# Patient Record
Sex: Male | Born: 1940
Health system: Southern US, Community
[De-identification: ages and names within clinical notes are randomized; demographics above are authoritative.]

## PROBLEM LIST (undated history)

## (undated) DIAGNOSIS — I4892 Unspecified atrial flutter: Secondary | ICD-10-CM

## (undated) DIAGNOSIS — I2510 Atherosclerotic heart disease of native coronary artery without angina pectoris: Secondary | ICD-10-CM

## (undated) DIAGNOSIS — K219 Gastro-esophageal reflux disease without esophagitis: Secondary | ICD-10-CM

## (undated) DIAGNOSIS — E785 Hyperlipidemia, unspecified: Secondary | ICD-10-CM

## (undated) DIAGNOSIS — I10 Essential (primary) hypertension: Secondary | ICD-10-CM

## (undated) DIAGNOSIS — R42 Dizziness and giddiness: Secondary | ICD-10-CM

## (undated) DIAGNOSIS — I219 Acute myocardial infarction, unspecified: Secondary | ICD-10-CM

## (undated) DIAGNOSIS — I5022 Chronic systolic (congestive) heart failure: Secondary | ICD-10-CM

## (undated) DIAGNOSIS — I441 Atrioventricular block, second degree: Secondary | ICD-10-CM

## (undated) DIAGNOSIS — E119 Type 2 diabetes mellitus without complications: Secondary | ICD-10-CM

## (undated) DIAGNOSIS — I4891 Unspecified atrial fibrillation: Secondary | ICD-10-CM

## (undated) DIAGNOSIS — Z950 Presence of cardiac pacemaker: Secondary | ICD-10-CM

## (undated) DIAGNOSIS — Z86010 Personal history of colonic polyps: Secondary | ICD-10-CM

## (undated) DIAGNOSIS — M1990 Unspecified osteoarthritis, unspecified site: Secondary | ICD-10-CM

## (undated) HISTORY — DX: Personal history of colonic polyps: Z86.010

## (undated) HISTORY — DX: Type 2 diabetes mellitus without complications: E11.9

## (undated) HISTORY — DX: Unspecified atrial fibrillation: I48.91

## (undated) HISTORY — PX: CORONARY ARTERY BYPASS GRAFT: SHX141

## (undated) HISTORY — PX: COLONOSCOPY: SHX174

## (undated) HISTORY — DX: Presence of cardiac pacemaker: Z95.0

## (undated) HISTORY — DX: Unspecified atrial flutter: I48.92

---

## 1994-04-02 HISTORY — PX: OTHER SURGICAL HISTORY: SHX169

## 2003-06-10 ENCOUNTER — Ambulatory Visit (HOSPITAL_COMMUNITY): Admission: RE | Admit: 2003-06-10 | Discharge: 2003-06-10 | Payer: Self-pay | Admitting: Family Medicine

## 2003-06-24 ENCOUNTER — Ambulatory Visit (HOSPITAL_COMMUNITY): Admission: RE | Admit: 2003-06-24 | Discharge: 2003-06-25 | Payer: Self-pay | Admitting: Neurological Surgery

## 2004-03-14 ENCOUNTER — Ambulatory Visit: Payer: Self-pay | Admitting: Family Medicine

## 2004-04-02 HISTORY — PX: LUMBAR SPINE SURGERY: SHX701

## 2004-05-09 ENCOUNTER — Ambulatory Visit: Payer: Self-pay | Admitting: Family Medicine

## 2004-08-30 ENCOUNTER — Ambulatory Visit: Payer: Self-pay | Admitting: Family Medicine

## 2004-09-25 ENCOUNTER — Ambulatory Visit: Payer: Self-pay | Admitting: Family Medicine

## 2004-10-24 ENCOUNTER — Ambulatory Visit: Payer: Self-pay | Admitting: Family Medicine

## 2004-11-08 ENCOUNTER — Ambulatory Visit: Payer: Self-pay | Admitting: Family Medicine

## 2004-11-22 ENCOUNTER — Ambulatory Visit: Payer: Self-pay | Admitting: Family Medicine

## 2005-02-01 ENCOUNTER — Ambulatory Visit: Payer: Self-pay | Admitting: Internal Medicine

## 2005-02-01 ENCOUNTER — Ambulatory Visit (HOSPITAL_COMMUNITY): Admission: RE | Admit: 2005-02-01 | Discharge: 2005-02-01 | Payer: Self-pay | Admitting: Internal Medicine

## 2005-02-01 ENCOUNTER — Encounter: Payer: Self-pay | Admitting: Internal Medicine

## 2005-02-09 ENCOUNTER — Ambulatory Visit: Payer: Self-pay | Admitting: Family Medicine

## 2005-02-28 ENCOUNTER — Ambulatory Visit: Payer: Self-pay | Admitting: Family Medicine

## 2005-08-02 ENCOUNTER — Ambulatory Visit: Payer: Self-pay | Admitting: Family Medicine

## 2005-11-22 ENCOUNTER — Ambulatory Visit: Payer: Self-pay | Admitting: Family Medicine

## 2005-11-26 ENCOUNTER — Ambulatory Visit: Payer: Self-pay | Admitting: Family Medicine

## 2006-01-26 ENCOUNTER — Ambulatory Visit: Payer: Self-pay | Admitting: Family Medicine

## 2006-02-13 ENCOUNTER — Ambulatory Visit: Payer: Self-pay | Admitting: Physician Assistant

## 2006-06-20 ENCOUNTER — Ambulatory Visit: Payer: Self-pay | Admitting: Family Medicine

## 2007-01-12 ENCOUNTER — Observation Stay (HOSPITAL_COMMUNITY): Admission: EM | Admit: 2007-01-12 | Discharge: 2007-01-12 | Payer: Self-pay | Admitting: Emergency Medicine

## 2007-01-12 ENCOUNTER — Ambulatory Visit: Payer: Self-pay | Admitting: *Deleted

## 2008-04-02 HISTORY — PX: INGUINAL HERNIA REPAIR: SHX194

## 2008-04-02 HISTORY — PX: CATARACT EXTRACTION W/ INTRAOCULAR LENS  IMPLANT, BILATERAL: SHX1307

## 2008-06-30 ENCOUNTER — Ambulatory Visit (HOSPITAL_COMMUNITY): Admission: RE | Admit: 2008-06-30 | Discharge: 2008-06-30 | Payer: Self-pay | Admitting: Ophthalmology

## 2008-07-29 ENCOUNTER — Ambulatory Visit (HOSPITAL_COMMUNITY): Admission: RE | Admit: 2008-07-29 | Discharge: 2008-07-29 | Payer: Self-pay | Admitting: Ophthalmology

## 2009-01-27 ENCOUNTER — Encounter: Admission: RE | Admit: 2009-01-27 | Discharge: 2009-01-27 | Payer: Self-pay | Admitting: Surgery

## 2009-02-01 ENCOUNTER — Ambulatory Visit (HOSPITAL_BASED_OUTPATIENT_CLINIC_OR_DEPARTMENT_OTHER): Admission: RE | Admit: 2009-02-01 | Discharge: 2009-02-01 | Payer: Self-pay | Admitting: Surgery

## 2009-02-01 ENCOUNTER — Encounter (INDEPENDENT_AMBULATORY_CARE_PROVIDER_SITE_OTHER): Payer: Self-pay | Admitting: Surgery

## 2010-07-05 LAB — POCT HEMOGLOBIN-HEMACUE: Hemoglobin: 16.3 g/dL (ref 13.0–17.0)

## 2010-07-06 LAB — BASIC METABOLIC PANEL
BUN: 18 mg/dL (ref 6–23)
Chloride: 108 mEq/L (ref 96–112)
GFR calc non Af Amer: >60 mL/min (ref >60)
Potassium: 4.2 mEq/L (ref 3.5–5.1)
Sodium: 141 mEq/L (ref 135–145)

## 2010-07-13 LAB — HEMOGLOBIN AND HEMATOCRIT, BLOOD
HCT: 45.3 % (ref 39.0–52.0)
Hemoglobin: 15.8 g/dL (ref 13.0–17.0)

## 2010-07-13 LAB — BASIC METABOLIC PANEL
CO2: 28 mEq/L (ref 19–32)
Chloride: 101 mEq/L (ref 96–112)
Creatinine, Ser: 1.05 mg/dL (ref 0.4–1.5)
GFR calc Af Amer: >60 mL/min (ref >60)
Potassium: 4.4 mEq/L (ref 3.5–5.1)

## 2010-08-15 NOTE — H&P (Signed)
[name], Hunt                 ACCOUNT NO.:  [id number]   MEDICAL RECORD NO.:  [id number]          PATIENT TYPE:  EMS   LOCATION:  MAJO                         FACILITY:  [facility]   PHYSICIAN:  Unice Cobble, MD     DATE OF BIRTH:  1940-06-30   DATE OF ADMISSION:  01/12/2007  DATE OF DISCHARGE:                              HISTORY & PHYSICAL   PRIMARY CARE PHYSICIAN:  Delaney Meigs, M.D.   CHIEF COMPLAINT:  Nausea.   HISTORY OF PRESENT ILLNESS:  This is a 70 year old white male with a  history of CABG who presents with nausea.  The patient had an onset of  nausea two hours after eating two hot dogs with lunch.  No symptoms of  chest pain, shortness of breath, presyncope, edema, PND, or orthopnea.  His nausea persisted throughout the day.  Prior to his bypass surgery in  1996, he had similar symptoms of nausea for three days straight.  He was  concerned that this was a similar presentation, and went to the  emergency room.  At the outside hospital, his blood pressure was 191/101  (he had missed his evening medications secondary to his stay in the  emergency room).  His blood pressure at home usually runs in the  160s/90s.  According to the physician at the outside hospital emergency  department, the patient asked for transfer to the [facility]  as this is where he had his bypass surgery done.   PAST MEDICAL HISTORY:  1. Hypertension.  2. Hyperlipidemia.  3. Bypass surgery in 1996 at [facility]  4. Back surgery in 2002.   ALLERGIES:  No known drug allergies.   MEDICATIONS:  1. Amlodipine 5 mg daily.  2. Bisoprolol/hydrochlorothiazide 2.5/6.25 mg daily.  3. Simvastatin 80 mg daily.  4. Aspirin 325 mg daily  5. Fish oil 1000 mg two tablets daily.   SOCIAL HISTORY:  He lives in [place] with his wife.  He is a  Scientist, product/process development.  He quit smoking in 1996.  No alcohol or drugs.  He does  not notice any limitation to his exercise capacity.   FAMILY  HISTORY:  His mother had congestive heart failure.  His father  had a stroke and died of a heart attack. One of is siblings has heart  valve problem.   REVIEW OF SYSTEMS:  He had some mild sweating earlier today while he was  hot in the car.  He has had mild headaches.  He has a cough.  He has  frequency of urine.  Multiple arthralgias and some mild abdominal  cramping, but no frank pain.  Otherwise, his review of systems was found  to be negative except as stated in the HPI.   PHYSICAL EXAMINATION:  VITAL SIGNS:  Afebrile.  Pulse 78, blood pressure  148/89.  He is satting 99% on 2 L.  GENERAL:  He is an overweight white male in no acute distress.  HEENT:  PERRLA. EOMI.  Poor dentition. Oropharynx is without erythema or  exudates.  NECK:  Supple without lymphadenopathy, thyromegaly,  bruits, or jugular  venous distension.  HEART:  Regular rate and rhythm with a normal S1 and S2.  No murmurs,  gallops or rubs.  PMI is nondisplaced.  Pulses are 2+ and equal without  bruits.  LUNGS:  Clear to auscultation bilaterally.  SKIN:  No rashes.  ABDOMEN:  Soft and nontender with normal bowel sounds.  No rebound or  guarding.  Negative hepatosplenomegaly or masses.  EXTREMITIES:  No cyanosis, clubbing or edema.  His feet are cool  bilaterally with excellent pulses.  NEUROLOGIC:  He is alert and oriented x3.  Cranial nerves II-XII grossly  intact.  Strength is 5/5 in all extremities and muscle groups.  He had  normal sensation throughout.  EKG:  Shows a sinus bradycardia with a rate of 51 and a bifascicular  block and left anterior fascicular block.  He has left ventricular  hypertrophy with inferior and lateral T wave inversions.  His T wave  inversions are slightly worse than prior EKGs.   LABORATORY DATA:  White count 6.5 with a hemoglobin of 16.8, platelet  count 165.  His basic metabolic panel has been remarkable with a  creatinine of 0.9.  His troponin is less than 0.05 and CK-MB is  1.6.   ASSESSMENT/PLAN:  This is a 70 year old white male with a history of  coronary artery bypass grafting and presenting with nausea.  This is a  nonspecific symptom but similar to prior to his bypass surgery with  minimal EKG differences.  I will rule out myocardial infarction tonight  with serial enzyme and serial EKGs and if negative, plan on performing  his stress test as an outpatient.  He will be treated for nausea with  Zofran, a GI cocktail and Protonix.  Otherwise, I will continue his home  medications with an increase in his Norvasc for antihypertensive effect.      Unice Cobble, MD  Electronically Signed     ACJ/MEDQ  D:  01/12/2007  T:  01/12/2007  Job:  [id number]

## 2010-08-15 NOTE — Discharge Summary (Signed)
Ricardo Hunt, Ricardo Hunt                 ACCOUNT NO.:  [id number]   MEDICAL RECORD NO.:  [id number]          PATIENT TYPE:  INP   LOCATION:  2032                         FACILITY:  [facility]   PHYSICIAN:  Bevelyn Buckles. Bensimhon, MDDATE OF BIRTH:  1941-03-23   DATE OF ADMISSION:  01/12/2007  DATE OF DISCHARGE:  01/12/2007                         DISCHARGE SUMMARY - REFERRING   DISCHARGE DIAGNOSES:  1. Nausea, questionable angina equivalent.  2. History of bypass surgery 1996, specifics unknown.  3. Hypertension.   Patient is new to our cardiology practice, will be Dr. Gala Romney.   SUMMARY OF HISTORY:  [name] is a 70 year old white male with a history  of bypass surgery who presented with nausea.  This occurred  approximately 2 hours after eating two hotdogs at lunch.  He did not  have any associated chest discomfort, shortness of breath, presyncope,  edema, PND or orthopnea.  His nausea persisted.  He presented to the  emergency room, given his history of 3 days of nausea prior to his  bypass surgery in 1996.  He was initially seen at an outside hospital  and was found to be hypertensive at 191/101.  He had not taken his  evening medications secondary to emergency room visit.  Apparently, the  patient asked for transfer to [facility] as this is where he  had his surgery performed.   His history is notable for hypertension, hyperlipidemia, bypass surgery  in 1996, back surgery in 2002.  Remote tobacco use.   LABORATORY DATA:  Admission H&H was 15.9 and 46.2, normal indices,  platelets 165,000, WBC 6.7, PTT 27, PT 13.2.  Sodium 140, potassium 3.9,  BUN 19, creatinine 0.89, normal LFTs.  CK MBs, relative indexes and  troponins were within normal limits x2.  Fasting lipids showed a total  cholesterol of 170, triglycerides 250, HDL 30, LDL 90.  EKG shows sinus  bradycardia, left axis deviation, left anterior hemiblock, right bundle  branch block.   HOSPITAL COURSE:  The patient was  initially seen in the emergency room  by Dr. Laural Benes.  He was admitted for further evaluation.  By late the  afternoon of the 12th the patient had not had any further nausea, he  denied any chest discomfort and shortness of breath, enzymes were  negative for myocardial infarction.  Dr. Gala Romney felt that his EKG  showed sinus rhythm with inferolateral ST segment abnormality slightly  more prominent than prior EKGs.  He felt that he could be discharged  home with outpatient Myoview this week.   DISPOSITION:  1. Patient is discharged home.  2. Activities and wound care are not applicable.  3. Our office will call him with arrangements for a stress Myoview and      followup appointment.  4. Will follow with Dr. Lysbeth Galas, in [place], as needed.  5. He was given a new prescription for nitroglycerin.  6. He was asked to continue his home medications, which include:      a.     Norvasc 5 mg daily.      b.     Bisoprolol/HCTZ 2.5/6.25  mg daily.      c.     Simvastatin 80 mg nightly.      d.     Aspirin 325 mg daily.      e.     Fish Oil as previously.  7. He is asked to bring all medications to all followup appointments.   DISCHARGE TIME:  35 minutes.      Joellyn Rued, PA-C      Bevelyn Buckles. Bensimhon, MD  Electronically Signed    EW/MEDQ  D:  01/12/2007  T:  01/12/2007  Job:  [id number]   cc:   Delaney Meigs, M.D.  Bevelyn Buckles. Bensimhon, MD

## 2010-08-18 NOTE — Op Note (Signed)
NAME:  Ricardo Hunt, Ricardo Hunt                           ACCOUNT NO.:  [id number]   MEDICAL RECORD NO.:  [id number]                   PATIENT TYPE:  OIB   LOCATION:  3013                                 FACILITY:  [facility]   PHYSICIAN:  Tia Alert, MD                  DATE OF BIRTH:  05/13/1940   DATE OF PROCEDURE:  06/24/2003  DATE OF DISCHARGE:                                 OPERATIVE REPORT   PREOPERATIVE DIAGNOSIS:  Lumbar disk herniation, L3-4, with right L4  radiculopathy and spinal stenosis.   POSTOPERATIVE DIAGNOSIS:  Lumbar disk herniation, L3-4, with right al4  radiculopathy and spinal stenosis.   PROCEDURE:  Lumbar hemilaminectomy, medial facetectomy and foraminotomy, L3-  4 on the right, with microdiskectomy, L3-4 on the right utilizing  microscopic dissection.   SURGEON:  Tia Alert, MD   ASSISTANT:  Reinaldo Meeker, M.D.   ANESTHESIA:  General endotracheal.   COMPLICATIONS:  None apparent.   INDICATIONS FOR PROCEDURE:  Mr. Schoffstall is a 70 year old white male who  presented to the neurosurgery clinic with complaints of intractable right  leg pain in an L4 distribution that went down the anterior thigh into the  shin.  He had tried medical management for quite some time.  He had an MRI  which showed a large herniated disk at L3-4, causing significant central  canal stenosis.  I recommended a lumbar decompression via microdiskectomy at  L3-4 on the right.  He understood the risks, benefits and alternatives and  wished to proceed.   DESCRIPTION OF PROCEDURE:  The patient was taken to the operating room and  after induction of general endotracheal anesthesia, he was rolled in the  prone position on the Wilson frame and all pressure points were padded.  His  lumbar region was prepped with DuraPrep and then draped in the usual sterile  fashion.  1 cc of local anesthesia was injected and then a dorsal midline  incision was made and carried down to the lumbosacral fascia.  The fascia was  opened and the paraspinous musculature was taken down in a subperiosteal  fashion to expose the L3-4 interspace on the right side.  Intraoperative x-  ray confirmed level and then a hemilaminectomy, medial facetectomy and  foraminotomy was performed at L3-4 on the right utilizing high speed  Biomedics drill and the Kerrison punch.  The ligament was opened and removed  to expose the underlying dura and L4 nerve root.  I dissected out to the  medial pedicle wall and followed the nerve root out into the foramen.  I  then was able to identify the disk space.  I coagulated the epidural venous  vasculature and cut this sharply, then brought in the operating microscope  and the procedure was done under the operating microscope utilizing  microscopic dissection to perform a thorough intradiskal diskectomy.  The  annulus was incised and  the diskectomy was performed with the pituitary  rongeurs.  He had a large midline free fragment, which was removed with the  pituitary rongeur.  We then palpated with nerve hooks to assure no more free  fragments or compressive lesions.  The annulus was sunken and the nerve root  was free.  We then irrigated with a copious amount of Bacitracin containing  saline solution, removed the retractor, dried all bleeding points and then  closed the fascia with interrupted #1 Vicryl.  We closed the subcuticular  tissue with 2-0  and 3-0 Vicryl, and closed the skin with Dermabond.  The drapes were  removed.  The patient was awakened from general anesthesia and transported  to the recovery room in stable condition.  At the end of the procedure, all  sponge, needle and instrument counts were correct.                                               Tia Alert, MD    DSJ/MEDQ  D:  06/24/2003  T:  06/25/2003  Job:  [id number]

## 2010-08-18 NOTE — Op Note (Signed)
NAME:  Ricardo Hunt, Ricardo Hunt                 ACCOUNT NO.:  [id number]   MEDICAL RECORD NO.:  [id number]          PATIENT TYPE:  AMB   LOCATION:  DAY                           FACILITY:  [facility]   PHYSICIAN:  R. Roetta Sessions, M.D. DATE OF BIRTH:  [birth year]   DATE OF PROCEDURE:  02/01/2005  DATE OF DISCHARGE:                                 OPERATIVE REPORT   PROCEDURE:  Colonoscopy with ileoscopy and segmental biopsy and stool  sample.   INDICATIONS FOR PROCEDURE:  The patient is a 70 year old gentleman referred  at the courtesy of Dr. Joette Catching for colorectal cancer screening. It  sounds like he had a sigmoidoscopy in Dr. Joyce Copa office several years ago  without significant findings. He never had his entire lower GI tract  evaluated. There is no family history of colorectal neoplasia. [name] does  have a complaint of having three to four loose stools daily without ever  having a formed stool he states over the past year or so. It has worsened a  few years back and before he recalls generally having one formed bowel  movement daily. He has not passed any blood per rectum. He has not lost any  weight. He has not had any abdominal pain. Colonoscopy is now being done in  part for screening, in part to further evaluate the above mentioned bowel  symptoms. This approach has been discussed with the patient at length.  Potential risks, benefits, and alternatives have been reviewed and questions  answered. He is agreeable. Please see documentation in the medical record.   PROCEDURE NOTE:  O2 saturation, blood pressure, pulse, and respirations were  monitored throughout the entire procedure. Conscious sedation with Versed 2  mg IV and Demerol 50 mg IV.   INSTRUMENT:  Olympus video chip system.   FINDINGS:  Digital rectal exam revealed no abnormalities.   ENDOSCOPIC FINDINGS:  Prep was adequate.   Rectum:  Examination of the rectal mucosa including retroflexed view of the  anal verge  revealed no abnormalities.   Colon:  Colonic mucosa was surveyed from the rectosigmoid junction through  the left, transverse, and right colon to the area of the appendiceal  orifice, ileocecal valve, and cecum. These structures were well seen and  photographed for the record. The terminal ileum was intubated to 10 cm. From  this level, the scope was slowly withdrawn, and all previously mentioned  mucosal surfaces were again seen. The patient was noted to have left sided  diverticula. The remainder of the colonic mucosa appeared normal. Distal 10  cm of the terminal ileum appeared entirely normal. Segmental biopsies of the  sigmoid colon and rectal mucosa were taken for histologic study. Also, stool  residue was suctioned out for microbiology studies. The patient tolerated  the procedure well and was reactive to endoscopy.   IMPRESSION:  Normal rectum. Left sided diverticula. The remainder of colonic  mucosa and terminal ileum appeared normal.   RECOMMENDATIONS:  1.  Will follow up on biopsies and check for microscopic colitis and will      follow up on stools  studies from today.  2.  Diverticulosis literature provided to Mr. Dorner.  3.  Further recommendations to follow.      Jonathon Bellows, M.D.  Electronically Signed     RMR/MEDQ  D:  02/01/2005  T:  02/01/2005  Job:  [id number]   cc:   Delaney Meigs, M.D.  Fax: [phone]

## 2011-01-11 LAB — DIFFERENTIAL
Basophils Relative: 0
Eosinophils Relative: 3
Monocytes Absolute: 0.4
Monocytes Relative: 6
Neutro Abs: 5.1

## 2011-01-11 LAB — COMPREHENSIVE METABOLIC PANEL
ALT: 20
AST: 19
Albumin: 3.7
Alkaline Phosphatase: 49
BUN: 19
Chloride: 105
GFR calc Af Amer: >60
Potassium: 3.9
Sodium: 140
Total Bilirubin: 0.7
Total Protein: 6.1

## 2011-01-11 LAB — CBC
HCT: 46.2
Hemoglobin: 15.9
RBC: 4.84
WBC: 6.7

## 2011-01-11 LAB — LIPID PANEL
LDL Cholesterol: 90
VLDL: 50 — ABNORMAL HIGH

## 2011-01-11 LAB — CK TOTAL AND CKMB (NOT AT ARMC)
CK, MB: 1.8
CK, MB: 2
Total CK: 59
Total CK: 64

## 2011-01-11 LAB — APTT: aPTT: 27

## 2011-01-11 LAB — TROPONIN I: Troponin I: 0.01

## 2012-10-03 ENCOUNTER — Emergency Department (HOSPITAL_COMMUNITY): Payer: Medicare Other

## 2012-10-03 ENCOUNTER — Encounter (HOSPITAL_COMMUNITY): Payer: Self-pay | Admitting: *Deleted

## 2012-10-03 ENCOUNTER — Observation Stay (HOSPITAL_COMMUNITY)
Admission: EM | Admit: 2012-10-03 | Discharge: 2012-10-06 | Disposition: A | Discharge status: Home / Self Care | Payer: Medicare Other | Attending: Internal Medicine | Admitting: Internal Medicine

## 2012-10-03 DIAGNOSIS — I452 Bifascicular block: Secondary | ICD-10-CM | POA: Insufficient documentation

## 2012-10-03 DIAGNOSIS — E860 Dehydration: Secondary | ICD-10-CM | POA: Diagnosis present

## 2012-10-03 DIAGNOSIS — R55 Syncope and collapse: Principal | ICD-10-CM | POA: Insufficient documentation

## 2012-10-03 DIAGNOSIS — R799 Abnormal finding of blood chemistry, unspecified: Secondary | ICD-10-CM

## 2012-10-03 DIAGNOSIS — S2239XA Fracture of one rib, unspecified side, initial encounter for closed fracture: Secondary | ICD-10-CM | POA: Insufficient documentation

## 2012-10-03 DIAGNOSIS — I10 Essential (primary) hypertension: Secondary | ICD-10-CM | POA: Insufficient documentation

## 2012-10-03 DIAGNOSIS — R7309 Other abnormal glucose: Secondary | ICD-10-CM | POA: Insufficient documentation

## 2012-10-03 DIAGNOSIS — R739 Hyperglycemia, unspecified: Secondary | ICD-10-CM

## 2012-10-03 DIAGNOSIS — I2510 Atherosclerotic heart disease of native coronary artery without angina pectoris: Secondary | ICD-10-CM | POA: Insufficient documentation

## 2012-10-03 DIAGNOSIS — R222 Localized swelling, mass and lump, trunk: Secondary | ICD-10-CM

## 2012-10-03 DIAGNOSIS — R918 Other nonspecific abnormal finding of lung field: Secondary | ICD-10-CM | POA: Diagnosis present

## 2012-10-03 DIAGNOSIS — E785 Hyperlipidemia, unspecified: Secondary | ICD-10-CM | POA: Insufficient documentation

## 2012-10-03 DIAGNOSIS — S2232XD Fracture of one rib, left side, subsequent encounter for fracture with routine healing: Secondary | ICD-10-CM

## 2012-10-03 DIAGNOSIS — I25119 Atherosclerotic heart disease of native coronary artery with unspecified angina pectoris: Secondary | ICD-10-CM | POA: Diagnosis present

## 2012-10-03 DIAGNOSIS — D696 Thrombocytopenia, unspecified: Secondary | ICD-10-CM | POA: Diagnosis present

## 2012-10-03 DIAGNOSIS — Z951 Presence of aortocoronary bypass graft: Secondary | ICD-10-CM | POA: Insufficient documentation

## 2012-10-03 DIAGNOSIS — J9859 Other diseases of mediastinum, not elsewhere classified: Secondary | ICD-10-CM

## 2012-10-03 DIAGNOSIS — W19XXXA Unspecified fall, initial encounter: Secondary | ICD-10-CM | POA: Insufficient documentation

## 2012-10-03 DIAGNOSIS — I4460 Unspecified fascicular block: Secondary | ICD-10-CM | POA: Insufficient documentation

## 2012-10-03 HISTORY — DX: Hyperlipidemia, unspecified: E78.5

## 2012-10-03 HISTORY — DX: Essential (primary) hypertension: I10

## 2012-10-03 HISTORY — DX: Thrombocytopenia, unspecified: D69.6

## 2012-10-03 HISTORY — DX: Dizziness and giddiness: R42

## 2012-10-03 LAB — CBC WITH DIFFERENTIAL/PLATELET
HCT: 44.4 % (ref 39.0–52.0)
Hemoglobin: 15.5 g/dL (ref 13.0–17.0)
Lymphocytes Relative: 8 % — ABNORMAL LOW (ref 12–46)
MCHC: 34.9 g/dL (ref 30.0–36.0)
MCV: 93.1 fL (ref 78.0–100.0)
Monocytes Absolute: 0.3 10*3/uL (ref 0.1–1.0)
Monocytes Relative: 4 % (ref 3–12)
Neutro Abs: 6.8 10*3/uL (ref 1.7–7.7)
WBC: 7.9 10*3/uL (ref 4.0–10.5)

## 2012-10-03 LAB — BASIC METABOLIC PANEL
BUN: 28 mg/dL — ABNORMAL HIGH (ref 6–23)
CO2: 27 mEq/L (ref 19–32)
Chloride: 104 mEq/L (ref 96–112)
Creatinine, Ser: 0.98 mg/dL (ref 0.50–1.35)
Glucose, Bld: 183 mg/dL — ABNORMAL HIGH (ref 70–99)

## 2012-10-03 MED ORDER — ACETAMINOPHEN 325 MG PO TABS: 650.0000 mg | ORAL_TABLET | Freq: Four times a day (QID) | ORAL | Status: DC | PRN

## 2012-10-03 MED ORDER — ATORVASTATIN CALCIUM 40 MG PO TABS
40.0000 mg | ORAL_TABLET | Freq: Every day | ORAL | Status: DC
  Administered 2012-10-04 – 2012-10-05 (×2): 40 mg via ORAL
  Filled 2012-10-03 (×3): qty 1

## 2012-10-03 MED ORDER — SODIUM CHLORIDE 0.9 % IJ SOLN: 3.0000 mL | Freq: Two times a day (BID) | INTRAMUSCULAR | Status: DC

## 2012-10-03 MED ORDER — AMLODIPINE BESYLATE 5 MG PO TABS
5.0000 mg | ORAL_TABLET | Freq: Every day | ORAL | Status: DC
  Administered 2012-10-03: 5 mg via ORAL
  Filled 2012-10-03 (×2): qty 1

## 2012-10-03 MED ORDER — ENOXAPARIN SODIUM 40 MG/0.4ML ~~LOC~~ SOLN: 40.0000 mg | SUBCUTANEOUS | Status: DC

## 2012-10-03 MED ORDER — ALUM & MAG HYDROXIDE-SIMETH 200-200-20 MG/5ML PO SUSP: 30.0000 mL | Freq: Four times a day (QID) | ORAL | Status: DC | PRN

## 2012-10-03 MED ORDER — IRBESARTAN 150 MG PO TABS
150.0000 mg | ORAL_TABLET | Freq: Every day | ORAL | Status: DC
  Administered 2012-10-03: 150 mg via ORAL
  Filled 2012-10-03 (×2): qty 1

## 2012-10-03 MED ORDER — OXYCODONE HCL 5 MG PO TABS
5.0000 mg | ORAL_TABLET | ORAL | Status: DC | PRN
  Administered 2012-10-03 – 2012-10-06 (×3): 5 mg via ORAL
  Filled 2012-10-03 (×3): qty 1

## 2012-10-03 MED ORDER — HYDRALAZINE HCL 20 MG/ML IJ SOLN
10.0000 mg | Freq: Four times a day (QID) | INTRAMUSCULAR | Status: DC | PRN
  Administered 2012-10-04: 10 mg via INTRAVENOUS
  Filled 2012-10-03: qty 1

## 2012-10-03 MED ORDER — ACETAMINOPHEN 650 MG RE SUPP: 650.0000 mg | Freq: Four times a day (QID) | RECTAL | Status: DC | PRN

## 2012-10-03 MED ORDER — SODIUM CHLORIDE 0.9 % IV SOLN
INTRAVENOUS | Status: DC
  Administered 2012-10-03: 75 mL/h via INTRAVENOUS
  Administered 2012-10-05: 10:00:00 via INTRAVENOUS
  Administered 2012-10-05: 75 mL/h via INTRAVENOUS

## 2012-10-03 MED ORDER — HYDROMORPHONE HCL PF 1 MG/ML IJ SOLN
0.5000 mg | INTRAMUSCULAR | Status: DC | PRN
  Administered 2012-10-04 – 2012-10-06 (×6): 1 mg via INTRAVENOUS
  Filled 2012-10-03 (×6): qty 1

## 2012-10-03 MED ORDER — AMLODIPINE-OLMESARTAN 5-20 MG PO TABS: 1.0000 | ORAL_TABLET | Freq: Every day | ORAL | Status: DC

## 2012-10-03 MED ORDER — ONDANSETRON HCL 4 MG/2ML IJ SOLN
4.0000 mg | Freq: Four times a day (QID) | INTRAMUSCULAR | Status: DC | PRN
  Administered 2012-10-04 – 2012-10-06 (×2): 4 mg via INTRAVENOUS
  Filled 2012-10-03 (×2): qty 2

## 2012-10-03 MED ORDER — IOHEXOL 300 MG/ML  SOLN
75.0000 mL | Freq: Once | INTRAMUSCULAR | Status: AC | PRN
  Administered 2012-10-03: 75 mL via INTRAVENOUS

## 2012-10-03 MED ORDER — ZOLPIDEM TARTRATE 5 MG PO TABS: 5.0000 mg | ORAL_TABLET | Freq: Every evening | ORAL | Status: DC | PRN

## 2012-10-03 MED ORDER — ASPIRIN EC 81 MG PO TBEC
81.0000 mg | DELAYED_RELEASE_TABLET | Freq: Every day | ORAL | Status: DC
  Administered 2012-10-04 – 2012-10-05 (×2): 81 mg via ORAL
  Filled 2012-10-03 (×3): qty 1

## 2012-10-03 MED ORDER — OMEGA-3-ACID ETHYL ESTERS 1 G PO CAPS
2.0000 g | ORAL_CAPSULE | Freq: Every day | ORAL | Status: DC
  Administered 2012-10-04 – 2012-10-05 (×2): 2 g via ORAL
  Filled 2012-10-03 (×3): qty 2

## 2012-10-03 MED ORDER — ONDANSETRON HCL 4 MG PO TABS: 4.0000 mg | ORAL_TABLET | Freq: Four times a day (QID) | ORAL | Status: DC | PRN

## 2012-10-03 NOTE — ED Notes (Signed)
Patient was at work (works inside) and felt dizzy.  Co workers stated that he had some LOC.  Upon EMS arrival placed on the monitor and found him to be in 3rd degree heart block.  Patient was alert at that time.  Stated that he became dizzy while coming down the hall into the room

## 2012-10-03 NOTE — ED Notes (Signed)
Attempted to call report x 1. Was told the receiving nurse will call back in 5 minutes.

## 2012-10-03 NOTE — ED Provider Notes (Signed)
History    CSN: [id number] Arrival date & time 10/03/12  1911  First MD Initiated Contact with Patient 10/03/12 1927     Chief Complaint  Patient presents with  . Loss of Consciousness   (Consider location/radiation/quality/duration/timing/severity/associated sxs/prior Treatment) Patient is a 72 y.o. male presenting with syncope. The history is provided by the patient.  Loss of Consciousness He was at work when he started to feel dizzy and passed out. He states that he was very sweaty but denies chest pain, heaviness, tightness, pressure. He denies nausea or vomiting. He denies dyspnea. He was brought in by EMS who reported that he was initially in complete heart block. He felt better shortly after EMS arrived but did have recurrence of dizziness as he was being wheeled into his room in the ED. He does have history of coronary artery bypass surgery and he is on medications for hypertension and hyperlipidemia. History reviewed. No pertinent past medical history. History reviewed. No pertinent past surgical history. No family history on file. History  Substance Use Topics  . Smoking status: Not on file  . Smokeless tobacco: Not on file  . Alcohol Use: Not on file    Review of Systems  Cardiovascular: Positive for syncope.  All other systems reviewed and are negative.    Allergies  Review of patient's allergies indicates not on file.  Home Medications  No current outpatient prescriptions on file. BP 176/75  Pulse 78  Temp(Src) 98.5 F (36.9 C) (Oral)  Resp 18  Ht 5\' 8"  (1.727 m)  Wt 192 lb (87.091 kg)  BMI 29.2 kg/m2  SpO2 97% Physical Exam  Nursing note and vitals reviewed.  72 year old male, resting comfortably and in no acute distress. Vital signs are significant for hypertension with blood pressure 176/75. Oxygen saturation is 97%, which is normal. Head is normocephalic and atraumatic. PERRLA, EOMI. Oropharynx is clear. Neck is nontender and supple without  adenopathy or JVD. There no carotid bruits.  Back is nontender and there is no CVA tenderness. Lungs are clear without rales, wheezes, or rhonchi. Chest is nontender. Heart has regular rate and rhythm without murmur. Abdomen is soft, flat, nontender without masses or hepatosplenomegaly and peristalsis is normoactive. Extremities have no cyanosis or edema, full range of motion is present. Skin is warm and dry without rash. Neurologic: Mental status is normal, cranial nerves are intact, there are no motor or sensory deficits.  ED Course  Procedures (including critical care time) Results for orders placed during the hospital encounter of 10/03/12  CBC WITH DIFFERENTIAL      Result Value Range   WBC 7.9  4.0 - 10.5 K/uL   RBC 4.77  4.22 - 5.81 MIL/uL   Hemoglobin 15.5  13.0 - 17.0 g/dL   HCT 78.2  95.6 - 21.3 %   MCV 93.1  78.0 - 100.0 fL   MCH 32.5  26.0 - 34.0 pg   MCHC 34.9  30.0 - 36.0 g/dL   RDW 08.6  57.8 - 46.9 %   Platelets 127 (*) 150 - 400 K/uL   Neutrophils Relative % 86 (*) 43 - 77 %   Neutro Abs 6.8  1.7 - 7.7 K/uL   Lymphocytes Relative 8 (*) 12 - 46 %   Lymphs Abs 0.7  0.7 - 4.0 K/uL   Monocytes Relative 4  3 - 12 %   Monocytes Absolute 0.3  0.1 - 1.0 K/uL   Eosinophils Relative 1  0 - 5 %  Eosinophils Absolute 0.1  0.0 - 0.7 K/uL   Basophils Relative 0  0 - 1 %   Basophils Absolute 0.0  0.0 - 0.1 K/uL  BASIC METABOLIC PANEL      Result Value Range   Sodium 138  135 - 145 mEq/L   Potassium 4.2  3.5 - 5.1 mEq/L   Chloride 104  96 - 112 mEq/L   CO2 27  19 - 32 mEq/L   Glucose, Bld 183 (*) 70 - 99 mg/dL   BUN 28 (*) 6 - 23 mg/dL   Creatinine, Ser 1.19  0.50 - 1.35 mg/dL   Calcium 8.8  8.4 - 14.7 mg/dL   GFR calc non Af Amer 80 (*) >90 mL/min   GFR calc Af Amer >90  >90 mL/min  TROPONIN I      Result Value Range   Troponin I <0.30  <0.30 ng/mL   Ct Chest W Contrast  10/03/2012   *RADIOLOGY REPORT*  Clinical Data:  With question medial right upper lobe versus  the right superior mediastinal mass  CT CHEST WITH CONTRAST  Technique:  Multidetector CT imaging of the chest was performed following the standard protocol during bolus administration of intravenous contrast. Sagittal and coronal MPR images reconstructed from axial data set.  Contrast: 75mL OMNIPAQUE IOHEXOL 300 MG/ML  SOLN  Comparison: Chest radiograph 10/03/2012  Findings: Postsurgical changes of CABG. Scattered atherosclerotic calcifications aorta at, coronary arteries and great vessels. Markedly tortuous innominate artery, accounting for abnormal soft tissue density at the right superior mediastinum. No evidence of mediastinal mass or adenopathy. Visualized portion of upper abdomen significant only for a tiny calcified gallstone in the gallbladder. Lungs clear with no evidence of pulmonary mass or infiltrate. No pleural effusion or pneumothorax. Osseous structures unremarkable.  IMPRESSION: No evidence of right upper lobe mass or mediastinal mass/adenopathy. Abnormality identified on chest radiography at the medial right upper lobe/superior mediastinum is due to a markedly tortuous innominate artery. Cholelithiasis.   Original Report Authenticated By: Ulyses Southward, M.D.   Dg Chest Portable 1 View  10/03/2012   *RADIOLOGY REPORT*  Clinical Data:  Loss of consciousness, history hypertension  PORTABLE CHEST - 1 VIEW  Comparison: Portable exam 1949 hours compared to 01/27/2009  Findings: Upper normal heart size post median sternotomy. Atherosclerotic calcification aorta. Pulmonary vascularity normal. New medial right apical versus right superior mediastinal soft tissue mass approximately 5 cm diameter. No definite mass effect upon trachea. Remaining lungs clear. No pleural effusion or pneumothorax.  IMPRESSION: New abnormal soft tissue density at medial right apex question medial right upper lobe versus superior mediastinal mass; CT chest with contrast recommended for further evaluation.   Original Report  Authenticated By: Ulyses Southward, M.D.       Date: 10/03/2012  Rate: 77  Rhythm: normal sinus rhythm  QRS Axis: normal  Intervals: PR prolonged  ST/T Wave abnormalities: nonspecific T wave changes  Conduction Disutrbances:right bundle branch block and left anterior fascicular block  Narrative Interpretation: Right bundle branch block, left anterior fascicular block, left ventricular hypertrophy with secondary repolarization changes. When compared with ECG of 01/27/2009, no significant changes are seen.  Old EKG Reviewed: unchanged   1. Syncope   2. Mediastinal mass   3. Hypertension   4. Hyperglycemia   5. Elevated BUN   6. Lung mass   7. Dehydration   8. CAD (coronary artery disease)   9. Hyperlipidemia   10. Thrombocytopenia     MDM  Syncope with transient episode of third  degree AV block. I reviewed her ECGs from EMS and on 2 tracings there appeared to be third degree AV block but all other tracings were in the sinus. There is strips were not timed. However, he does have bifascicular block with left bundle branch block and left anterior fascicular block and would be at risk for developing complete heart block. He will need to be admitted for monitoring and consideration for cardiology consultation to see if he would be a candidate for a pacemaker.  Workup is significant for hyperglycemia and patient was not previously diagnosed with diabetes. Chest x-ray shows a probable mediastinal mass and CT has been ordered to further evaluate this. The mediastinal masses on the right and could conceivably cause intermittent obstruction of venous return of the superior vena cava. That could conceivably contribute to his syncope. Also, BUN is elevated with creatinine stable at indicating that he may be somewhat dehydrated. Case is discussed with Dr. Lovell Sheehan of [facility] who agrees to admit the patient.  CT chest shows no actual mass, only a tortuous artery.  Dione Booze, MD 10/03/12  [id number]

## 2012-10-03 NOTE — H&P (Addendum)
[facility] History and Physical  Adriana LABIB CWYNAR YQM:[id number] DOB: 28-Sep-1940 DOA: 10/03/2012  Referring physician: EDP PCP: Josue Hector, MD  Specialists:   Chief Complaint: Passed Out  HPI: Ricardo Hunt is a 72 y.o. male with a history of CAD and remote history of a CABG who presents to the ED after passing out at work.  He reports that he felt dizzy and all of a sudden blacked out.  He denies having any headache or chest pain.  His co-workers stated that he had been passed out for about 3 seconds.  He denies having any previous similar episodes.     Review of Systems: The patient denies anorexia, fever, chills, headaches, weight loss,, vision loss, diplopia, dizziness, decreased hearing, rhinitis, hoarseness, chest pain, syncope, dyspnea on exertion, peripheral edema, balance deficits, cough, hemoptysis, abdominal pain, nausea, vomiting, diarrhea, constipation, hematemesis, melena, hematochezia, severe indigestion/heartburn, dysuria, hematuria, incontinence, muscle weakness, suspicious skin lesions, transient blindness, difficulty walking, depression, unusual weight change, abnormal bleeding, enlarged lymph nodes, angioedema, and breast masses.    Past Medical History  Diagnosis Date  . Hypertension   . Vertigo   . Hyperlipidemia         CAD  Past Surgical History  Procedure Laterality Date  . Cardiac bypass    . Lumbar spine surgery    . Inguinal hernia repair      Left    Prior to Admission medications   Medication Sig Start Date End Date Taking? Authorizing Provider  amLODipine-olmesartan (AZOR) 5-20 MG per tablet Take 1 tablet by mouth at bedtime.   Yes Historical Provider, MD  aspirin EC 81 MG tablet Take 81 mg by mouth daily.   Yes Historical Provider, MD  ibuprofen (ADVIL,MOTRIN) 200 MG tablet Take 800 mg by mouth daily as needed for pain.   Yes Historical Provider, MD  Omega-3 Fatty Acids (FISH OIL) 1000 MG CAPS Take 2,000 mg by mouth daily.   Yes  Historical Provider, MD  rosuvastatin (CRESTOR) 20 MG tablet Take 20 mg by mouth at bedtime.   Yes Historical Provider, MD     No Known Allergies     Social History:  reports that he has quit smoking. He has never used smokeless tobacco. He reports that  drinks alcohol. He reports that he does not use illicit drugs.     Family History  Problem Relation Age of Onset  . CAD Father   . CAD Mother   . Hypertension Father   . Diabetes Mother   . Breast cancer      Niece     Physical Exam:  GEN:  Pleasant well nourished and well developed Elderly 72 y.o. Caucasian male  examined  and in no acute distress; cooperative with exam Filed Vitals:   10/03/12 2130 10/03/12 2141 10/03/12 2145 10/03/12 2242  BP: 182/101 171/88 190/78 172/84  Pulse: 71 70 75 72  Temp:      TempSrc:      Resp: 19 19 19 16   Height:      Weight:      SpO2: 98% 98% 99% 98%   Blood pressure 172/84, pulse 72, temperature 98.5 F (36.9 C), temperature source Oral, resp. rate 16, height 5\' 8"  (1.727 m), weight 87.091 kg (192 lb), SpO2 98.00%. PSYCH: He is alert and oriented x4; does not appear anxious does not appear depressed; affect is normal HEENT: Normocephalic and Atraumatic, Mucous membranes pink; PERRLA; EOM intact; Fundi:  Benign;  No scleral icterus, Nares: Patent, Oropharynx:  Clear, Fair Dentition, Neck:  FROM, no cervical lymphadenopathy nor thyromegaly; Breasts:: Not examined CHEST WALL: No tenderness CHEST: Normal respiration, clear to auscultation bilaterally HEART: Regular rate and rhythm; no murmurs rubs or gallops BACK: No kyphosis or scoliosis; no CVA tenderness ABDOMEN: Positive Bowel Sounds, soft non-tender; no masses, no organomegaly.   Rectal Exam: Not done EXTREMITIES: No cyanosis, clubbing or edema; no ulcerations. Genitalia: not examined PULSES: 2+ and symmetric SKIN: Normal hydration no rash or ulceration CNS: Cranial nerves 2-12 grossly intact, No sensory, or motor Deficit, DTRs  2/4 and intact, Cerebellar fxn intact, Gait not assessed.       Labs on Admission:  Basic Metabolic Panel:  Recent Labs Lab 10/03/12 1939  NA 138  K 4.2  CL 104  CO2 27  GLUCOSE 183*  BUN 28*  CREATININE 0.98  CALCIUM 8.8   Liver Function Tests: No results found for this basename: AST, ALT, ALKPHOS, BILITOT, PROT, ALBUMIN,  in the last 168 hours No results found for this basename: LIPASE, AMYLASE,  in the last 168 hours No results found for this basename: AMMONIA,  in the last 168 hours CBC:  Recent Labs Lab 10/03/12 1939  WBC 7.9  NEUTROABS 6.8  HGB 15.5  HCT 44.4  MCV 93.1  PLT 127*   Cardiac Enzymes:  Recent Labs Lab 10/03/12 1939  TROPONINI <0.30    BNP (last 3 results) No results found for this basename: PROBNP,  in the last 8760 hours CBG: No results found for this basename: GLUCAP,  in the last 168 hours  Radiological Exams on Admission: Ct Chest W Contrast  10/03/2012   *RADIOLOGY REPORT*  Clinical Data:  With question medial right upper lobe versus the right superior mediastinal mass  CT CHEST WITH CONTRAST  Technique:  Multidetector CT imaging of the chest was performed following the standard protocol during bolus administration of intravenous contrast. Sagittal and coronal MPR images reconstructed from axial data set.  Contrast: 75mL OMNIPAQUE IOHEXOL 300 MG/ML  SOLN  Comparison: Chest radiograph 10/03/2012  Findings: Postsurgical changes of CABG. Scattered atherosclerotic calcifications aorta at, coronary arteries and great vessels. Markedly tortuous innominate artery, accounting for abnormal soft tissue density at the right superior mediastinum. No evidence of mediastinal mass or adenopathy. Visualized portion of upper abdomen significant only for a tiny calcified gallstone in the gallbladder. Lungs clear with no evidence of pulmonary mass or infiltrate. No pleural effusion or pneumothorax. Osseous structures unremarkable.  IMPRESSION: No evidence of right  upper lobe mass or mediastinal mass/adenopathy. Abnormality identified on chest radiography at the medial right upper lobe/superior mediastinum is due to a markedly tortuous innominate artery. Cholelithiasis.   Original Report Authenticated By: Ulyses Southward, M.D.   Dg Chest Portable 1 View  10/03/2012   *RADIOLOGY REPORT*  Clinical Data:  Loss of consciousness, history hypertension  PORTABLE CHEST - 1 VIEW  Comparison: Portable exam 1949 hours compared to 01/27/2009  Findings: Upper normal heart size post median sternotomy. Atherosclerotic calcification aorta. Pulmonary vascularity normal. New medial right apical versus right superior mediastinal soft tissue mass approximately 5 cm diameter. No definite mass effect upon trachea. Remaining lungs clear. No pleural effusion or pneumothorax.  IMPRESSION: New abnormal soft tissue density at medial right apex question medial right upper lobe versus superior mediastinal mass; CT chest with contrast recommended for further evaluation.   Original Report Authenticated By: Ulyses Southward, M.D.     EKG:  Ordered   Assessment/Plan Principal Problem:   Syncope Active Problems:  Lung mass   Dehydration   CAD (coronary artery disease)   Hypertension   Hyperlipidemia   Thrombocytopenia   1.  Syncope-  Syncope workup initiated, Neuro checks, Orthostatic Vital signs, cycle troponins,   2.  Lung Joseph Art Mass-  Seen on Chest X-ray,  CT scan ordered, May need Pulmonary evaluation.   3.  Dehydration-  Gentle Rehydration, monitor BUN/Cr.     4.  CAD- history, cycle troponins, monitor on telemetry for signs of arrhythmias.    5.  HTN-  Continue AZOR,  Added IV hydralazine PRN SBP > 160.     6.  Hyperlipidemia-   On Crestor and Omega 3 Fatty Acids.    7.  Thrombocytopenia- Monitor PLT trend.     8.  DVT prophylaxis with SCDs.             ADDENDUM NOTE:   CT Scan of Chest :  No RUL or Mediastinal mass identified, CXR findings due to Tortuous Aorta.    See Reading.         Code Status:    FULL CODE Family Communication:  Daughter at Bedside Disposition Plan:    Return to Home on discharge  Time spent: 79 Minutes  Ron Parker [facility] Pager [phone]  If 7PM-7AM, please contact night-coverage [email or web address] Password [facility] 10/03/2012, 10:56 PM

## 2012-10-03 NOTE — ED Notes (Signed)
Glick, MD at bedside.  

## 2012-10-03 NOTE — ED Notes (Signed)
Stated the last 2 days he has been working out in the [employer] but has been drinking plenty of fluids (stated that he drank about 1 gallon of water).  Today while at work he got feeling dizzy and per co workers he fell to the floor with some LOC (unsure of the amount of time).  EMS placed the monitor on and found him to be in a 3rd degree block.  Enroute he converted to SR without any medication.  C/o feeling dizzy (history of vertigo) while being transported.

## 2012-10-04 DIAGNOSIS — IMO0001 Reserved for inherently not codable concepts without codable children: Secondary | ICD-10-CM

## 2012-10-04 DIAGNOSIS — I2510 Atherosclerotic heart disease of native coronary artery without angina pectoris: Secondary | ICD-10-CM

## 2012-10-04 DIAGNOSIS — R55 Syncope and collapse: Secondary | ICD-10-CM

## 2012-10-04 DIAGNOSIS — Z951 Presence of aortocoronary bypass graft: Secondary | ICD-10-CM

## 2012-10-04 DIAGNOSIS — R7309 Other abnormal glucose: Secondary | ICD-10-CM

## 2012-10-04 DIAGNOSIS — R7989 Other specified abnormal findings of blood chemistry: Secondary | ICD-10-CM

## 2012-10-04 DIAGNOSIS — I10 Essential (primary) hypertension: Secondary | ICD-10-CM

## 2012-10-04 DIAGNOSIS — E860 Dehydration: Secondary | ICD-10-CM

## 2012-10-04 LAB — BASIC METABOLIC PANEL
BUN: 19 mg/dL (ref 6–23)
Calcium: 8.9 mg/dL (ref 8.4–10.5)
Chloride: 105 mEq/L (ref 96–112)
Creatinine, Ser: 0.87 mg/dL (ref 0.50–1.35)
GFR calc Af Amer: >90 mL/min (ref >90)
GFR calc non Af Amer: 84 mL/min — ABNORMAL LOW (ref >90)

## 2012-10-04 LAB — CBC
HCT: 45.8 % (ref 39.0–52.0)
MCHC: 34.5 g/dL (ref 30.0–36.0)
RDW: 14.1 % (ref 11.5–15.5)

## 2012-10-04 LAB — TROPONIN I: Troponin I: <0.3 ng/mL (ref <0.30)

## 2012-10-04 MED ORDER — IRBESARTAN 150 MG PO TABS
150.0000 mg | ORAL_TABLET | Freq: Every day | ORAL | Status: DC
  Administered 2012-10-04 – 2012-10-05 (×2): 150 mg via ORAL
  Filled 2012-10-04 (×3): qty 1

## 2012-10-04 MED ORDER — NAPROXEN 375 MG PO TABS
375.0000 mg | ORAL_TABLET | Freq: Two times a day (BID) | ORAL | Status: DC
  Administered 2012-10-04 – 2012-10-05 (×3): 375 mg via ORAL
  Filled 2012-10-04 (×4): qty 1

## 2012-10-04 MED ORDER — AMLODIPINE BESYLATE 10 MG PO TABS
10.0000 mg | ORAL_TABLET | Freq: Every day | ORAL | Status: DC
  Administered 2012-10-04 – 2012-10-05 (×2): 10 mg via ORAL
  Filled 2012-10-04 (×3): qty 1

## 2012-10-04 NOTE — Progress Notes (Signed)
[facility] PROGRESS NOTE  Ricardo Hunt ZOX:[id number] DOB: [birth year] DOA: 10/03/2012 PCP: Josue Hector, MD  Assessment/Plan: 1. Syncope -EKG unchanged -cardiac enzymes negative x3 -monitor on Tele -check 2D ECHO -add on TSH/T4  2. Rib pain/Contusion after fall -tender over R lower rib cage, no obvious fracture on Cxr or Ct chest -NSAIDs for 2 days  3. CAD, S/p CABG -stable -continue ASA/statin  4. HTN -BP on higher side -increase norvasc, continue avapro -hydralazine PRN  5. Abnromal CXR -but CT chest shows tortuous innominate artery and no mass   Code Status: FULL Family Communication:d/w pt and daughter at bedside Disposition Plan:home tomorrow if stable   HPI/Subjective: C/o pain in back on R side  Objective: Filed Vitals:   10/04/12 0530 10/04/12 0535 10/04/12 0540 10/04/12 0633  BP: 174/83 181/81 183/92 135/70  Pulse: 64 61 64 60  Temp: 97.1 F (36.2 C)     TempSrc: Oral     Resp: 18     Height:      Weight:      SpO2: 95% 97% 98% 94%    Intake/Output Summary (Last 24 hours) at 10/04/12 0836 Last data filed at 10/04/12 0600  Gross per 24 hour  Intake   1000 ml  Output    350 ml  Net    650 ml   Filed Weights   10/03/12 1924 10/03/12 2257  Weight: 87.091 kg (192 lb) 88.043 kg (194 lb 1.6 oz)    Exam:   General:  AAOx3  Cardiovascular: S1S2/RRR  Respiratory: CTAB  Abdomen: soft, Nt, BS present  Musculoskeletal: no edema c/c  Back: tenderness over R lateral lower rib cage   Data Reviewed: Basic Metabolic Panel:  Recent Labs Lab 10/03/12 1939 10/04/12 0545  NA 138 140  K 4.2 4.3  CL 104 105  CO2 27 27  GLUCOSE 183* 126*  BUN 28* 19  CREATININE 0.98 0.87  CALCIUM 8.8 8.9   Liver Function Tests: No results found for this basename: AST, ALT, ALKPHOS, BILITOT, PROT, ALBUMIN,  in the last 168 hours No results found for this basename: LIPASE, AMYLASE,  in the last 168 hours No results found for this  basename: AMMONIA,  in the last 168 hours CBC:  Recent Labs Lab 10/03/12 1939 10/04/12 0545  WBC 7.9 7.9  NEUTROABS 6.8  --   HGB 15.5 15.8  HCT 44.4 45.8  MCV 93.1 94.4  PLT 127* 148*   Cardiac Enzymes:  Recent Labs Lab 10/03/12 1939 10/03/12 2330 10/04/12 0545  TROPONINI <0.30 <0.30 <0.30   BNP (last 3 results) No results found for this basename: PROBNP,  in the last 8760 hours CBG: No results found for this basename: GLUCAP,  in the last 168 hours  No results found for this or any previous visit (from the past 240 hour(s)).   Studies: Ct Chest W Contrast  10/03/2012   *RADIOLOGY REPORT*  Clinical Data:  With question medial right upper lobe versus the right superior mediastinal mass  CT CHEST WITH CONTRAST  Technique:  Multidetector CT imaging of the chest was performed following the standard protocol during bolus administration of intravenous contrast. Sagittal and coronal MPR images reconstructed from axial data set.  Contrast: 75mL OMNIPAQUE IOHEXOL 300 MG/ML  SOLN  Comparison: Chest radiograph 10/03/2012  Findings: Postsurgical changes of CABG. Scattered atherosclerotic calcifications aorta at, coronary arteries and great vessels. Markedly tortuous innominate artery, accounting for abnormal soft tissue density at the right superior mediastinum. No evidence of mediastinal  mass or adenopathy. Visualized portion of upper abdomen significant only for a tiny calcified gallstone in the gallbladder. Lungs clear with no evidence of pulmonary mass or infiltrate. No pleural effusion or pneumothorax. Osseous structures unremarkable.  IMPRESSION: No evidence of right upper lobe mass or mediastinal mass/adenopathy. Abnormality identified on chest radiography at the medial right upper lobe/superior mediastinum is due to a markedly tortuous innominate artery. Cholelithiasis.   Original Report Authenticated By: Ulyses Southward, M.D.   Dg Chest Portable 1 View  10/03/2012   *RADIOLOGY REPORT*   Clinical Data:  Loss of consciousness, history hypertension  PORTABLE CHEST - 1 VIEW  Comparison: Portable exam 1949 hours compared to 01/27/2009  Findings: Upper normal heart size post median sternotomy. Atherosclerotic calcification aorta. Pulmonary vascularity normal. New medial right apical versus right superior mediastinal soft tissue mass approximately 5 cm diameter. No definite mass effect upon trachea. Remaining lungs clear. No pleural effusion or pneumothorax.  IMPRESSION: New abnormal soft tissue density at medial right apex question medial right upper lobe versus superior mediastinal mass; CT chest with contrast recommended for further evaluation.   Original Report Authenticated By: Ulyses Southward, M.D.    Scheduled Meds: . amLODipine  10 mg Oral QHS   And  . irbesartan  150 mg Oral QHS  . aspirin EC  81 mg Oral Daily  . atorvastatin  40 mg Oral q1800  . naproxen  375 mg Oral BID WC  . omega-3 acid ethyl esters  2 g Oral Daily  . sodium chloride  3 mL Intravenous Q12H   Continuous Infusions: . sodium chloride 75 mL/hr (10/03/12 2309)    Principal Problem:   Syncope Active Problems:   Lung mass   CAD (coronary artery disease)   Hypertension   Hyperlipidemia   Thrombocytopenia   Dehydration    Time spent:    [facility]  [facility] Pager [phone]. If 7PM-7AM, please contact night-coverage at [email or web address], password [facility] 10/04/2012, 8:36 AM  LOS: 1 day

## 2012-10-04 NOTE — Progress Notes (Signed)
Utilization review completed.  

## 2012-10-05 ENCOUNTER — Observation Stay (HOSPITAL_COMMUNITY): Payer: Medicare Other

## 2012-10-05 DIAGNOSIS — S2239XA Fracture of one rib, unspecified side, initial encounter for closed fracture: Secondary | ICD-10-CM

## 2012-10-05 DIAGNOSIS — I379 Nonrheumatic pulmonary valve disorder, unspecified: Secondary | ICD-10-CM

## 2012-10-05 LAB — HEMOGLOBIN A1C
Hgb A1c MFr Bld: 5.4 % (ref <5.7)
Mean Plasma Glucose: 108 mg/dL (ref <117)

## 2012-10-05 MED ORDER — NAPROXEN 500 MG PO TABS
500.0000 mg | ORAL_TABLET | Freq: Three times a day (TID) | ORAL | Status: DC
  Administered 2012-10-05 – 2012-10-06 (×4): 500 mg via ORAL
  Filled 2012-10-05 (×7): qty 1

## 2012-10-05 MED ORDER — HYDRALAZINE HCL 25 MG PO TABS
25.0000 mg | ORAL_TABLET | Freq: Three times a day (TID) | ORAL | Status: DC
  Administered 2012-10-05 – 2012-10-06 (×4): 25 mg via ORAL
  Filled 2012-10-05 (×7): qty 1

## 2012-10-05 MED ORDER — PANTOPRAZOLE SODIUM 40 MG PO TBEC
40.0000 mg | DELAYED_RELEASE_TABLET | Freq: Every day | ORAL | Status: DC
  Administered 2012-10-05: 40 mg via ORAL
  Filled 2012-10-05: qty 1

## 2012-10-05 NOTE — Progress Notes (Signed)
  Echocardiogram 2D Echocardiogram has been performed.  [place] 10/05/2012, 8:58 AM

## 2012-10-05 NOTE — Progress Notes (Signed)
[facility] PROGRESS NOTE  Ricardo Hunt ZOX:[id number] DOB: 08-07-1940 DOA: 10/03/2012 PCP: Josue Hector, MD  Assessment/Plan: 1. Syncope -EKG unchanged -cardiac enzymes negative x3 -no events on Tele -2D ECHO still pending -FU TSH/T4  2. Rib pain/Contusion after fall -tender over R lower rib cage, no obvious fracture on Cxr or Ct chest -check CXR rib series -NSAIDs increase dose  3. CAD, S/p CABG -stable -continue ASA/statin  4. HTN -BP on higher side -increase norvasc, continue avapro -hydralazine PRN  5. Abnromal CXR -but CT chest shows tortuous innominate artery and no mass  6. Hyperglycemia -check Hbaic   Code Status: FULL Family Communication: none at bedside today Disposition Plan:home tomorrow if stable   HPI/Subjective: C/o pain in back on R side  Objective: Filed Vitals:   10/05/12 0018 10/05/12 0357 10/05/12 0358 10/05/12 0402  BP: 126/66 162/94 169/93 166/90  Pulse: 64 61 68 71  Temp: 97.7 F (36.5 C) 98.5 F (36.9 C)    TempSrc: Oral Oral    Resp: 19 19    Height:      Weight:      SpO2: 96% 96%      Intake/Output Summary (Last 24 hours) at 10/05/12 0919 Last data filed at 10/05/12 0100  Gross per 24 hour  Intake 1113.75 ml  Output    550 ml  Net 563.75 ml   Filed Weights   10/03/12 1924 10/03/12 2257  Weight: 87.091 kg (192 lb) 88.043 kg (194 lb 1.6 oz)    Exam:   General:  AAOx3  Cardiovascular: S1S2/RRR  Respiratory: CTAB  Abdomen: soft, Nt, BS present  Musculoskeletal: no edema c/c  Back: tenderness over R lateral lower rib cage   Data Reviewed: Basic Metabolic Panel:  Recent Labs Lab 10/03/12 1939 10/04/12 0545  NA 138 140  K 4.2 4.3  CL 104 105  CO2 27 27  GLUCOSE 183* 126*  BUN 28* 19  CREATININE 0.98 0.87  CALCIUM 8.8 8.9   Liver Function Tests: No results found for this basename: AST, ALT, ALKPHOS, BILITOT, PROT, ALBUMIN,  in the last 168 hours No results found for this basename:  LIPASE, AMYLASE,  in the last 168 hours No results found for this basename: AMMONIA,  in the last 168 hours CBC:  Recent Labs Lab 10/03/12 1939 10/04/12 0545  WBC 7.9 7.9  NEUTROABS 6.8  --   HGB 15.5 15.8  HCT 44.4 45.8  MCV 93.1 94.4  PLT 127* 148*   Cardiac Enzymes:  Recent Labs Lab 10/03/12 1939 10/03/12 2330 10/04/12 0545 10/04/12 1045  TROPONINI <0.30 <0.30 <0.30 <0.30   BNP (last 3 results) No results found for this basename: PROBNP,  in the last 8760 hours CBG: No results found for this basename: GLUCAP,  in the last 168 hours  No results found for this or any previous visit (from the past 240 hour(s)).   Studies: Ct Chest W Contrast  10/03/2012   *RADIOLOGY REPORT*  Clinical Data:  With question medial right upper lobe versus the right superior mediastinal mass  CT CHEST WITH CONTRAST  Technique:  Multidetector CT imaging of the chest was performed following the standard protocol during bolus administration of intravenous contrast. Sagittal and coronal MPR images reconstructed from axial data set.  Contrast: 75mL OMNIPAQUE IOHEXOL 300 MG/ML  SOLN  Comparison: Chest radiograph 10/03/2012  Findings: Postsurgical changes of CABG. Scattered atherosclerotic calcifications aorta at, coronary arteries and great vessels. Markedly tortuous innominate artery, accounting for abnormal soft tissue density  at the right superior mediastinum. No evidence of mediastinal mass or adenopathy. Visualized portion of upper abdomen significant only for a tiny calcified gallstone in the gallbladder. Lungs clear with no evidence of pulmonary mass or infiltrate. No pleural effusion or pneumothorax. Osseous structures unremarkable.  IMPRESSION: No evidence of right upper lobe mass or mediastinal mass/adenopathy. Abnormality identified on chest radiography at the medial right upper lobe/superior mediastinum is due to a markedly tortuous innominate artery. Cholelithiasis.   Original Report Authenticated  By: Ulyses Southward, M.D.   Dg Chest Portable 1 View  10/03/2012   *RADIOLOGY REPORT*  Clinical Data:  Loss of consciousness, history hypertension  PORTABLE CHEST - 1 VIEW  Comparison: Portable exam 1949 hours compared to 01/27/2009  Findings: Upper normal heart size post median sternotomy. Atherosclerotic calcification aorta. Pulmonary vascularity normal. New medial right apical versus right superior mediastinal soft tissue mass approximately 5 cm diameter. No definite mass effect upon trachea. Remaining lungs clear. No pleural effusion or pneumothorax.  IMPRESSION: New abnormal soft tissue density at medial right apex question medial right upper lobe versus superior mediastinal mass; CT chest with contrast recommended for further evaluation.   Original Report Authenticated By: Ulyses Southward, M.D.    Scheduled Meds: . amLODipine  10 mg Oral QHS   And  . irbesartan  150 mg Oral QHS  . aspirin EC  81 mg Oral Daily  . atorvastatin  40 mg Oral q1800  . hydrALAZINE  25 mg Oral Q8H  . naproxen  500 mg Oral TID WC  . omega-3 acid ethyl esters  2 g Oral Daily  . pantoprazole  40 mg Oral Q1200  . sodium chloride  3 mL Intravenous Q12H   Continuous Infusions: . sodium chloride 75 mL/hr (10/03/12 2309)    Principal Problem:   Syncope Active Problems:   Lung mass   CAD (coronary artery disease)   Hypertension   Hyperlipidemia   Thrombocytopenia   Dehydration    Time spent:    [facility]  [facility] Pager [phone]. If 7PM-7AM, please contact night-coverage at [email or web address], password [facility] 10/05/2012, 9:19 AM  LOS: 2 days

## 2012-10-06 DIAGNOSIS — Z951 Presence of aortocoronary bypass graft: Secondary | ICD-10-CM

## 2012-10-06 MED ORDER — HYDRALAZINE HCL 50 MG PO TABS: 50.0000 mg | ORAL_TABLET | Freq: Three times a day (TID) | ORAL | Status: DC

## 2012-10-06 MED ORDER — AMLODIPINE BESYLATE 10 MG PO TABS: 10.0000 mg | ORAL_TABLET | Freq: Every day | ORAL | Status: DC

## 2012-10-06 MED ORDER — NAPROXEN 500 MG PO TABS: 500.0000 mg | ORAL_TABLET | Freq: Three times a day (TID) | ORAL | Status: DC

## 2012-10-06 MED ORDER — PANTOPRAZOLE SODIUM 40 MG PO TBEC: 40.0000 mg | DELAYED_RELEASE_TABLET | Freq: Every day | ORAL | Status: DC

## 2012-10-06 MED ORDER — HYDRALAZINE HCL 50 MG PO TABS
50.0000 mg | ORAL_TABLET | Freq: Three times a day (TID) | ORAL | Status: DC
  Administered 2012-10-06: 50 mg via ORAL
  Filled 2012-10-06 (×4): qty 1

## 2012-10-06 MED ORDER — OXYCODONE HCL 5 MG PO TABS: 5.0000 mg | ORAL_TABLET | ORAL | Status: DC | PRN

## 2012-10-06 NOTE — Discharge Summary (Signed)
Physician Discharge Summary  Ricardo Hunt ZOX:[id number] DOB: [birth year] DOA: 10/03/2012  PCP: Ricardo Hector, MD  Admit date: 10/03/2012 Discharge date: 10/06/2012  Time spent: 45 minutes  Recommendations for Outpatient Follow-up:  Discharge Orders   Future Appointments Provider Department Dept Phone   10/09/2012 1:20 PM Laqueta Linden, MD [facility] (near [place]) [phone]   Future Orders Complete By Expires            Discharge Diagnoses:    Syncope   CAD s/p CABG   Hypertension   Hyperlipidemia   Thrombocytopenia   Dehydration   Rib fracture    Discharge Condition: stable  Diet recommendation: Low sodium, heart healthy  Filed Weights   10/03/12 1924 10/03/12 2257  Weight: 87.091 kg (192 lb) 88.043 kg (194 lb 1.6 oz)    History of present illness:  Ricardo Hunt is a 72 y.o. male with a history of CAD and remote history of a CABG who presented to the ED after passing out at work. He reported that he felt dizzy and all of a sudden blacked out. His co-workers stated that he had been passed out for about 3 seconds. He denied having any previous similar episodes.   Hospital Course:  1. Syncope -EKG unchanged  -cardiac enzymes negative x3  -no events on Tele  -2D ECHO completed, EF 45-50% with mid inferior hypokinesis, no prior ECHO in system for comparison from the time of CABG. -no chest pain, Dyspnea at rest of with activity -Has not followed up with [facility] in over 10years. -I made him a FU with [place] at [facility] for 7/10 with Dr.Koneswaran -continue ASA, statin, BB not added since heart rate has been in 55-65 range throughout. -TSH/T4 normal  2. Rib pain/Contusion after fall  -was tender over R lower rib cage, no obvious fracture on CXR or CT chest  -I checked an CXR rib series which finally showed Right eighth rib fracture -put on NSAIDs for 2-3 days, incentive spirometry  3. CAD, S/p CABG  -stable  -continue ASA/statin   -rest as noted above  4. HTN  -BP has been running in 160s range  -increased norvasc, stopped avapro since he will be on NSAIDs for a few days for his rib fracture -hydralazine added  5. Abnromal CXR  -but CT chest shows tortuous innominate artery and no mass   6. Mild Hyperglycemia  - Hbaic 5.4 only      Discharge Exam: Filed Vitals:   10/05/12 1858 10/05/12 2018 10/06/12 0410 10/06/12 0811  BP: 166/81 167/79 160/88 139/64  Pulse:  67 73 76  Temp:  99.2 F (37.3 C) 98.4 F (36.9 C) 97.9 F (36.6 C)  TempSrc:  Oral Oral Oral  Resp:  19 19 18   Height:      Weight:      SpO2:  94% 94% 96%    General: AAOx3 Cardiovascular: S1S2/RRR Respiratory:CTAB  Discharge Instructions  Discharge Orders   Future Appointments Provider Department Dept Phone   10/09/2012 1:20 PM Laqueta Linden, MD [facility] (near [place]) [phone]   Future Orders Complete By Expires     Diet - low sodium heart healthy  As directed     Discharge instructions  As directed     Comments:      DO not drive or operate machinery while taking narcotics, DO not go back to work till cleared by PCP upon follow up   Use Incentive spirometer every 1-2 hours when awake  for 5 days    Increase activity slowly  As directed         Medication List    STOP taking these medications       AZOR 5-20 MG per tablet  Generic drug:  amLODipine-olmesartan     ibuprofen 200 MG tablet  Commonly known as:  ADVIL,MOTRIN      TAKE these medications       amLODipine 10 MG tablet  Commonly known as:  NORVASC  Take 1 tablet (10 mg total) by mouth at bedtime.     aspirin EC 81 MG tablet  Take 81 mg by mouth daily.     Fish Oil 1000 MG Caps  Take 2,000 mg by mouth daily.     hydrALAZINE 50 MG tablet  Commonly known as:  APRESOLINE  Take 1 tablet (50 mg total) by mouth every 8 (eight) hours.     naproxen 500 MG tablet  Commonly known as:  NAPROSYN  Take 1 tablet (500 mg total) by  mouth 3 (three) times daily with meals. For 2 days     oxyCODONE 5 MG immediate release tablet  Commonly known as:  Oxy IR/ROXICODONE  Take 1 tablet (5 mg total) by mouth every 4 (four) hours as needed for pain.     pantoprazole 40 MG tablet  Commonly known as:  PROTONIX  Take 1 tablet (40 mg total) by mouth daily at 12 noon.     rosuvastatin 20 MG tablet  Commonly known as:  CRESTOR  Take 20 mg by mouth at bedtime.       No Known Allergies     Follow-up Information   Follow up with Laqueta Linden, MD On 10/09/2012. [facility] at 1:00 PM)    Contact information:   [address] [place] [address] [phone]       Follow up with Ricardo Hector, MD In 1 week.   Contact information:   [address] [facility] [address] [phone]        The results of significant diagnostics from this hospitalization (including imaging, microbiology, ancillary and laboratory) are listed below for reference.    Significant Diagnostic Studies: Dg Ribs Unilateral W/chest Right  10/05/2012   *RADIOLOGY REPORT*  Clinical Data: Status post fall.  RIGHT RIBS AND CHEST - 3+ VIEW  Comparison: 10/03/2012  Findings: Previous median sternotomy and CABG procedure.  Heart size is mildly enlarged.  Coarsened interstitial markings are noted bilaterally.  There is atelectasis noted in the left base.  There is an acute fracture involving the posterior aspect of the eighth rib.  IMPRESSION:  1.  Right eighth rib fracture. 2.  Left base atelectasis.   Original Report Authenticated By: Signa Kell, M.D.   Ct Chest W Contrast  10/03/2012   *RADIOLOGY REPORT*  Clinical Data:  With question medial right upper lobe versus the right superior mediastinal mass  CT CHEST WITH CONTRAST  Technique:  Multidetector CT imaging of the chest was performed following the standard protocol during bolus administration of intravenous contrast. Sagittal and coronal MPR images reconstructed from axial  data set.  Contrast: 75mL OMNIPAQUE IOHEXOL 300 MG/ML  SOLN  Comparison: Chest radiograph 10/03/2012  Findings: Postsurgical changes of CABG. Scattered atherosclerotic calcifications aorta at, coronary arteries and great vessels. Markedly tortuous innominate artery, accounting for abnormal soft tissue density at the right superior mediastinum. No evidence of mediastinal mass or adenopathy. Visualized portion of upper abdomen significant only for a tiny calcified  gallstone in the gallbladder. Lungs clear with no evidence of pulmonary mass or infiltrate. No pleural effusion or pneumothorax. Osseous structures unremarkable.  IMPRESSION: No evidence of right upper lobe mass or mediastinal mass/adenopathy. Abnormality identified on chest radiography at the medial right upper lobe/superior mediastinum is due to a markedly tortuous innominate artery. Cholelithiasis.   Original Report Authenticated By: Ulyses Southward, M.D.   Dg Chest Portable 1 View  10/03/2012   *RADIOLOGY REPORT*  Clinical Data:  Loss of consciousness, history hypertension  PORTABLE CHEST - 1 VIEW  Comparison: Portable exam 1949 hours compared to 01/27/2009  Findings: Upper normal heart size post median sternotomy. Atherosclerotic calcification aorta. Pulmonary vascularity normal. New medial right apical versus right superior mediastinal soft tissue mass approximately 5 cm diameter. No definite mass effect upon trachea. Remaining lungs clear. No pleural effusion or pneumothorax.  IMPRESSION: New abnormal soft tissue density at medial right apex question medial right upper lobe versus superior mediastinal mass; CT chest with contrast recommended for further evaluation.   Original Report Authenticated By: Ulyses Southward, M.D.    Microbiology: No results found for this or any previous visit (from the past 240 hour(s)).   Labs: Basic Metabolic Panel:  Recent Labs Lab 10/03/12 1939 10/04/12 0545  NA 138 140  K 4.2 4.3  CL 104 105  CO2 27 27  GLUCOSE  183* 126*  BUN 28* 19  CREATININE 0.98 0.87  CALCIUM 8.8 8.9   Liver Function Tests: No results found for this basename: AST, ALT, ALKPHOS, BILITOT, PROT, ALBUMIN,  in the last 168 hours No results found for this basename: LIPASE, AMYLASE,  in the last 168 hours No results found for this basename: AMMONIA,  in the last 168 hours CBC:  Recent Labs Lab 10/03/12 1939 10/04/12 0545  WBC 7.9 7.9  NEUTROABS 6.8  --   HGB 15.5 15.8  HCT 44.4 45.8  MCV 93.1 94.4  PLT 127* 148*   Cardiac Enzymes:  Recent Labs Lab 10/03/12 1939 10/03/12 2330 10/04/12 0545 10/04/12 1045  TROPONINI <0.30 <0.30 <0.30 <0.30   BNP: BNP (last 3 results) No results found for this basename: PROBNP,  in the last 8760 hours CBG: No results found for this basename: GLUCAP,  in the last 168 hours     Signed:  Alhassan Everingham  [facility] 10/06/2012, 3:43 PM

## 2012-10-09 ENCOUNTER — Encounter: Payer: Self-pay | Admitting: Cardiovascular Disease

## 2012-10-09 ENCOUNTER — Encounter: Payer: Self-pay | Admitting: *Deleted

## 2012-10-09 ENCOUNTER — Other Ambulatory Visit: Payer: Self-pay | Admitting: *Deleted

## 2012-10-09 ENCOUNTER — Telehealth: Payer: Self-pay | Admitting: Cardiovascular Disease

## 2012-10-09 ENCOUNTER — Ambulatory Visit (INDEPENDENT_AMBULATORY_CARE_PROVIDER_SITE_OTHER): Payer: Medicare Other | Admitting: Cardiovascular Disease

## 2012-10-09 VITALS — BP 124/70 | HR 79 | Ht 68.0 in | Wt 186.0 lb

## 2012-10-09 DIAGNOSIS — R55 Syncope and collapse: Secondary | ICD-10-CM

## 2012-10-09 DIAGNOSIS — Z951 Presence of aortocoronary bypass graft: Secondary | ICD-10-CM

## 2012-10-09 DIAGNOSIS — I10 Essential (primary) hypertension: Secondary | ICD-10-CM

## 2012-10-09 DIAGNOSIS — E7800 Pure hypercholesterolemia, unspecified: Secondary | ICD-10-CM

## 2012-10-09 NOTE — Telephone Encounter (Signed)
Lexiscan cardiolite set for 10-23-12 @ [facility]

## 2012-10-09 NOTE — Patient Instructions (Signed)
   48 hour holter monitor  Lexiscan cardiolite (stress test) Office will contact with results via phone or letter.   Continue all current medications. Work note given Follow up in  4-6 weeks

## 2012-10-09 NOTE — Progress Notes (Signed)
Patient ID: Ricardo Hunt, male   DOB: Nov 17, 1940, 72 y.o.   MRN: [id number]    CARDIOLOGY CONSULT NOTE  Patient ID: Ricardo Hunt MRN: [id number] DOB/AGE: Oct 30, 1940 72 y.o.  Admit date: (Not on file) Primary Physician: Joette Catching, MD Reason for Consultation: syncope, CABG, HTN  HPI:  Ricardo Hunt is a 72 y.o. Male with a h/o CAD s/p 3-vessel CABG in 1996 at [facility], HTN, and hypercholesterolemia who was recently discharged after sustaining a 3-second syncopal episode. Troponins were normal and an echocardiogram was performed which revealed mildly reduced LV systolic function, EF 45-50%, with mild LV enlargement and mid and basal inferior wall hypokinesis. He also has thrombocytopenia. He apparently fractured a rib as well. He was found to be dehydrated. He was started on Amlodipine along with Hydralazine, and a beta blocker wasn't added as his HR was in the 50-60 bpm range. No arrhythmias were noted on telemetry while hospitalized.  He was told his HR was 36 bpm. 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  He denies chest pain and shortness of breath, and also denies palpitations and leg swelling. Prior to losing consciousness, he denies antecedent chest pain and shortness of breath. He did feel dizzy. He had no urinary or bowel incontinence.  He now only c/o rib pain on the right side.  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 Quit smoking in 1996.   Family History  Problem Relation Age of Onset  . CAD Father   . CAD Mother   . Hypertension Father   . Diabetes Mother   . Breast cancer      Niece    History   Social History  . Marital Status: Widowed    Spouse Name: N/A    Number of Children: N/A  . Years of Education: N/A   Occupational History  . Not on file.   Social  History Main Topics  . Smoking status: Former Smoker -- 2.00 packs/day for 40 years    Types: Cigarettes  . Smokeless tobacco: Never Used  . Alcohol Use: Yes     Comment: ocassionally  . Drug Use: No  . Sexually Active: Yes   Other Topics Concern  . Not on file   Social History Narrative  . No narrative on file      (Not in a hospital admission)  Physical exam Blood pressure 124/70, pulse 79, height 5\' 8"  (1.727 m), weight 186 lb (84.369 kg). General: NAD Neck: No JVD, no thyromegaly or thyroid nodule.  Lungs: Clear to auscultation bilaterally with normal respiratory effort. CV: Nondisplaced PMI.  Heart regular S1/S2, no S3/S4, no murmur.  No peripheral edema.  No carotid bruit.  Normal pedal pulses.  Abdomen: Soft, nontender, no hepatosplenomegaly, no distention.  Skin: Intact without lesions or rashes.  Neurologic: Alert and oriented x 3.  Psych: Normal affect. Extremities: No clubbing or cyanosis.  HEENT: Normal.   Labs:   Lab Results  Component Value Date   WBC 7.9 10/04/2012   HGB 15.8 10/04/2012   HCT 45.8 10/04/2012   MCV 94.4 10/04/2012   PLT 148* 10/04/2012    Recent Labs Lab 10/04/12 0545  NA 140  K 4.3  CL 105  CO2 27  BUN 19  CREATININE 0.87  CALCIUM 8.9  GLUCOSE 126*   Lab Results  Component Value Date   CKTOTAL 59 01/12/2007   CKMB 1.8 01/12/2007   TROPONINI <0.30 10/04/2012    Lab Results  Component Value Date   CHOL  Value: 170        ATP III CLASSIFICATION:  <200     mg/dL   Desirable  409-811  mg/dL   Borderline High  >=914    mg/dL   High 78/29/5621   Lab Results  Component Value Date   HDL 30* 01/12/2007   Lab Results  Component Value Date   LDLCALC  Value: 90        Total Cholesterol/HDL:CHD Risk Coronary Heart Disease Risk Table                     Men   Women  1/2 Average Risk   3.4   3.3 01/12/2007   Lab Results  Component Value Date   TRIG 250* 01/12/2007   Lab Results  Component Value Date   CHOLHDL 5.7 01/12/2007   No  results found for this basename: LDLDIRECT       EKG: Sinus rhythm, with RBBB and LVH  Radiology: tortuous innominate artery   ASSESSMENT AND PLAN:  1. Syncope: may have been due to a bradyarrhythmia, and given his reduced EF with inferior wall motion abnormalities, there may be an ischemic etiology as well. I will proceed with a 48 hour Holter monitor and a Lexiscan Cardiolite stress test.  2. CAD s/p 3-v CABG: mildly reduced LV systolic function. Will hold off on beta blocker inititation. Continue ASA and Crestor.  3. HTN: controlled on Amlodipine and Hydralazine.  4. Hypercholesterolemia: continue Crestor.  Signed: Prentice Docker, M.D., F.A.C.C. 10/09/2012, 1:29 PM

## 2012-10-10 ENCOUNTER — Telehealth: Payer: Self-pay | Admitting: Cardiovascular Disease

## 2012-10-10 NOTE — Telephone Encounter (Signed)
[employer] GNFA#[id number] expires 11-24-12

## 2012-10-10 NOTE — Telephone Encounter (Signed)
TESTING

## 2012-10-12 ENCOUNTER — Encounter (HOSPITAL_COMMUNITY): Payer: Self-pay | Admitting: Cardiology

## 2012-10-12 ENCOUNTER — Emergency Department (HOSPITAL_COMMUNITY)
Admission: EM | Admit: 2012-10-12 | Discharge: 2012-10-12 | Disposition: A | Discharge status: Home / Self Care | Payer: Medicare Other | Attending: Emergency Medicine | Admitting: Emergency Medicine

## 2012-10-12 DIAGNOSIS — E785 Hyperlipidemia, unspecified: Secondary | ICD-10-CM | POA: Insufficient documentation

## 2012-10-12 DIAGNOSIS — Z951 Presence of aortocoronary bypass graft: Secondary | ICD-10-CM | POA: Insufficient documentation

## 2012-10-12 DIAGNOSIS — Z87828 Personal history of other (healed) physical injury and trauma: Secondary | ICD-10-CM | POA: Insufficient documentation

## 2012-10-12 DIAGNOSIS — Z7982 Long term (current) use of aspirin: Secondary | ICD-10-CM | POA: Insufficient documentation

## 2012-10-12 DIAGNOSIS — Z79899 Other long term (current) drug therapy: Secondary | ICD-10-CM | POA: Insufficient documentation

## 2012-10-12 DIAGNOSIS — H538 Other visual disturbances: Secondary | ICD-10-CM | POA: Insufficient documentation

## 2012-10-12 DIAGNOSIS — R5381 Other malaise: Secondary | ICD-10-CM | POA: Insufficient documentation

## 2012-10-12 DIAGNOSIS — Z9181 History of falling: Secondary | ICD-10-CM | POA: Insufficient documentation

## 2012-10-12 DIAGNOSIS — R110 Nausea: Secondary | ICD-10-CM | POA: Insufficient documentation

## 2012-10-12 DIAGNOSIS — I10 Essential (primary) hypertension: Secondary | ICD-10-CM | POA: Insufficient documentation

## 2012-10-12 DIAGNOSIS — Z862 Personal history of diseases of the blood and blood-forming organs and certain disorders involving the immune mechanism: Secondary | ICD-10-CM | POA: Insufficient documentation

## 2012-10-12 DIAGNOSIS — I2510 Atherosclerotic heart disease of native coronary artery without angina pectoris: Secondary | ICD-10-CM | POA: Insufficient documentation

## 2012-10-12 DIAGNOSIS — R42 Dizziness and giddiness: Secondary | ICD-10-CM | POA: Insufficient documentation

## 2012-10-12 DIAGNOSIS — Z87891 Personal history of nicotine dependence: Secondary | ICD-10-CM | POA: Insufficient documentation

## 2012-10-12 DIAGNOSIS — Z8781 Personal history of (healed) traumatic fracture: Secondary | ICD-10-CM | POA: Insufficient documentation

## 2012-10-12 DIAGNOSIS — R5383 Other fatigue: Secondary | ICD-10-CM | POA: Insufficient documentation

## 2012-10-12 LAB — CBC WITH DIFFERENTIAL/PLATELET
Eosinophils Absolute: 0.1 10*3/uL (ref 0.0–0.7)
Eosinophils Relative: 1 % (ref 0–5)
Hemoglobin: 16.3 g/dL (ref 13.0–17.0)
Lymphocytes Relative: 11 % — ABNORMAL LOW (ref 12–46)
Lymphs Abs: 0.9 10*3/uL (ref 0.7–4.0)
MCH: 33.2 pg (ref 26.0–34.0)
MCV: 94.5 fL (ref 78.0–100.0)
Monocytes Relative: 8 % (ref 3–12)
Neutrophils Relative %: 80 % — ABNORMAL HIGH (ref 43–77)
Platelets: 195 10*3/uL (ref 150–400)
RBC: 4.91 MIL/uL (ref 4.22–5.81)
WBC: 7.9 10*3/uL (ref 4.0–10.5)

## 2012-10-12 LAB — BASIC METABOLIC PANEL
CO2: 29 mEq/L (ref 19–32)
Calcium: 9.6 mg/dL (ref 8.4–10.5)
Glucose, Bld: 139 mg/dL — ABNORMAL HIGH (ref 70–99)
Potassium: 4.4 mEq/L (ref 3.5–5.1)
Sodium: 140 mEq/L (ref 135–145)

## 2012-10-12 LAB — TROPONIN I: Troponin I: <0.3 ng/mL (ref <0.30)

## 2012-10-12 MED ORDER — SODIUM CHLORIDE 0.9 % IV BOLUS (SEPSIS)
1000.0000 mL | Freq: Once | INTRAVENOUS | Status: AC
  Administered 2012-10-12: 1000 mL via INTRAVENOUS

## 2012-10-12 MED ORDER — OXYCODONE-ACETAMINOPHEN 5-325 MG PO TABS: 2.0000 | ORAL_TABLET | ORAL | Status: DC | PRN

## 2012-10-12 MED ORDER — HYDROMORPHONE HCL PF 1 MG/ML IJ SOLN
1.0000 mg | Freq: Once | INTRAMUSCULAR | Status: AC
  Administered 2012-10-12: 1 mg via INTRAVENOUS
  Filled 2012-10-12: qty 1

## 2012-10-12 MED ORDER — MORPHINE SULFATE 4 MG/ML IJ SOLN
4.0000 mg | Freq: Once | INTRAMUSCULAR | Status: AC
  Administered 2012-10-12: 4 mg via INTRAVENOUS
  Filled 2012-10-12: qty 1

## 2012-10-12 NOTE — ED Notes (Addendum)
Pt to department via EMS- pt reports that he was here recently and dx with a rib fracture. States yesterday he felt dizzy and nauseated. Pt noted to have a right bbb in route without cardiac history. EMS reports that while in route pt had an episode of nausea and showed a period of 4 seconds of asystole. Pt given 10mg  morphine and 4mg  zofran. Pt reports pain on the right side still. Bp-164/76 Hr-90

## 2012-10-12 NOTE — ED Provider Notes (Signed)
History    CSN: [id number] Arrival date & time 10/12/12  1104  First MD Initiated Contact with Patient 10/12/12 1125     Chief Complaint  Patient presents with  . Dizziness  . Nausea    HPI  Pt is a 72 yo M with HTN, HL, thrombocytopenia, CAD s/p CABG with recent EF 45- 50% and right bundle branch block on EKG, with recent syncope and rib fracture on pain medication who presents with dizziness that began this morning. Pt had episode of brief dizziness initially yesterday that resolved after short time. This morning he had similar dizziness that started about 5 AM and lasted until EMS officials arrived. No reported syncope. He describes the dizziness as not room spinning and present when sitting and standing, and not worsened with head movement.  Also with blurry vision and nausea without vomiting that has since resolved.   No dysarthria, dysphagia, diplopia,  facial droop, HA, tinnitus, extremity weakness, paraesthesias, changes in mental status,  or gait abnormalities.  No fevers, chills, loss of appetite, abdominal pain, SOB, CP, palpitations, or leg swelling.  He does report generalized weakness and weight loss (8-10lbs in 1 week)  He reports having dizziness on and off for the past 10 years or so but recently worse since July 4th when he had a fall with LOC resulting in hitting his head and a right 8th rib fracture. He has been taking pain medication for his rib fracture, first naproxen for 5 days and recently oxycodone for 3 days or so with not much relief.          Past Medical History  Diagnosis Date  . Hypertension   . Vertigo   . Hyperlipidemia    Past Surgical History  Procedure Laterality Date  . Cardiac bypass    . Lumbar spine surgery    . Inguinal hernia repair      Left   Family History  Problem Relation Age of Onset  . CAD Father   . CAD Mother   . Hypertension Father   . Diabetes Mother   . Breast cancer      Niece   History  Substance Use Topics  .  Smoking status: Former Smoker -- 2.00 packs/day for 40 years    Types: Cigarettes  . Smokeless tobacco: Never Used  . Alcohol Use: Yes     Comment: ocassionally    Review of Systems  HENT: Negative for hearing loss, trouble swallowing and tinnitus.        Pain at back of head where he hit head after LOC  Eyes:       Blurry vision this AM  Respiratory: Negative for shortness of breath.   Cardiovascular: Negative for chest pain, palpitations and leg swelling.  Gastrointestinal: Positive for nausea. Negative for vomiting, abdominal pain, diarrhea and constipation.  Genitourinary: Negative for dysuria, urgency, frequency and difficulty urinating.  Musculoskeletal: Negative for gait problem.  Neurological: Positive for dizziness and weakness. Negative for syncope, facial asymmetry, speech difficulty, light-headedness, numbness and headaches.  Psychiatric/Behavioral: Negative for confusion.    Allergies  Review of patient's allergies indicates no known allergies.  Home Medications   Current Outpatient Rx  Name  Route  Sig  Dispense  Refill  . amLODipine (NORVASC) 10 MG tablet   Oral   Take 1 tablet (10 mg total) by mouth at bedtime.   30 tablet   0   . aspirin EC 81 MG tablet   Oral   Take  81 mg by mouth daily.         . hydrALAZINE (APRESOLINE) 50 MG tablet   Oral   Take 1 tablet (50 mg total) by mouth every 8 (eight) hours.   90 tablet   0   . Omega-3 Fatty Acids (FISH OIL) 1000 MG CAPS   Oral   Take 2,000 mg by mouth daily.         Marland Kitchen oxyCODONE (OXY IR/ROXICODONE) 5 MG immediate release tablet   Oral   Take 1 tablet (5 mg total) by mouth every 4 (four) hours as needed for pain.   30 tablet   0   . pantoprazole (PROTONIX) 40 MG tablet   Oral   Take 1 tablet (40 mg total) by mouth daily at 12 noon.   30 tablet   0   . rosuvastatin (CRESTOR) 20 MG tablet   Oral   Take 20 mg by mouth at bedtime.          BP 137/70  Pulse 90  Temp(Src) 98.3 F (36.8 C)  (Oral)  Resp 16  SpO2 96% Physical Exam  Constitutional: He is oriented to person, place, and time. He appears well-developed and well-nourished. No distress.  HENT:  Head: Normocephalic.  Tenderness to palpation over bump on posterior head  Eyes: EOM are normal. Pupils are equal, round, and reactive to light.  Neck: Normal range of motion. Neck supple.  Cardiovascular: Normal rate, regular rhythm and normal heart sounds.   Pulmonary/Chest: Effort normal and breath sounds normal. No respiratory distress. He has no wheezes. He has no rales. He exhibits tenderness (right sided over fracture rib).  Abdominal: Soft. Bowel sounds are normal. He exhibits no distension. There is no tenderness. There is no rebound and no guarding.  Musculoskeletal:  Decreased range of motion of legs due pain from rib fracture   Neurological: He is alert and oriented to person, place, and time. No cranial nerve deficit.  No slurring of speech No facial droop Extremity strength normal Sensation normal Pronator drift negative    Skin: Skin is warm and dry. He is not diaphoretic. No pallor.  Psychiatric: He has a normal mood and affect.    ED Course  Procedures (including critical care time)   Date: 10/12/2012  Rate: 94  Rhythm: normal sinus rhythm  QRS Axis: left  Intervals: PR prolonged, QT prolonged and QRS prolonged  ST/T Wave abnormalities: normal  Conduction Disutrbances:right bundle branch block  Narrative Interpretation:   Old EKG Reviewed: unchanged  Labs Reviewed  BASIC METABOLIC PANEL - Abnormal; Notable for the following:    Glucose, Bld 139 (*)    BUN 28 (*)    GFR calc non Af Amer 80 (*)    All other components within normal limits  CBC WITH DIFFERENTIAL - Abnormal; Notable for the following:    Neutrophils Relative % 80 (*)    Lymphocytes Relative 11 (*)    All other components within normal limits  TROPONIN I   No results found. 1. Dizziness     MDM  Assessment:  72 yo M  with HTN, HL, thrombocytopenia, CAD s/p CABG and EF 50%, with recent syncope and rib fracture on pain medication who presents with dizziness that began this morning.     Plan:  Dizziness - Arrhythmia vs Heart block vs VBI vs SE of pain medications vs BPPV  -Obtain 12 lead-EKG--->  Normal rate and sinus rhythm, right bundle branch block -Obtain troponin levels ---> negative  -  Perform Epley maneuver --> pt declined due to pain from recent rib fracture -Obtain CBC w/diff --> WNL -Obtain BMP --> hyperglycemia  -Administer 1L bolus NS IV fluids for adequate hydration  -Administer  Dilaudid 1 mg injection for pain    Disposition: Home ---> Etiology of dizziness unclear, most likely due to arrythmia or bradycardia due to recent syncope a week ago that was suspected due to bradycardia. EKG with right bundle branch block and EF 45-50%. In ED nausea and dizziness completely resolved and vital signs stable with EKG unchanged. Will obtain Holter monitor from cardiology in 1 week.       Discharge Instructions:  -Take percocet 5-325mg  Q4hrs for pain (rib fracture) -To follow-up with cardiology to obtain Holter monitor next Monday July 21 and later to have cardiac stress testing       Otis Brace, MD 10/12/12 2028

## 2012-10-12 NOTE — ED Notes (Signed)
Unable to perform orthostatic vital signs at this time due to pt having increased pain with movement. Informed rn lindsay.

## 2012-10-14 ENCOUNTER — Telehealth: Payer: Self-pay | Admitting: *Deleted

## 2012-10-14 NOTE — ED Provider Notes (Signed)
I performed a history and physical examination of  Ricardo Hunt and discussed his management with Dr. Johna Roles. I agree with the history, physical, assessment, and plan of care, with the following exceptions: None I was present for the following procedures: None  Time Spent in Critical Care of the patient: None  Time spent in discussions with the patient and family: 10 minutes  Pt with dizziness, ? Near syncope.  Had recent admission for syncope.  No new meds, no chest pain, dib with this episode. Pt symptoms free. Pt has a Cards holter monitoring set up, and at this time, it is reasonable for patient to continue with the outpatient workup and management. Tele monitoring in the ED for > 4 hours revealed no alarming dysrhythmias.  Geral Tuch    Derwood Kaplan, MD 10/14/12 [phone]

## 2012-10-14 NOTE — Telephone Encounter (Signed)
Received strip from [facility] that documented intermittent complete heart block.  Patient had called 911 for episodes of dizziness.  He is currently undergoing evaluation by Dr Purvis Sheffield that includes holter monitoring.  Dr Johney Frame reviewed who recommended that patient be seen tomorrow in the [place] office. I called and spoke with his daughter who states that she is currently at the beach but will have other family members bring him to the office tomorrow.  She will call the [place] office back if they are unable to provide transportation.

## 2012-10-15 ENCOUNTER — Encounter: Payer: Self-pay | Admitting: Internal Medicine

## 2012-10-15 ENCOUNTER — Encounter: Payer: Self-pay | Admitting: *Deleted

## 2012-10-15 ENCOUNTER — Ambulatory Visit (INDEPENDENT_AMBULATORY_CARE_PROVIDER_SITE_OTHER): Payer: Medicare Other | Admitting: Internal Medicine

## 2012-10-15 VITALS — BP 138/80 | HR 83 | Ht 68.0 in | Wt 188.0 lb

## 2012-10-15 DIAGNOSIS — R55 Syncope and collapse: Secondary | ICD-10-CM

## 2012-10-15 DIAGNOSIS — I442 Atrioventricular block, complete: Secondary | ICD-10-CM

## 2012-10-15 DIAGNOSIS — Z951 Presence of aortocoronary bypass graft: Secondary | ICD-10-CM

## 2012-10-15 DIAGNOSIS — I2510 Atherosclerotic heart disease of native coronary artery without angina pectoris: Secondary | ICD-10-CM

## 2012-10-15 NOTE — Progress Notes (Signed)
Primary Care Physician: Josue Hector, MD Referring Physician: Prentice Docker, MD   Ricardo Hunt is a 72 y.o. male with a h/o coronary artery disease (s/p CABG in 1996), hypertension, and hyperlipidemia.  He was admitted earlier this month with a syncopal spell.  At that time, he was at work, began experiencing dizziness, and then passed out for a few seconds.  His co-workers called 911 and he was transferred to [facility] for further evaluation.  Work up at that time included an echocardiogram which demonstrated an EF of 45-50% with mid and basal inferior wall hypokinesis. There were no arrhythmias noted on telemetry.  He since has followed up with Dr Purvis Sheffield in the office and was placed on a holter monitor which was unrevealing.  This past Sunday, he had more dizziness and called 911.  He was transported to [facility] for evaluation.  ER work up there was unremarkable and he was discharged to home.  EMS strips were unavailable until yesterday.  They were faxed to the hospital and demonstrated transient complete heart block.  The patient was therefore asked to present today for further evaluation.  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  He denies ischemic symptoms.  He has not driven since his syncopal spell on 10/03/12. Today, he denies symptoms of palpitations, chest pain, shortness of breath, orthopnea, PND,  or neurologic sequela.  He has stable mild edema.  The patient is tolerating medications without difficulties and is otherwise without complaint today.   Past Medical History  Diagnosis Date  . Hypertension   . Vertigo   . Hyperlipidemia    Past Surgical History  Procedure Laterality Date  . Cardiac bypass    . Lumbar spine surgery    . Inguinal hernia repair      Left    Current Outpatient Prescriptions  Medication Sig Dispense Refill  . amLODipine (NORVASC) 10 MG tablet Take 1 tablet (10 mg total) by mouth at bedtime.  30 tablet  0  . aspirin EC 81  MG tablet Take 81 mg by mouth daily.      . hydrALAZINE (APRESOLINE) 50 MG tablet Take 1 tablet (50 mg total) by mouth every 8 (eight) hours.  90 tablet  0  . Omega-3 Fatty Acids (FISH OIL) 1000 MG CAPS Take 2,000 mg by mouth daily.      Marland Kitchen oxyCODONE-acetaminophen (PERCOCET) 5-325 MG per tablet Take 2 tablets by mouth every 4 (four) hours as needed for pain.  10 tablet  0  . pantoprazole (PROTONIX) 40 MG tablet Take 1 tablet (40 mg total) by mouth daily at 12 noon.  30 tablet  0  . rosuvastatin (CRESTOR) 20 MG tablet Take 20 mg by mouth at bedtime.       No current facility-administered medications for this visit.    No Known Allergies  History   Social History  . Marital Status: Widowed    Spouse Name: N/A    Number of Children: N/A  . Years of Education: N/A   Occupational History  . Not on file.   Social History Main Topics  . Smoking status: Former Smoker -- 2.00 packs/day for 40 years    Types: Cigarettes  . Smokeless tobacco: Never Used  . Alcohol Use: Yes     Comment: ocassionally  . Drug Use: No  . Sexually Active: Yes   Other Topics Concern  . Not on file   Social History Narrative  . No narrative on file  Family History  Problem Relation Age of Onset  . CAD Father   . CAD Mother   . Hypertension Father   . Diabetes Mother   . Breast cancer      Niece    ROS- All systems are reviewed and negative except as per the HPI above  Physical Exam: Filed Vitals:   10/15/12 1258  BP: 138/80  Pulse: 83  Height: 5\' 8"  (1.727 m)  Weight: 188 lb (85.276 kg)    GEN- The patient is well appearing, alert and oriented x 3 today.   Head- normocephalic, atraumatic Eyes-  Sclera clear, conjunctiva pink Ears- hearing intact Oropharynx- clear Neck- supple, no JVP Lymph- no cervical lymphadenopathy Lungs- Clear to ausculation bilaterally, normal work of breathing Heart- Regular rate and rhythm, no murmurs, rubs or gallops, PMI not laterally displaced GI- soft,  NT, ND, + BS Extremities- no clubbing, cyanosis, trace edema MS- no significant deformity or atrophy Skin- no rash or lesion Psych- euthymic mood, full affect Neuro- strength and sensation are intact  EKG- NSR, RBBB, LAHB Echo reviewed  Assessment and Plan:  1. Transient complete heart block with syncope I have reviewed strips from EMS from 10/12/12 which confirms transient complete heart block with RR intervals of > 4seconds which correspond to his symptoms of presyncope.  No reversible causes have been found.  He has evidence of intrisic conduction system disease with bifascicular block chronically.  I would therefore recommend pacemaker implantation at this time.  Risks, benefits, alternatives to pacemaker implantation were discussed in detail with the patient today. The patient understands that the risks include but are not limited to bleeding, infection, pneumothorax, perforation, tamponade, vascular damage, renal failure, MI, stroke, death,  and lead dislodgement and wishes to proceed. We will therefore schedule the procedure at the next available time.  He is strongly encouraged to not drive in the interim.  He should call 911 should further syncope occur.  2. Mildly reduced EF/ CAD s/p CABG NYHA Class I by symptoms He has a myoview planned.   He has no ischemic symptoms presently.  I do not feel that his AV block is ischemic in nature.  I have spoken with Dr Purvis Sheffield who agrees that we should proceed with PPM at this time.

## 2012-10-15 NOTE — Patient Instructions (Signed)
   Pacemaker insertion tomorrow - see info sheet given Continue all current medications.

## 2012-10-16 ENCOUNTER — Encounter (HOSPITAL_COMMUNITY): Payer: Self-pay | Admitting: *Deleted

## 2012-10-16 ENCOUNTER — Ambulatory Visit (HOSPITAL_COMMUNITY)
Admission: RE | Admit: 2012-10-16 | Discharge: 2012-10-17 | Disposition: A | Discharge status: Home / Self Care | Payer: Medicare Other | Source: Ambulatory Visit | Attending: Internal Medicine | Admitting: Internal Medicine

## 2012-10-16 ENCOUNTER — Encounter (HOSPITAL_COMMUNITY)
Admission: RE | Disposition: A | Discharge status: Home / Self Care | Payer: Self-pay | Source: Ambulatory Visit | Attending: Internal Medicine

## 2012-10-16 DIAGNOSIS — I441 Atrioventricular block, second degree: Secondary | ICD-10-CM

## 2012-10-16 DIAGNOSIS — Z951 Presence of aortocoronary bypass graft: Secondary | ICD-10-CM | POA: Insufficient documentation

## 2012-10-16 DIAGNOSIS — I442 Atrioventricular block, complete: Secondary | ICD-10-CM

## 2012-10-16 DIAGNOSIS — Z79899 Other long term (current) drug therapy: Secondary | ICD-10-CM | POA: Insufficient documentation

## 2012-10-16 DIAGNOSIS — R55 Syncope and collapse: Secondary | ICD-10-CM | POA: Diagnosis present

## 2012-10-16 DIAGNOSIS — E785 Hyperlipidemia, unspecified: Secondary | ICD-10-CM | POA: Insufficient documentation

## 2012-10-16 DIAGNOSIS — I2510 Atherosclerotic heart disease of native coronary artery without angina pectoris: Secondary | ICD-10-CM | POA: Insufficient documentation

## 2012-10-16 DIAGNOSIS — Z950 Presence of cardiac pacemaker: Secondary | ICD-10-CM

## 2012-10-16 DIAGNOSIS — I10 Essential (primary) hypertension: Secondary | ICD-10-CM | POA: Insufficient documentation

## 2012-10-16 HISTORY — PX: PACEMAKER INSERTION: SHX728

## 2012-10-16 HISTORY — PX: PERMANENT PACEMAKER INSERTION: SHX5480

## 2012-10-16 HISTORY — DX: Presence of cardiac pacemaker: Z95.0

## 2012-10-16 HISTORY — DX: Atrioventricular block, second degree: I44.1

## 2012-10-16 LAB — CBC
MCH: 34.1 pg — ABNORMAL HIGH (ref 26.0–34.0)
MCHC: 37 g/dL — ABNORMAL HIGH (ref 30.0–36.0)
MCV: 92.3 fL (ref 78.0–100.0)
Platelets: 226 10*3/uL (ref 150–400)
RBC: 4.95 MIL/uL (ref 4.22–5.81)

## 2012-10-16 LAB — BASIC METABOLIC PANEL
CO2: 25 mEq/L (ref 19–32)
Calcium: 9.8 mg/dL (ref 8.4–10.5)
Creatinine, Ser: 0.87 mg/dL (ref 0.50–1.35)
GFR calc non Af Amer: 83 mL/min — ABNORMAL LOW (ref >90)

## 2012-10-16 LAB — SURGICAL PCR SCREEN: MRSA, PCR: NEGATIVE

## 2012-10-16 SURGERY — PERMANENT PACEMAKER INSERTION
Anesthesia: LOCAL

## 2012-10-16 MED ORDER — HYDRALAZINE HCL 50 MG PO TABS
50.0000 mg | ORAL_TABLET | Freq: Three times a day (TID) | ORAL | Status: DC
  Administered 2012-10-16 – 2012-10-17 (×2): 50 mg via ORAL
  Filled 2012-10-16 (×5): qty 1

## 2012-10-16 MED ORDER — SODIUM CHLORIDE 0.9 % IJ SOLN: 3.0000 mL | INTRAMUSCULAR | Status: DC | PRN

## 2012-10-16 MED ORDER — SODIUM CHLORIDE 0.9 % IJ SOLN: 3.0000 mL | Freq: Two times a day (BID) | INTRAMUSCULAR | Status: DC

## 2012-10-16 MED ORDER — ACETAMINOPHEN 325 MG PO TABS: 325.0000 mg | ORAL_TABLET | ORAL | Status: DC | PRN

## 2012-10-16 MED ORDER — CHLORHEXIDINE GLUCONATE 4 % EX LIQD
60.0000 mL | Freq: Once | CUTANEOUS | Status: DC
  Filled 2012-10-16: qty 60

## 2012-10-16 MED ORDER — SODIUM CHLORIDE 0.9 % IV SOLN: 250.0000 mL | INTRAVENOUS | Status: DC | PRN

## 2012-10-16 MED ORDER — CEFAZOLIN SODIUM-DEXTROSE 2-3 GM-% IV SOLR
2.0000 g | INTRAVENOUS | Status: DC
  Filled 2012-10-16: qty 50

## 2012-10-16 MED ORDER — MIDAZOLAM HCL 5 MG/5ML IJ SOLN
INTRAMUSCULAR | Status: AC
  Filled 2012-10-16: qty 5

## 2012-10-16 MED ORDER — ASPIRIN EC 81 MG PO TBEC
81.0000 mg | DELAYED_RELEASE_TABLET | Freq: Every day | ORAL | Status: DC
  Filled 2012-10-16: qty 1

## 2012-10-16 MED ORDER — CEFAZOLIN SODIUM 1-5 GM-% IV SOLN
1.0000 g | Freq: Four times a day (QID) | INTRAVENOUS | Status: AC
  Administered 2012-10-16 – 2012-10-17 (×3): 1 g via INTRAVENOUS
  Filled 2012-10-16 (×3): qty 50

## 2012-10-16 MED ORDER — AMLODIPINE BESYLATE 10 MG PO TABS
10.0000 mg | ORAL_TABLET | Freq: Every day | ORAL | Status: DC
  Administered 2012-10-16: 21:00:00 10 mg via ORAL
  Filled 2012-10-16 (×2): qty 1

## 2012-10-16 MED ORDER — PANTOPRAZOLE SODIUM 40 MG PO TBEC: 40.0000 mg | DELAYED_RELEASE_TABLET | Freq: Every day | ORAL | Status: DC

## 2012-10-16 MED ORDER — HEPARIN (PORCINE) IN NACL 2-0.9 UNIT/ML-% IJ SOLN
INTRAMUSCULAR | Status: AC
  Filled 2012-10-16: qty 500

## 2012-10-16 MED ORDER — MUPIROCIN 2 % EX OINT
TOPICAL_OINTMENT | Freq: Two times a day (BID) | CUTANEOUS | Status: DC
  Administered 2012-10-16: 1 via NASAL
  Filled 2012-10-16: qty 22

## 2012-10-16 MED ORDER — SODIUM CHLORIDE 0.9 % IV SOLN
INTRAVENOUS | Status: DC
  Administered 2012-10-16: 13:00:00 via INTRAVENOUS

## 2012-10-16 MED ORDER — MUPIROCIN 2 % EX OINT
TOPICAL_OINTMENT | CUTANEOUS | Status: AC
  Filled 2012-10-16: qty 22

## 2012-10-16 MED ORDER — HYDROCODONE-ACETAMINOPHEN 5-325 MG PO TABS: 1.0000 | ORAL_TABLET | ORAL | Status: DC | PRN

## 2012-10-16 MED ORDER — LIDOCAINE HCL (PF) 1 % IJ SOLN
INTRAMUSCULAR | Status: AC
  Filled 2012-10-16: qty 30

## 2012-10-16 MED ORDER — LIDOCAINE HCL (PF) 1 % IJ SOLN
INTRAMUSCULAR | Status: AC
  Filled 2012-10-16: qty 60

## 2012-10-16 MED ORDER — OXYCODONE-ACETAMINOPHEN 5-325 MG PO TABS
2.0000 | ORAL_TABLET | ORAL | Status: DC | PRN
  Administered 2012-10-16 – 2012-10-17 (×2): 2 via ORAL
  Filled 2012-10-16 (×2): qty 2

## 2012-10-16 MED ORDER — ATORVASTATIN CALCIUM 40 MG PO TABS
40.0000 mg | ORAL_TABLET | Freq: Every day | ORAL | Status: DC
  Administered 2012-10-16: 22:00:00 40 mg via ORAL
  Filled 2012-10-16 (×2): qty 1

## 2012-10-16 MED ORDER — SODIUM CHLORIDE 0.9 % IR SOLN
80.0000 mg | Status: DC
  Filled 2012-10-16: qty 2

## 2012-10-16 MED ORDER — ONDANSETRON HCL 4 MG/2ML IJ SOLN
4.0000 mg | Freq: Four times a day (QID) | INTRAMUSCULAR | Status: DC | PRN
  Administered 2012-10-17: 4 mg via INTRAVENOUS
  Filled 2012-10-16: qty 2

## 2012-10-16 NOTE — Interval H&P Note (Signed)
History and Physical Interval Note:  10/16/2012 2:53 PM  Ricardo Hunt  has presented today for surgery, with the diagnosis of CHB  The various methods of treatment have been discussed with the patient and family. After consideration of risks, benefits and other options for treatment, the patient has consented to  Procedure(s): PERMANENT PACEMAKER INSERTION (N/A) as a surgical intervention .  The patient's history has been reviewed, patient examined, no change in status, stable for surgery.  I have reviewed the patient's chart and labs.  Questions were answered to the patient's satisfaction.     Hillis Range

## 2012-10-16 NOTE — Op Note (Signed)
SURGEON:  Hillis Range, MD     PREPROCEDURE DIAGNOSIS:  Symptomatic mobitz II second degree AV block with syncope    POSTPROCEDURE DIAGNOSIS:  Symptomatic mobitz II second degree AV block with syncope     PROCEDURES:   1.   Pacemaker implantation.     INTRODUCTION: Ricardo Hunt is a 72 y.o. male  with a history of symptomatic mobitz II second degree AV block with syncope who presents today for pacemaker implantation.  The patient reports intermittent episodes of dizziness over the past few months. He has had at least one episode of abrupt syncope.  Recently EMS documented transient complete heart block.  He has baseline bifascicular block. No reversible causes have been identified.  The patient therefore presents today for pacemaker implantation.     DESCRIPTION OF PROCEDURE:  Informed written consent was obtained, and  the patient was brought to the electrophysiology lab in a fasting state.  The patient received 1mg  IV Versed as sedation for the procedure today.  The patients left chest was prepped and draped in the usual sterile fashion by the EP lab staff. The skin overlying the left deltopectoral region was infiltrated with lidocaine for local analgesia.  A 4-cm incision was made over the left deltopectoral region.  A left subcutaneous pacemaker pocket was fashioned using a combination of sharp and blunt dissection. Electrocautery was required to assure hemostasis.    RA/RV Lead Placement: The left axillary vein was cannulated.  Through the left axillary vein, a [facility] model [phone] (serial number  [id number]) right atrial lead and a [facility] model 1948- 58 (serial number  [id number]) right ventricular lead were advanced with fluoroscopic visualization into the right atrial appendage and right ventricular apex positions respectively.  Initial atrial lead P- waves measured 3 mV with impedance of 723 ohms and a threshold of 0.8 V at 0.5 msec.  Right ventricular lead R-waves measured 9  mV with an impedance of 772 ohms and a threshold of 0.6 V at 0.5 msec.  Both leads were secured to the pectoralis fascia using #2-0 silk over the suture sleeves.   Device Placement:  The leads were then connected to a [facility] Assurity DR RF model [id number] (serial number  [id number] ) pacemaker.  The pocket was irrigated with copious gentamicin solution.  The pacemaker was then placed into the pocket.  The pocket was then closed in 2 layers with 2.0 Vicryl suture for the subcutaneous and subcuticular layers.  Steri-  Strips and a sterile dressing were then applied.  There were no early apparent complications.     CONCLUSIONS:   1. Successful implantation of a [employer] Assurity DR RF dual-chamber pacemaker for symptomatic mobitz II second degree AV block with syncope  2. No early apparent complications.           Hillis Range, MD 10/16/2012 5:31 PM

## 2012-10-16 NOTE — Discharge Summary (Signed)
ELECTROPHYSIOLOGY PROCEDURE DISCHARGE SUMMARY    Patient ID: Ricardo Hunt,  MRN: [id number], DOB/AGE: 12-11-1940 72 y.o.  Admit date: 10/16/2012 Discharge date: 10/17/2012  Primary Care Physician: Joette Catching, MD Primary Cardiologist: Gardenia Phlegm, MD Electrophysiologist: Hillis Range, MD  Primary Discharge Diagnosis:  Transient complete heart block status post pacemaker implantation this admission  Secondary Discharge Diagnosis:  1.  Coronary artery disease (s/p CABG in 1996) 2.  Hypertension 3.  Hyperlipidemia  Procedures This Admission:  1.  Implantation of a dual chamber pacemaker on 10-16-2012 by Dr Johney Frame.  The patient received a STJ Assurity pacemaker with model number [id number] right atrial and model number 1948 right ventricular leads.  There were no early apparent complications.   2.  CXR on 10-17-2012 demonstrated no ptx, stable leads  Brief HPI: Ricardo Hunt is a 72 y.o. male with a h/o coronary artery disease (s/p CABG in 1996), hypertension, and hyperlipidemia. He was admitted earlier this month with a syncopal spell. At that time, he was at work, began experiencing dizziness, and then passed out for a few seconds. His co-workers called 911 and he was transferred to [facility] for further evaluation. Work up at that time included an echocardiogram which demonstrated an EF of 45-50% with mid and basal inferior wall hypokinesis. There were no arrhythmias noted on telemetry. He since has followed up with Dr Purvis Sheffield in the office and was placed on a holter monitor which was unrevealing. This past Sunday, he had more dizziness and called 911. He was transported to [facility] for evaluation. ER work up there was unremarkable and he was discharged to home. EMS strips were unavailable until yesterday. They were faxed to the hospital and demonstrated transient complete heart block.  Risks, benefits, and alternatives to pacemaker implantation were reviewed with the patient who wished to  proceed.   Hospital Course:  The patient was admitted and underwent implantation of a dual chamber STJ pacemaker with details as outlined above.   He was monitored on telemetry overnight which demonstrated sinus rhythm with occasional atrial pacing.  Left chest was without hematoma or ecchymosis.  The device was interrogated and found to be functioning normally.  CXR was obtained and demonstrated no pneumothorax status post device implantation.  Wound care, arm mobility, and restrictions were reviewed with the patient.  Dr Johney Frame examined the patient and considered them stable for discharge to home.    Discharge Vitals: Blood pressure 155/79, pulse 70, temperature 97.3 F (36.3 C), temperature source Oral, resp. rate 18, height 5\' 8"  (1.727 m), weight 191 lb 12.8 oz (87 kg), SpO2 96.00%.   Physical Exam: Filed Vitals:   10/16/12 2200 10/17/12 0000 10/17/12 0515 10/17/12 0739  BP: 165/74 147/60 150/73 155/79  Pulse: 83 79 67 70  Temp:  98.4 F (36.9 C) 97.9 F (36.6 C) 97.3 F (36.3 C)  TempSrc:  Oral Oral Oral  Resp:  19 20 18   Height:      Weight:   191 lb 12.8 oz (87 kg)   SpO2: 94% 99% 96% 96%    GEN- The patient is well appearing, alert and oriented x 3 today.   Head- normocephalic, atraumatic Eyes-  Sclera clear, conjunctiva pink Ears- hearing intact Oropharynx- clear Neck- supple, no JVP Lymph- no cervical lymphadenopathy Lungs- Clear to ausculation bilaterally, normal work of breathing Chest- pacemaker pocket is without hematoma Heart- Regular rate and rhythm, no murmurs, rubs or gallops, PMI not laterally displaced GI- soft, NT, ND, + BS Extremities-  no clubbing, cyanosis, or edema MS- no significant deformity or atrophy Skin- no rash or lesion Psych- euthymic mood, full affect Neuro- strength and sensation are intact  Pacemaker interrogation- reviewed in detail today    Labs:   Lab Results  Component Value Date   WBC 9.7 10/16/2012   HGB 16.9 10/16/2012   HCT  45.7 10/16/2012   MCV 92.3 10/16/2012   PLT 226 10/16/2012     Recent Labs Lab 10/16/12 1244  NA 140  K 3.8  CL 103  CO2 25  BUN 24*  CREATININE 0.87  CALCIUM 9.8  GLUCOSE 129*    Discharge Medications:    Medication List         amLODipine 10 MG tablet  Commonly known as:  NORVASC  Take 1 tablet (10 mg total) by mouth at bedtime.     aspirin EC 81 MG tablet  Take 81 mg by mouth daily.     Fish Oil 1000 MG Caps  Take 2,000 mg by mouth daily.     hydrALAZINE 50 MG tablet  Commonly known as:  APRESOLINE  Take 1 tablet (50 mg total) by mouth every 8 (eight) hours.     oxyCODONE-acetaminophen 5-325 MG per tablet  Commonly known as:  PERCOCET  Take 2 tablets by mouth every 4 (four) hours as needed for pain.     pantoprazole 40 MG tablet  Commonly known as:  PROTONIX  Take 1 tablet (40 mg total) by mouth daily at 12 noon.     rosuvastatin 20 MG tablet  Commonly known as:  CRESTOR  Take 20 mg by mouth at bedtime.        Disposition:   Future Appointments Provider Department Dept Phone   10/20/2012 10:30 AM [facility] [phone]   10/27/2012 3:00 PM [facility] [employer] Main Office [place]) [phone]   11/03/2012 3:00 PM Laqueta Linden, MD [facility] (near [place]) [phone]     Follow-up Information   Follow up with [facility] On 10/27/2012. (3pm)       Duration of Discharge Encounter: Greater than 30 minutes including physician time.  Signed,  Hillis Range, MD

## 2012-10-16 NOTE — Progress Notes (Signed)
Pt requesting crestor tonight.  Med listed on admission home med list.  Dr. Antoine Poche notified and crestor 20mg  ordered qhs.

## 2012-10-16 NOTE — H&P (View-Only) (Signed)
 Primary Care Physician: NYLAND,LEONARD ROBERT, MD Referring Physician: Suresh Koneswaran, MD   Ricardo Hunt is a 72 y.o. male with a h/o coronary artery disease (s/p CABG in 1996), hypertension, and hyperlipidemia.  He was admitted earlier this month with a syncopal spell.  At that time, he was at work, began experiencing dizziness, and then passed out for a few seconds.  His co-workers called 911 and he was transferred to [facility] for further evaluation.  Work up at that time included an echocardiogram which demonstrated an EF of 45-50% with mid and basal inferior wall hypokinesis. There were no arrhythmias noted on telemetry.  He since has followed up with Dr Koneswaran in the office and was placed on a holter monitor which was unrevealing.  This past Sunday, he had more dizziness and called 911.  He was transported to [facility] for evaluation.  ER work up there was unremarkable and he was discharged to home.  EMS strips were unavailable until yesterday.  They were faxed to the hospital and demonstrated transient complete heart block.  The patient was therefore asked to present today for further evaluation.  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  He denies ischemic symptoms.  He has not driven since his syncopal spell on 10/03/12. Today, he denies symptoms of palpitations, chest pain, shortness of breath, orthopnea, PND,  or neurologic sequela.  He has stable mild edema.  The patient is tolerating medications without difficulties and is otherwise without complaint today.   Past Medical History  Diagnosis Date  . Hypertension   . Vertigo   . Hyperlipidemia    Past Surgical History  Procedure Laterality Date  . Cardiac bypass    . Lumbar spine surgery    . Inguinal hernia repair      Left    Current Outpatient Prescriptions  Medication Sig Dispense Refill  . amLODipine (NORVASC) 10 MG tablet Take 1 tablet (10 mg total) by mouth at bedtime.  30 tablet  0  . aspirin EC 81  MG tablet Take 81 mg by mouth daily.      . hydrALAZINE (APRESOLINE) 50 MG tablet Take 1 tablet (50 mg total) by mouth every 8 (eight) hours.  90 tablet  0  . Omega-3 Fatty Acids (FISH OIL) 1000 MG CAPS Take 2,000 mg by mouth daily.      . oxyCODONE-acetaminophen (PERCOCET) 5-325 MG per tablet Take 2 tablets by mouth every 4 (four) hours as needed for pain.  10 tablet  0  . pantoprazole (PROTONIX) 40 MG tablet Take 1 tablet (40 mg total) by mouth daily at 12 noon.  30 tablet  0  . rosuvastatin (CRESTOR) 20 MG tablet Take 20 mg by mouth at bedtime.       No current facility-administered medications for this visit.    No Known Allergies  History   Social History  . Marital Status: Widowed    Spouse Name: N/A    Number of Children: N/A  . Years of Education: N/A   Occupational History  . Not on file.   Social History Main Topics  . Smoking status: Former Smoker -- 2.00 packs/day for 40 years    Types: Cigarettes  . Smokeless tobacco: Never Used  . Alcohol Use: Yes     Comment: ocassionally  . Drug Use: No  . Sexually Active: Yes   Other Topics Concern  . Not on file   Social History Narrative  . No narrative on file      Family History  Problem Relation Age of Onset  . CAD Father   . CAD Mother   . Hypertension Father   . Diabetes Mother   . Breast cancer      Niece    ROS- All systems are reviewed and negative except as per the HPI above  Physical Exam: Filed Vitals:   10/15/12 1258  BP: 138/80  Pulse: 83  Height: 5' 8" (1.727 m)  Weight: 188 lb (85.276 kg)    GEN- The patient is well appearing, alert and oriented x 3 today.   Head- normocephalic, atraumatic Eyes-  Sclera clear, conjunctiva pink Ears- hearing intact Oropharynx- clear Neck- supple, no JVP Lymph- no cervical lymphadenopathy Lungs- Clear to ausculation bilaterally, normal work of breathing Heart- Regular rate and rhythm, no murmurs, rubs or gallops, PMI not laterally displaced GI- soft,  NT, ND, + BS Extremities- no clubbing, cyanosis, trace edema MS- no significant deformity or atrophy Skin- no rash or lesion Psych- euthymic mood, full affect Neuro- strength and sensation are intact  EKG- NSR, RBBB, LAHB Echo reviewed  Assessment and Plan:  1. Transient complete heart block with syncope I have reviewed strips from EMS from 10/12/12 which confirms transient complete heart block with RR intervals of > 4seconds which correspond to his symptoms of presyncope.  No reversible causes have been found.  He has evidence of intrisic conduction system disease with bifascicular block chronically.  I would therefore recommend pacemaker implantation at this time.  Risks, benefits, alternatives to pacemaker implantation were discussed in detail with the patient today. The patient understands that the risks include but are not limited to bleeding, infection, pneumothorax, perforation, tamponade, vascular damage, renal failure, MI, stroke, death,  and lead dislodgement and wishes to proceed. We will therefore schedule the procedure at the next available time.  He is strongly encouraged to not drive in the interim.  He should call 911 should further syncope occur.  2. Mildly reduced EF/ CAD s/p CABG NYHA Class I by symptoms He has a myoview planned.   He has no ischemic symptoms presently.  I do not feel that his AV block is ischemic in nature.  I have spoken with Dr Koneswaran who agrees that we should proceed with PPM at this time.  

## 2012-10-17 ENCOUNTER — Ambulatory Visit (HOSPITAL_COMMUNITY): Payer: Medicare Other

## 2012-10-17 ENCOUNTER — Encounter (HOSPITAL_COMMUNITY): Payer: Self-pay | Admitting: *Deleted

## 2012-10-17 ENCOUNTER — Telehealth: Payer: Self-pay

## 2012-10-17 DIAGNOSIS — I442 Atrioventricular block, complete: Secondary | ICD-10-CM

## 2012-10-17 MED ORDER — YOU HAVE A PACEMAKER BOOK
Freq: Once | Status: AC
  Administered 2012-10-17: 03:00:00
  Filled 2012-10-17: qty 1

## 2012-10-17 NOTE — Telephone Encounter (Signed)
Patient at daughter house.  Daughter needs clarification on patient instruction, please call.  They are unsure what to do with a wire they received.  Patient also needs to know if 48 hr holter still needs to be done, since he just came home with device?

## 2012-10-20 ENCOUNTER — Ambulatory Visit (HOSPITAL_COMMUNITY): Payer: Medicare Other

## 2012-10-20 ENCOUNTER — Telehealth: Payer: Self-pay | Admitting: Internal Medicine

## 2012-10-20 NOTE — Telephone Encounter (Signed)
See previous phone note.  

## 2012-10-20 NOTE — Telephone Encounter (Signed)
Spoke w/pt in regards to wire that was received. Pt was given cell adapter in hospital and was confused on where to hook it up. Pt has not received transmitter at this time. Instructed pt that transmitter will be shipped out to home address and to hook up cell adapter once transmitter received.

## 2012-10-20 NOTE — Telephone Encounter (Signed)
New problem   pts daughter calling about a cord that she has with pts pacemaker but she says it doesn't connect to anything

## 2012-10-20 NOTE — Telephone Encounter (Signed)
Per Dr. Darl Householder, 48 hour holter and stress test should be cancelled. Daughter informed.

## 2012-10-27 ENCOUNTER — Ambulatory Visit (INDEPENDENT_AMBULATORY_CARE_PROVIDER_SITE_OTHER): Payer: Medicare Other | Admitting: *Deleted

## 2012-10-27 DIAGNOSIS — I442 Atrioventricular block, complete: Secondary | ICD-10-CM

## 2012-10-27 LAB — PACEMAKER DEVICE OBSERVATION
AL AMPLITUDE: 4.1 mv
RV LEAD AMPLITUDE: 8.5 mv
RV LEAD IMPEDENCE PM: 825 Ohm
RV LEAD THRESHOLD: 0.5 V

## 2012-10-27 NOTE — Progress Notes (Signed)
Wound check-PPM in office. 

## 2012-10-30 ENCOUNTER — Telehealth: Payer: Self-pay | Admitting: Internal Medicine

## 2012-10-30 NOTE — Telephone Encounter (Signed)
LMOM for return call//kwm  

## 2012-10-30 NOTE — Telephone Encounter (Signed)
New Prob     Pts daughter would like to know how long can pt expect to be out of work after pacemaker implant. Please call.

## 2012-10-31 NOTE — Telephone Encounter (Signed)
Spoke w/Lisa Bullins in regards to pt returning to work. Pt job is heavy lifting and can not go back to work with restrictions. Per Nehemiah Settle if pt can not go back with restrictions then pt will need to be out of work x 6 weeks.

## 2012-10-31 NOTE — Telephone Encounter (Signed)
LMOM for return call//kwm  

## 2012-11-03 ENCOUNTER — Encounter: Payer: Self-pay | Admitting: Cardiovascular Disease

## 2012-11-03 ENCOUNTER — Encounter: Payer: Self-pay | Admitting: Cardiology

## 2012-11-03 ENCOUNTER — Ambulatory Visit (INDEPENDENT_AMBULATORY_CARE_PROVIDER_SITE_OTHER): Payer: Medicare Other | Admitting: Cardiovascular Disease

## 2012-11-03 VITALS — BP 131/79 | HR 86 | Ht 68.0 in | Wt 188.0 lb

## 2012-11-03 DIAGNOSIS — I10 Essential (primary) hypertension: Secondary | ICD-10-CM

## 2012-11-03 DIAGNOSIS — I2510 Atherosclerotic heart disease of native coronary artery without angina pectoris: Secondary | ICD-10-CM

## 2012-11-03 DIAGNOSIS — I442 Atrioventricular block, complete: Secondary | ICD-10-CM

## 2012-11-03 DIAGNOSIS — Z950 Presence of cardiac pacemaker: Secondary | ICD-10-CM

## 2012-11-03 DIAGNOSIS — Z951 Presence of aortocoronary bypass graft: Secondary | ICD-10-CM

## 2012-11-03 DIAGNOSIS — E7800 Pure hypercholesterolemia, unspecified: Secondary | ICD-10-CM

## 2012-11-03 MED ORDER — CARVEDILOL 3.125 MG PO TABS: 3.1250 mg | ORAL_TABLET | Freq: Two times a day (BID) | ORAL | Status: DC

## 2012-11-03 NOTE — Patient Instructions (Signed)
Your physician recommends that you schedule a follow-up appointment in: 6 months with Dr. Purvis Sheffield . You should receive a letter in the mail in 4 months. If you do not receive a letter by December 2014 call our office to schedule this appointment.   Your physician has recommended you make the following change in your medication: Start Coreg 3.125 twice daily. A prescription has been sent to your pharmacy.   Continue all other medications.   Work note given today.

## 2012-11-03 NOTE — Progress Notes (Signed)
Patient ID: Ricardo Hunt, male   DOB: 08-14-1940, 72 y.o.   MRN: [id number]    SUBJECTIVE: Since Ricardo Hunt last office visit with me, he was found to have transient complete heart block and received a PPM by Dr. Johney Frame.  He feels well and is staying with his daughter. He denies chest wall pain.  Filed Vitals:   11/03/12 1456  BP: 131/79  Pulse: 86  Height: 5\' 8"  (1.727 m)  Weight: 188 lb (85.276 kg)     PHYSICAL EXAM General: NAD Neck: No JVD, no thyromegaly or thyroid nodule.  Lungs: Clear to auscultation bilaterally with normal respiratory effort. CV: Nondisplaced PMI.  Heart regular S1/S2, no S3/S4, no murmur.  No peripheral edema.  No carotid bruit.  Normal pedal pulses. Chest wall shows well-healed incision.  Abdomen: Soft, nontender, no hepatosplenomegaly, no distention.  Neurologic: Alert and oriented x 3.  Psych: Normal affect. Extremities: No clubbing or cyanosis.    LABS: Basic Metabolic Panel: No results found for this basename: NA, K, CL, CO2, GLUCOSE, BUN, CREATININE, CALCIUM, MG, PHOS,  in the last 72 hours Liver Function Tests: No results found for this basename: AST, ALT, ALKPHOS, BILITOT, PROT, ALBUMIN,  in the last 72 hours No results found for this basename: LIPASE, AMYLASE,  in the last 72 hours CBC: No results found for this basename: WBC, NEUTROABS, HGB, HCT, MCV, PLT,  in the last 72 hours Cardiac Enzymes: No results found for this basename: CKTOTAL, CKMB, CKMBINDEX, TROPONINI,  in the last 72 hours BNP: No components found with this basename: POCBNP,  D-Dimer: No results found for this basename: DDIMER,  in the last 72 hours Hemoglobin A1C: No results found for this basename: HGBA1C,  in the last 72 hours Fasting Lipid Panel: No results found for this basename: CHOL, HDL, LDLCALC, TRIG, CHOLHDL, LDLDIRECT,  in the last 72 hours Thyroid Function Tests: No results found for this basename: TSH, T4TOTAL, FREET3, T3FREE, THYROIDAB,  in the last 72  hours Anemia Panel: No results found for this basename: VITAMINB12, FOLATE, FERRITIN, TIBC, IRON, RETICCTPCT,  in the last 72 hours  RADIOLOGY: Dg Chest 2 View  10/17/2012   *RADIOLOGY REPORT*  Clinical Data: Pacemaker insertion.  Post procedure radiograph.  CHEST - 2 VIEW  Comparison: Chest CT 10/03/2012.  Findings: There is an uncomplicated left subclavian pacemaker. Right ventricular apex and right atrial appendage lead appear within normal limits.  CABG/median sternotomy.  Cardiopericardial silhouette is borderline for projection.  No airspace disease. Diffuse nonspecific interstitial prominence is present, which is a chronic finding in this patient.  There is no pleural effusion. No pneumothorax.  IMPRESSION: Uncomplicated left subclavian dual lead cardiac pacemaker placement.   Original Report Authenticated By: Andreas Newport, M.D.      ASSESSMENT AND PLAN: 1. Complete heart block s/p dual chamber PPM by Dr. Johney Frame  2. CAD s/p 3-v CABG: mildly reduced LV systolic function. Will start Coreg 3.125 mg bid. Continue ASA and Crestor.   3. HTN: controlled on Amlodipine and Hydralazine.   4. Hypercholesterolemia: continue Crestor.   Will provide letter for patient to resume work duties in early September, and also obtain some post-pacemaker care information sheets.      Prentice Docker, M.D., F.A.C.C.

## 2012-11-11 ENCOUNTER — Telehealth: Payer: Self-pay | Admitting: *Deleted

## 2012-11-11 NOTE — Telephone Encounter (Signed)
If no problems with GERD, can likely be stopped. Would doublecheck with his PCP.

## 2012-11-11 NOTE — Telephone Encounter (Signed)
Daughter Misty Stanley) notified via voice message.

## 2012-11-11 NOTE — Telephone Encounter (Signed)
Daughter Misty Stanley) left message on voice mail - patient c/o really bad dry mouth, wants to know what she can do.    Left message to return call.

## 2012-11-11 NOTE — Telephone Encounter (Signed)
Daughter Misty Stanley Bullins) wanted to know if her dad should continue the Pantoprazole.  Was not given any refills when he was discharged from hospital.   Informed daughter that message will be sent to provider to confirm if he still needs this medication or not.   [facility])

## 2012-11-12 NOTE — Telephone Encounter (Signed)
Daughter Misty Stanley) returned call.  Explained to daughter that did not really see that any of his medications would cause problems with dry mouth.  Suggested she may try OTC mouth rinses or swabs and/or check to see if PMD has any suggestions.  She verbalized understanding.

## 2012-11-17 ENCOUNTER — Encounter: Payer: Self-pay | Admitting: Internal Medicine

## 2013-01-12 ENCOUNTER — Ambulatory Visit (INDEPENDENT_AMBULATORY_CARE_PROVIDER_SITE_OTHER): Payer: Medicare Other | Admitting: Internal Medicine

## 2013-01-12 ENCOUNTER — Encounter: Payer: Self-pay | Admitting: Internal Medicine

## 2013-01-12 VITALS — BP 160/79 | HR 80 | Ht 68.0 in | Wt 190.8 lb

## 2013-01-12 DIAGNOSIS — I442 Atrioventricular block, complete: Secondary | ICD-10-CM

## 2013-01-12 LAB — PACEMAKER DEVICE OBSERVATION
ATRIAL PACING PM: 3
BAMS-0001: 150 {beats}/min
BATTERY VOLTAGE: 3.0531 V
DEVICE MODEL PM: 7519064
RV LEAD IMPEDENCE PM: 775 Ohm
RV LEAD THRESHOLD: 0.75 V
VENTRICULAR PACING PM: 1

## 2013-01-12 MED ORDER — CARVEDILOL 6.25 MG PO TABS: 6.2500 mg | ORAL_TABLET | Freq: Two times a day (BID) | ORAL | Status: DC

## 2013-01-12 NOTE — Progress Notes (Signed)
PCP: Josue Hector, MD Primary Cardiologist:  Dr Purvis Sheffield  Ricardo Hunt is a 72 y.o. male who presents today for routine electrophysiology followup.  Since having his PPM implanted, the patient reports doing very well.  Today, he denies symptoms of palpitations, chest pain, shortness of breath,  lower extremity edema, dizziness, presyncope, or syncope.  The patient is otherwise without complaint today.   Past Medical History  Diagnosis Date  . Hypertension   . Vertigo   . Hyperlipidemia   . Mobitz (type) II atrioventricular block     s/p STJ Accent pacemaker implanted by Dr Johney Frame 10-16-2012   Past Surgical History  Procedure Laterality Date  . Cardiac bypass    . Lumbar spine surgery    . Inguinal hernia repair      Left  . Pacemaker insertion  10-16-2012    STJ Accent dual chamber pacemaker implanted by Dr Johney Frame for symptomatic Mobitz II heart block    Current Outpatient Prescriptions  Medication Sig Dispense Refill  . amLODipine (NORVASC) 10 MG tablet Take 1 tablet (10 mg total) by mouth at bedtime.  30 tablet  0  . aspirin EC 81 MG tablet Take 81 mg by mouth daily.      . carvedilol (COREG) 3.125 MG tablet Take 1 tablet (3.125 mg total) by mouth 2 (two) times daily.  60 tablet  6  . hydrALAZINE (APRESOLINE) 50 MG tablet Take 1 tablet (50 mg total) by mouth every 8 (eight) hours.  90 tablet  0  . Omega-3 Fatty Acids (FISH OIL) 1000 MG CAPS Take 2,000 mg by mouth daily.      . rosuvastatin (CRESTOR) 20 MG tablet Take 20 mg by mouth at bedtime.       No current facility-administered medications for this visit.    Physical Exam: Filed Vitals:   01/12/13 0947  BP: 160/79  Pulse: 80  Height: 5\' 8"  (1.727 m)  Weight: 190 lb 12.8 oz (86.546 kg)    GEN- The patient is well appearing, alert and oriented x 3 today.   Head- normocephalic, atraumatic Eyes-  Sclera clear, conjunctiva pink Ears- hearing intact Oropharynx- clear Lungs- Clear to ausculation bilaterally,  normal work of breathing Chest- pacemaker pocket is well healed Heart- Regular rate and rhythm, no murmurs, rubs or gallops, PMI not laterally displaced GI- soft, NT, ND, + BS Extremities- no clubbing, cyanosis, or edema  Pacemaker interrogation- reviewed in detail today,  See PACEART report  Assessment and Plan:  1.  Mobitz II second degree AV block Normal pacemaker function See Pace Art report No changes today  2. Chronic systolic dysfunction euvolemic today Increase coreg to 6.25mg  BID This can be further titrated as BP alllows  3. HTN Above goal Increase coreg as above  4. CAD Stable No change required today  [facility] Return in 9 months

## 2013-01-12 NOTE — Patient Instructions (Signed)
   Increase Coreg to 6.25mg  twice a day - new sent to pharm Continue all other medications.   Continue Merlin remote checks - next 04/17/2013 Follow up in  July 2015

## 2013-01-27 ENCOUNTER — Telehealth: Payer: Self-pay | Admitting: Cardiology

## 2013-01-27 NOTE — Telephone Encounter (Signed)
Nurse from Dr. Laural Benes dentist office called and said that patient was in office and was going to have a routine cleaning. She wanted to know if it would be ok if pt had cleaning since having device implanted in July 2014. I informed her that it would be ok for pt to have his teeth cleaned.

## 2013-02-06 ENCOUNTER — Encounter: Payer: Medicare Other | Admitting: Internal Medicine

## 2013-02-13 ENCOUNTER — Encounter: Payer: Medicare Other | Admitting: Internal Medicine

## 2013-04-17 ENCOUNTER — Encounter: Payer: Self-pay | Admitting: Internal Medicine

## 2013-04-17 ENCOUNTER — Ambulatory Visit (INDEPENDENT_AMBULATORY_CARE_PROVIDER_SITE_OTHER): Payer: Medicare Other | Admitting: *Deleted

## 2013-04-17 DIAGNOSIS — I442 Atrioventricular block, complete: Secondary | ICD-10-CM

## 2013-04-21 LAB — MDC_IDC_ENUM_SESS_TYPE_REMOTE
Lead Channel Impedance Value: 460 Ohm
Lead Channel Pacing Threshold Amplitude: 0.75 V
Lead Channel Sensing Intrinsic Amplitude: 4.2 mV
Lead Channel Sensing Intrinsic Amplitude: 7.6 mV
MDC IDC MSMT BATTERY VOLTAGE: 3.04 V
MDC IDC MSMT LEADCHNL RV IMPEDANCE VALUE: 680 Ohm
MDC IDC MSMT LEADCHNL RV PACING THRESHOLD PULSEWIDTH: 0.4 ms
MDC IDC PG SERIAL: 7519064
MDC IDC SET LEADCHNL RA PACING AMPLITUDE: 2 V
MDC IDC SET LEADCHNL RV PACING AMPLITUDE: 1 V
MDC IDC SET LEADCHNL RV PACING PULSEWIDTH: 0.4 ms
MDC IDC SET LEADCHNL RV SENSING SENSITIVITY: 2 mV
MDC IDC STAT BRADY RA PERCENT PACED: 4.7 %
MDC IDC STAT BRADY RV PERCENT PACED: 1 % — AB

## 2013-04-28 ENCOUNTER — Encounter: Payer: Self-pay | Admitting: Internal Medicine

## 2013-04-28 ENCOUNTER — Encounter: Payer: Self-pay | Admitting: *Deleted

## 2013-04-29 ENCOUNTER — Ambulatory Visit (INDEPENDENT_AMBULATORY_CARE_PROVIDER_SITE_OTHER): Payer: Medicare Other | Admitting: Cardiovascular Disease

## 2013-04-29 ENCOUNTER — Encounter: Payer: Self-pay | Admitting: Cardiovascular Disease

## 2013-04-29 VITALS — BP 158/79 | HR 67 | Ht 68.5 in | Wt 186.0 lb

## 2013-04-29 DIAGNOSIS — Z950 Presence of cardiac pacemaker: Secondary | ICD-10-CM

## 2013-04-29 DIAGNOSIS — E7800 Pure hypercholesterolemia, unspecified: Secondary | ICD-10-CM

## 2013-04-29 DIAGNOSIS — I25810 Atherosclerosis of coronary artery bypass graft(s) without angina pectoris: Secondary | ICD-10-CM

## 2013-04-29 DIAGNOSIS — I10 Essential (primary) hypertension: Secondary | ICD-10-CM

## 2013-04-29 MED ORDER — CARVEDILOL 12.5 MG PO TABS: 12.5000 mg | ORAL_TABLET | Freq: Two times a day (BID) | ORAL | Status: DC

## 2013-04-29 MED ORDER — HYDRALAZINE HCL 50 MG PO TABS: 50.0000 mg | ORAL_TABLET | Freq: Two times a day (BID) | ORAL | Status: DC

## 2013-04-29 NOTE — Patient Instructions (Signed)
   Increase Coreg to 12.5mg  twice a day  - new sent to pharm (may take 2 tabs of your 6.25mg  tablets twice a day till finish current supply) Continue all other medications.   Your physician wants you to follow up in: 6 months.  You will receive a reminder letter in the mail one-two months in advance.  If you don't receive a letter, please call our office to schedule the follow up appointment

## 2013-04-29 NOTE — Progress Notes (Signed)
Patient ID: Ricardo Hunt, male   DOB: [birth year], 72 y.o.   MRN: [id number]      SUBJECTIVE: The patient is here with his wife for routine cardiovascular followup. He denies chest pain and palpitations. He's been taking his hydralazine twice daily rather than 3 times daily. He has not had worsening shortness of breath or leg swelling.    No Known Allergies  Current Outpatient Prescriptions  Medication Sig Dispense Refill  . amLODipine (NORVASC) 10 MG tablet Take 1 tablet (10 mg total) by mouth at bedtime.  30 tablet  0  . aspirin EC 81 MG tablet Take 81 mg by mouth daily.      . carvedilol (COREG) 6.25 MG tablet Take 1 tablet (6.25 mg total) by mouth 2 (two) times daily.  60 tablet  6  . hydrALAZINE (APRESOLINE) 50 MG tablet Take 1 tablet (50 mg total) by mouth every 8 (eight) hours.  90 tablet  0  . Omega-3 Fatty Acids (FISH OIL) 1000 MG CAPS Take 2,000 mg by mouth daily.      . rosuvastatin (CRESTOR) 20 MG tablet Take 20 mg by mouth at bedtime.       No current facility-administered medications for this visit.    Past Medical History  Diagnosis Date  . Hypertension   . Vertigo   . Hyperlipidemia   . Mobitz (type) II atrioventricular block     s/p STJ Accent pacemaker implanted by Dr [name] 10-16-2012    Past Surgical History  Procedure Laterality Date  . Cardiac bypass    . Lumbar spine surgery    . Inguinal hernia repair      Left  . Pacemaker insertion  10-16-2012    STJ Accent dual chamber pacemaker implanted by Dr [name] for symptomatic Mobitz II heart block    History   Social History  . Marital Status: Widowed    Spouse Name: N/A    Number of Children: N/A  . Years of Education: N/A   Occupational History  . Not on file.   Social History Main Topics  . Smoking status: Former Smoker -- 2.00 packs/day for 40 years    Types: Cigarettes  . Smokeless tobacco: Never Used  . Alcohol Use: Yes     Comment: ocassionally  . Drug Use: No  . Sexual Activity: Yes     Other Topics Concern  . Not on file   Social History Narrative  . No narrative on file     Filed Vitals:   04/29/13 1144  BP: 158/79  Pulse: 67  Height: 5' 8.5" (1.74 m)  Weight: 186 lb (84.369 kg)  SpO2: 96%    PHYSICAL EXAM General: NAD Neck: No JVD, no thyromegaly or thyroid nodule.  Lungs: Clear to auscultation bilaterally with normal respiratory effort. CV: Nondisplaced PMI.  Heart regular S1/S2, no S3/S4, no murmur.  No peripheral edema.  No carotid bruit.  Normal pedal pulses.  Abdomen: Soft, nontender, no hepatosplenomegaly, no distention.  Neurologic: Alert and oriented x 3.  Psych: Normal affect. Extremities: No clubbing or cyanosis.   ECG: reviewed and available in electronic records.      ASSESSMENT AND PLAN: 1. Complete heart block s/p dual chamber PPM by Dr. [name]  2. CAD s/p 3-v CABG: mildly reduced LV systolic function. Given increased BP, will increase Coreg to 12.5 mg bid, along with ASA and Crestor.  3. HTN: uncontrolled today. On Amlodipine and hydralazine (taking 50 mg bid rather than tid). Will increase Coreg.  4. Hypercholesterolemia: will see if lipids were recently checked by PCP. If not, will check lipids and continue Crestor in the meantime.   Dispo: f/u 6 months.  Kate Sable, M.D., F.A.C.C.

## 2013-05-04 ENCOUNTER — Ambulatory Visit (AMBULATORY_SURGERY_CENTER): Payer: Self-pay

## 2013-05-04 VITALS — Ht 68.0 in | Wt 188.0 lb

## 2013-05-04 DIAGNOSIS — K921 Melena: Secondary | ICD-10-CM

## 2013-05-04 MED ORDER — SUPREP BOWEL PREP KIT 17.5-3.13-1.6 GM/177ML PO SOLN: 1.0000 | Freq: Once | ORAL | Status: DC

## 2013-05-20 ENCOUNTER — Telehealth: Payer: Self-pay | Admitting: Internal Medicine

## 2013-05-20 NOTE — Telephone Encounter (Signed)
no

## 2013-05-22 ENCOUNTER — Encounter: Payer: Medicare Other | Admitting: Internal Medicine

## 2013-07-02 ENCOUNTER — Encounter: Payer: Medicare Other | Admitting: Internal Medicine

## 2013-07-20 ENCOUNTER — Encounter: Payer: Self-pay | Admitting: Internal Medicine

## 2013-07-20 ENCOUNTER — Ambulatory Visit (INDEPENDENT_AMBULATORY_CARE_PROVIDER_SITE_OTHER): Payer: Medicare Other | Admitting: *Deleted

## 2013-07-20 DIAGNOSIS — I442 Atrioventricular block, complete: Secondary | ICD-10-CM

## 2013-07-22 LAB — MDC_IDC_ENUM_SESS_TYPE_REMOTE
Battery Remaining Longevity: 133 mo
Battery Voltage: 3.04 V
Brady Statistic AS VS Percent: 90 %
Brady Statistic RV Percent Paced: 1.8 %
Date Time Interrogation Session: 20150420060018
Lead Channel Impedance Value: 430 Ohm
Lead Channel Pacing Threshold Amplitude: 0.75 V
Lead Channel Pacing Threshold Amplitude: 1 V
Lead Channel Sensing Intrinsic Amplitude: 6.9 mV
Lead Channel Setting Pacing Amplitude: 1 V
Lead Channel Setting Pacing Pulse Width: 0.4 ms
MDC IDC MSMT LEADCHNL RA PACING THRESHOLD PULSEWIDTH: 0.4 ms
MDC IDC MSMT LEADCHNL RA SENSING INTR AMPL: 3.4 mV
MDC IDC MSMT LEADCHNL RV IMPEDANCE VALUE: 660 Ohm
MDC IDC MSMT LEADCHNL RV PACING THRESHOLD PULSEWIDTH: 0.4 ms
MDC IDC PG SERIAL: 7519064
MDC IDC SET LEADCHNL RA PACING AMPLITUDE: 2 V
MDC IDC SET LEADCHNL RV SENSING SENSITIVITY: 2 mV
MDC IDC STAT BRADY AP VP PERCENT: 1 %
MDC IDC STAT BRADY AP VS PERCENT: 8.1 %
MDC IDC STAT BRADY AS VP PERCENT: 1 %
MDC IDC STAT BRADY RA PERCENT PACED: 8.2 %

## 2013-07-28 ENCOUNTER — Encounter: Payer: Self-pay | Admitting: *Deleted

## 2013-08-03 NOTE — Progress Notes (Signed)
PPM remote 

## 2013-08-12 ENCOUNTER — Telehealth: Payer: Self-pay | Admitting: Internal Medicine

## 2013-08-12 NOTE — Telephone Encounter (Signed)
PV was more than 2 months ago.  Pt scheduled for PV 5/20. Daughter aware of appt.

## 2013-08-19 ENCOUNTER — Telehealth: Payer: Self-pay | Admitting: *Deleted

## 2013-08-19 ENCOUNTER — Ambulatory Visit (AMBULATORY_SURGERY_CENTER): Payer: Self-pay | Admitting: *Deleted

## 2013-08-19 VITALS — Ht 68.0 in | Wt 193.0 lb

## 2013-08-19 DIAGNOSIS — K921 Melena: Secondary | ICD-10-CM

## 2013-08-19 NOTE — Telephone Encounter (Signed)
Dr Carlean Purl:  Pt referred by Dr Edrick Oh for hem positive stools.  Last colonoscopy 02/01/2005 with Dr Gala Romney.  Report is in EPIC under Notes to review. Pathology report also in EPIC. Is pt okay for colonoscopy now?  Thanks, Juliann Pulse

## 2013-08-19 NOTE — Progress Notes (Signed)
No allergies to eggs or soy. No problems with anesthesia.  Pt given Emmi instructions for colonoscopy  No oxygen use  No diet drug use  

## 2013-08-19 NOTE — Telephone Encounter (Signed)
Pt aware to proceed with colonoscopy as planned

## 2013-08-19 NOTE — Telephone Encounter (Signed)
OK for direct colonoscopy

## 2013-08-25 ENCOUNTER — Encounter: Payer: Self-pay | Admitting: Internal Medicine

## 2013-08-25 ENCOUNTER — Ambulatory Visit (AMBULATORY_SURGERY_CENTER): Payer: Medicare Other | Admitting: Internal Medicine

## 2013-08-25 VITALS — BP 138/72 | HR 61 | Temp 97.2°F | Resp 21 | Ht 68.0 in | Wt 193.0 lb

## 2013-08-25 DIAGNOSIS — D126 Benign neoplasm of colon, unspecified: Secondary | ICD-10-CM

## 2013-08-25 DIAGNOSIS — K921 Melena: Secondary | ICD-10-CM

## 2013-08-25 DIAGNOSIS — K5730 Diverticulosis of large intestine without perforation or abscess without bleeding: Secondary | ICD-10-CM

## 2013-08-25 MED ORDER — SODIUM CHLORIDE 0.9 % IV SOLN: 500.0000 mL | INTRAVENOUS | Status: DC

## 2013-08-25 NOTE — Patient Instructions (Addendum)
I found and removed 9 polyps and destroyed one other polyp.  I will let you know pathology results and when to have another routine colonoscopy by mail.  You also have a condition called diverticulosis - common and not usually a problem. Please read the handout provided.  I appreciate the opportunity to care for you. Gatha Mayer, MD, FACG  YOU HAD AN ENDOSCOPIC PROCEDURE TODAY AT [facility]: Refer to the procedure report that was given to you for any specific questions about what was found during the examination.  If the procedure report does not answer your questions, please call your gastroenterologist to clarify.  If you requested that your care partner not be given the details of your procedure findings, then the procedure report has been included in a sealed envelope for you to review at your convenience later.  YOU SHOULD EXPECT: Some feelings of bloating in the abdomen. Passage of more gas than usual.  Walking can help get rid of the air that was put into your GI tract during the procedure and reduce the bloating. If you had a lower endoscopy (such as a colonoscopy or flexible sigmoidoscopy) you may notice spotting of blood in your stool or on the toilet paper. If you underwent a bowel prep for your procedure, then you may not have a normal bowel movement for a few days.  DIET: Your first meal following the procedure should be a light meal and then it is ok to progress to your normal diet.  A half-sandwich or bowl of soup is an example of a good first meal.  Heavy or fried foods are harder to digest and may make you feel nauseous or bloated.  Likewise meals heavy in dairy and vegetables can cause extra gas to form and this can also increase the bloating.  Drink plenty of fluids but you should avoid alcoholic beverages for 24 hours.  ACTIVITY: Your care partner should take you home directly after the procedure.  You should plan to take it easy, moving slowly for the rest of  the day.  You can resume normal activity the day after the procedure however you should NOT DRIVE or use heavy machinery for 24 hours (because of the sedation medicines used during the test).    SYMPTOMS TO REPORT IMMEDIATELY: A gastroenterologist can be reached at any hour.  During normal business hours, 8:30 AM to 5:00 PM Monday through Friday, call [phone].  After hours and on weekends, please call the GI answering service at [phone] who will take a message and have the physician on call contact you.   Following lower endoscopy (colonoscopy or flexible sigmoidoscopy):  Excessive amounts of blood in the stool  Significant tenderness or worsening of abdominal pains  Swelling of the abdomen that is new, acute  Fever of 100F or higher  FOLLOW UP: If any biopsies were taken you will be contacted by phone or by letter within the next 1-3 weeks.  Call your gastroenterologist if you have not heard about the biopsies in 3 weeks.  Our staff will call the home number listed on your records the next business day following your procedure to check on you and address any questions or concerns that you may have at that time regarding the information given to you following your procedure. This is a courtesy call and so if there is no answer at the home number and we have not heard from you through the emergency physician on call, we  will assume that you have returned to your regular daily activities without incident.  SIGNATURES/CONFIDENTIALITY: You and/or your care partner have signed paperwork which will be entered into your electronic medical record.  These signatures attest to the fact that that the information above on your After Visit Summary has been reviewed and is understood.  Full responsibility of the confidentiality of this discharge information lies with you and/or your care-partner.  No NASIDS NOR ASPRIN for 2 weeks to prevent bleeding.

## 2013-08-25 NOTE — Progress Notes (Signed)
Called to room to assist during endoscopic procedure.  Patient ID and intended procedure confirmed with present staff. Received instructions for my participation in the procedure from the performing physician.  

## 2013-08-25 NOTE — Op Note (Signed)
[place]  [address] [place], [address]   COLONOSCOPY PROCEDURE REPORT  PATIENT: [name], [name]  MR#: [id number] BIRTHDATE: 04/21/1940 , 29  yrs. old GENDER: Male ENDOSCOPIST: Gatha Mayer, MD, [facility] REFERRED TL:[id number] Edrick Oh, M.D. PROCEDURE DATE:  08/25/2013 PROCEDURE:   Colonoscopy with snare polypectomy First Screening Colonoscopy - Avg.  risk and is 50 yrs.  old or older - No.  Prior Negative Screening - Now for repeat screening. N/A  History of Adenoma - Now for follow-up colonoscopy & has been > or = to 3 yrs.  N/A  Polyps Removed Today? Yes. ASA CLASS:   Class III INDICATIONS:heme-positive stool. MEDICATIONS: propofol (Diprivan) 200mg  IV, MAC sedation, administered by CRNA, and These medications were titrated to patient response per physician's verbal order  DESCRIPTION OF PROCEDURE:   After the risks benefits and alternatives of the procedure were thoroughly explained, informed consent was obtained.  A digital rectal exam revealed no abnormalities of the rectum, A digital rectal exam revealed no prostatic nodules, and A digital rectal exam revealed the prostate was not enlarged.   The LB [id number]  endoscope was introduced through the anus and advanced to the cecum, which was identified by both the appendix and ileocecal valve. No adverse events experienced.   The quality of the prep was Suprep good  The instrument was then slowly withdrawn as the colon was fully examined.      COLON FINDINGS: Ten sessile polyps measuring 2-10 mm in size were found at the cecum (2), in the ascending colon (3), transverse colon(1), descending colon (2), and sigmoid colon (2).  A polypectomy was performed with a cold snare and using snare cautery.  One diminutive sigmoid polyp was destroyed with tip cautery and one diminutive polyp was not recovered. The resection was complete and the polyp tissue was completely retrieved except as noted..   Moderate  diverticulosis was noted in the sigmoid colon.   The colon mucosa was otherwise normal.   A right colon retroflexion was performed.  Retroflexed views revealed no abnormalities. The time to cecum=2 minutes 39 seconds.  Withdrawal time=21 minutes 19 seconds.  The scope was withdrawn and the procedure completed. COMPLICATIONS: There were no complications.  ENDOSCOPIC IMPRESSION: 1.   Ten sessile polyps measuring 2-10 mm in size were found at the cecum, in the ascending colon, transverse colon, descending colon, and sigmoid colon; polypectomy was performed with a cold snare and using snare cautery, one polyp destroyed. 2.   Moderate diverticulosis was noted in the sigmoid colon 3.   The colon mucosa was otherwise normal - good prep  RECOMMENDATIONS: 1.  Hold aspirin, aspirin products, and anti-inflammatory medication for 2 weeks. 2.  Timing of repeat colonoscopy will be determined by pathology findings.   eSigned:  Gatha Mayer, MD, [facility] 08/25/2013 11:11 AM   cc: Dione Housekeeper, MD and The Patient   PATIENT NAME:  Ricardo Hunt, Ricardo Hunt MR#: [id number]

## 2013-08-26 ENCOUNTER — Telehealth: Payer: Self-pay | Admitting: *Deleted

## 2013-08-26 NOTE — Telephone Encounter (Signed)
No answer, message left for the patient. 

## 2013-08-31 ENCOUNTER — Encounter: Payer: Self-pay | Admitting: Internal Medicine

## 2013-08-31 DIAGNOSIS — Z860100 Personal history of colon polyps, unspecified: Secondary | ICD-10-CM

## 2013-08-31 HISTORY — DX: Personal history of colon polyps, unspecified: Z86.0100

## 2013-08-31 HISTORY — DX: Personal history of colonic polyps: Z86.010

## 2013-08-31 NOTE — Progress Notes (Signed)
Quick Note:  10 adenomas - repeat colon 2016 (1 year) ______

## 2013-09-01 HISTORY — PX: NASAL SEPTUM SURGERY: SHX37

## 2013-10-22 ENCOUNTER — Encounter: Payer: Self-pay | Admitting: *Deleted

## 2013-10-30 ENCOUNTER — Ambulatory Visit: Payer: Medicare Other | Admitting: Cardiovascular Disease

## 2013-11-12 ENCOUNTER — Ambulatory Visit: Payer: Medicare Other | Admitting: Cardiovascular Disease

## 2013-11-27 ENCOUNTER — Encounter: Payer: Medicare Other | Admitting: Internal Medicine

## 2013-12-08 ENCOUNTER — Ambulatory Visit: Payer: Medicare Other | Admitting: Cardiovascular Disease

## 2013-12-08 ENCOUNTER — Other Ambulatory Visit: Payer: Self-pay | Admitting: *Deleted

## 2013-12-08 MED ORDER — CARVEDILOL 12.5 MG PO TABS: 12.5000 mg | ORAL_TABLET | Freq: Two times a day (BID) | ORAL | Status: DC

## 2013-12-09 ENCOUNTER — Ambulatory Visit: Payer: Medicare Other | Admitting: Cardiovascular Disease

## 2014-01-01 ENCOUNTER — Encounter: Payer: Self-pay | Admitting: Cardiovascular Disease

## 2014-01-01 ENCOUNTER — Ambulatory Visit (INDEPENDENT_AMBULATORY_CARE_PROVIDER_SITE_OTHER): Payer: Medicare Other | Admitting: Cardiovascular Disease

## 2014-01-01 VITALS — BP 92/55 | HR 64 | Ht 68.0 in | Wt 188.5 lb

## 2014-01-01 DIAGNOSIS — E785 Hyperlipidemia, unspecified: Secondary | ICD-10-CM

## 2014-01-01 DIAGNOSIS — I9589 Other hypotension: Secondary | ICD-10-CM

## 2014-01-01 DIAGNOSIS — Z950 Presence of cardiac pacemaker: Secondary | ICD-10-CM

## 2014-01-01 DIAGNOSIS — I10 Essential (primary) hypertension: Secondary | ICD-10-CM

## 2014-01-01 DIAGNOSIS — I442 Atrioventricular block, complete: Secondary | ICD-10-CM

## 2014-01-01 DIAGNOSIS — I25810 Atherosclerosis of coronary artery bypass graft(s) without angina pectoris: Secondary | ICD-10-CM

## 2014-01-01 MED ORDER — HYDRALAZINE HCL 25 MG PO TABS: 25.0000 mg | ORAL_TABLET | Freq: Two times a day (BID) | ORAL | Status: DC

## 2014-01-01 NOTE — Progress Notes (Signed)
Patient ID: Ricardo Hunt, male   DOB: 09-27-1940, 73 y.o.   MRN: [id number]      SUBJECTIVE: The patient presents for routine cardiovascular followup. He is doing well and denies chest pain, shortness of breath, palpitations, orthopnea and PND. He very seldom has episodic dizziness which spontaneously resolves. He has no complaints. ECG performed in the office today demonstrates normal sinus rhythm with bifascicular block and occasional PVCs with LVH and repolarization changes and precordial T wave inversions.  Review of Systems: As per "subjective", otherwise negative.  No Known Allergies  Current Outpatient Prescriptions  Medication Sig Dispense Refill  . amLODipine (NORVASC) 10 MG tablet Take 1 tablet (10 mg total) by mouth at bedtime.  30 tablet  0  . aspirin EC 81 MG tablet Take 81 mg by mouth daily.      . carvedilol (COREG) 12.5 MG tablet Take 1 tablet (12.5 mg total) by mouth 2 (two) times daily.  60 tablet  6  . hydrALAZINE (APRESOLINE) 50 MG tablet Take 1 tablet (50 mg total) by mouth 2 (two) times daily.      . Omega-3 Fatty Acids (FISH OIL) 1000 MG CAPS Take 2,000 mg by mouth daily.      . rosuvastatin (CRESTOR) 20 MG tablet Take 20 mg by mouth at bedtime.       No current facility-administered medications for this visit.    Past Medical History  Diagnosis Date  . Hypertension   . Vertigo   . Hyperlipidemia   . Mobitz (type) II atrioventricular block     s/p STJ Accent pacemaker implanted by Dr [name] 10-16-2012  . Pacemaker 10/16/2012  . Personal history of colonic polyps-tubular adenomas 08/31/2013    Past Surgical History  Procedure Laterality Date  . Cardiac bypass  1996    3 vessels  . Lumbar spine surgery  2006  . Inguinal hernia repair Left 2010  . Pacemaker insertion  10-16-2012    STJ Accent dual chamber pacemaker implanted by Dr [name] for symptomatic Mobitz II heart block  . Cataract extraction w/ intraocular lens  implant, bilateral Bilateral 2010  .  Nasal septum surgery  09/01/13    Dr. Adriana Reams    History   Social History  . Marital Status: Widowed    Spouse Name: N/A    Number of Children: N/A  . Years of Education: N/A   Occupational History  . Not on file.   Social History Main Topics  . Smoking status: Former Smoker -- 2.00 packs/day for 40 years    Types: Cigarettes    Quit date: 04/02/1994  . Smokeless tobacco: Never Used  . Alcohol Use: Yes     Comment: rare 01/01/14 occasionally- A  . Drug Use: No  . Sexual Activity: Yes   Other Topics Concern  . Not on file   Social History Narrative  . No narrative on file     Filed Vitals:   01/01/14 0900  Height: 5\' 8"  (1.727 m)  Weight: 188 lb 8 oz (85.503 kg)   BP 92/55  Pulse 64  SpO2 94%    PHYSICAL EXAM General: NAD HEENT: Normal. Neck: No JVD, no thyromegaly. Lungs: Clear to auscultation bilaterally with normal respiratory effort. CV: Nondisplaced PMI.  Regular rate and rhythm, normal S1/S2, no S3/S4, no murmur. No pretibial or periankle edema.  No carotid bruit.  Normal pedal pulses.  Abdomen: Soft, nontender, no hepatosplenomegaly, no distention.  Neurologic: Alert and oriented x 3.  Psych: Normal  affect. Skin: Normal. Musculoskeletal: Normal range of motion, no gross deformities. Extremities: No clubbing or cyanosis.   ECG: Most recent ECG reviewed.      ASSESSMENT AND PLAN: 1. Complete heart block s/p dual chamber PPM: Normal device function by interrogation on 07/22/2013. There were two mode switches lasting 4-16 seconds. No high ventricular rates were noted.  2. CAD s/p 3-v CABG: Stable ischemic heart disease. Continue ASA, statin, and Coreg. 3. Essential HTN: Hypotensive today. Will reduce hydralazine to 25 mg twice daily. He is asymptomatic. 4. Hypercholesterolemia: Will obtain copy of lipids from PCP and continue Crestor 20 mg.   Dispo: f/u 6 months.   Kate Sable, M.D., F.A.C.C.

## 2014-01-01 NOTE — Patient Instructions (Signed)
   Decrease Hydralazine to 25mg  twice a day  - printed script given today Continue all other medications.   Your physician wants you to follow up in: 6 months.  You will receive a reminder letter in the mail one-two months in advance.  If you don't receive a letter, please call our office to schedule the follow up appointment

## 2014-01-08 ENCOUNTER — Other Ambulatory Visit: Payer: Self-pay

## 2014-01-11 ENCOUNTER — Encounter: Payer: Self-pay | Admitting: Internal Medicine

## 2014-01-11 ENCOUNTER — Ambulatory Visit (INDEPENDENT_AMBULATORY_CARE_PROVIDER_SITE_OTHER): Payer: Medicare Other | Admitting: Internal Medicine

## 2014-01-11 VITALS — BP 142/78 | HR 66 | Ht 68.0 in | Wt 187.0 lb

## 2014-01-11 DIAGNOSIS — I442 Atrioventricular block, complete: Secondary | ICD-10-CM

## 2014-01-11 DIAGNOSIS — I10 Essential (primary) hypertension: Secondary | ICD-10-CM

## 2014-01-11 DIAGNOSIS — Z951 Presence of aortocoronary bypass graft: Secondary | ICD-10-CM

## 2014-01-11 LAB — MDC_IDC_ENUM_SESS_TYPE_INCLINIC
Brady Statistic RA Percent Paced: 9.9 %
Date Time Interrogation Session: 20151012141255
Implantable Pulse Generator Model: 2240
Lead Channel Impedance Value: 450 Ohm
Lead Channel Pacing Threshold Amplitude: 0.75 V
Lead Channel Pacing Threshold Pulse Width: 0.4 ms
Lead Channel Pacing Threshold Pulse Width: 0.4 ms
Lead Channel Sensing Intrinsic Amplitude: 4.6 mV
Lead Channel Sensing Intrinsic Amplitude: 8.1 mV
Lead Channel Setting Pacing Amplitude: 1 V
Lead Channel Setting Sensing Sensitivity: 2 mV
MDC IDC MSMT BATTERY REMAINING LONGEVITY: 136.8 mo
MDC IDC MSMT BATTERY VOLTAGE: 3.02 V
MDC IDC MSMT LEADCHNL RA PACING THRESHOLD AMPLITUDE: 0.75 V
MDC IDC MSMT LEADCHNL RV IMPEDANCE VALUE: 675 Ohm
MDC IDC MSMT LEADCHNL RV PACING THRESHOLD AMPLITUDE: 0.75 V
MDC IDC MSMT LEADCHNL RV PACING THRESHOLD PULSEWIDTH: 0.4 ms
MDC IDC PG SERIAL: 7519064
MDC IDC SET LEADCHNL RA PACING AMPLITUDE: 2 V
MDC IDC SET LEADCHNL RV PACING PULSEWIDTH: 0.4 ms
MDC IDC STAT BRADY RV PERCENT PACED: 2.6 %

## 2014-01-11 NOTE — Patient Instructions (Addendum)
Your physician wants you to follow-up in: 12 months with Dr Vallery Ridge will receive a reminder letter in the mail two months in advance. If you don't receive a letter, please call our office to schedule the follow-up appointment.  Remote monitoring is used to monitor your Pacemaker or ICD from home. This monitoring reduces the number of office visits required to check your device to one time per year. It allows [place] to keep an eye on the functioning of your device to ensure it is working properly. You are scheduled for a device check from home on 04/14/14. You may send your transmission at any time that day. If you have a wireless device, the transmission will be sent automatically. After your physician reviews your transmission, you will receive a postcard with your next transmission date.

## 2014-01-11 NOTE — Progress Notes (Signed)
PCP: Sherrie Mustache, MD Primary Cardiologist:  Dr Bronson Ing  Ricardo Hunt is a 73 y.o. male who presents today for routine electrophysiology followup.  Since his last visit, the patient reports doing very well.  Today, he denies symptoms of palpitations, chest pain, shortness of breath,  lower extremity edema, presyncope, or syncope.  He snores and does not rest well at night  Dr Edrick Oh is following.  The patient is otherwise without complaint today.   Past Medical History  Diagnosis Date  . Hypertension   . Vertigo   . Hyperlipidemia   . Mobitz (type) II atrioventricular block     s/p STJ Accent pacemaker implanted by Dr [name] 10-16-2012  . Pacemaker 10/16/2012  . Personal history of colonic polyps-tubular adenomas 08/31/2013   Past Surgical History  Procedure Laterality Date  . Cardiac bypass  1996    3 vessels  . Lumbar spine surgery  2006  . Inguinal hernia repair Left 2010  . Pacemaker insertion  10-16-2012    STJ Accent dual chamber pacemaker implanted by Dr [name] for symptomatic Mobitz II heart block  . Cataract extraction w/ intraocular lens  implant, bilateral Bilateral 2010  . Nasal septum surgery  09/01/13    Dr. Adriana Reams    Current Outpatient Prescriptions  Medication Sig Dispense Refill  . amLODipine (NORVASC) 10 MG tablet Take 1 tablet (10 mg total) by mouth at bedtime.  30 tablet  0  . aspirin EC 81 MG tablet Take 81 mg by mouth daily.      . carvedilol (COREG) 12.5 MG tablet Take 1 tablet (12.5 mg total) by mouth 2 (two) times daily.  60 tablet  6  . hydrALAZINE (APRESOLINE) 25 MG tablet Take 1 tablet (25 mg total) by mouth 2 (two) times daily.  60 tablet  6  . Omega-3 Fatty Acids (FISH OIL) 1000 MG CAPS Take 1,000 mg by mouth 2 (two) times daily.       . rosuvastatin (CRESTOR) 20 MG tablet Take 20 mg by mouth at bedtime.       No current facility-administered medications for this visit.   ROS- all systems are reviewedd and negative except as  above  Physical Exam: Filed Vitals:   01/11/14 1006  BP: 142/78  Pulse: 66  Height: 5\' 8"  (1.727 m)  Weight: 187 lb (84.823 kg)    GEN- The patient is well appearing, alert and oriented x 3 today.   Head- normocephalic, atraumatic Eyes-  Sclera clear, conjunctiva pink Ears- hearing intact Oropharynx- clear Lungs- Clear to ausculation bilaterally, normal work of breathing Chest- pacemaker pocket is well healed Heart- Regular rate and rhythm, no murmurs, rubs or gallops, PMI not laterally displaced GI- soft, NT, ND, + BS Extremities- no clubbing, cyanosis, or edema  Pacemaker interrogation- reviewed in detail today,  See PACEART report  Assessment and Plan:  1.  Mobitz II second degree AV block Normal pacemaker function See Pace Art report No changes today  2. Chronic systolic dysfunction euvolemic today Followed by Dr Bronson Ing  3. HTN Stable No change required today  4. CAD Stable No change required today  [facility] Return in 12 months

## 2014-02-22 ENCOUNTER — Telehealth: Payer: Self-pay | Admitting: *Deleted

## 2014-02-22 NOTE — Telephone Encounter (Signed)
Daughter returned my call. I asked her about pt's symptoms. She states that he hasn't had any SOB or edema---he's still working 12 hour days. She will follow up with him and call back if he has any c/o that she is unaware of. She did say that he's been c/o of vomiting and diarrhea that that began last week. I encouraged her to contact pt's PCP about GI symptoms. Daughter voiced understanding.

## 2014-03-01 ENCOUNTER — Encounter: Payer: Self-pay | Admitting: Internal Medicine

## 2014-03-11 ENCOUNTER — Encounter (HOSPITAL_COMMUNITY): Payer: Self-pay | Admitting: Internal Medicine

## 2014-03-19 ENCOUNTER — Encounter: Payer: Self-pay | Admitting: Internal Medicine

## 2014-04-14 ENCOUNTER — Ambulatory Visit (INDEPENDENT_AMBULATORY_CARE_PROVIDER_SITE_OTHER): Payer: Medicare Other | Admitting: *Deleted

## 2014-04-14 DIAGNOSIS — Z950 Presence of cardiac pacemaker: Secondary | ICD-10-CM

## 2014-04-14 DIAGNOSIS — I442 Atrioventricular block, complete: Secondary | ICD-10-CM

## 2014-04-14 LAB — MDC_IDC_ENUM_SESS_TYPE_REMOTE
Battery Remaining Longevity: 134 mo
Brady Statistic AP VP Percent: 12 %
Brady Statistic AP VS Percent: 7.3 %
Brady Statistic AS VP Percent: 44 %
Brady Statistic AS VS Percent: 36 %
Date Time Interrogation Session: 20160113070027
Implantable Pulse Generator Serial Number: 7519064
Lead Channel Impedance Value: 690 Ohm
Lead Channel Pacing Threshold Amplitude: 0.75 V
Lead Channel Pacing Threshold Amplitude: 0.75 V
Lead Channel Pacing Threshold Pulse Width: 0.4 ms
Lead Channel Sensing Intrinsic Amplitude: 8.2 mV
Lead Channel Setting Pacing Amplitude: 1 V
Lead Channel Setting Pacing Amplitude: 2 V
Lead Channel Setting Sensing Sensitivity: 2 mV
MDC IDC MSMT BATTERY REMAINING PERCENTAGE: 95.5 %
MDC IDC MSMT BATTERY VOLTAGE: 3.02 V
MDC IDC MSMT LEADCHNL RA IMPEDANCE VALUE: 440 Ohm
MDC IDC MSMT LEADCHNL RA SENSING INTR AMPL: 4.6 mV
MDC IDC MSMT LEADCHNL RV PACING THRESHOLD PULSEWIDTH: 0.4 ms
MDC IDC SET LEADCHNL RV PACING PULSEWIDTH: 0.4 ms
MDC IDC STAT BRADY RA PERCENT PACED: 17 %
MDC IDC STAT BRADY RV PERCENT PACED: 56 %

## 2014-04-14 NOTE — Progress Notes (Signed)
Pacemaker remote check. Device function reviewed. Impedance, sensing, auto capture threshold consistent with previous measurements. Histograms appropriate for patient and level of activity. All other diagnostic data reviewed and is appropriate and stable for patient. Real time/magnet EGM shows appropriate sensing and capture. 1 mode switch--- <1%, no EGM. No ventricular high rate episodes. Estimated longevity 10.7-11.61yrs. Merlin 07/14/14 & ROV w/ JA 10/16.

## 2014-04-16 ENCOUNTER — Encounter: Payer: Self-pay | Admitting: Cardiology

## 2014-04-21 ENCOUNTER — Encounter: Payer: Self-pay | Admitting: Internal Medicine

## 2014-04-30 ENCOUNTER — Telehealth: Payer: Self-pay | Admitting: *Deleted

## 2014-04-30 MED ORDER — CARVEDILOL 12.5 MG PO TABS: 12.5000 mg | ORAL_TABLET | Freq: Two times a day (BID) | ORAL | Status: DC

## 2014-04-30 NOTE — Telephone Encounter (Signed)
refill request from [facility] for carvedilol. Medication sent to pharmacy.

## 2014-05-27 ENCOUNTER — Encounter: Payer: Self-pay | Admitting: Internal Medicine

## 2014-07-14 ENCOUNTER — Ambulatory Visit (INDEPENDENT_AMBULATORY_CARE_PROVIDER_SITE_OTHER): Payer: Medicare Other | Admitting: *Deleted

## 2014-07-14 ENCOUNTER — Encounter: Payer: Self-pay | Admitting: Internal Medicine

## 2014-07-14 ENCOUNTER — Encounter: Payer: Medicare Other | Admitting: *Deleted

## 2014-07-14 DIAGNOSIS — I442 Atrioventricular block, complete: Secondary | ICD-10-CM

## 2014-07-15 ENCOUNTER — Telehealth: Payer: Self-pay | Admitting: Cardiology

## 2014-07-15 NOTE — Telephone Encounter (Signed)
Confirmed remote transmission w/ pt daughter.   

## 2014-07-16 ENCOUNTER — Encounter: Payer: Self-pay | Admitting: Cardiology

## 2014-08-27 NOTE — Progress Notes (Signed)
Remote pacemaker transmission.   

## 2014-09-02 LAB — CUP PACEART REMOTE DEVICE CHECK
Battery Remaining Longevity: 135 mo
Battery Remaining Percentage: 95.5 %
Battery Voltage: 3.02 V
Brady Statistic AP VP Percent: 7 %
Brady Statistic AP VS Percent: 18 %
Brady Statistic AS VP Percent: 23 %
Brady Statistic AS VS Percent: 48 %
Brady Statistic RA Percent Paced: 18 %
Brady Statistic RV Percent Paced: 30 %
Lead Channel Impedance Value: 440 Ohm
Lead Channel Impedance Value: 740 Ohm
Lead Channel Pacing Threshold Amplitude: 0.625 V
Lead Channel Pacing Threshold Amplitude: 0.75 V
Lead Channel Sensing Intrinsic Amplitude: 12 mV
Lead Channel Sensing Intrinsic Amplitude: 5 mV
Lead Channel Setting Pacing Amplitude: 0.875
Lead Channel Setting Pacing Pulse Width: 0.4 ms
MDC IDC MSMT LEADCHNL RA PACING THRESHOLD PULSEWIDTH: 0.4 ms
MDC IDC MSMT LEADCHNL RV PACING THRESHOLD PULSEWIDTH: 0.4 ms
MDC IDC PG SERIAL: 7519064
MDC IDC SESS DTM: 20160413060017
MDC IDC SET LEADCHNL RA PACING AMPLITUDE: 2 V
MDC IDC SET LEADCHNL RV SENSING SENSITIVITY: 2 mV
Pulse Gen Model: 2240

## 2014-09-06 IMAGING — CR DG RIBS W/ CHEST 3+V*R*
4 series · 4 of 4 positions shown · non-contrast
Comparison: 10/03/2012

CLINICAL DATA: Status post fall.

RIGHT RIBS AND CHEST - 3+ VIEW

[w chest pa]
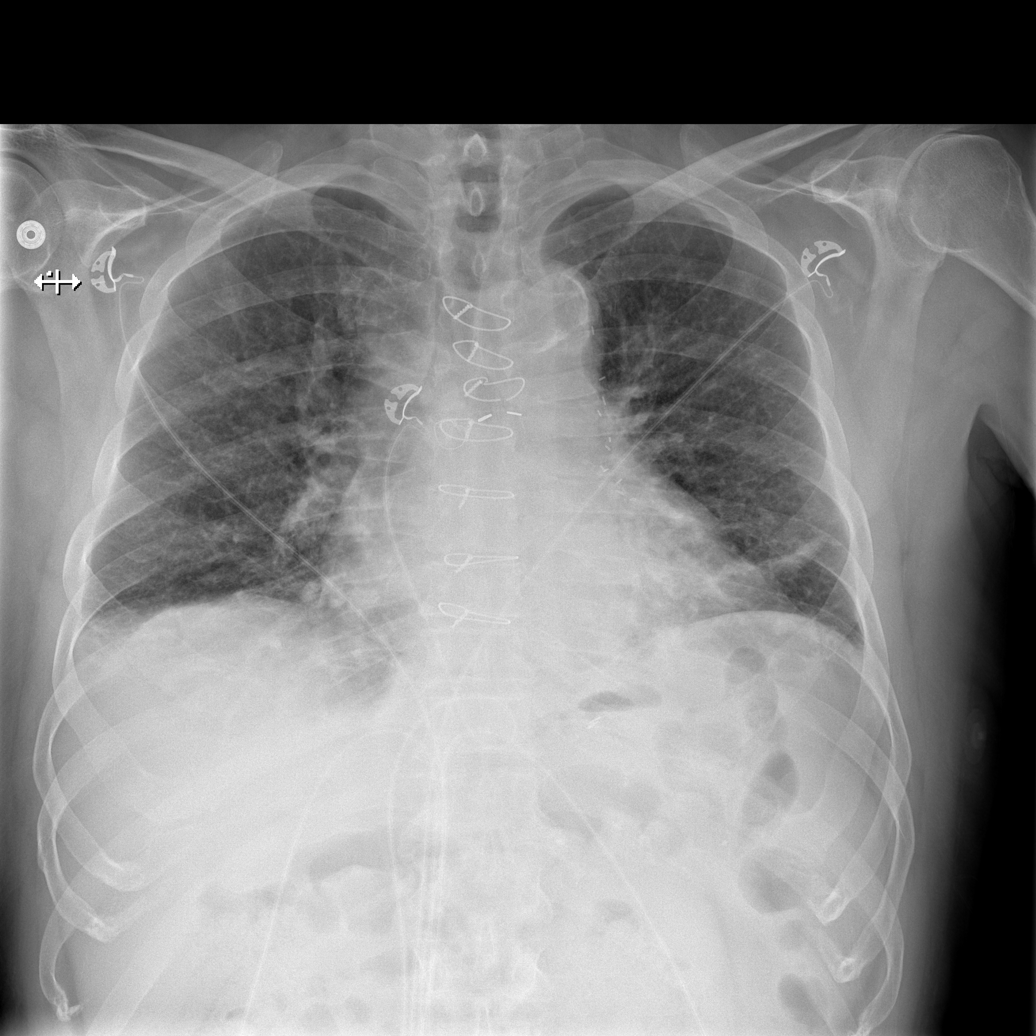

[w ribs ap upper right]
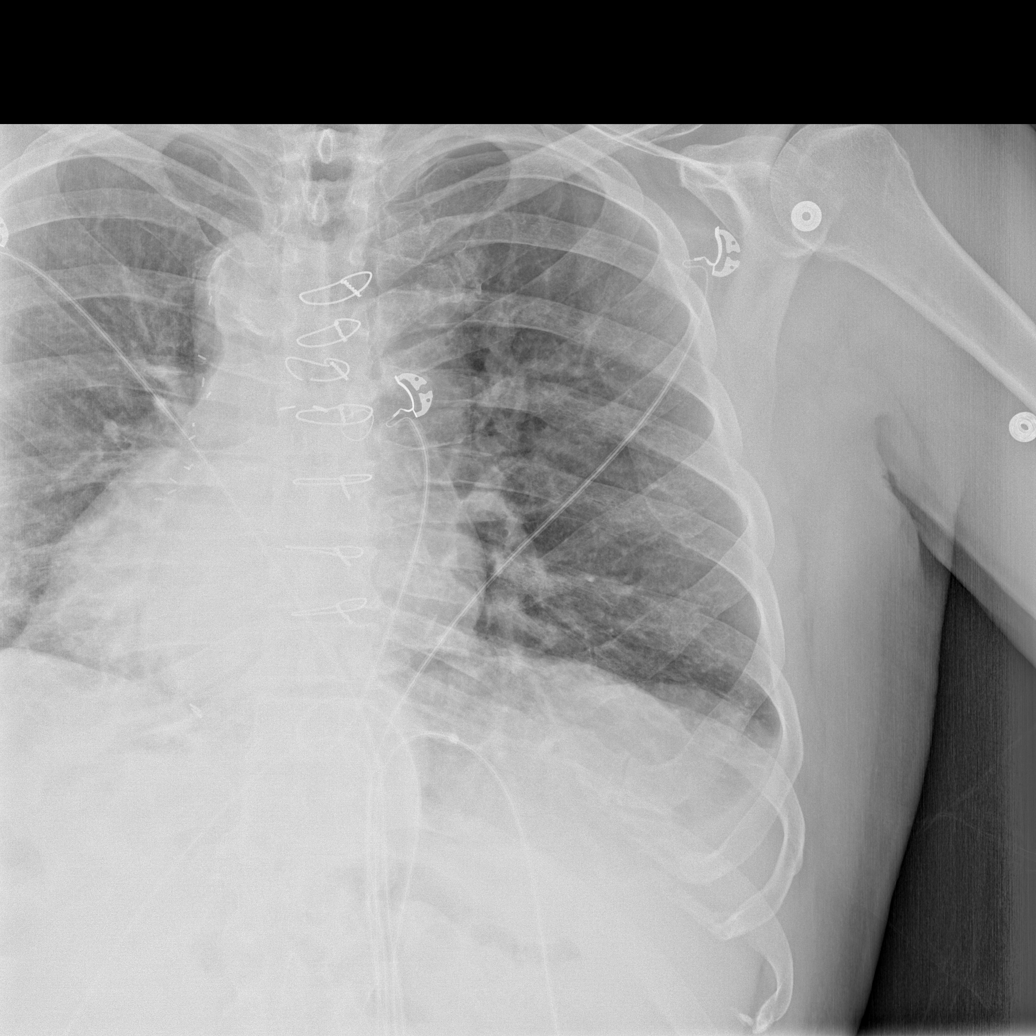

[w ribs ap lower right]
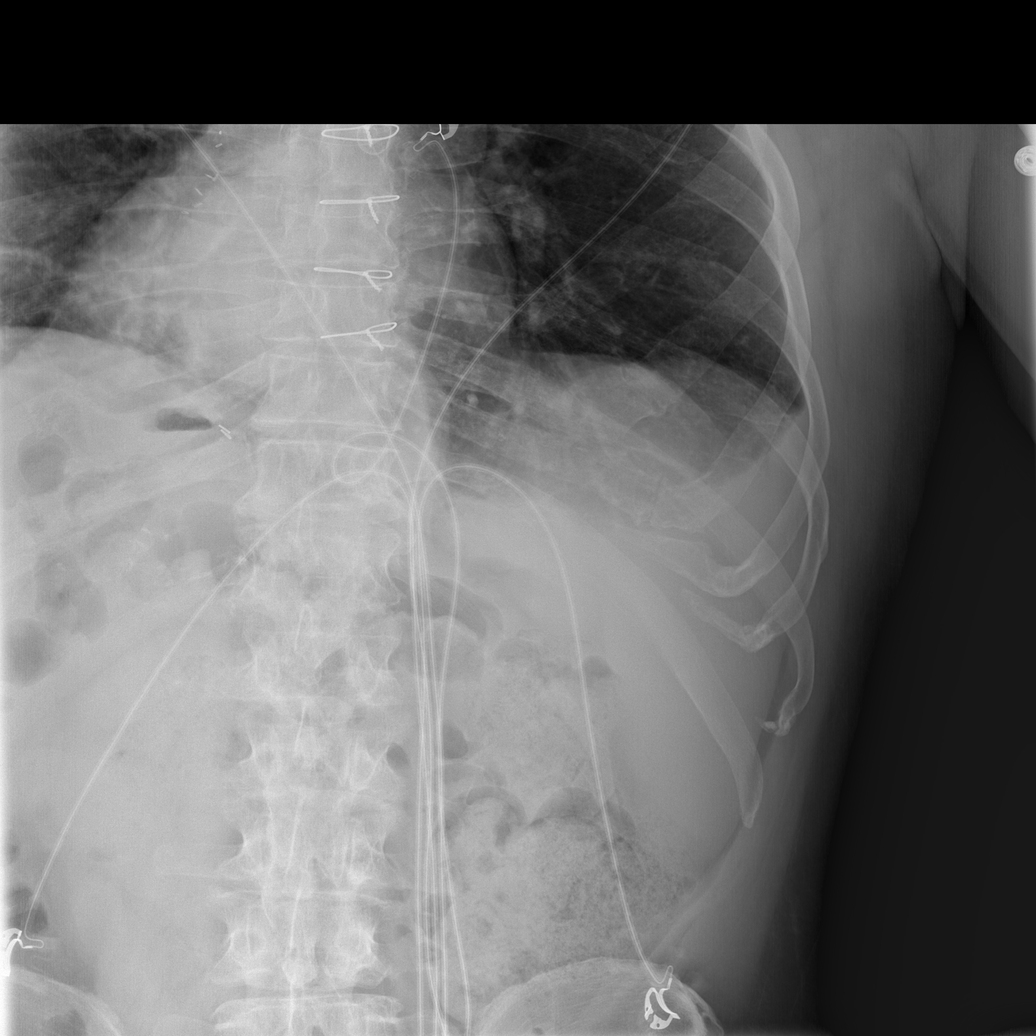

[w ribs obl right]
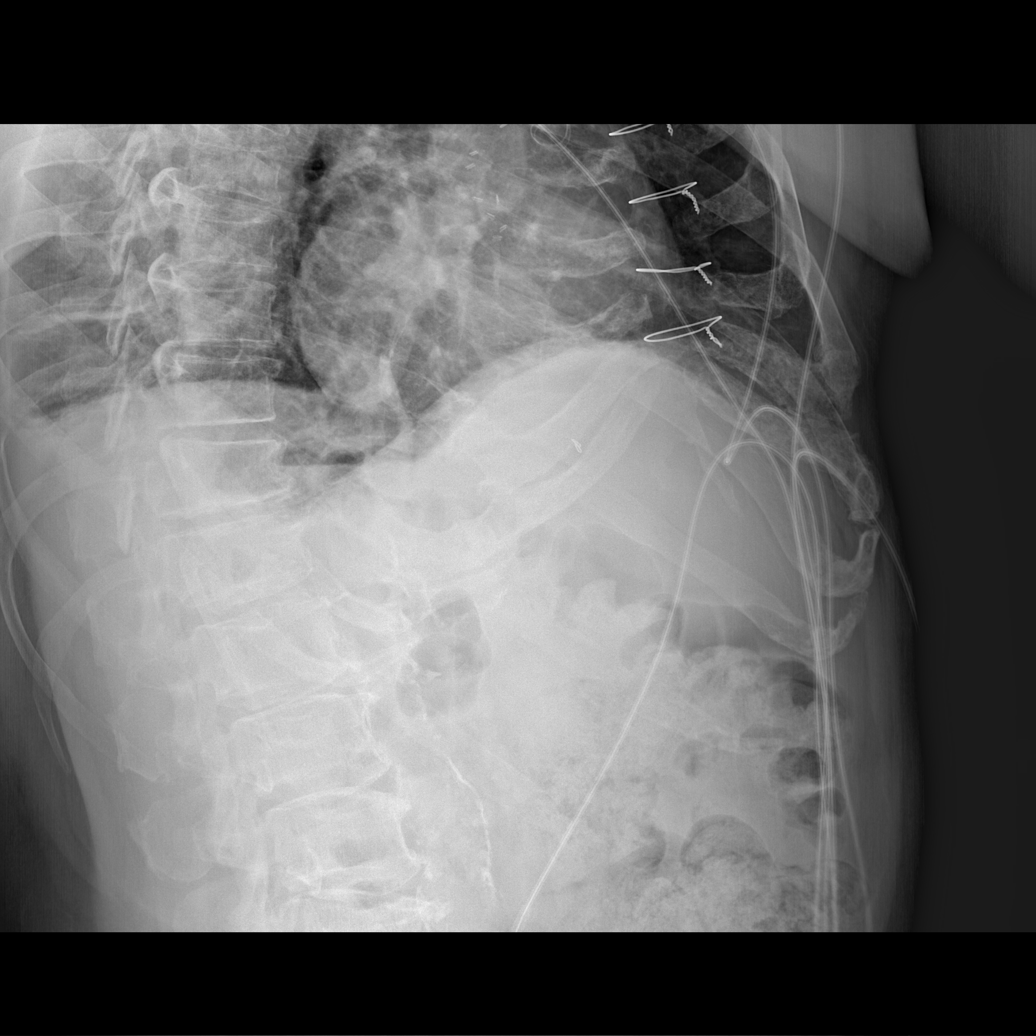

[4 of 4 positions shown; findings below may reference images not displayed]

FINDINGS: Previous median sternotomy and CABG procedure.  Heart
size is mildly enlarged.  Coarsened interstitial markings are noted
bilaterally.  There is atelectasis noted in the left base.  There
is an acute fracture involving the posterior aspect of the eighth
rib.
IMPRESSION: 1.  Right eighth rib fracture.
2.  Left base atelectasis.

## 2014-09-08 ENCOUNTER — Encounter: Payer: Self-pay | Admitting: Cardiology

## 2014-10-07 ENCOUNTER — Encounter: Payer: Self-pay | Admitting: Internal Medicine

## 2014-10-13 ENCOUNTER — Telehealth: Payer: Self-pay | Admitting: Cardiology

## 2014-10-13 ENCOUNTER — Encounter: Payer: Medicare Other | Admitting: *Deleted

## 2014-10-13 NOTE — Telephone Encounter (Signed)
LMOVM reminding pt to send remote transmission.   

## 2014-10-14 ENCOUNTER — Encounter: Payer: Self-pay | Admitting: Cardiology

## 2015-01-19 ENCOUNTER — Encounter: Payer: Medicare Other | Admitting: Internal Medicine

## 2015-01-21 DIAGNOSIS — M436 Torticollis: Secondary | ICD-10-CM | POA: Insufficient documentation

## 2015-02-16 ENCOUNTER — Encounter: Payer: Self-pay | Admitting: Internal Medicine

## 2015-02-16 ENCOUNTER — Ambulatory Visit (INDEPENDENT_AMBULATORY_CARE_PROVIDER_SITE_OTHER): Payer: Medicare Other | Admitting: Internal Medicine

## 2015-02-16 VITALS — BP 130/70 | HR 63 | Ht 68.0 in | Wt 189.2 lb

## 2015-02-16 DIAGNOSIS — I442 Atrioventricular block, complete: Secondary | ICD-10-CM | POA: Diagnosis not present

## 2015-02-16 DIAGNOSIS — I10 Essential (primary) hypertension: Secondary | ICD-10-CM | POA: Diagnosis not present

## 2015-02-16 LAB — CUP PACEART INCLINIC DEVICE CHECK
Brady Statistic RA Percent Paced: 17 %
Date Time Interrogation Session: 20161116102227
Implantable Lead Implant Date: 20140717
Implantable Lead Location: 753859
Implantable Lead Location: 753860
Implantable Lead Model: 1948
Lead Channel Impedance Value: 687.5 Ohm
Lead Channel Pacing Threshold Amplitude: 0.625 V
Lead Channel Pacing Threshold Pulse Width: 0.4 ms
Lead Channel Sensing Intrinsic Amplitude: 9 mV
Lead Channel Setting Pacing Amplitude: 2 V
Lead Channel Setting Pacing Pulse Width: 0.4 ms
MDC IDC LEAD IMPLANT DT: 20140717
MDC IDC MSMT BATTERY REMAINING LONGEVITY: 139.2
MDC IDC MSMT BATTERY VOLTAGE: 3.01 V
MDC IDC MSMT LEADCHNL RA IMPEDANCE VALUE: 437.5 Ohm
MDC IDC MSMT LEADCHNL RA PACING THRESHOLD AMPLITUDE: 0.75 V
MDC IDC MSMT LEADCHNL RA PACING THRESHOLD PULSEWIDTH: 0.4 ms
MDC IDC MSMT LEADCHNL RA SENSING INTR AMPL: 4.7 mV
MDC IDC SET LEADCHNL RV PACING AMPLITUDE: 0.875
MDC IDC SET LEADCHNL RV SENSING SENSITIVITY: 2 mV
MDC IDC STAT BRADY RV PERCENT PACED: 29 %
Pulse Gen Serial Number: 7519064

## 2015-02-16 NOTE — Patient Instructions (Signed)
Medication Instructions:  Your physician recommends that you continue on your current medications as directed. Please refer to the Current Medication list given to you today.   Labwork: None ordered   Testing/Procedures: None ordered   Follow-Up: Remote monitoring is used to monitor your Pacemaker from home. This monitoring reduces the number of office visits required to check your device to one time per year. It allows [place] to keep an eye on the functioning of your device to ensure it is working properly. You are scheduled for a device check from home on 05/18/15. You may send your transmission at any time that day. If you have a wireless device, the transmission will be sent automatically. After your physician reviews your transmission, you will receive a postcard with your next transmission date.   Your physician wants you to follow-up in: 12 months with Dr [name] in [place] will receive a reminder letter in the mail two months in advance. If you don't receive a letter, please call our office to schedule the follow-up appointment.   Any Other Special Instructions Will Be Listed Below (If Applicable).     If you need a refill on your cardiac medications before your next appointment, please call your pharmacy.

## 2015-02-16 NOTE — Progress Notes (Signed)
PCP: Sherrie Mustache, MD Primary Cardiologist:  Dr Bronson Ing  Ricardo Hunt is a 74 y.o. male who presents today for routine electrophysiology followup.  Since his last visit, the patient reports doing very well.  He is thinking about retirement from [employer] in January.  Today, he denies symptoms of palpitations, chest pain, shortness of breath,  lower extremity edema, presyncope, or syncope.  His primary concern is with URI symptoms/ allergies which are chronic.  The patient is otherwise without complaint today.   Past Medical History  Diagnosis Date  . Hypertension   . Vertigo   . Hyperlipidemia   . Mobitz (type) II atrioventricular block     s/p STJ Accent pacemaker implanted by Dr [name] 10-16-2012  . Pacemaker 10/16/2012  . Personal history of colonic polyps-tubular adenomas 08/31/2013   Past Surgical History  Procedure Laterality Date  . Cardiac bypass  1996    3 vessels  . Lumbar spine surgery  2006  . Inguinal hernia repair Left 2010  . Pacemaker insertion  10-16-2012    STJ Accent dual chamber pacemaker implanted by Dr [name] for symptomatic Mobitz II heart block  . Cataract extraction w/ intraocular lens  implant, bilateral Bilateral 2010  . Nasal septum surgery  09/01/13    Dr. Adriana Reams  . Permanent pacemaker insertion N/A 10/16/2012    Procedure: PERMANENT PACEMAKER INSERTION;  Surgeon: Thompson Grayer, MD;  Location: [facility] CATH LAB;  Service: Cardiovascular;  Laterality: N/A;    Current Outpatient Prescriptions  Medication Sig Dispense Refill  . amLODipine (NORVASC) 10 MG tablet Take 1 tablet (10 mg total) by mouth at bedtime. 30 tablet 0  . aspirin EC 81 MG tablet Take 81 mg by mouth daily.    Marland Kitchen atorvastatin (LIPITOR) 80 MG tablet Take 80 mg by mouth daily.    . carvedilol (COREG) 12.5 MG tablet Take 1 tablet (12.5 mg total) by mouth 2 (two) times daily. 180 tablet 3  . hydrALAZINE (APRESOLINE) 25 MG tablet Take 1 tablet (25 mg total) by mouth 2 (two) times daily. 60  tablet 6  . Omega-3 Fatty Acids (FISH OIL) 1000 MG CAPS Take 1,000 mg by mouth 2 (two) times daily.      No current facility-administered medications for this visit.   ROS- all systems are reviewedd and negative except as above  Physical Exam: Filed Vitals:   02/16/15 0933  BP: 130/70  Pulse: 63  Height: 5\' 8"  (1.727 m)  Weight: 189 lb 3.2 oz (85.821 kg)  SpO2: 95%    GEN- The patient is well appearing, alert and oriented x 3 today.   Head- normocephalic, atraumatic Eyes-  Sclera clear, conjunctiva pink Ears- hearing intact Oropharynx- clear Lungs- Clear to ausculation bilaterally, normal work of breathing Chest- pacemaker pocket is well healed Heart- Regular rate and rhythm, no murmurs, rubs or gallops, PMI not laterally displaced GI- soft, NT, ND, + BS Extremities- no clubbing, cyanosis, or edema  Pacemaker interrogation- reviewed in detail today,  See PACEART report  Assessment and Plan:  1.  Complete AV block Normal pacemaker function See Pace Art report No changes today  2. Chronic systolic dysfunction euvolemic today Followed by Dr Bronson Ing May benefit from repeat echo when he sees Dr Bronson Ing again  3. HTN Stable No change required today  4. CAD Stable No change required today  [facility] Return in 12 months to see me in [place] device clinic  Thompson Grayer MD, [facility] 02/16/2015 9:58 AM

## 2015-03-08 ENCOUNTER — Encounter: Payer: Self-pay | Admitting: Internal Medicine

## 2015-03-17 ENCOUNTER — Ambulatory Visit: Payer: Medicare Other | Admitting: Cardiovascular Disease

## 2015-04-19 ENCOUNTER — Ambulatory Visit: Payer: Medicare Other | Admitting: Cardiovascular Disease

## 2015-04-26 ENCOUNTER — Telehealth: Payer: Self-pay | Admitting: Cardiovascular Disease

## 2015-04-26 NOTE — Telephone Encounter (Signed)
Spoke with Ms. Clinton Gallant who says pt denies any CP/SOB/diziness, says that for the last week pt has had indigestion and some nausea, but not to the point that he couldn't work. Advised that if symptoms worsened, or new symptoms presented before Thursday to report to [facility] for evaluation. Ms. Clinton Gallant voices understanding.

## 2015-04-26 NOTE — Telephone Encounter (Signed)
Patient has been burping and feeling like he has indigestion with nausea  for the past week.  Daughter is concerned due to this is what happened last time he had heart troubles.  He has an appointment with Dr Bronson Ing this coming Thursday, she wants to know if he needs to wait until then or go to Hospital today.

## 2015-04-28 ENCOUNTER — Ambulatory Visit (INDEPENDENT_AMBULATORY_CARE_PROVIDER_SITE_OTHER): Payer: Medicare Other | Admitting: Cardiovascular Disease

## 2015-04-28 ENCOUNTER — Encounter: Payer: Self-pay | Admitting: Cardiovascular Disease

## 2015-04-28 ENCOUNTER — Encounter: Payer: Self-pay | Admitting: *Deleted

## 2015-04-28 VITALS — BP 122/64 | HR 61 | Ht 68.0 in | Wt 192.0 lb

## 2015-04-28 DIAGNOSIS — I10 Essential (primary) hypertension: Secondary | ICD-10-CM

## 2015-04-28 DIAGNOSIS — Z950 Presence of cardiac pacemaker: Secondary | ICD-10-CM

## 2015-04-28 DIAGNOSIS — I442 Atrioventricular block, complete: Secondary | ICD-10-CM | POA: Diagnosis not present

## 2015-04-28 DIAGNOSIS — I4729 Other ventricular tachycardia: Secondary | ICD-10-CM

## 2015-04-28 DIAGNOSIS — I255 Ischemic cardiomyopathy: Secondary | ICD-10-CM

## 2015-04-28 DIAGNOSIS — I25708 Atherosclerosis of coronary artery bypass graft(s), unspecified, with other forms of angina pectoris: Secondary | ICD-10-CM | POA: Diagnosis not present

## 2015-04-28 DIAGNOSIS — E785 Hyperlipidemia, unspecified: Secondary | ICD-10-CM

## 2015-04-28 DIAGNOSIS — I472 Ventricular tachycardia: Secondary | ICD-10-CM

## 2015-04-28 DIAGNOSIS — R110 Nausea: Secondary | ICD-10-CM

## 2015-04-28 NOTE — Addendum Note (Signed)
Addended by: Laurine Blazer on: 04/28/2015 10:39 AM   Modules accepted: Orders

## 2015-04-28 NOTE — Progress Notes (Signed)
Patient ID: Ricardo Hunt, male   DOB: 01/06/1941, 75 y.o.   MRN: [id number]      SUBJECTIVE: The patient is overdue for follow-up. He was last evaluated by me in October 2015.   He has a pacemaker and follows with Dr. [name]. LVEF 45-50% in 2014. He has coronary artery disease and prior history of 3-vessel CABG in 1996.  Device interrogation on 02/16/2015 demonstrated normal device function with PACs and atrial tachycardia and one high ventricular rate noted with 26 beats of nonsustained ventricular tachycardia.  He had been experiencing 8 days of nausea accompanied by belching. He had similar symptoms prior to CABG. He has not had an ischemic evaluation since bypass surgery as per the patient and his daughter. He denies chest tightness, shortness of breath, and leg swelling. He continues to work 12 hours daily at [employer]  Review of Systems: As per "subjective", otherwise negative.  No Known Allergies  Current Outpatient Prescriptions  Medication Sig Dispense Refill  . amLODipine (NORVASC) 10 MG tablet Take 1 tablet (10 mg total) by mouth at bedtime. 30 tablet 0  . aspirin EC 81 MG tablet Take 81 mg by mouth daily.    Marland Kitchen atorvastatin (LIPITOR) 80 MG tablet Take 80 mg by mouth daily.    . carvedilol (COREG) 12.5 MG tablet Take 1 tablet (12.5 mg total) by mouth 2 (two) times daily. 180 tablet 3  . hydrALAZINE (APRESOLINE) 25 MG tablet Take 1 tablet (25 mg total) by mouth 2 (two) times daily. 60 tablet 6  . Omega-3 Fatty Acids (FISH OIL) 1000 MG CAPS Take 1,000 mg by mouth 2 (two) times daily.      No current facility-administered medications for this visit.    Past Medical History  Diagnosis Date  . Hypertension   . Vertigo   . Hyperlipidemia   . Mobitz (type) II atrioventricular block     s/p STJ Accent pacemaker implanted by Dr [name] 10-16-2012  . Pacemaker 10/16/2012  . Personal history of colonic polyps-tubular adenomas 08/31/2013    Past Surgical History  Procedure Laterality  Date  . Cardiac bypass  1996    3 vessels  . Lumbar spine surgery  2006  . Inguinal hernia repair Left 2010  . Pacemaker insertion  10-16-2012    STJ Accent dual chamber pacemaker implanted by Dr [name] for symptomatic Mobitz II heart block  . Cataract extraction w/ intraocular lens  implant, bilateral Bilateral 2010  . Nasal septum surgery  09/01/13    Dr. Adriana Reams  . Permanent pacemaker insertion N/A 10/16/2012    Procedure: PERMANENT PACEMAKER INSERTION;  Surgeon: Thompson Grayer, MD;  Location[facility] CATH LAB;  Service: Cardiovascular;  Laterality: N/A;    Social History   Social History  . Marital Status: Widowed    Spouse Name: N/A  . Number of Children: N/A  . Years of Education: N/A   Occupational History  . Not on file.   Social History Main Topics  . Smoking status: Former Smoker -- 2.00 packs/day for 40 years    Types: Cigarettes    Quit date: 04/02/1994  . Smokeless tobacco: Never Used  . Alcohol Use: Yes     Comment: rare 01/01/14 occasionally- A  . Drug Use: No  . Sexual Activity: Yes   Other Topics Concern  . Not on file   Social History Narrative     Filed Vitals:   04/28/15 0945  BP: 122/64  Pulse: 61  Height: 5\' 8"  (1.727 m)  Weight: 192 lb (87.091 kg)  SpO2: 98%    PHYSICAL EXAM General: NAD HEENT: Normal. Neck: No JVD, no thyromegaly. Lungs: Clear to auscultation bilaterally with normal respiratory effort. CV: Nondisplaced PMI.  Regular rate and rhythm, normal S1/S2, no S3/S4, no murmur. No pretibial or periankle edema.  No carotid bruit.   Abdomen: Soft, nontender, no distention.  Neurologic: Alert and oriented x 3.  Psych: Normal affect. Skin: Normal. Musculoskeletal: No gross deformities. Extremities: No clubbing or cyanosis.   ECG: Most recent ECG reviewed.      ASSESSMENT AND PLAN: 1. Complete heart block s/p dual chamber PPM: Device interrogation on 02/16/2015 demonstrated normal device function with PACs and atrial  tachycardia and one high ventricular rate noted with 26 beats of nonsustained ventricular tachycardia. Follows with Dr. [name].  2. Nausea and nonsustained VT in context of CAD s/p 3-v CABG: Prior symptoms may have represented anginal equivalent albeit cannot be certain. EF 45-50% in 2014. No signs of volume overload. Continue ASA, statin, and Coreg. Has had NSVT as well. As CABG was in 1996 and he has not had any ischemic evaluation, will obtain Lexiscan Cardiolite stress test and echocardiogram.  3. Essential HTN: Controlled. No changes. Continue Coreg, amlodipine, and hydralazine.  4. Hypercholesterolemia: Will obtain copy of lipids from PCP and continue Lipitor 80 mg.   Dispo: f/u 6 months.  Kate Sable, M.D., F.A.C.C.

## 2015-04-28 NOTE — Patient Instructions (Signed)
Your physician has requested that you have an echocardiogram. Echocardiography is a painless test that uses sound waves to create images of your heart. It provides your doctor with information about the size and shape of your heart and how well your heart's chambers and valves are working. This procedure takes approximately one hour. There are no restrictions for this procedure. Your physician has requested that you have a lexiscan myoview. For further information please visit [email or web address]. Please follow instruction sheet, as given. Office will contact with results via phone or letter.   Continue all current medications. Your physician wants you to follow up in: 6 months.  You will receive a reminder letter in the mail one-two months in advance.  If you don't receive a letter, please call our office to schedule the follow up appointment    

## 2015-05-03 ENCOUNTER — Ambulatory Visit (AMBULATORY_SURGERY_CENTER): Payer: Self-pay

## 2015-05-03 VITALS — Ht 68.0 in | Wt 194.4 lb

## 2015-05-03 DIAGNOSIS — Z860100 Personal history of colon polyps, unspecified: Secondary | ICD-10-CM

## 2015-05-03 NOTE — Progress Notes (Signed)
No allergies to eggs or soy No diet/weight loss meds No home oxygen No past problems with anesthesia  No internet 

## 2015-05-04 ENCOUNTER — Other Ambulatory Visit: Payer: Self-pay | Admitting: Cardiovascular Disease

## 2015-05-12 ENCOUNTER — Ambulatory Visit (INDEPENDENT_AMBULATORY_CARE_PROVIDER_SITE_OTHER): Payer: Medicare Other

## 2015-05-12 ENCOUNTER — Other Ambulatory Visit: Payer: Self-pay

## 2015-05-12 DIAGNOSIS — I25708 Atherosclerosis of coronary artery bypass graft(s), unspecified, with other forms of angina pectoris: Secondary | ICD-10-CM | POA: Diagnosis not present

## 2015-05-12 DIAGNOSIS — I472 Ventricular tachycardia: Secondary | ICD-10-CM | POA: Diagnosis not present

## 2015-05-12 DIAGNOSIS — I4729 Other ventricular tachycardia: Secondary | ICD-10-CM

## 2015-05-13 ENCOUNTER — Telehealth: Payer: Self-pay | Admitting: *Deleted

## 2015-05-13 NOTE — Telephone Encounter (Signed)
-----   Message from Herminio Commons, MD sent at 05/12/2015  1:59 PM EST ----- Heart function is mildly reduced but stable since last echo.

## 2015-05-13 NOTE — Telephone Encounter (Signed)
Notes Recorded by Laurine Blazer, LPN on [id number] at 075-GRM AM Daughter Lattie Haw Bullins) notified. Copy to pmd.

## 2015-05-17 ENCOUNTER — Telehealth: Payer: Self-pay

## 2015-05-17 ENCOUNTER — Encounter: Payer: Self-pay | Admitting: Internal Medicine

## 2015-05-17 ENCOUNTER — Ambulatory Visit (AMBULATORY_SURGERY_CENTER): Payer: Medicare Other | Admitting: Internal Medicine

## 2015-05-17 VITALS — BP 156/85 | HR 60 | Temp 96.4°F | Resp 16 | Ht 68.0 in | Wt 194.0 lb

## 2015-05-17 DIAGNOSIS — K635 Polyp of colon: Secondary | ICD-10-CM | POA: Diagnosis not present

## 2015-05-17 DIAGNOSIS — D125 Benign neoplasm of sigmoid colon: Secondary | ICD-10-CM

## 2015-05-17 DIAGNOSIS — Z86010 Personal history of colonic polyps: Secondary | ICD-10-CM | POA: Diagnosis not present

## 2015-05-17 DIAGNOSIS — D122 Benign neoplasm of ascending colon: Secondary | ICD-10-CM

## 2015-05-17 DIAGNOSIS — D123 Benign neoplasm of transverse colon: Secondary | ICD-10-CM | POA: Diagnosis not present

## 2015-05-17 MED ORDER — SODIUM CHLORIDE 0.9 % IV SOLN: 500.0000 mL | INTRAVENOUS | Status: DC

## 2015-05-17 NOTE — Patient Instructions (Signed)

## 2015-05-17 NOTE — Progress Notes (Signed)
Patient awakening,vss,report to rn 

## 2015-05-17 NOTE — Op Note (Signed)
[facility]  [address] [place], [address]   COLONOSCOPY PROCEDURE REPORT  PATIENT: Ricardo Hunt, Ricardo Hunt  MR#: [id number] BIRTHDATE: 1940-10-28 , 89  yrs. old GENDER: male ENDOSCOPIST: Gatha Mayer, MD, [facility] PROCEDURE DATE:  05/17/2015 PROCEDURE:   Colonoscopy, surveillance , Colonoscopy with biopsy, and Colonoscopy with snare polypectomy First Screening Colonoscopy - Avg.  risk and is 50 yrs.  old or older - No.  Prior Negative Screening - Now for repeat screening. N/A  History of Adenoma - Now for follow-up colonoscopy & has been > or = to 3 yrs.  No.  It has been less than 3 yrs since last colonoscopy.  Medical reason.  Polyps removed today? Yes ASA CLASS:   Class III INDICATIONS:Surveillance due to prior colonic neoplasia and PH Colon Adenoma. MEDICATIONS: Propofol 200 mg IV and Monitored anesthesia care  DESCRIPTION OF PROCEDURE:   After the risks benefits and alternatives of the procedure were thoroughly explained, informed consent was obtained.  The digital rectal exam revealed no abnormalities of the rectum, revealed no prostatic nodules, and revealed the prostate was not enlarged.   The LB CF-H180AL Loaner [id number]  endoscope was introduced through the anus and advanced to the cecum, which was identified by both the appendix and ileocecal valve. No adverse events experienced.   The quality of the prep was good.  (MiraLax was used)  The instrument was then slowly withdrawn as the colon was fully examined. Estimated blood loss is zero unless otherwise noted in this procedure report.  COLON FINDINGS: Three sessile polyps ranging from 2 to 29mm in size were found in the transverse colon and ascending colon. Polypectomies were performed with cold forceps.  The resection was complete, the polyp tissue was completely retrieved and sent to histology.   A polypoid shaped sessile polyp measuring 5 mm in size was found in the sigmoid colon.  A polypectomy was  performed with a cold snare.  The resection was complete, the polyp tissue was completely retrieved and sent to histology.   There was diverticulosis noted in the sigmoid colon.   The examination was otherwise normal.  Retroflexed views revealed no abnormalities. The time to cecum = 2.8 Withdrawal time = 15.7   The scope was withdrawn and the procedure completed. COMPLICATIONS: There were no immediate complications.  ENDOSCOPIC IMPRESSION: 1.   Three sessile polyps ranging from 2 to 13mm in size were found in the transverse colon and ascending colon; polypectomies were performed with cold forceps 2.   Sessile polyp was found in the sigmoid colon; polypectomy was performed with a cold snare 3.   Diverticulosis was noted in the sigmoid colon 4.   The examination was otherwise normal  RECOMMENDATIONS: Repeat Colonoscopy in 3 years likely - will notify - he had 10 adenomas 2015  eSigned:  Gatha Mayer, MD, [facility] 05/17/2015 9:28 AM cc: The Patient and Dr. Barnet Glasgow

## 2015-05-17 NOTE — Progress Notes (Signed)
Called to room to assist during endoscopic procedure.  Patient ID and intended procedure confirmed with present staff. Received instructions for my participation in the procedure from the performing physician.  

## 2015-05-17 NOTE — Telephone Encounter (Signed)
Faxed reports listed below to Dr Edrick Oh at  [facility] # [phone].

## 2015-05-17 NOTE — Telephone Encounter (Signed)
-----   Message from Gatha Mayer, MD sent at 05/17/2015 11:20 AM EST ----- Regarding: send records Please print and fax copies of 2015 colonoscopy and pathology reports to Dr. Dione Housekeeper

## 2015-05-18 ENCOUNTER — Telehealth: Payer: Self-pay

## 2015-05-18 ENCOUNTER — Ambulatory Visit (INDEPENDENT_AMBULATORY_CARE_PROVIDER_SITE_OTHER): Payer: Medicare Other | Admitting: *Deleted

## 2015-05-18 DIAGNOSIS — Z950 Presence of cardiac pacemaker: Secondary | ICD-10-CM

## 2015-05-18 DIAGNOSIS — I442 Atrioventricular block, complete: Secondary | ICD-10-CM

## 2015-05-18 NOTE — Progress Notes (Signed)
Remote pacemaker transmission.   

## 2015-05-18 NOTE — Telephone Encounter (Signed)
  Follow up Call-  Call back number 05/17/2015 08/25/2013  Post procedure Call Back phone  # [phone] daughter number ok to talk with her  Permission to leave phone message Yes Yes    Patient was called for follow up after his procedure on 05/17/2015. No answer at the number given for follow up phone call. A message was left on the answering machine.

## 2015-05-20 ENCOUNTER — Encounter (HOSPITAL_COMMUNITY): Payer: Medicare Other

## 2015-05-20 ENCOUNTER — Ambulatory Visit (HOSPITAL_COMMUNITY): Payer: Medicare Other

## 2015-05-20 ENCOUNTER — Inpatient Hospital Stay (HOSPITAL_COMMUNITY): Admission: RE | Admit: 2015-05-20 | Payer: Medicare Other | Source: Ambulatory Visit

## 2015-05-25 ENCOUNTER — Encounter: Payer: Self-pay | Admitting: Internal Medicine

## 2015-05-25 DIAGNOSIS — Z86010 Personal history of colonic polyps: Secondary | ICD-10-CM

## 2015-05-25 NOTE — Progress Notes (Signed)
Quick Note:  1-2 diminutive adenomas Recall colon 2020 - 10 adenomas 2015 ______

## 2015-05-26 ENCOUNTER — Ambulatory Visit (HOSPITAL_COMMUNITY): Payer: Medicare Other

## 2015-05-26 ENCOUNTER — Encounter (HOSPITAL_COMMUNITY): Payer: Medicare Other

## 2015-06-03 ENCOUNTER — Inpatient Hospital Stay (HOSPITAL_COMMUNITY): Admission: RE | Admit: 2015-06-03 | Payer: Medicare Other | Source: Ambulatory Visit

## 2015-06-03 ENCOUNTER — Ambulatory Visit (HOSPITAL_COMMUNITY): Payer: Medicare Other

## 2015-06-03 ENCOUNTER — Encounter (HOSPITAL_COMMUNITY): Payer: Medicare Other

## 2015-06-13 ENCOUNTER — Encounter (HOSPITAL_COMMUNITY)
Admission: RE | Admit: 2015-06-13 | Discharge: 2015-06-13 | Disposition: A | Discharge status: Home / Self Care | Payer: Medicare Other | Source: Ambulatory Visit | Attending: Cardiovascular Disease | Admitting: Cardiovascular Disease

## 2015-06-13 ENCOUNTER — Inpatient Hospital Stay (HOSPITAL_COMMUNITY): Admission: RE | Admit: 2015-06-13 | Payer: Medicare Other | Source: Ambulatory Visit

## 2015-06-13 ENCOUNTER — Encounter (HOSPITAL_COMMUNITY): Payer: Self-pay

## 2015-06-13 DIAGNOSIS — I4729 Other ventricular tachycardia: Secondary | ICD-10-CM

## 2015-06-13 DIAGNOSIS — I472 Ventricular tachycardia: Secondary | ICD-10-CM | POA: Diagnosis not present

## 2015-06-13 DIAGNOSIS — I25708 Atherosclerosis of coronary artery bypass graft(s), unspecified, with other forms of angina pectoris: Secondary | ICD-10-CM | POA: Diagnosis not present

## 2015-06-13 DIAGNOSIS — Z951 Presence of aortocoronary bypass graft: Secondary | ICD-10-CM | POA: Insufficient documentation

## 2015-06-13 LAB — NM MYOCAR MULTI W/SPECT W/WALL MOTION / EF
CHL CUP NUCLEAR SDS: 1
CHL CUP NUCLEAR SSS: 7
CSEPPHR: 76 {beats}/min
LHR: 0.36
LV sys vol: 112 mL
LVDIAVOL: 198 mL (ref 62–150)
Rest HR: 63 {beats}/min
SRS: 6
TID: 1.09

## 2015-06-13 MED ORDER — TECHNETIUM TC 99M SESTAMIBI GENERIC - CARDIOLITE
30.0000 | Freq: Once | INTRAVENOUS | Status: AC | PRN
  Administered 2015-06-13: 29.2 via INTRAVENOUS

## 2015-06-13 MED ORDER — TECHNETIUM TC 99M SESTAMIBI - CARDIOLITE
10.0000 | Freq: Once | INTRAVENOUS | Status: AC | PRN
  Administered 2015-06-13: 9.5 via INTRAVENOUS

## 2015-06-13 MED ORDER — SODIUM CHLORIDE 0.9% FLUSH
INTRAVENOUS | Status: AC
  Administered 2015-06-13: 10 mL via INTRAVENOUS
  Filled 2015-06-13: qty 10

## 2015-06-13 MED ORDER — REGADENOSON 0.4 MG/5ML IV SOLN
INTRAVENOUS | Status: AC
  Administered 2015-06-13: 0.4 mg via INTRAVENOUS
  Filled 2015-06-13: qty 5

## 2015-06-14 ENCOUNTER — Telehealth: Payer: Self-pay | Admitting: *Deleted

## 2015-06-14 LAB — CUP PACEART REMOTE DEVICE CHECK
Battery Remaining Longevity: 132 mo
Battery Remaining Percentage: 95.5 %
Brady Statistic AS VS Percent: 1 %
Date Time Interrogation Session: 20170215071140
Implantable Lead Implant Date: 20140717
Implantable Lead Location: 753860
Lead Channel Pacing Threshold Amplitude: 0.75 V
Lead Channel Pacing Threshold Pulse Width: 0.4 ms
Lead Channel Sensing Intrinsic Amplitude: 12 mV
Lead Channel Sensing Intrinsic Amplitude: 4.9 mV
Lead Channel Setting Pacing Amplitude: 1 V
Lead Channel Setting Pacing Amplitude: 2 V
Lead Channel Setting Pacing Pulse Width: 0.4 ms
Lead Channel Setting Sensing Sensitivity: 2 mV
MDC IDC LEAD IMPLANT DT: 20140717
MDC IDC LEAD LOCATION: 753859
MDC IDC LEAD MODEL: 1948
MDC IDC MSMT BATTERY VOLTAGE: 3.01 V
MDC IDC MSMT LEADCHNL RA IMPEDANCE VALUE: 440 Ohm
MDC IDC MSMT LEADCHNL RV IMPEDANCE VALUE: 680 Ohm
MDC IDC STAT BRADY AP VP PERCENT: 20 %
MDC IDC STAT BRADY AP VS PERCENT: 1 %
MDC IDC STAT BRADY AS VP PERCENT: 78 %
MDC IDC STAT BRADY RA PERCENT PACED: 17 %
MDC IDC STAT BRADY RV PERCENT PACED: 98 %
Pulse Gen Model: 2240
Pulse Gen Serial Number: 7519064

## 2015-06-14 NOTE — Telephone Encounter (Signed)
-----   Message from [place] sent at 06/14/2015  1:22 PM EDT -----   ----- Message -----    From: Arnoldo Lenis, MD    Sent: 06/14/2015   1:09 PM      To: Staci T Ashworth, CMA  Stress test shows no new blockages, only evidence of old blockages. Dr Raliegh Ip will discuss further at f/u, nothing new to do at this time  Zandra Abts MD

## 2015-06-14 NOTE — Telephone Encounter (Signed)
Notes Recorded by Laurine Blazer, LPN on 075-GRM at [id number] PM Daughter Lattie Haw Bullins) notified via voice mail. Copy to pmd.

## 2015-06-15 ENCOUNTER — Encounter: Payer: Self-pay | Admitting: Cardiology

## 2015-08-17 ENCOUNTER — Ambulatory Visit (INDEPENDENT_AMBULATORY_CARE_PROVIDER_SITE_OTHER): Payer: Medicare Other | Admitting: *Deleted

## 2015-08-17 DIAGNOSIS — I442 Atrioventricular block, complete: Secondary | ICD-10-CM | POA: Diagnosis not present

## 2015-08-17 NOTE — Progress Notes (Signed)
Remote pacemaker transmission.   

## 2015-09-07 ENCOUNTER — Encounter: Payer: Self-pay | Admitting: Cardiology

## 2015-09-08 LAB — CUP PACEART REMOTE DEVICE CHECK
Battery Remaining Longevity: 131 mo
Battery Voltage: 3.01 V
Brady Statistic RA Percent Paced: 18 %
Date Time Interrogation Session: 20170517060014
Implantable Lead Implant Date: 20140717
Implantable Lead Model: 1948
Lead Channel Impedance Value: 430 Ohm
Lead Channel Sensing Intrinsic Amplitude: 4.1 mV
Lead Channel Setting Pacing Amplitude: 1 V
Lead Channel Setting Pacing Pulse Width: 0.4 ms
Lead Channel Setting Sensing Sensitivity: 2 mV
MDC IDC LEAD IMPLANT DT: 20140717
MDC IDC LEAD LOCATION: 753859
MDC IDC LEAD LOCATION: 753860
MDC IDC MSMT BATTERY REMAINING PERCENTAGE: 95.5 %
MDC IDC MSMT LEADCHNL RV IMPEDANCE VALUE: 660 Ohm
MDC IDC MSMT LEADCHNL RV PACING THRESHOLD AMPLITUDE: 0.75 V
MDC IDC MSMT LEADCHNL RV PACING THRESHOLD PULSEWIDTH: 0.4 ms
MDC IDC MSMT LEADCHNL RV SENSING INTR AMPL: 12 mV
MDC IDC PG SERIAL: 7519064
MDC IDC SET LEADCHNL RA PACING AMPLITUDE: 2 V
MDC IDC STAT BRADY AP VP PERCENT: 21 %
MDC IDC STAT BRADY AP VS PERCENT: 1 %
MDC IDC STAT BRADY AS VP PERCENT: 76 %
MDC IDC STAT BRADY AS VS PERCENT: 1 %
MDC IDC STAT BRADY RV PERCENT PACED: 97 %
Pulse Gen Model: 2240

## 2015-11-09 ENCOUNTER — Ambulatory Visit: Payer: Medicare Other | Admitting: Cardiovascular Disease

## 2015-11-16 ENCOUNTER — Ambulatory Visit (INDEPENDENT_AMBULATORY_CARE_PROVIDER_SITE_OTHER): Payer: Medicare Other | Admitting: *Deleted

## 2015-11-16 DIAGNOSIS — I442 Atrioventricular block, complete: Secondary | ICD-10-CM | POA: Diagnosis not present

## 2015-11-16 DIAGNOSIS — Z950 Presence of cardiac pacemaker: Secondary | ICD-10-CM

## 2015-11-16 NOTE — Progress Notes (Signed)
Remote pacemaker transmission.   

## 2015-11-17 ENCOUNTER — Encounter: Payer: Self-pay | Admitting: Cardiology

## 2015-11-18 LAB — CUP PACEART REMOTE DEVICE CHECK
Battery Remaining Percentage: 95.5 %
Brady Statistic AP VP Percent: 22 %
Brady Statistic AP VS Percent: 1 %
Brady Statistic RA Percent Paced: 19 %
Brady Statistic RV Percent Paced: 96 %
Date Time Interrogation Session: 20170816060015
Implantable Lead Implant Date: 20140717
Implantable Lead Location: 753859
Implantable Lead Location: 753860
Implantable Lead Model: 1948
Lead Channel Impedance Value: 640 Ohm
Lead Channel Pacing Threshold Amplitude: 0.5 V
Lead Channel Pacing Threshold Pulse Width: 0.4 ms
Lead Channel Setting Pacing Amplitude: 0.75 V
Lead Channel Setting Pacing Amplitude: 2 V
Lead Channel Setting Pacing Pulse Width: 0.4 ms
MDC IDC LEAD IMPLANT DT: 20140717
MDC IDC MSMT BATTERY REMAINING LONGEVITY: 131 mo
MDC IDC MSMT BATTERY VOLTAGE: 3.01 V
MDC IDC MSMT LEADCHNL RA IMPEDANCE VALUE: 410 Ohm
MDC IDC MSMT LEADCHNL RA PACING THRESHOLD AMPLITUDE: 0.75 V
MDC IDC MSMT LEADCHNL RA SENSING INTR AMPL: 3.8 mV
MDC IDC MSMT LEADCHNL RV PACING THRESHOLD PULSEWIDTH: 0.4 ms
MDC IDC MSMT LEADCHNL RV SENSING INTR AMPL: 12 mV
MDC IDC SET LEADCHNL RV SENSING SENSITIVITY: 2 mV
MDC IDC STAT BRADY AS VP PERCENT: 74 %
MDC IDC STAT BRADY AS VS PERCENT: 1.1 %
Pulse Gen Model: 2240
Pulse Gen Serial Number: 7519064

## 2015-12-15 ENCOUNTER — Encounter: Payer: Self-pay | Admitting: *Deleted

## 2015-12-16 ENCOUNTER — Encounter: Payer: Self-pay | Admitting: Cardiovascular Disease

## 2015-12-16 ENCOUNTER — Ambulatory Visit (INDEPENDENT_AMBULATORY_CARE_PROVIDER_SITE_OTHER): Payer: Medicare Other | Admitting: Cardiovascular Disease

## 2015-12-16 VITALS — BP 122/70 | HR 60 | Ht 68.0 in | Wt 191.0 lb

## 2015-12-16 DIAGNOSIS — Z950 Presence of cardiac pacemaker: Secondary | ICD-10-CM | POA: Diagnosis not present

## 2015-12-16 DIAGNOSIS — I472 Ventricular tachycardia: Secondary | ICD-10-CM | POA: Diagnosis not present

## 2015-12-16 DIAGNOSIS — I10 Essential (primary) hypertension: Secondary | ICD-10-CM

## 2015-12-16 DIAGNOSIS — M79604 Pain in right leg: Secondary | ICD-10-CM

## 2015-12-16 DIAGNOSIS — I4729 Other ventricular tachycardia: Secondary | ICD-10-CM

## 2015-12-16 DIAGNOSIS — E785 Hyperlipidemia, unspecified: Secondary | ICD-10-CM

## 2015-12-16 DIAGNOSIS — M79605 Pain in left leg: Secondary | ICD-10-CM

## 2015-12-16 DIAGNOSIS — Z951 Presence of aortocoronary bypass graft: Secondary | ICD-10-CM

## 2015-12-16 DIAGNOSIS — I442 Atrioventricular block, complete: Secondary | ICD-10-CM

## 2015-12-16 DIAGNOSIS — I25708 Atherosclerosis of coronary artery bypass graft(s), unspecified, with other forms of angina pectoris: Secondary | ICD-10-CM

## 2015-12-16 NOTE — Progress Notes (Signed)
SUBJECTIVE: The patient returns for follow-up. He has a pacemaker and follows with Dr. [name]. LVEF 45-50% in 2014. He has coronary artery disease and prior history of 3-vessel CABG in 1996.  Nuclear stress test and echocardiogram performed earlier this year reviewed below.  Denies chest pain/SOB. Complains of bilateral leg pain. PCP switching Lipitor to Crestor to see if this improves symptoms.  Still works 12 hours daily at [employer]  Review of Systems: As per "subjective", otherwise negative.  No Known Allergies  Current Outpatient Prescriptions  Medication Sig Dispense Refill  . amLODipine (NORVASC) 10 MG tablet Take 1 tablet (10 mg total) by mouth at bedtime. 30 tablet 0  . aspirin EC 81 MG tablet Take 81 mg by mouth daily.    . carvedilol (COREG) 12.5 MG tablet Take 1 tablet by mouth two  times daily 180 tablet 3  . hydrALAZINE (APRESOLINE) 25 MG tablet Take 1 tablet (25 mg total) by mouth 2 (two) times daily. 60 tablet 6  . Omega-3 Fatty Acids (FISH OIL) 1000 MG CAPS Take 1,000 mg by mouth 2 (two) times daily.     . rosuvastatin (CRESTOR) 20 MG tablet Take 20 mg by mouth daily.     No current facility-administered medications for this visit.     Past Medical History:  Diagnosis Date  . Hyperlipidemia   . Hypertension   . Mobitz (type) II atrioventricular block    s/p STJ Accent pacemaker implanted by Dr [name] 10-16-2012  . Pacemaker 10/16/2012  . Personal history of colonic polyps-tubular adenomas 08/31/2013  . Vertigo     Past Surgical History:  Procedure Laterality Date  . cardiac bypass  1996   3 vessels  . CATARACT EXTRACTION W/ INTRAOCULAR LENS  IMPLANT, BILATERAL Bilateral 2010  . COLONOSCOPY    . INGUINAL HERNIA REPAIR Left 2010  . LUMBAR SPINE SURGERY  2006  . NASAL SEPTUM SURGERY  09/01/13   Dr. Adriana Reams  . PACEMAKER INSERTION  10-16-2012   STJ Accent dual chamber pacemaker implanted by Dr [name] for symptomatic Mobitz II heart block  . PERMANENT  PACEMAKER INSERTION N/A 10/16/2012   Procedure: PERMANENT PACEMAKER INSERTION;  Surgeon: Thompson Grayer, MD;  Location: [facility] CATH LAB;  Service: Cardiovascular;  Laterality: N/A;    Social History   Social History  . Marital status: Widowed    Spouse name: N/A  . Number of children: N/A  . Years of education: N/A   Occupational History  . Not on file.   Social History Main Topics  . Smoking status: Former Smoker    Packs/day: 2.00    Years: 40.00    Types: Cigarettes    Quit date: 04/02/1994  . Smokeless tobacco: Never Used  . Alcohol use No     Comment: rare 01/01/14 occasionally- A  . Drug use: No  . Sexual activity: Yes   Other Topics Concern  . Not on file   Social History Narrative  . No narrative on file     Vitals:   12/16/15 1051  BP: 122/70  Pulse: 60  Weight: 191 lb (86.6 kg)  Height: 5\' 8"  (1.727 m)    PHYSICAL EXAM General: NAD HEENT: Normal. Neck: No JVD, no thyromegaly. Lungs: Clear to auscultation bilaterally with normal respiratory effort. CV: Nondisplaced PMI.  Regular rate and rhythm, normal S1/S2, no S3/S4, no murmur. No pretibial or periankle edema.     Abdomen: Soft, nontender, no distention.  Neurologic: Alert and oriented.  Psych:  Normal affect. Skin: Normal. Musculoskeletal: No gross deformities.    ECG: Most recent ECG reviewed.      ASSESSMENT AND PLAN: 1. CHB s/p PPM: Follows with EP. Stable. Normal device fxn.  2. CAD with CABG: Nuclear stress 06/2015 showed prior MI, no ischemia. No changes to management. EF 45-50% 05/12/15.  3. HTN: Controlled. No changes.  4. Hyperlipidemia: Continue statin.  5. Bilateral leg pain: Will obtain ABI's.  Dispo: fu 1 year.  Kate Sable, M.D., F.A.C.C.

## 2015-12-16 NOTE — Patient Instructions (Addendum)
Medication Instructions:  Continue all current medications.  Labwork: none  Testing/Procedures:  Your physician has requested that you have an ankle brachial index (ABI). During this test an ultrasound and blood pressure cuff are used to evaluate the arteries that supply the arms and legs with blood. Allow thirty minutes for this exam. There are no restrictions or special instructions.  Office will contact with results via phone or letter.    Follow-Up: Your physician wants you to follow up in:  1 year.  You will receive a reminder letter in the mail one-two months in advance.  If you don't receive a letter, please call our office to schedule the follow up appointment   Any Other Special Instructions Will Be Listed Below (If Applicable).  If you need a refill on your cardiac medications before your next appointment, please call your pharmacy.  

## 2015-12-19 ENCOUNTER — Other Ambulatory Visit: Payer: Self-pay | Admitting: Cardiovascular Disease

## 2015-12-19 DIAGNOSIS — I739 Peripheral vascular disease, unspecified: Secondary | ICD-10-CM

## 2015-12-28 ENCOUNTER — Other Ambulatory Visit: Payer: Self-pay | Admitting: Cardiovascular Disease

## 2015-12-28 DIAGNOSIS — I739 Peripheral vascular disease, unspecified: Secondary | ICD-10-CM

## 2016-01-05 ENCOUNTER — Ambulatory Visit: Payer: Medicare Other

## 2016-01-05 DIAGNOSIS — I739 Peripheral vascular disease, unspecified: Secondary | ICD-10-CM

## 2016-01-05 DIAGNOSIS — R202 Paresthesia of skin: Secondary | ICD-10-CM | POA: Diagnosis not present

## 2016-01-11 ENCOUNTER — Telehealth: Payer: Self-pay | Admitting: *Deleted

## 2016-01-11 NOTE — Telephone Encounter (Signed)
Notes Recorded by Laurine Blazer, LPN on [id number] at 11:00 AM EDT Left message to return call.  ------  Notes Recorded by Herminio Commons, MD on 01/06/2016 at 9:13 AM EDT No significant blockages.

## 2016-01-16 NOTE — Telephone Encounter (Signed)
Notes Recorded by Laurine Blazer, LPN on [id number] at 9:53 AM EDT Daughter Lattie Haw) notified. Copy to pmd.

## 2016-02-10 ENCOUNTER — Encounter: Payer: Medicare Other | Admitting: Internal Medicine

## 2016-02-20 ENCOUNTER — Telehealth: Payer: Self-pay | Admitting: Cardiovascular Disease

## 2016-02-20 NOTE — Telephone Encounter (Signed)
Next available with extender is 12/7 you have opening tomorrow at 10:40 - add pt to your schedule?

## 2016-02-20 NOTE — Telephone Encounter (Signed)
Pt c/o SOB X 1 week - denies CP/dizziness/swelling - will forward to provider

## 2016-02-20 NOTE — Telephone Encounter (Signed)
Mr. Tippens daughter Lattie Haw) called stating that for the past week he continues to have a lot of shortness of breath. Doesn't really want to go to the ER.

## 2016-02-20 NOTE — Telephone Encounter (Signed)
Pt daughter aware

## 2016-02-20 NOTE — Telephone Encounter (Signed)
Have him see Gerrianne Scale (or available APP) on Wednesday for further evaluation.

## 2016-02-20 NOTE — Telephone Encounter (Signed)
Given normal stress test this year, would have him see PCP instead.

## 2016-03-02 ENCOUNTER — Encounter: Payer: Medicare Other | Admitting: Internal Medicine

## 2016-05-04 ENCOUNTER — Ambulatory Visit (INDEPENDENT_AMBULATORY_CARE_PROVIDER_SITE_OTHER): Payer: Medicare Other | Admitting: Internal Medicine

## 2016-05-04 ENCOUNTER — Encounter: Payer: Self-pay | Admitting: Internal Medicine

## 2016-05-04 VITALS — BP 121/67 | HR 58 | Ht 67.5 in | Wt 192.0 lb

## 2016-05-04 DIAGNOSIS — I25708 Atherosclerosis of coronary artery bypass graft(s), unspecified, with other forms of angina pectoris: Secondary | ICD-10-CM

## 2016-05-04 DIAGNOSIS — I442 Atrioventricular block, complete: Secondary | ICD-10-CM

## 2016-05-04 DIAGNOSIS — I10 Essential (primary) hypertension: Secondary | ICD-10-CM | POA: Diagnosis not present

## 2016-05-04 DIAGNOSIS — E8770 Fluid overload, unspecified: Secondary | ICD-10-CM

## 2016-05-04 DIAGNOSIS — I5023 Acute on chronic systolic (congestive) heart failure: Secondary | ICD-10-CM

## 2016-05-04 DIAGNOSIS — Z950 Presence of cardiac pacemaker: Secondary | ICD-10-CM | POA: Diagnosis not present

## 2016-05-04 MED ORDER — FUROSEMIDE 40 MG PO TABS: ORAL_TABLET | ORAL | 0 refills | Status: DC

## 2016-05-04 NOTE — Patient Instructions (Addendum)
Medication Instructions:   Lasix 40mg  daily x 5 days only, then only as directed by physician.    Continue all other medications.    Labwork:  BMET - do today.  Order given.  Office will contact with results via phone or letter.    Testing/Procedures: none  Follow-Up:  3-4 weeks - Dr. Bronson Ing   Your physician wants you to follow up in:  1 year.  You will receive a reminder letter in the mail one-two months in advance.  If you don't receive a letter, please call our office to schedule the follow up appointment - Dr. [name].   Any Other Special Instructions Will Be Listed Below (If Applicable). Remote monitoring is used to monitor your Pacemaker of ICD from home. This monitoring reduces the number of office visits required to check your device to one time per year. It allows [place] to keep an eye on the functioning of your device to ensure it is working properly. You are scheduled for a device check from home on 08/06/2016.  You may send your transmission at any time that day. If you have a wireless device, the transmission will be sent automatically. After your physician reviews your transmission, you will receive a postcard with your next transmission date.  If you need a refill on your cardiac medications before your next appointment, please call your pharmacy.

## 2016-05-04 NOTE — Progress Notes (Signed)
PCP: Sherrie Mustache, MD Primary Cardiologist:  Dr Bronson Ing  Ricardo Hunt is a 76 y.o. male who presents today for routine electrophysiology followup.  Since his last visit, the patient reports doing very well.  He has noticed more SOB recently.  + cough.  Today, he denies symptoms of palpitations, chest pain, shortness of breath,  lower extremity edema, presyncope, or syncope.  The patient is otherwise without complaint today.   Past Medical History:  Diagnosis Date  . Hyperlipidemia   . Hypertension   . Mobitz (type) II atrioventricular block    s/p STJ Accent pacemaker implanted by Dr [name] 10-16-2012  . Pacemaker 10/16/2012  . Personal history of colonic polyps-tubular adenomas 08/31/2013  . Vertigo    Past Surgical History:  Procedure Laterality Date  . cardiac bypass  1996   3 vessels  . CATARACT EXTRACTION W/ INTRAOCULAR LENS  IMPLANT, BILATERAL Bilateral 2010  . COLONOSCOPY    . INGUINAL HERNIA REPAIR Left 2010  . LUMBAR SPINE SURGERY  2006  . NASAL SEPTUM SURGERY  09/01/13   Dr. Adriana Reams  . PACEMAKER INSERTION  10-16-2012   STJ Accent dual chamber pacemaker implanted by Dr [name] for symptomatic Mobitz II heart block  . PERMANENT PACEMAKER INSERTION N/A 10/16/2012   Procedure: PERMANENT PACEMAKER INSERTION;  Surgeon: Thompson Grayer, MD;  Location: [facility] CATH LAB;  Service: Cardiovascular;  Laterality: N/A;    Current Outpatient Prescriptions  Medication Sig Dispense Refill  . amLODipine (NORVASC) 10 MG tablet Take 1 tablet (10 mg total) by mouth at bedtime. 30 tablet 0  . aspirin EC 81 MG tablet Take 81 mg by mouth daily.    . carvedilol (COREG) 12.5 MG tablet Take 1 tablet by mouth two  times daily 180 tablet 3  . hydrALAZINE (APRESOLINE) 25 MG tablet Take 1 tablet (25 mg total) by mouth 2 (two) times daily. 60 tablet 6  . meloxicam (MOBIC) 7.5 MG tablet Take by mouth.    . Omega-3 Fatty Acids (FISH OIL) 1000 MG CAPS Take 1,000 mg by mouth 2 (two) times daily.      . rosuvastatin (CRESTOR) 20 MG tablet Take 20 mg by mouth daily.     No current facility-administered medications for this visit.    ROS- all systems are reviewedd and negative except as above  Physical Exam: Vitals:   05/04/16 1049  BP: 121/67  Pulse: (!) 58  SpO2: 93%  Weight: 192 lb (87.1 kg)  Height: 5' 7.5" (1.715 m)    GEN- The patient is well appearing, alert and oriented x 3 today.   Head- normocephalic, atraumatic Eyes-  Sclera clear, conjunctiva pink Ears- hearing intact Oropharynx- clear Neck- jvp 9 cm Lungs- bibasilar rales, normal work of breathing Chest- pacemaker pocket is well healed Heart- Regular rate and rhythm, no murmurs, rubs or gallops, PMI not laterally displaced GI- soft, NT, ND, + BS Extremities- no clubbing, cyanosis, +1 edema  Pacemaker interrogation- reviewed in detail today,  See PACEART report  Assessment and Plan:  1.  Complete AV block Normal pacemaker function See Pace Art report No changes today  2. Acute on chronic systolic dysfunction Mildly volume overloaded today Echo 2017 reviewed 2 gram sodium diet advised bmet today Lasix 40mg  daily x 5 days then prn Return for follow-up with Dr Bronson Ing in 3-4 weeks  3. HTN Stable No change required today  4. CAD Stable No change required today  [facility] Return in 12 months to see me in [place] device  clinic Return for follow-up with Dr Bronson Ing in 3-4 weeks  Thompson Grayer MD, [facility] 05/04/2016 11:07 AM

## 2016-05-09 ENCOUNTER — Other Ambulatory Visit: Payer: Self-pay | Admitting: Cardiovascular Disease

## 2016-05-09 LAB — CUP PACEART INCLINIC DEVICE CHECK
Implantable Lead Implant Date: 20140717
Implantable Lead Implant Date: 20140717
Implantable Lead Location: 753860
Implantable Pulse Generator Implant Date: 20140717
MDC IDC LEAD LOCATION: 753859
MDC IDC PG SERIAL: 7519064
MDC IDC SESS DTM: 20180207092444

## 2016-05-10 ENCOUNTER — Encounter: Payer: Self-pay | Admitting: *Deleted

## 2016-05-14 ENCOUNTER — Telehealth: Payer: Self-pay | Admitting: *Deleted

## 2016-05-14 NOTE — Telephone Encounter (Signed)
Notes Recorded by Laurine Blazer, LPN on [id number] at [id number] PM EST Notified via voice mail. Copy to pmd. ------  Notes Recorded by Thompson Grayer, MD on 05/11/2016 at 4:32 PM EST Results reviewed. Please inform pt of result. I will route to primary care also.

## 2016-05-18 ENCOUNTER — Encounter: Payer: Self-pay | Admitting: Cardiovascular Disease

## 2016-05-18 ENCOUNTER — Ambulatory Visit (INDEPENDENT_AMBULATORY_CARE_PROVIDER_SITE_OTHER): Payer: Medicare Other | Admitting: Cardiovascular Disease

## 2016-05-18 VITALS — BP 118/66 | HR 59 | Ht 67.5 in | Wt 192.2 lb

## 2016-05-18 DIAGNOSIS — E782 Mixed hyperlipidemia: Secondary | ICD-10-CM

## 2016-05-18 DIAGNOSIS — I25708 Atherosclerosis of coronary artery bypass graft(s), unspecified, with other forms of angina pectoris: Secondary | ICD-10-CM | POA: Diagnosis not present

## 2016-05-18 DIAGNOSIS — Z950 Presence of cardiac pacemaker: Secondary | ICD-10-CM

## 2016-05-18 DIAGNOSIS — I10 Essential (primary) hypertension: Secondary | ICD-10-CM

## 2016-05-18 DIAGNOSIS — I5023 Acute on chronic systolic (congestive) heart failure: Secondary | ICD-10-CM

## 2016-05-18 MED ORDER — FUROSEMIDE 20 MG PO TABS: 20.0000 mg | ORAL_TABLET | Freq: Every day | ORAL | 3 refills | Status: DC

## 2016-05-18 NOTE — Patient Instructions (Signed)
Your physician recommends that you schedule a follow-up appointment in: [place]  Your physician has recommended you make the following change in your medication:   CHANGE LASIX 20 MG DAILY   Thank you for choosing [facility]!!

## 2016-05-18 NOTE — Progress Notes (Signed)
SUBJECTIVE: The patient presents for follow-up of coronary artery disease (3-vessel CABG in 1996) and acute on chronic systolic heart failure. He has a pacemaker for complete heart block.  Nuclear stress test on 06/13/15 showed old myocardial infarction with no ischemia. Left  Echocardiogram 05/12/15: Mild LVH with LVEF 45-50%, hypokinesis of the apical anteroseptal   and basal inferior walls noted. Grade 1 diastolic dysfunction   with grossly normal LV filling pressure. Mild left and right   atrial enlargement. Moderately sclerotic aortic valve without   stenosis. Device wire noted within the right heart. Trivial   tricuspid regurgitation with PASP estimated 34 mmHg.  Weight stable at 192 pounds today, no change since 05/04/16. Took Lasix 40 mg for 5 days.  He then took Lasix 20 mg for 4 days in a row starting this past Saturday. He feels less short of breath. He denies chest pain.  He says, "I love salt".  Recent labs: BUN 25, creatinine 0.87, sodium 142, potassium 4.4.   Review of Systems: As per "subjective", otherwise negative.  No Known Allergies  Current Outpatient Prescriptions  Medication Sig Dispense Refill  . amLODipine (NORVASC) 10 MG tablet Take 1 tablet (10 mg total) by mouth at bedtime. 30 tablet 0  . aspirin EC 81 MG tablet Take 81 mg by mouth daily.    . carvedilol (COREG) 12.5 MG tablet TAKE 1 TABLET BY MOUTH TWO  TIMES DAILY 180 tablet 1  . furosemide (LASIX) 40 MG tablet Take one tab daily by mouth x 5 days, then only as directed by physician 30 tablet 0  . hydrALAZINE (APRESOLINE) 25 MG tablet Take 1 tablet (25 mg total) by mouth 2 (two) times daily. 60 tablet 6  . meloxicam (MOBIC) 7.5 MG tablet Take by mouth.    . Omega-3 Fatty Acids (FISH OIL) 1000 MG CAPS Take 1,000 mg by mouth 2 (two) times daily.     . rosuvastatin (CRESTOR) 20 MG tablet Take 20 mg by mouth daily.     No current facility-administered medications for this visit.     Past Medical  History:  Diagnosis Date  . Hyperlipidemia   . Hypertension   . Mobitz (type) II atrioventricular block    s/p STJ Accent pacemaker implanted by Dr [name] 10-16-2012  . Pacemaker 10/16/2012  . Personal history of colonic polyps-tubular adenomas 08/31/2013  . Vertigo     Past Surgical History:  Procedure Laterality Date  . cardiac bypass  1996   3 vessels  . CATARACT EXTRACTION W/ INTRAOCULAR LENS  IMPLANT, BILATERAL Bilateral 2010  . COLONOSCOPY    . INGUINAL HERNIA REPAIR Left 2010  . LUMBAR SPINE SURGERY  2006  . NASAL SEPTUM SURGERY  09/01/13   Dr. Adriana Reams  . PACEMAKER INSERTION  10-16-2012   STJ Accent dual chamber pacemaker implanted by Dr [name] for symptomatic Mobitz II heart block  . PERMANENT PACEMAKER INSERTION N/A 10/16/2012   Procedure: PERMANENT PACEMAKER INSERTION;  Surgeon: Thompson Grayer, MD;  Location: [facility] CATH LAB;  Service: Cardiovascular;  Laterality: N/A;    Social History   Social History  . Marital status: Widowed    Spouse name: N/A  . Number of children: N/A  . Years of education: N/A   Occupational History  . Not on file.   Social History Main Topics  . Smoking status: Former Smoker    Packs/day: 2.00    Years: 40.00    Types: Cigarettes    Quit date: 04/02/1994  .  Smokeless tobacco: Never Used  . Alcohol use No     Comment: rare 01/01/14 occasionally- A  . Drug use: No  . Sexual activity: Yes   Other Topics Concern  . Not on file   Social History Narrative  . No narrative on file     Vitals:   05/18/16 1008  BP: 118/66  Pulse: (!) 59  SpO2: 96%  Weight: 192 lb 3.2 oz (87.2 kg)  Height: 5' 7.5" (1.715 m)    PHYSICAL EXAM General: NAD HEENT: Normal. Neck: No JVD, no thyromegaly. Lungs: Faint bibasilar rales. CV: Nondisplaced PMI.  Regular rate and mostly regular rhythm, normal S1/S2, no S3/S4, no murmur. Trace b/l pitting pretibial edema.     Abdomen: Soft, nontender, no distention.  Neurologic: Alert and oriented.  Psych:  Normal affect. Skin: Normal. Musculoskeletal: No gross deformities.    ECG: Most recent ECG reviewed.      ASSESSMENT AND PLAN:  1. CHB s/p PPM: Follows with EP. Stable. Normal device fxn.  2. CAD with CABG: Nuclear stress 06/2015 showed prior MI, no ischemia. No changes to management which includes aspirin, carvedilol, and Crestor. EF 45-50% 05/12/15.  3. HTN: Controlled. No changes.  4. Hyperlipidemia: Continue statin.  5. Bilateral leg pain: Normal ABI's 01/2016.  6. Acute on chronic systolic heart failure: I will start Lasix 20 mg daily. I have advised him on the importance of a low sodium diet.  Dispo: fu 2 months  Kate Sable, M.D., F.A.C.C.

## 2016-05-25 ENCOUNTER — Other Ambulatory Visit: Payer: Self-pay | Admitting: Neurological Surgery

## 2016-05-25 DIAGNOSIS — M545 Low back pain: Principal | ICD-10-CM

## 2016-05-25 DIAGNOSIS — G8929 Other chronic pain: Secondary | ICD-10-CM

## 2016-06-06 ENCOUNTER — Encounter: Payer: Self-pay | Admitting: Radiology

## 2016-06-06 ENCOUNTER — Ambulatory Visit
Admission: RE | Admit: 2016-06-06 | Discharge: 2016-06-06 | Disposition: A | Discharge status: Home / Self Care | Payer: Medicare Other | Source: Ambulatory Visit | Attending: Neurological Surgery | Admitting: Neurological Surgery

## 2016-06-06 DIAGNOSIS — M545 Low back pain: Principal | ICD-10-CM

## 2016-06-06 DIAGNOSIS — G8929 Other chronic pain: Secondary | ICD-10-CM

## 2016-06-06 MED ORDER — IOPAMIDOL (ISOVUE-M 200) INJECTION 41%: 15.0000 mL | Freq: Once | INTRAMUSCULAR | Status: DC

## 2016-06-06 MED ORDER — DIAZEPAM 5 MG PO TABS
5.0000 mg | ORAL_TABLET | Freq: Once | ORAL | Status: AC
  Administered 2016-06-06: 5 mg via ORAL

## 2016-06-06 NOTE — Discharge Instructions (Signed)

## 2016-06-25 ENCOUNTER — Other Ambulatory Visit: Payer: Self-pay | Admitting: Neurological Surgery

## 2016-06-28 ENCOUNTER — Ambulatory Visit (INDEPENDENT_AMBULATORY_CARE_PROVIDER_SITE_OTHER): Payer: Medicare Other | Admitting: Cardiovascular Disease

## 2016-06-28 ENCOUNTER — Encounter: Payer: Self-pay | Admitting: Cardiovascular Disease

## 2016-06-28 VITALS — BP 142/80 | HR 75 | Ht 67.5 in | Wt 194.0 lb

## 2016-06-28 DIAGNOSIS — Z950 Presence of cardiac pacemaker: Secondary | ICD-10-CM

## 2016-06-28 DIAGNOSIS — I10 Essential (primary) hypertension: Secondary | ICD-10-CM

## 2016-06-28 DIAGNOSIS — I5022 Chronic systolic (congestive) heart failure: Secondary | ICD-10-CM | POA: Diagnosis not present

## 2016-06-28 DIAGNOSIS — Z01818 Encounter for other preprocedural examination: Secondary | ICD-10-CM

## 2016-06-28 DIAGNOSIS — I25708 Atherosclerosis of coronary artery bypass graft(s), unspecified, with other forms of angina pectoris: Secondary | ICD-10-CM

## 2016-06-28 DIAGNOSIS — E782 Mixed hyperlipidemia: Secondary | ICD-10-CM

## 2016-06-28 NOTE — Patient Instructions (Signed)

## 2016-06-28 NOTE — Progress Notes (Signed)
SUBJECTIVE: The patient presents for preoperative risk stratification. He is scheduled to undergo a right L4-5, bilateral L2-3 laminectomy under general anesthesia on 07/19/16 by Dr. Sherley Bounds.  Cardiovascular history includes chronic systolic heart failure, coronary artery disease with CABG, and complete heart block for which she has a pacemaker.  Nuclear stress test in March 2017 showed prior MI with no evidence of ischemia. Echocardiogram on 05/12/15 showed mildly reduced left ventricular systolic function, LVEF 97-67%, with grade 1 diastolic dysfunction and normal filling pressures.  I started him on Lasix 20 mg daily at his last visit on 05/18/16. I also advised him not to consume excess sodium.  Weight is 194 pounds today, and had been 192 pounds on 05/18/16.  With regards to coronary artery disease, he denies chest pain.  With regards to chronic systolic heart failure, he has chronic exertional dyspnea which has not gotten any worse. He quit smoking in 1996. He denies increased leg swelling. He continues to salt his food.     Review of Systems: As per "subjective", otherwise negative.  No Known Allergies  Current Outpatient Prescriptions  Medication Sig Dispense Refill  . amLODipine (NORVASC) 10 MG tablet Take 1 tablet (10 mg total) by mouth at bedtime. 30 tablet 0  . aspirin EC 81 MG tablet Take 81 mg by mouth daily.    . carvedilol (COREG) 12.5 MG tablet TAKE 1 TABLET BY MOUTH TWO  TIMES DAILY 180 tablet 1  . furosemide (LASIX) 20 MG tablet Take 1 tablet (20 mg total) by mouth daily. 30 tablet 3  . hydrALAZINE (APRESOLINE) 25 MG tablet Take 1 tablet (25 mg total) by mouth 2 (two) times daily. 60 tablet 6  . meloxicam (MOBIC) 7.5 MG tablet Take by mouth.    . Omega-3 Fatty Acids (FISH OIL) 1000 MG CAPS Take 1,000 mg by mouth 2 (two) times daily.     . rosuvastatin (CRESTOR) 20 MG tablet Take 20 mg by mouth daily.     No current facility-administered medications for this  visit.     Past Medical History:  Diagnosis Date  . Hyperlipidemia   . Hypertension   . Mobitz (type) II atrioventricular block    s/p STJ Accent pacemaker implanted by Dr [name] 10-16-2012  . Pacemaker 10/16/2012  . Personal history of colonic polyps-tubular adenomas 08/31/2013  . Vertigo     Past Surgical History:  Procedure Laterality Date  . cardiac bypass  1996   3 vessels  . CATARACT EXTRACTION W/ INTRAOCULAR LENS  IMPLANT, BILATERAL Bilateral 2010  . COLONOSCOPY    . INGUINAL HERNIA REPAIR Left 2010  . LUMBAR SPINE SURGERY  2006  . NASAL SEPTUM SURGERY  09/01/13   Dr. Adriana Reams  . PACEMAKER INSERTION  10-16-2012   STJ Accent dual chamber pacemaker implanted by Dr [name] for symptomatic Mobitz II heart block  . PERMANENT PACEMAKER INSERTION N/A 10/16/2012   Procedure: PERMANENT PACEMAKER INSERTION;  Surgeon: Thompson Grayer, MD;  Location: [facility] CATH LAB;  Service: Cardiovascular;  Laterality: N/A;    Social History   Social History  . Marital status: Widowed    Spouse name: N/A  . Number of children: N/A  . Years of education: N/A   Occupational History  . Not on file.   Social History Main Topics  . Smoking status: Former Smoker    Packs/day: 2.00    Years: 40.00    Types: Cigarettes    Quit date: 04/02/1994  . Smokeless tobacco:  Never Used  . Alcohol use No     Comment: rare 01/01/14 occasionally- A  . Drug use: No  . Sexual activity: Yes   Other Topics Concern  . Not on file   Social History Narrative  . No narrative on file     Vitals:   06/28/16 1558  BP: (!) 142/80  Pulse: 75  SpO2: 95%  Weight: 194 lb (88 kg)  Height: 5' 7.5" (1.715 m)    PHYSICAL EXAM General: NAD HEENT: Normal. Neck: No JVD, no thyromegaly. Lungs: Clear to auscultation bilaterally with normal respiratory effort. CV: Nondisplaced PMI.  Regular rate and rhythm, normal S1/S2, no S3/S4, no murmur. No pretibial or periankle edema.    Abdomen: Soft, nontender, no distention.    Neurologic: Alert and oriented.  Psych: Normal affect. Skin: Normal. Musculoskeletal: No gross deformities.    ECG: Most recent ECG reviewed.      ASSESSMENT AND PLAN: 1. CHB s/p PPM: Stable. Pacemaker is functioning normally. He follows with EP.  2. CAD with CABG: Symptomatically stable on aspirin, carvedilol, and Crestor. Most recent nuclear stress test reviewed above with no evidence of ischemia in March 2017. No changes to therapy.  3. HTN: Mildly elevated. I will monitor this. I encouraged low sodium intake.  4. Hyperlipidemia: Continue statin.  5. Chronic systolic heart failure: Euvolemic on Lasix 20 mg daily. I again emphasized the importance of low sodium intake.  6. Preoperative risk stratification: He can proceed with the aforementioned surgery at an acceptable level of risk. I filled out pertaining documentation and will have it faxed to the surgeon's office.   Dispo: fu 6 months   Kate Sable, M.D., F.A.C.C.

## 2016-07-11 ENCOUNTER — Encounter (HOSPITAL_COMMUNITY)
Admission: RE | Admit: 2016-07-11 | Discharge: 2016-07-11 | Disposition: A | Discharge status: Home / Self Care | Payer: Medicare Other | Source: Ambulatory Visit | Attending: Neurological Surgery | Admitting: Neurological Surgery

## 2016-07-11 ENCOUNTER — Encounter (HOSPITAL_COMMUNITY): Payer: Self-pay

## 2016-07-11 ENCOUNTER — Ambulatory Visit (HOSPITAL_COMMUNITY)
Admission: RE | Admit: 2016-07-11 | Discharge: 2016-07-11 | Disposition: A | Discharge status: Home / Self Care | Payer: Medicare Other | Source: Ambulatory Visit | Attending: Neurological Surgery | Admitting: Neurological Surgery

## 2016-07-11 DIAGNOSIS — Z9889 Other specified postprocedural states: Secondary | ICD-10-CM | POA: Insufficient documentation

## 2016-07-11 DIAGNOSIS — Z01812 Encounter for preprocedural laboratory examination: Secondary | ICD-10-CM | POA: Diagnosis not present

## 2016-07-11 DIAGNOSIS — I2510 Atherosclerotic heart disease of native coronary artery without angina pectoris: Secondary | ICD-10-CM | POA: Insufficient documentation

## 2016-07-11 DIAGNOSIS — I10 Essential (primary) hypertension: Secondary | ICD-10-CM | POA: Diagnosis not present

## 2016-07-11 DIAGNOSIS — Z950 Presence of cardiac pacemaker: Secondary | ICD-10-CM | POA: Diagnosis not present

## 2016-07-11 DIAGNOSIS — I5022 Chronic systolic (congestive) heart failure: Secondary | ICD-10-CM | POA: Insufficient documentation

## 2016-07-11 DIAGNOSIS — Z87891 Personal history of nicotine dependence: Secondary | ICD-10-CM | POA: Diagnosis not present

## 2016-07-11 DIAGNOSIS — Z7982 Long term (current) use of aspirin: Secondary | ICD-10-CM | POA: Insufficient documentation

## 2016-07-11 DIAGNOSIS — M48061 Spinal stenosis, lumbar region without neurogenic claudication: Secondary | ICD-10-CM

## 2016-07-11 DIAGNOSIS — Z01818 Encounter for other preprocedural examination: Secondary | ICD-10-CM | POA: Diagnosis present

## 2016-07-11 DIAGNOSIS — I700 Atherosclerosis of aorta: Secondary | ICD-10-CM | POA: Insufficient documentation

## 2016-07-11 DIAGNOSIS — Z79899 Other long term (current) drug therapy: Secondary | ICD-10-CM | POA: Diagnosis not present

## 2016-07-11 DIAGNOSIS — E785 Hyperlipidemia, unspecified: Secondary | ICD-10-CM | POA: Insufficient documentation

## 2016-07-11 HISTORY — DX: Unspecified osteoarthritis, unspecified site: M19.90

## 2016-07-11 HISTORY — DX: Gastro-esophageal reflux disease without esophagitis: K21.9

## 2016-07-11 HISTORY — DX: Atherosclerotic heart disease of native coronary artery without angina pectoris: I25.10

## 2016-07-11 HISTORY — DX: Acute myocardial infarction, unspecified: I21.9

## 2016-07-11 LAB — CBC WITH DIFFERENTIAL/PLATELET
BASOS PCT: 1 %
Basophils Absolute: 0 10*3/uL (ref 0.0–0.1)
Eosinophils Absolute: 0.5 10*3/uL (ref 0.0–0.7)
Eosinophils Relative: 7 %
HEMATOCRIT: 45.1 % (ref 39.0–52.0)
Hemoglobin: 14.7 g/dL (ref 13.0–17.0)
LYMPHS ABS: 1.3 10*3/uL (ref 0.7–4.0)
Lymphocytes Relative: 18 %
MCH: 30.8 pg (ref 26.0–34.0)
MCHC: 32.6 g/dL (ref 30.0–36.0)
MCV: 94.5 fL (ref 78.0–100.0)
MONO ABS: 0.5 10*3/uL (ref 0.1–1.0)
MONOS PCT: 7 %
NEUTROS ABS: 5.1 10*3/uL (ref 1.7–7.7)
Neutrophils Relative %: 67 %
Platelets: 175 10*3/uL (ref 150–400)
RBC: 4.77 MIL/uL (ref 4.22–5.81)
RDW: 14.6 % (ref 11.5–15.5)
WBC: 7.5 10*3/uL (ref 4.0–10.5)

## 2016-07-11 LAB — BASIC METABOLIC PANEL
ANION GAP: 11 (ref 5–15)
BUN: 24 mg/dL — ABNORMAL HIGH (ref 6–20)
CALCIUM: 9.9 mg/dL (ref 8.9–10.3)
CHLORIDE: 103 mmol/L (ref 101–111)
CO2: 28 mmol/L (ref 22–32)
Creatinine, Ser: 1.06 mg/dL (ref 0.61–1.24)
GFR calc Af Amer: >60 mL/min (ref >60)
GFR calc non Af Amer: >60 mL/min (ref >60)
GLUCOSE: 104 mg/dL — AB (ref 65–99)
Potassium: 4.3 mmol/L (ref 3.5–5.1)
Sodium: 142 mmol/L (ref 135–145)

## 2016-07-11 LAB — PROTIME-INR
INR: 1.1
Prothrombin Time: 14.3 seconds (ref 11.4–15.2)

## 2016-07-11 LAB — SURGICAL PCR SCREEN
MRSA, PCR: POSITIVE — AB
Staphylococcus aureus: POSITIVE — AB

## 2016-07-11 NOTE — Pre-Procedure Instructions (Signed)
Ricardo Hunt  07/11/2016      [id number] - [place], [place] [address] Phone: [phone] Fax: [place], [place] [address] [place] [address] Phone: [phone] Fax: [place], [place] [address] Phone: [phone] Fax: [phone]    Your procedure is scheduled on : 07/19/2016  Report to [facility] Admitting at 8:30 A.M.  Call this number if you have problems the morning of surgery:  [phone]   Remember:  Do not eat food or drink liquids after midnight.  On Wednesday night    Take these medicines the morning of surgery with A SIP OF WATER :Carvedilol & Hydralazine   Stop aspirin, meloxicam, fish oil on 4/14        Do not wear jewelry   Do not wear lotions, powders, or perfumes, or deoderant.    Men may shave face and neck.   Do not bring valuables to the hospital.   [facility] is not responsible for any belongings or valuables.  Contacts, dentures or bridgework may not be worn into surgery.  Leave your suitcase in the car.  After surgery it may be brought to your room.  For patients admitted to the hospital, discharge time will be determined by your treatment team.  Patients discharged the day of surgery will not be allowed to drive home.   Name and phone number of your driver:   With family  Special instructions:  Special Instructions: [place] - Preparing for Surgery  Before surgery, you can play an important role.  Because skin is not sterile, your skin needs to be as free of germs as possible.  You can reduce the number of germs on you skin by washing with CHG (chlorahexidine gluconate) soap before surgery.  CHG is an antiseptic cleaner which kills germs and bonds with the skin to continue killing germs even after  washing.  Please DO NOT use if you have an allergy to CHG or antibacterial soaps.  If your skin becomes reddened/irritated stop using the CHG and inform your nurse when you arrive at Short Stay.  Do not shave (including legs and underarms) for at least 48 hours prior to the first CHG shower.  You may shave your face.  Please follow these instructions carefully:   1.  Shower with CHG Soap the night before surgery and the  morning of Surgery.  2.  If you choose to wash your hair, wash your hair first as usual with your  normal shampoo.  3.  After you shampoo, rinse your hair and body thoroughly to remove the  Shampoo.  4.  Use CHG as you would any other liquid soap.  You can apply chg directly to the skin and wash gently with scrungie or a clean washcloth.  5.  Apply the CHG Soap to your body ONLY FROM THE NECK DOWN.    Do not use on open wounds or open sores.  Avoid contact with your eyes, ears, mouth and genitals (private parts).  Wash genitals (private parts)   with your normal soap.  6.  Wash thoroughly, paying special attention to the area where your surgery will be performed.  7.  Thoroughly rinse your body with warm water from the neck down.  8.  DO NOT  shower/wash with your normal soap after using and rinsing off   the CHG Soap.  9.  Pat yourself dry with a clean towel.            10.  Wear clean pajamas.            11.  Place clean sheets on your bed the night of your first shower and do not sleep with pets.  Day of Surgery  Do not apply any lotions/deodorants the morning of surgery.  Please wear clean clothes to the hospital/surgery center.  Please read over the following fact sheets that you were given. Pain Booklet, MRSA Information and Surgical Site Infection Prevention

## 2016-07-11 NOTE — Progress Notes (Addendum)
Pt. Breathing noticably labored upon entereing PAT office after walking from waiting area to the office. Pt. Followed by Dr. [name] for pacemaker & Dr. Bronson Ing for cardiac care.  Chart will be refrerred  to anesth. For review.  Pt. Denies recent flu symptoms but admits that he may require some "extra Oxygen while in the hosp."  Pt. Does not use O2 at home or use an inhaler. Pt. Reports that he is still working an "active" job at [employer], where he has to move up & down- (bending) to do his job for 12 hr. shifts

## 2016-07-12 NOTE — Progress Notes (Signed)
Call made to A. Kabbe,DNP, related to CXR result.  The chart will be further reviewed by anesthesia.

## 2016-07-13 ENCOUNTER — Encounter (HOSPITAL_COMMUNITY): Payer: Self-pay

## 2016-07-13 NOTE — Progress Notes (Addendum)
Anesthesia Chart Review: Patient is a 76 year old male scheduled for right L4-5 hemilaminectomy and bilateral L2-3 laminectomy on 07/19/16 by Dr. Ronnald Ramp.  History includes former smoker (quit '96), CAD s/p CABG '96, chronic systolic CHF, hypercholesterolemia, HTN, HLD, 2nd degree AVB Mobitz II s/p St. Jude PPM 10/16/12, MI, GERD, arthritis, L3-4 laminectomy 06/24/03, left inguinal hernia repair '10, nasal septal surgery.   - PCP is listed as Dr. Dione Housekeeper. - Cardiologist is Dr. Kate Sable. He saw patient on 06/28/16. He wrote, "With regards to coronary artery disease, he denies chest pain. With regards to chronic systolic heart failure, he has chronic exertional dyspnea which has not gotten any worse. He quit smoking in 1996. He denies increased leg swelling. He continues to salt his food." He ultimately felt patient was "acceptable" risk for surgery.   Meds include amlodipine, ASA 81 mg (hold starting 4/14//18), Coreg, doxylamine succinate, Lasix, hydralazine, fish oil, ranitidine, Crestor.  BP 122/68   Pulse (!) 57   Temp 36.7 C   Resp 20   Ht 5\' 7"  (1.702 m)   Wt 189 lb 6.4 oz (85.9 kg)   SpO2 99%   BMI 29.66 kg/m   EKG 05/18/16: Demand pacemaker, interpretation is based on intrinsic rhythm, SR with first degree AV block with frequent PVCs, LAD, non-specific intraventricular block, possible lateral infarct (age undetermined), inferior infarct (age undetermined).   Nuclear stress test 06/13/15:  Findings consistent with prior myocardial infarction.  This is an intermediate risk study.  The left ventricular ejection fraction is moderately decreased (30-44%). Per Dr. Harl Bowie on 06/14/15, "Stress test shows no new blockages, only evidence of old blockages. Dr Raliegh Ip will discuss further at f/u, nothing new to do at this time."  Echo 05/12/15: Impressions: - Mild LVH with LVEF 45-50%, hypokinesis of the apical anteroseptal   and basal inferior walls noted. Grade 1 diastolic dysfunction  with grossly normal LV filling pressure. Mild left and right   atrial enlargement. Moderately sclerotic aortic valve without   stenosis. Device wire noted within the right heart. Trivial   tricuspid regurgitation with PASP estimated 34 mmHg.  CXR 07/11/16: IMPRESSION: Chronic bronchitic -smoking related changes. Mild cardiomegaly with central pulmonary vascular congestion slightly more conspicuous than on the previous studies. This suggests low-grade CHF. The ICD is in stable position. Thoracic aortic atherosclerosis.  Preoperative labs noted. Cr 1.06. Glucose 104. CBC, PT/INR WNL.   Anesthesia was not consulted to evaluated Mr. Down during PAT. His CXR was suggestive of low-grade CHF. His PAT RN reported that she felt patient had some degree of labored breathing during his interview, but that he and his daughter did not feel there was any significant changes. His O2 sat was 99%. Weight was 189 lb, which was down from prior. I have left a voice message for patient to call me to further clarify how he is is feeling symptoms wise. In the interim, I did reach out to Dr. Bronson Ing to see review CXR results and to inquire whether he felt patient should be re-evaluated prior to surgery. He replied, "Does not need reevaluation, unless new/worsening symptoms. If necessary, can increase Lasix to 40 mg bid x 2 days to see if any symptom improvement."  Chart will be left for follow-up until I am able to speak further with Mr. Sackmann.   George Hugh  [facility] Short Stay Center/Anesthesiology Phone [phone] 07/13/2016 3:51 PM  Addendum: I spoke with patient's daughter Vista Deck. Her father's phone number is [phone] (not listed  in [place]). She reports her father's SOB seems to worsen around January, which is one reason why she wanted to make sure he was evaluated by Dr. Dwana Curd prior to surgery. She thought his breathing could be a tad worse since then, but did not notice any significant  changes. (I have left a voice message with him to verify.) He does not require home O2. I discussed Dr. Court Joy input regarding increasing Lasix up to 40 mg BID X 2 days if he was feeling more SOB, but that he would need to contact Dr. Court Joy office on 07/16/16 if he was still feeling more SOB. She will review with her dad, if I am unable to reach him by the end of my work day. I will plan to follow-up with patient next week.  George Hugh [facility] Short Stay Center/Anesthesiology Phone [phone] 07/13/2016 4:55 PM  Addendum: I called and spoke with patient. He was not a great historian, but said he felt his breathing was "about the same" to "a little better" than when he was evaluated by Dr. Bronson Ing. He denied SOB at rest, but does have chronic DOE. He did not feel he needed to take additional Lasix. I also called and spoke again with his daughter Lattie Haw who sees him several times a week. She confirmed that she did not notice any worsening SOB. I did not note any conversational dyspnea when I spoke with Mr. Staten on the phone. Based on input from Dr. Bronson Ing, and patient indicating that he felt his breathing was stable or a little better since his clearance visit then I would anticipate that he could proceed as planned. However, I have not physically examined Mr. Bolotin, so further evaluation by his anesthesiologist on the day of surgery to determine definitive plan.   George Hugh [facility] Short Stay Center/Anesthesiology Phone [phone] 07/17/2016 5:23 PM

## 2016-07-19 ENCOUNTER — Inpatient Hospital Stay (HOSPITAL_COMMUNITY): Payer: Medicare Other

## 2016-07-19 ENCOUNTER — Inpatient Hospital Stay (HOSPITAL_COMMUNITY): Payer: Medicare Other | Admitting: Emergency Medicine

## 2016-07-19 ENCOUNTER — Inpatient Hospital Stay (HOSPITAL_COMMUNITY): Payer: Medicare Other | Admitting: Anesthesiology

## 2016-07-19 ENCOUNTER — Ambulatory Visit: Payer: Medicare Other | Admitting: Cardiovascular Disease

## 2016-07-19 ENCOUNTER — Encounter (HOSPITAL_COMMUNITY)
Admission: RE | Disposition: A | Discharge status: Home / Self Care | Payer: Self-pay | Source: Ambulatory Visit | Attending: Neurological Surgery

## 2016-07-19 ENCOUNTER — Observation Stay (HOSPITAL_COMMUNITY)
Admission: RE | Admit: 2016-07-19 | Discharge: 2016-07-19 | Disposition: A | Discharge status: Home / Self Care | Payer: Medicare Other | Source: Ambulatory Visit | Attending: Neurological Surgery | Admitting: Neurological Surgery

## 2016-07-19 ENCOUNTER — Encounter (HOSPITAL_COMMUNITY): Payer: Self-pay | Admitting: Neurological Surgery

## 2016-07-19 DIAGNOSIS — Z9889 Other specified postprocedural states: Secondary | ICD-10-CM

## 2016-07-19 DIAGNOSIS — Z7982 Long term (current) use of aspirin: Secondary | ICD-10-CM | POA: Diagnosis not present

## 2016-07-19 DIAGNOSIS — Z950 Presence of cardiac pacemaker: Secondary | ICD-10-CM | POA: Insufficient documentation

## 2016-07-19 DIAGNOSIS — E785 Hyperlipidemia, unspecified: Secondary | ICD-10-CM | POA: Insufficient documentation

## 2016-07-19 DIAGNOSIS — Z951 Presence of aortocoronary bypass graft: Secondary | ICD-10-CM | POA: Insufficient documentation

## 2016-07-19 DIAGNOSIS — K219 Gastro-esophageal reflux disease without esophagitis: Secondary | ICD-10-CM | POA: Insufficient documentation

## 2016-07-19 DIAGNOSIS — Z87891 Personal history of nicotine dependence: Secondary | ICD-10-CM | POA: Diagnosis not present

## 2016-07-19 DIAGNOSIS — M48061 Spinal stenosis, lumbar region without neurogenic claudication: Principal | ICD-10-CM | POA: Insufficient documentation

## 2016-07-19 DIAGNOSIS — I509 Heart failure, unspecified: Secondary | ICD-10-CM | POA: Diagnosis not present

## 2016-07-19 DIAGNOSIS — I110 Hypertensive heart disease with heart failure: Secondary | ICD-10-CM | POA: Insufficient documentation

## 2016-07-19 DIAGNOSIS — I2510 Atherosclerotic heart disease of native coronary artery without angina pectoris: Secondary | ICD-10-CM | POA: Insufficient documentation

## 2016-07-19 DIAGNOSIS — I252 Old myocardial infarction: Secondary | ICD-10-CM | POA: Insufficient documentation

## 2016-07-19 DIAGNOSIS — Z79899 Other long term (current) drug therapy: Secondary | ICD-10-CM | POA: Insufficient documentation

## 2016-07-19 DIAGNOSIS — Z419 Encounter for procedure for purposes other than remedying health state, unspecified: Secondary | ICD-10-CM

## 2016-07-19 HISTORY — PX: LUMBAR LAMINECTOMY/DECOMPRESSION MICRODISCECTOMY: SHX5026

## 2016-07-19 SURGERY — LUMBAR LAMINECTOMY/DECOMPRESSION MICRODISCECTOMY 2 LEVELS
Anesthesia: General | Site: Back | Laterality: Right

## 2016-07-19 MED ORDER — PROPOFOL 10 MG/ML IV BOLUS
INTRAVENOUS | Status: AC
  Filled 2016-07-19: qty 40

## 2016-07-19 MED ORDER — SODIUM CHLORIDE 0.9 % IV SOLN: 250.0000 mL | INTRAVENOUS | Status: DC

## 2016-07-19 MED ORDER — ONDANSETRON HCL 4 MG/2ML IJ SOLN
INTRAMUSCULAR | Status: DC | PRN
  Administered 2016-07-19: 4 mg via INTRAVENOUS

## 2016-07-19 MED ORDER — ONDANSETRON HCL 4 MG/2ML IJ SOLN: 4.0000 mg | Freq: Four times a day (QID) | INTRAMUSCULAR | Status: DC | PRN

## 2016-07-19 MED ORDER — SODIUM CHLORIDE 0.9% FLUSH: 3.0000 mL | INTRAVENOUS | Status: DC | PRN

## 2016-07-19 MED ORDER — ACETAMINOPHEN 325 MG PO TABS: 650.0000 mg | ORAL_TABLET | ORAL | Status: DC | PRN

## 2016-07-19 MED ORDER — OXYCODONE HCL 5 MG PO TABS: 5.0000 mg | ORAL_TABLET | Freq: Once | ORAL | Status: DC | PRN

## 2016-07-19 MED ORDER — 0.9 % SODIUM CHLORIDE (POUR BTL) OPTIME
TOPICAL | Status: DC | PRN
  Administered 2016-07-19: 1000 mL

## 2016-07-19 MED ORDER — METHOCARBAMOL 1000 MG/10ML IJ SOLN
500.0000 mg | Freq: Four times a day (QID) | INTRAVENOUS | Status: DC | PRN
  Filled 2016-07-19: qty 5

## 2016-07-19 MED ORDER — HYDROMORPHONE HCL 1 MG/ML IJ SOLN
INTRAMUSCULAR | Status: AC
  Filled 2016-07-19: qty 0.5

## 2016-07-19 MED ORDER — HYDRALAZINE HCL 25 MG PO TABS
25.0000 mg | ORAL_TABLET | Freq: Two times a day (BID) | ORAL | Status: DC
  Filled 2016-07-19: qty 1

## 2016-07-19 MED ORDER — SODIUM CHLORIDE 0.9% FLUSH: 3.0000 mL | Freq: Two times a day (BID) | INTRAVENOUS | Status: DC

## 2016-07-19 MED ORDER — ACETAMINOPHEN 650 MG RE SUPP: 650.0000 mg | RECTAL | Status: DC | PRN

## 2016-07-19 MED ORDER — FUROSEMIDE 20 MG PO TABS
20.0000 mg | ORAL_TABLET | Freq: Every day | ORAL | Status: DC
  Filled 2016-07-19: qty 1

## 2016-07-19 MED ORDER — CHLORHEXIDINE GLUCONATE CLOTH 2 % EX PADS: 6.0000 | MEDICATED_PAD | Freq: Once | CUTANEOUS | Status: DC

## 2016-07-19 MED ORDER — MORPHINE SULFATE (PF) 4 MG/ML IV SOLN: 2.0000 mg | INTRAVENOUS | Status: DC | PRN

## 2016-07-19 MED ORDER — BUPIVACAINE HCL (PF) 0.25 % IJ SOLN
INTRAMUSCULAR | Status: DC | PRN
  Administered 2016-07-19: 3 mL

## 2016-07-19 MED ORDER — DEXAMETHASONE SODIUM PHOSPHATE 10 MG/ML IJ SOLN
INTRAMUSCULAR | Status: DC | PRN
  Administered 2016-07-19: 10 mg via INTRAVENOUS

## 2016-07-19 MED ORDER — CEFAZOLIN SODIUM-DEXTROSE 2-4 GM/100ML-% IV SOLN: 2.0000 g | Freq: Three times a day (TID) | INTRAVENOUS | Status: DC

## 2016-07-19 MED ORDER — THROMBIN 5000 UNITS EX SOLR
CUTANEOUS | Status: AC
  Filled 2016-07-19: qty 15000

## 2016-07-19 MED ORDER — PHENOL 1.4 % MT LIQD: 1.0000 | OROMUCOSAL | Status: DC | PRN

## 2016-07-19 MED ORDER — FENTANYL CITRATE (PF) 250 MCG/5ML IJ SOLN
INTRAMUSCULAR | Status: AC
  Filled 2016-07-19: qty 5

## 2016-07-19 MED ORDER — BACITRACIN 50000 UNITS IM SOLR
INTRAMUSCULAR | Status: DC | PRN
  Administered 2016-07-19: 500 mL

## 2016-07-19 MED ORDER — POTASSIUM CHLORIDE IN NACL 20-0.9 MEQ/L-% IV SOLN: INTRAVENOUS | Status: DC

## 2016-07-19 MED ORDER — DEXAMETHASONE SODIUM PHOSPHATE 10 MG/ML IJ SOLN
INTRAMUSCULAR | Status: AC
  Filled 2016-07-19: qty 1

## 2016-07-19 MED ORDER — LACTATED RINGERS IV SOLN
INTRAVENOUS | Status: DC
  Administered 2016-07-19: 50 mL/h via INTRAVENOUS

## 2016-07-19 MED ORDER — ONDANSETRON HCL 4 MG PO TABS: 4.0000 mg | ORAL_TABLET | Freq: Four times a day (QID) | ORAL | Status: DC | PRN

## 2016-07-19 MED ORDER — HYDROCODONE-ACETAMINOPHEN 7.5-325 MG PO TABS
1.0000 | ORAL_TABLET | Freq: Four times a day (QID) | ORAL | Status: DC
  Administered 2016-07-19: 1 via ORAL
  Filled 2016-07-19: qty 1

## 2016-07-19 MED ORDER — HYDROMORPHONE HCL 1 MG/ML IJ SOLN
0.2500 mg | INTRAMUSCULAR | Status: DC | PRN
  Administered 2016-07-19 (×2): 0.25 mg via INTRAVENOUS

## 2016-07-19 MED ORDER — SENNA 8.6 MG PO TABS: 1.0000 | ORAL_TABLET | Freq: Two times a day (BID) | ORAL | Status: DC

## 2016-07-19 MED ORDER — DEXAMETHASONE SODIUM PHOSPHATE 10 MG/ML IJ SOLN
10.0000 mg | INTRAMUSCULAR | Status: AC
  Administered 2016-07-19: 10 mg via INTRAVENOUS

## 2016-07-19 MED ORDER — HYDROCODONE-ACETAMINOPHEN 7.5-325 MG PO TABS: 1.0000 | ORAL_TABLET | Freq: Four times a day (QID) | ORAL | 0 refills | Status: DC

## 2016-07-19 MED ORDER — MELOXICAM 7.5 MG PO TABS: 7.5000 mg | ORAL_TABLET | Freq: Every day | ORAL | Status: DC

## 2016-07-19 MED ORDER — METHOCARBAMOL 500 MG PO TABS: 500.0000 mg | ORAL_TABLET | Freq: Four times a day (QID) | ORAL | Status: DC | PRN

## 2016-07-19 MED ORDER — AMLODIPINE BESYLATE 10 MG PO TABS
10.0000 mg | ORAL_TABLET | Freq: Every day | ORAL | Status: DC
  Filled 2016-07-19: qty 1

## 2016-07-19 MED ORDER — THROMBIN 5000 UNITS EX SOLR
CUTANEOUS | Status: DC | PRN
  Administered 2016-07-19 (×2): 5000 [IU] via TOPICAL

## 2016-07-19 MED ORDER — CARVEDILOL 6.25 MG PO TABS
12.5000 mg | ORAL_TABLET | Freq: Two times a day (BID) | ORAL | Status: DC
  Administered 2016-07-19: 12.5 mg via ORAL
  Filled 2016-07-19: qty 2

## 2016-07-19 MED ORDER — MENTHOL 3 MG MT LOZG: 1.0000 | LOZENGE | OROMUCOSAL | Status: DC | PRN

## 2016-07-19 MED ORDER — SUGAMMADEX SODIUM 200 MG/2ML IV SOLN
INTRAVENOUS | Status: DC | PRN
  Administered 2016-07-19: 171.8 mg via INTRAVENOUS

## 2016-07-19 MED ORDER — HEMOSTATIC AGENTS (NO CHARGE) OPTIME
TOPICAL | Status: DC | PRN
Start: 2016-07-19 — End: 2016-07-19
  Administered 2016-07-19: 1 via TOPICAL

## 2016-07-19 MED ORDER — FENTANYL CITRATE (PF) 100 MCG/2ML IJ SOLN
INTRAMUSCULAR | Status: DC | PRN
  Administered 2016-07-19: 50 ug via INTRAVENOUS
  Administered 2016-07-19: 150 ug via INTRAVENOUS
  Administered 2016-07-19 (×2): 25 ug via INTRAVENOUS

## 2016-07-19 MED ORDER — MIDAZOLAM HCL 2 MG/2ML IJ SOLN
INTRAMUSCULAR | Status: AC
  Filled 2016-07-19: qty 2

## 2016-07-19 MED ORDER — DEXAMETHASONE SODIUM PHOSPHATE 10 MG/ML IJ SOLN: INTRAMUSCULAR | Status: DC | PRN

## 2016-07-19 MED ORDER — CEFAZOLIN SODIUM-DEXTROSE 2-4 GM/100ML-% IV SOLN
2.0000 g | INTRAVENOUS | Status: AC
  Administered 2016-07-19: 2 g via INTRAVENOUS

## 2016-07-19 MED ORDER — PHENYLEPHRINE HCL 10 MG/ML IJ SOLN
INTRAVENOUS | Status: DC | PRN
  Administered 2016-07-19: 50 ug/min via INTRAVENOUS

## 2016-07-19 MED ORDER — OXYCODONE HCL 5 MG/5ML PO SOLN: 5.0000 mg | Freq: Once | ORAL | Status: DC | PRN

## 2016-07-19 MED ORDER — LACTATED RINGERS IV SOLN
INTRAVENOUS | Status: DC | PRN
  Administered 2016-07-19 (×2): via INTRAVENOUS

## 2016-07-19 MED ORDER — LIDOCAINE HCL (CARDIAC) 20 MG/ML IV SOLN
INTRAVENOUS | Status: DC | PRN
  Administered 2016-07-19: 60 mg via INTRAVENOUS

## 2016-07-19 MED ORDER — ROCURONIUM BROMIDE 100 MG/10ML IV SOLN
INTRAVENOUS | Status: DC | PRN
  Administered 2016-07-19: 50 mg via INTRAVENOUS

## 2016-07-19 MED ORDER — PROPOFOL 10 MG/ML IV BOLUS
INTRAVENOUS | Status: DC | PRN
  Administered 2016-07-19: 100 mg via INTRAVENOUS
  Administered 2016-07-19 (×2): 20 mg via INTRAVENOUS

## 2016-07-19 MED ORDER — CEFAZOLIN SODIUM-DEXTROSE 2-4 GM/100ML-% IV SOLN
INTRAVENOUS | Status: AC
  Filled 2016-07-19: qty 100

## 2016-07-19 MED ORDER — BUPIVACAINE HCL (PF) 0.25 % IJ SOLN
INTRAMUSCULAR | Status: AC
  Filled 2016-07-19: qty 30

## 2016-07-19 SURGICAL SUPPLY — 51 items
BAG DECANTER FOR FLEXI CONT (MISCELLANEOUS) ×3 IMPLANT
BENZOIN TINCTURE PRP APPL 2/3 (GAUZE/BANDAGES/DRESSINGS) ×3 IMPLANT
BUR MATCHSTICK NEURO 3.0 LAGG (BURR) ×3 IMPLANT
CANISTER SUCT 3000ML PPV (MISCELLANEOUS) ×3 IMPLANT
CARTRIDGE OIL MAESTRO DRILL (MISCELLANEOUS) ×1 IMPLANT
CLOSURE WOUND 1/2 X4 (GAUZE/BANDAGES/DRESSINGS) ×1
DERMABOND ADVANCED (GAUZE/BANDAGES/DRESSINGS) ×2
DERMABOND ADVANCED .7 DNX12 (GAUZE/BANDAGES/DRESSINGS) ×1 IMPLANT
DIFFUSER DRILL AIR PNEUMATIC (MISCELLANEOUS) ×3 IMPLANT
DRAPE LAPAROTOMY 100X72X124 (DRAPES) ×3 IMPLANT
DRAPE MICROSCOPE LEICA (MISCELLANEOUS) IMPLANT
DRAPE POUCH INSTRU U-SHP 10X18 (DRAPES) ×3 IMPLANT
DRAPE SURG 17X23 STRL (DRAPES) ×3 IMPLANT
DRSG OPSITE POSTOP 4X6 (GAUZE/BANDAGES/DRESSINGS) ×6 IMPLANT
DURAPREP 26ML APPLICATOR (WOUND CARE) ×3 IMPLANT
ELECT REM PT RETURN 9FT ADLT (ELECTROSURGICAL) ×3
ELECTRODE REM PT RTRN 9FT ADLT (ELECTROSURGICAL) ×1 IMPLANT
GAUZE SPONGE 4X4 16PLY XRAY LF (GAUZE/BANDAGES/DRESSINGS) IMPLANT
GLOVE BIO SURGEON STRL SZ7 (GLOVE) IMPLANT
GLOVE BIO SURGEON STRL SZ8 (GLOVE) ×3 IMPLANT
GLOVE BIOGEL PI IND STRL 7.0 (GLOVE) ×2 IMPLANT
GLOVE BIOGEL PI IND STRL 7.5 (GLOVE) ×3 IMPLANT
GLOVE BIOGEL PI INDICATOR 7.0 (GLOVE) ×4
GLOVE BIOGEL PI INDICATOR 7.5 (GLOVE) ×6
GLOVE ECLIPSE 7.0 STRL STRAW (GLOVE) ×3 IMPLANT
GLOVE SS BIOGEL STRL SZ 7 (GLOVE) ×2 IMPLANT
GLOVE SUPERSENSE BIOGEL SZ 7 (GLOVE) ×4
GOWN STRL REUS W/ TWL LRG LVL3 (GOWN DISPOSABLE) ×2 IMPLANT
GOWN STRL REUS W/ TWL XL LVL3 (GOWN DISPOSABLE) ×1 IMPLANT
GOWN STRL REUS W/TWL 2XL LVL3 (GOWN DISPOSABLE) IMPLANT
GOWN STRL REUS W/TWL LRG LVL3 (GOWN DISPOSABLE) ×4
GOWN STRL REUS W/TWL XL LVL3 (GOWN DISPOSABLE) ×2
HEMOSTAT POWDER KIT SURGIFOAM (HEMOSTASIS) IMPLANT
KIT BASIN OR (CUSTOM PROCEDURE TRAY) ×3 IMPLANT
KIT ROOM TURNOVER OR (KITS) ×3 IMPLANT
NEEDLE HYPO 25X1 1.5 SAFETY (NEEDLE) ×3 IMPLANT
NEEDLE SPNL 20GX3.5 QUINCKE YW (NEEDLE) IMPLANT
NS IRRIG 1000ML POUR BTL (IV SOLUTION) ×3 IMPLANT
OIL CARTRIDGE MAESTRO DRILL (MISCELLANEOUS) ×3
PACK LAMINECTOMY NEURO (CUSTOM PROCEDURE TRAY) ×3 IMPLANT
PAD ARMBOARD 7.5X6 YLW CONV (MISCELLANEOUS) ×9 IMPLANT
RUBBERBAND STERILE (MISCELLANEOUS) IMPLANT
SPONGE SURGIFOAM ABS GEL SZ50 (HEMOSTASIS) ×3 IMPLANT
STRIP CLOSURE SKIN 1/2X4 (GAUZE/BANDAGES/DRESSINGS) ×2 IMPLANT
SUT VIC AB 0 CT1 18XCR BRD8 (SUTURE) ×1 IMPLANT
SUT VIC AB 0 CT1 8-18 (SUTURE) ×2
SUT VIC AB 2-0 CP2 18 (SUTURE) ×3 IMPLANT
SUT VIC AB 3-0 SH 8-18 (SUTURE) ×3 IMPLANT
TOWEL GREEN STERILE (TOWEL DISPOSABLE) ×3 IMPLANT
TOWEL GREEN STERILE FF (TOWEL DISPOSABLE) ×2 IMPLANT
WATER STERILE IRR 1000ML POUR (IV SOLUTION) ×3 IMPLANT

## 2016-07-19 NOTE — Op Note (Signed)
07/19/2016  1:37 PM  PATIENT:  Ricardo Hunt  76 y.o. male  PRE-OPERATIVE DIAGNOSIS:  Lumbar spinal stenosis L2-3, L4-5  POST-OPERATIVE DIAGNOSIS:  same  PROCEDURE:  Decompressive lumbar laminectomy medial facetectomy and foraminotomies L2-3 bilaterally and right L4-5 decompressive hemilaminectomy with medial facetectomy and foraminotomy  SURGEON:  Sherley Bounds, MD  ASSISTANTS: Dr Kathyrn Sheriff  ANESTHESIA:   General  EBL: 50 ml  Total I/O In: 1500 [I.V.:1500] Out: 50 [Blood:50]  BLOOD ADMINISTERED: none  DRAINS: none  SPECIMEN:  none  INDICATION FOR PROCEDURE: This patient presented with back and leg pain. Imaging showedstenosis. The patient tried conservative measures without relief. Pain was debilitating. Recommended LL L2-3, L4-5. Patient understood the risks, benefits, and alternatives and potential outcomes and wished to proceed.  PROCEDURE DETAILS: The patient was taken to the operating room and after induction of adequate generalized endotracheal anesthesia, the patient was rolled into the prone position on the Wilson frame and all pressure points were padded. The lumbar region was cleaned and then prepped with DuraPrep and draped in the usual sterile fashion. 5 cc of local anesthesia was injected and then a dorsal midline incision was made and carried down to the lumbo sacral fascia. The fascia was opened and the paraspinous musculature was taken down in a subperiosteal fashion to expose L2-3 bilaterally and L4-5 on the right. Intraoperative x-ray confirmed my level, and then I used a combination of the high-speed drill and the Kerrison punches to perform a hemilaminectomy, medial facetectomy, and foraminotomy at L4-5 on the right. The inferior part of the spinous process of L2 was removed and a laminectomy and medial facetectomy and foraminotomy was performed at L2-3 laterally The underlying yellow ligament was opened and removed in a piecemeal fashion to expose the underlying  dura and exiting nerve root. I undercut the lateral recess and dissected down until I was medial to and distal to the pedicle. The nerve root was well decompressed at both levels. We then gently retracted the nerve root medially with a retractor, coagulated the epidural venous vasculature, and inspected the disc space. I found only annular bulging. I then palpated with a coronary dilator along the nerve root and into the foramen to assure adequate decompression. I felt no more compression of the nerve roots at each level. I irrigated with saline solution containing bacitracin. Achieved hemostasis with bipolar cautery, lined the dura with Gelfoam, and then closed the fascia with 0 Vicryl. I closed the subcutaneous tissues with 2-0 Vicryl and the subcuticular tissues with 3-0 Vicryl. The skin was then closed with benzoin and Steri-Strips. The drapes were removed, a sterile dressing was applied. The patient was awakened from general anesthesia and transferred to the recovery room in stable condition. At the end of the procedure all sponge, needle and instrument counts were correct.    PLAN OF CARE: Admit for overnight observation  PATIENT DISPOSITION:  PACU - hemodynamically stable.   Delay start of Pharmacological VTE agent (>24hrs) due to surgical blood loss or risk of bleeding:  yes

## 2016-07-19 NOTE — Progress Notes (Signed)
Windle Guard notified of time of pt. Surgery,he will be here before surgery.

## 2016-07-19 NOTE — Anesthesia Procedure Notes (Signed)
Arterial Line Insertion Start/End4/19/2018 11:00 AM, 07/19/2016 11:15 AM Performed by: Eligha Bridegroom, CRNA  Patient location: OOR procedure area. Preanesthetic checklist: patient identified, surgical consent and pre-op evaluation Lidocaine 1% used for infiltration radial was placed Catheter size: 20 G Hand hygiene performed  and maximum sterile barriers used   Attempts: 1 Procedure performed without using ultrasound guided technique. Following insertion, dressing applied and Biopatch. Post procedure assessment: normal  Patient tolerated the procedure well with no immediate complications.

## 2016-07-19 NOTE — Anesthesia Preprocedure Evaluation (Signed)
Anesthesia Evaluation  Patient identified by MRN, date of birth, ID band Patient awake    Reviewed: Allergy & Precautions, NPO status , Patient's Chart, lab work & pertinent test results  History of Anesthesia Complications Negative for: history of anesthetic complications  Airway Mallampati: II  TM Distance: >3 FB Neck ROM: Full    Dental  (+) Missing   Pulmonary former smoker,    breath sounds clear to auscultation       Cardiovascular hypertension, + CAD, + Past MI and +CHF  + dysrhythmias + pacemaker  Rhythm:Regular     Neuro/Psych  Neuromuscular disease negative psych ROS   GI/Hepatic Neg liver ROS, GERD  Controlled,  Endo/Other  negative endocrine ROS  Renal/GU      Musculoskeletal  (+) Arthritis ,   Abdominal   Peds  Hematology negative hematology ROS (+)   Anesthesia Other Findings   Reproductive/Obstetrics                             Anesthesia Physical Anesthesia Plan  ASA: III  Anesthesia Plan: General   Post-op Pain Management:    Induction: Intravenous  Airway Management Planned: Oral ETT  Additional Equipment: Arterial line  Intra-op Plan:   Post-operative Plan: Extubation in OR  Informed Consent: I have reviewed the patients History and Physical, chart, labs and discussed the procedure including the risks, benefits and alternatives for the proposed anesthesia with the patient or authorized representative who has indicated his/her understanding and acceptance.   Dental advisory given  Plan Discussed with: CRNA and Surgeon  Anesthesia Plan Comments:         Anesthesia Quick Evaluation

## 2016-07-19 NOTE — Anesthesia Procedure Notes (Signed)
Procedure Name: Intubation Date/Time: 07/19/2016 12:00 PM Performed by: Eligha Bridegroom Pre-anesthesia Checklist: Emergency Drugs available, Patient identified, Suction available, Timeout performed and Patient being monitored Patient Re-evaluated:Patient Re-evaluated prior to inductionOxygen Delivery Method: Circle system utilized Preoxygenation: Pre-oxygenation with 100% oxygen Intubation Type: IV induction Ventilation: Mask ventilation without difficulty Laryngoscope Size: Mac and 4 Grade View: Grade I Tube type: Oral Tube size: 7.5 mm Number of attempts: 1 Airway Equipment and Method: Stylet Placement Confirmation: ETT inserted through vocal cords under direct vision and breath sounds checked- equal and bilateral Secured at: 22 cm Tube secured with: Tape Dental Injury: Teeth and Oropharynx as per pre-operative assessment

## 2016-07-19 NOTE — Discharge Summary (Signed)
Physician Discharge Summary  Patient ID: Ricardo Hunt MRN: [id number] DOB/AGE: 04/04/1940 76 y.o.  Admit date: 07/19/2016 Discharge date: 07/19/2016  Admission Diagnoses: lumbar stenosis    Discharge Diagnoses: same   Discharged Condition: good  Hospital Course: The patient was admitted on 07/19/2016 and taken to the operating room where the patient underwent LL for stenosis. The patient tolerated the procedure well and was taken to the recovery room and then to the floor in stable condition. The hospital course was routine. There were no complications. The wound remained clean dry and intact. Pt had appropriate back soreness. No complaints of leg pain or new N/T/W. The patient remained afebrile with stable vital signs, and tolerated a regular diet. The patient continued to increase activities, and pain was well controlled with oral pain medications.   Consults: None  Significant Diagnostic Studies:  Results for orders placed or performed during the hospital encounter of 07/11/16  Surgical pcr screen  Result Value Ref Range   MRSA, PCR POSITIVE (A) NEGATIVE   Staphylococcus aureus POSITIVE (A) NEGATIVE  Basic metabolic panel  Result Value Ref Range   Sodium 142 135 - 145 mmol/L   Potassium 4.3 3.5 - 5.1 mmol/L   Chloride 103 101 - 111 mmol/L   CO2 28 22 - 32 mmol/L   Glucose, Bld 104 (H) 65 - 99 mg/dL   BUN 24 (H) 6 - 20 mg/dL   Creatinine, Ser 1.06 0.61 - 1.24 mg/dL   Calcium 9.9 8.9 - 10.3 mg/dL   GFR calc non Af Amer >60 >60 mL/min   GFR calc Af Amer >60 >60 mL/min   Anion gap 11 5 - 15  CBC WITH DIFFERENTIAL  Result Value Ref Range   WBC 7.5 4.0 - 10.5 K/uL   RBC 4.77 4.22 - 5.81 MIL/uL   Hemoglobin 14.7 13.0 - 17.0 g/dL   HCT 45.1 39.0 - 52.0 %   MCV 94.5 78.0 - 100.0 fL   MCH 30.8 26.0 - 34.0 pg   MCHC 32.6 30.0 - 36.0 g/dL   RDW 14.6 11.5 - 15.5 %   Platelets 175 150 - 400 K/uL   Neutrophils Relative % 67 %   Neutro Abs 5.1 1.7 - 7.7 K/uL   Lymphocytes  Relative 18 %   Lymphs Abs 1.3 0.7 - 4.0 K/uL   Monocytes Relative 7 %   Monocytes Absolute 0.5 0.1 - 1.0 K/uL   Eosinophils Relative 7 %   Eosinophils Absolute 0.5 0.0 - 0.7 K/uL   Basophils Relative 1 %   Basophils Absolute 0.0 0.0 - 0.1 K/uL  Protime-INR  Result Value Ref Range   Prothrombin Time 14.3 11.4 - 15.2 seconds   INR 1.10     Chest 2 View  Result Date: 07/11/2016 CLINICAL DATA:  Preoperative examination prior back surgery. Patient has a permanent pacemaker. History of CABG and previous MI. Former heavy smoker discontinued 22 years ago. EXAM: CHEST  2 VIEW COMPARISON:  Chest x-ray of October 17, 2012 FINDINGS: The lungs are adequately inflated. The interstitial markings are chronically increased bilaterally. The cardiac silhouette is mildly enlarged. The central pulmonary vascularity is prominent. Patient has undergone previous CABG. The ICD is in stable position. There is calcification in the wall of the aortic arch. There is no pleural effusion. The bony thorax exhibits no acute abnormality. IMPRESSION: Chronic bronchitic -smoking related changes. Mild cardiomegaly with central pulmonary vascular congestion slightly more conspicuous than on the previous studies. This suggests low-grade CHF. The ICD is  in stable position. Thoracic aortic atherosclerosis. Electronically Signed   By: Shyhiem Beeney  [name] M.D.   On: 07/11/2016 14:03   Dg Lumbar Spine 1 View  Result Date: 07/19/2016 CLINICAL DATA:  L4-L5 laminectomy and L2-L3 laminectomy EXAM: LUMBAR SPINE - 1 VIEW COMPARISON:  07/19/2016 FINDINGS: Single lateral view of the lumbar spine submitted. There is a posterior localization instrument at L3 level with tip about 5 mm inferior lower endplate of L3. There is a second posterior localization instrument at the level of L4-L5 disc space. IMPRESSION: There is a posterior localization instrument at L3 level with tip about 5 mm inferior lower endplate of L3. There is a second posterior localization  instrument at the level of L4-L5 disc space. Electronically Signed   By: Lahoma Crocker M.D.   On: 07/19/2016 14:46   Dg Lumbar Spine 1 View  Result Date: 07/19/2016 CLINICAL DATA:  76 year old male undergoing lumbar surgery. EXAM: LUMBAR SPINE - 1 VIEW COMPARISON:  CT lumbar myelogram 06/06/2016. FINDINGS: Intraoperative portable cross-table lateral view of the lumbar spine at 1226 hours. Normal lumbar segmentation. A posterior surgical probe is present at the L4 inferior vertebral body/inferior articulating facet level. There is a surgical clamp projecting obliquely over the L2 inferior spinous process, the tip of which is at the L2-L3 disc space level. Calcified aortic atherosclerosis. IMPRESSION: Intraoperative localization as above. Electronically Signed   By: Genevie Ann M.D.   On: 07/19/2016 13:49    Antibiotics:  Anti-infectives    Start     Dose/Rate Route Frequency Ordered Stop   07/19/16 2000  ceFAZolin (ANCEF) IVPB 2g/100 mL premix     2 g 200 mL/hr over 30 Minutes Intravenous Every 8 hours 07/19/16 1541 07/20/16 1159   07/19/16 1245  bacitracin 50,000 Units in sodium chloride irrigation 0.9 % 500 mL irrigation  Status:  Discontinued       As needed 07/19/16 1246 07/19/16 1348   07/19/16 1015  ceFAZolin (ANCEF) IVPB 2g/100 mL premix     2 g 200 mL/hr over 30 Minutes Intravenous On call to O.R. 07/19/16 0852 07/19/16 1230   07/19/16 0847  ceFAZolin (ANCEF) 2-4 GM/100ML-% IVPB    Comments:  Leandrew Koyanagi   : cabinet override      07/19/16 0847 07/19/16 1200      Discharge Exam: Blood pressure 135/82, pulse 65, temperature 97.8 F (36.6 C), resp. rate 18, SpO2 95 %. Neurologic: Grossly normal Dressing dry  Discharge Medications:   Allergies as of 07/19/2016      Reactions   No Known Allergies       Medication List    TAKE these medications   acetaminophen 500 MG tablet Commonly known as:  TYLENOL Take 1,000 mg by mouth every 6 (six) hours as needed (for leg pain/pain).    ACID REDUCER PO Take 1 tablet by mouth daily as needed (for heartburn/indigestion).   amLODipine 10 MG tablet Commonly known as:  NORVASC Take 1 tablet (10 mg total) by mouth at bedtime.   aspirin EC 81 MG tablet Take 81 mg by mouth daily.   carvedilol 12.5 MG tablet Commonly known as:  COREG TAKE 1 TABLET BY MOUTH TWO  TIMES DAILY   Fish Oil 1000 MG Caps Take 2,000 mg by mouth daily.   furosemide 20 MG tablet Commonly known as:  LASIX Take 1 tablet (20 mg total) by mouth daily. What changed:  additional instructions   hydrALAZINE 50 MG tablet Commonly known as:  APRESOLINE Take 25 mg  by mouth 2 (two) times daily.   HYDROcodone-acetaminophen 7.5-325 MG tablet Commonly known as:  NORCO Take 1 tablet by mouth every 6 (six) hours. Start taking on:  07/20/2016   meloxicam 7.5 MG tablet Commonly known as:  MOBIC Take 7.5 mg by mouth daily as needed for pain.   rosuvastatin 20 MG tablet Commonly known as:  CRESTOR Take 20 mg by mouth at bedtime.   SLEEP AID PO Take 1 tablet by mouth at bedtime as needed (for sleep).       Disposition: home   Final Dx: LL for stenosis  Discharge Instructions     Remove dressing in 72 hours    Complete by:  As directed    Call MD for:  difficulty breathing, headache or visual disturbances    Complete by:  As directed    Call MD for:  persistant nausea and vomiting    Complete by:  As directed    Call MD for:  redness, tenderness, or signs of infection (pain, swelling, redness, odor or green/yellow discharge around incision site)    Complete by:  As directed    Call MD for:  severe uncontrolled pain    Complete by:  As directed    Call MD for:  temperature >100.4    Complete by:  As directed    Diet - low sodium heart healthy    Complete by:  As directed    Increase activity slowly    Complete by:  As directed          Signed: Saban Heinlen S 07/19/2016, 5:17 PM

## 2016-07-19 NOTE — Discharge Instructions (Signed)
Wound Care Keep incision covered and dry for one week.  If you shower prior to then, cover incision with plastic wrap.  You may remove outer bandage after one week and shower.  Do not put any creams, lotions, or ointments on incision. Leave steri-strips on neck.  They will fall off by themselves. Activity Walk each and every day, increasing distance each day. No lifting greater than 5 lbs.  Avoid bending, arching, or twisting. No driving for 2 weeks; may ride as a passenger locally. If provided with back brace, wear when out of bed.  It is not necessary to wear in bed. Diet Resume your normal diet.  Return to Work Will be discussed at you follow up appointment. Call Your Doctor If Any of These Occur Redness, drainage, or swelling at the wound.  Temperature greater than 101 degrees. Severe pain not relieved by pain medication. Incision starts to come apart. Follow Up Appt Call today for appointment in 2 weeks ([phone]) or for problems.  If you have any hardware placed in your spine, you will need an x-ray before your appointment.

## 2016-07-19 NOTE — Progress Notes (Signed)
[employer] (ST. Jude Rep)  Paged.  Waiting for response.

## 2016-07-19 NOTE — Transfer of Care (Signed)
22Immediate Anesthesia Transfer of Care Note  Patient: Ricardo Hunt  Procedure(s) Performed: Procedure(s) with comments: Right Lumbar Four-Five Hemilaminectomy and Bilateral Lumbar Two-Three Laminectomy (Right) - Right Lumbar Four-Five Hemilaminectomy and Bilateral Lumbar Two-Three Laminectomy  Patient Location: PACU  Anesthesia Type:General  Level of Consciousness: awake and alert   Airway & Oxygen Therapy: Patient Spontanous Breathing and Patient connected to nasal cannula oxygen  Post-op Assessment: Report given to RN and Post -op Vital signs reviewed and stable  Post vital signs: Reviewed and stable  Last Vitals:  Vitals:   07/19/16 0844  BP: (!) 131/99  Pulse: (!) 56  Resp: 18  Temp: 36.6 C    Last Pain:  Vitals:   07/19/16 0920  TempSrc:   PainSc: 6          Complications: No apparent anesthesia complications

## 2016-07-19 NOTE — Progress Notes (Signed)
Pt doing well. Pt and daughter given D/C instructions with Rx, verbal understanding was provided. Pt's incision has a scant amount of drainage. Pt's IV was removed prior to D/C. Pt D/C'd home via wheelchair @ 1830 per MD order. Pt is stable @ D/C and has no other needs at this time. Holli Humbles, RN

## 2016-07-19 NOTE — H&P (Signed)
Subjective: Patient is a 76 y.o. male admitted for lumbar stenosis. Onset of symptoms was several months ago, gradually worsening since that time.  The pain is rated severe, and is located at the across the lower back and radiates to legs. The pain is described as aching and occurs all day. The symptoms have been progressive. Symptoms are exacerbated by exercise. MRI or CT showed stenosis   Past Medical History:  Diagnosis Date  . Arthritis    " all over the body"  . Coronary artery disease   . GERD (gastroesophageal reflux disease)   . Hyperlipidemia   . Hypertension   . Mobitz (type) II atrioventricular block    s/p STJ Accent pacemaker implanted by Dr [name] 10-16-2012  . Myocardial infarction [facility])    per pt., told that that the stress test shows that there is a weakening evident on the stress test  . Pacemaker 10/16/2012   St. Jude   . Personal history of colonic polyps-tubular adenomas 08/31/2013  . Vertigo     Past Surgical History:  Procedure Laterality Date  . cardiac bypass  1996   3 vessels  . CATARACT EXTRACTION W/ INTRAOCULAR LENS  IMPLANT, BILATERAL Bilateral 2010  . COLONOSCOPY    . CORONARY ARTERY BYPASS GRAFT    . INGUINAL HERNIA REPAIR Left 2010  . LUMBAR SPINE SURGERY  2006  . NASAL SEPTUM SURGERY  09/01/13   Dr. Adriana Reams  . PACEMAKER INSERTION  10-16-2012   STJ Accent dual chamber pacemaker implanted by Dr [name] for symptomatic Mobitz II heart block  . PERMANENT PACEMAKER INSERTION N/A 10/16/2012   Procedure: PERMANENT PACEMAKER INSERTION;  Surgeon: Thompson Grayer, MD;  Location: [facility] CATH LAB;  Service: Cardiovascular;  Laterality: N/A;    Prior to Admission medications   Medication Sig Start Date End Date Taking? Authorizing Provider  acetaminophen (TYLENOL) 500 MG tablet Take 1,000 mg by mouth every 6 (six) hours as needed (for leg pain/pain).   Yes Historical Provider, MD  amLODipine (NORVASC) 10 MG tablet Take 1 tablet (10 mg total) by mouth at bedtime.  10/06/12  Yes Domenic Polite, MD  aspirin EC 81 MG tablet Take 81 mg by mouth daily.   Yes Historical Provider, MD  carvedilol (COREG) 12.5 MG tablet TAKE 1 TABLET BY MOUTH TWO  TIMES DAILY 05/09/16  Yes Herminio Commons, MD  Doxylamine Succinate, Sleep, (SLEEP AID PO) Take 1 tablet by mouth at bedtime as needed (for sleep).   Yes Historical Provider, MD  furosemide (LASIX) 20 MG tablet Take 1 tablet (20 mg total) by mouth daily. Patient taking differently: Take 20 mg by mouth daily. As of appt. On 06/28/2016 with cardiology- told to take q day 05/18/16  Yes Herminio Commons, MD  hydrALAZINE (APRESOLINE) 50 MG tablet Take 25 mg by mouth 2 (two) times daily.   Yes Historical Provider, MD  meloxicam (MOBIC) 7.5 MG tablet Take 7.5 mg by mouth daily as needed for pain.  03/27/16 03/27/17 Yes Historical Provider, MD  Omega-3 Fatty Acids (FISH OIL) 1000 MG CAPS Take 2,000 mg by mouth daily.    Yes Historical Provider, MD  RaNITidine HCl (ACID REDUCER PO) Take 1 tablet by mouth daily as needed (for heartburn/indigestion).   Yes Historical Provider, MD  rosuvastatin (CRESTOR) 20 MG tablet Take 20 mg by mouth at bedtime.  12/12/15 12/11/16 Yes Historical Provider, MD   Allergies  Allergen Reactions  . No Known Allergies     Social History  Substance Use  Topics  . Smoking status: Former Smoker    Packs/day: 2.00    Years: 40.00    Types: Cigarettes    Quit date: 04/02/1994  . Smokeless tobacco: Never Used  . Alcohol use No     Comment: rare 01/01/14 occasionally- A    Family History  Problem Relation Age of Onset  . CAD Father   . Hypertension Father   . CAD Mother   . Diabetes Mother   . Breast cancer      Niece  . Colon cancer Neg Hx   . Pancreatic cancer Neg Hx   . Stomach cancer Neg Hx      Review of Systems  Positive ROS: neg  All other systems have been reviewed and were otherwise negative with the exception of those mentioned in the HPI and as above.  Objective: Vital signs in  last 24 hours:    General Appearance: Alert, cooperative, no distress, appears stated age Head: Normocephalic, without obvious abnormality, atraumatic Eyes: PERRL, conjunctiva/corneas clear, EOM's intact    Neck: Supple, symmetrical, trachea midline Back: Symmetric, no curvature, ROM normal, no CVA tenderness Lungs:  respirations unlabored Heart: Regular rate and rhythm Abdomen: Soft, non-tender Extremities: Extremities normal, atraumatic, no cyanosis or edema Pulses: 2+ and symmetric all extremities Skin: Skin color, texture, turgor normal, no rashes or lesions  NEUROLOGIC:   Mental status: Alert and oriented x4,  no aphasia, good attention span, fund of knowledge, and memory Motor Exam - grossly normal Sensory Exam - grossly normal Reflexes: trace Coordination - grossly normal Gait - grossly normal Balance - grossly normal Cranial Nerves: I: smell Not tested  II: visual acuity  OS: nl    OD: nl  II: visual fields Full to confrontation  II: pupils Equal, round, reactive to light  III,VII: ptosis None  III,IV,VI: extraocular muscles  Full ROM  V: mastication Normal  V: facial light touch sensation  Normal  V,VII: corneal reflex  Present  VII: facial muscle function - upper  Normal  VII: facial muscle function - lower Normal  VIII: hearing Not tested  IX: soft palate elevation  Normal  IX,X: gag reflex Present  XI: trapezius strength  5/5  XI: sternocleidomastoid strength 5/5  XI: neck flexion strength  5/5  XII: tongue strength  Normal    Data Review Lab Results  Component Value Date   WBC 7.5 07/11/2016   HGB 14.7 07/11/2016   HCT 45.1 07/11/2016   MCV 94.5 07/11/2016   PLT 175 07/11/2016   Lab Results  Component Value Date   NA 142 07/11/2016   K 4.3 07/11/2016   CL 103 07/11/2016   CO2 28 07/11/2016   BUN 24 (H) 07/11/2016   CREATININE 1.06 07/11/2016   GLUCOSE 104 (H) 07/11/2016   Lab Results  Component Value Date   INR 1.10 07/11/2016     Assessment/Plan: Patient admitted for LL for stenosis. Patient has failed a reasonable attempt at conservative therapy.  I explained the condition and procedure to the patient and answered any questions.  Patient wishes to proceed with procedure as planned. Understands risks/ benefits and typical outcomes of procedure.   JONES,DAVID S 07/19/2016 6:19 AM

## 2016-07-20 ENCOUNTER — Encounter (HOSPITAL_COMMUNITY): Payer: Self-pay | Admitting: Neurological Surgery

## 2016-07-23 ENCOUNTER — Encounter (HOSPITAL_COMMUNITY): Payer: Self-pay | Admitting: Neurological Surgery

## 2016-07-23 NOTE — Anesthesia Postprocedure Evaluation (Addendum)
Anesthesia Post Note  Patient: Ricardo Hunt  Procedure(s) Performed: Procedure(s) (LRB): Right Lumbar Four-Five Hemilaminectomy and Bilateral Lumbar Two-Three Laminectomy (Right)  Patient location during evaluation: PACU Anesthesia Type: General Level of consciousness: awake and alert Pain management: pain level controlled Vital Signs Assessment: post-procedure vital signs reviewed and stable Respiratory status: spontaneous breathing, nonlabored ventilation, respiratory function stable and patient connected to nasal cannula oxygen Cardiovascular status: blood pressure returned to baseline and stable Postop Assessment: no signs of nausea or vomiting Anesthetic complications: no       Last Vitals:  Vitals:   07/19/16 1518 07/19/16 1542  BP: 132/80 135/82  Pulse: 79 65  Resp: 12 18  Temp:  36.6 C    Last Pain:  Vitals:   07/19/16 1800  TempSrc:   PainSc: 3                  Zadaya Cuadra

## 2016-08-06 ENCOUNTER — Encounter: Payer: Medicare Other | Admitting: *Deleted

## 2016-08-06 ENCOUNTER — Telehealth: Payer: Self-pay | Admitting: Cardiology

## 2016-08-06 NOTE — Telephone Encounter (Signed)
LMOVM reminding pt to send remote transmission.   

## 2016-08-10 ENCOUNTER — Encounter: Payer: Self-pay | Admitting: Cardiology

## 2016-08-17 ENCOUNTER — Other Ambulatory Visit: Payer: Self-pay | Admitting: Internal Medicine

## 2016-08-17 NOTE — Telephone Encounter (Signed)
furosemide (LASIX) 20 MG tablet  Medication  Date: 05/18/2016 Department: [facility] Ordering/Authorizing: Herminio Commons, MD  Order Providers   Prescribing Provider Encounter Provider  Herminio Commons, MD Herminio Commons, MD  Medication Detail    Disp Refills Start End   furosemide (LASIX) 20 MG tablet 30 tablet 3 05/18/2016    Sig - Route: Take 1 tablet (20 mg total) by mouth daily. - Oral   Patient taking differently: Take 20 mg by mouth daily. As of appt. On 06/28/2016 with cardiology- told to take q day       Notes to Pharmacy: DOSE CHANGE 05/18/16   E-Prescribing Status: Receipt confirmed by pharmacy (05/18/2016 10:54 AM EST)    THE 06/28/16 APPT WAS WITH DR Bronson Ing, WILL ROUTE TO [place]

## 2016-08-21 ENCOUNTER — Telehealth: Payer: Self-pay | Admitting: Cardiovascular Disease

## 2016-08-21 DIAGNOSIS — I10 Essential (primary) hypertension: Secondary | ICD-10-CM

## 2016-08-21 NOTE — Telephone Encounter (Signed)
Swelling has been going on for about a week now. Swelling is in ankles and feet. Patient is having shortness of breath off and on. Currently taking lasix 20mg  once daily. Patient stated this does not seem to be helping the swelling anymore.

## 2016-08-21 NOTE — Telephone Encounter (Signed)
Patients daughter verbalized understanding Orders for BMET faxed to PCP

## 2016-08-21 NOTE — Telephone Encounter (Signed)
Increase to 40 mg daily. Check BMET in 2 days.

## 2016-08-21 NOTE — Telephone Encounter (Signed)
Feet and ankles swelling worse than normal since his back surgery. Wanted to know if he needs to take more lasix

## 2016-09-03 NOTE — Addendum Note (Signed)
Addendum  created 09/03/16 1408 by Oleta Mouse, MD   Sign clinical note

## 2016-09-11 ENCOUNTER — Telehealth: Payer: Self-pay | Admitting: Cardiovascular Disease

## 2016-09-11 DIAGNOSIS — I10 Essential (primary) hypertension: Secondary | ICD-10-CM

## 2016-09-11 DIAGNOSIS — R609 Edema, unspecified: Secondary | ICD-10-CM

## 2016-09-11 NOTE — Telephone Encounter (Signed)
Increase to 40 mg bid x 3 days, then reduce to 40 mg daily. Check BMET in 2 days.

## 2016-09-11 NOTE — Telephone Encounter (Signed)
Patients daughter states he did increase to 40 mg back in May and it did seem to help

## 2016-09-11 NOTE — Telephone Encounter (Signed)
Patient is still having swelling in legs and feet and having pain as well. Shortness of breath has continued. Patients daughter states that she is unsure if shortness of breath is worse when she ask patient about it, he states he is having trouble catching his breath. Patient still taking lasix 20mg  daily

## 2016-09-11 NOTE — Telephone Encounter (Signed)
Did he increase to 40 mg for 2 days in May when there were first complaints of ankle and feet swelling? Did he find an improvement in symptoms when Lasix was increased at that time?

## 2016-09-11 NOTE — Telephone Encounter (Signed)
Patient still complaining about not being able to breathe.   No other symptoms per daughter

## 2016-09-12 MED ORDER — FUROSEMIDE 40 MG PO TABS: 40.0000 mg | ORAL_TABLET | Freq: Every day | ORAL | 6 refills | Status: DC

## 2016-09-12 NOTE — Telephone Encounter (Signed)
Lattie Haw (daughter) notified.  Will send new 40mg  Lasix to [facility] now.  She will take him to PMD (Dr. Edrick Oh) for his labs.  Order faxed to their office today.

## 2016-09-24 ENCOUNTER — Telehealth: Payer: Self-pay | Admitting: *Deleted

## 2016-09-24 NOTE — Telephone Encounter (Signed)
Please check BMET done at Dr. Murrell Redden office.  Requested per phone note dated 09/11/2016.  See care everywhere for results.

## 2016-09-24 NOTE — Telephone Encounter (Signed)
Resulted

## 2016-09-25 NOTE — Telephone Encounter (Signed)
Notes recorded by Laurine Blazer, LPN on 6/76/1952 at 93:26 AM EDT Left message to return call.  ------  Notes recorded by Herminio Commons, MD on 09/24/2016 at 3:37 PM EDT BUN only mildly elevated reflective of increased Lasix dose for 3 days. Overall renal function is stable.

## 2016-09-27 NOTE — Telephone Encounter (Signed)
Notes recorded by Laurine Blazer, LPN on 9/81/0254 at 8:62 PM EDT Daughter Lattie Haw) notified. Copy to pmd. Follow up scheduled for September.

## 2016-10-16 ENCOUNTER — Ambulatory Visit (HOSPITAL_COMMUNITY)
Admission: RE | Admit: 2016-10-16 | Discharge: 2016-10-16 | Disposition: A | Discharge status: Home / Self Care | Payer: Medicare Other | Source: Ambulatory Visit | Attending: Cardiovascular Disease | Admitting: Cardiovascular Disease

## 2016-10-16 ENCOUNTER — Ambulatory Visit (INDEPENDENT_AMBULATORY_CARE_PROVIDER_SITE_OTHER): Payer: Medicare Other | Admitting: Cardiovascular Disease

## 2016-10-16 ENCOUNTER — Encounter: Payer: Self-pay | Admitting: Cardiovascular Disease

## 2016-10-16 VITALS — BP 124/70 | HR 70 | Ht 67.0 in | Wt 203.0 lb

## 2016-10-16 DIAGNOSIS — E8770 Fluid overload, unspecified: Secondary | ICD-10-CM | POA: Diagnosis not present

## 2016-10-16 DIAGNOSIS — I25708 Atherosclerosis of coronary artery bypass graft(s), unspecified, with other forms of angina pectoris: Secondary | ICD-10-CM | POA: Insufficient documentation

## 2016-10-16 DIAGNOSIS — E782 Mixed hyperlipidemia: Secondary | ICD-10-CM

## 2016-10-16 DIAGNOSIS — I10 Essential (primary) hypertension: Secondary | ICD-10-CM | POA: Insufficient documentation

## 2016-10-16 DIAGNOSIS — I5023 Acute on chronic systolic (congestive) heart failure: Secondary | ICD-10-CM | POA: Insufficient documentation

## 2016-10-16 DIAGNOSIS — Z950 Presence of cardiac pacemaker: Secondary | ICD-10-CM | POA: Diagnosis not present

## 2016-10-16 MED ORDER — METOLAZONE 2.5 MG PO TABS: 2.5000 mg | ORAL_TABLET | Freq: Every day | ORAL | 0 refills | Status: DC

## 2016-10-16 MED ORDER — POTASSIUM CHLORIDE CRYS ER 20 MEQ PO TBCR: 20.0000 meq | EXTENDED_RELEASE_TABLET | Freq: Every day | ORAL | 3 refills | Status: DC

## 2016-10-16 MED ORDER — FUROSEMIDE 40 MG PO TABS: 40.0000 mg | ORAL_TABLET | Freq: Two times a day (BID) | ORAL | 3 refills | Status: DC

## 2016-10-16 NOTE — Addendum Note (Signed)
Addended by: Levonne Hubert on: 10/16/2016 01:51 PM   Modules accepted: Orders

## 2016-10-16 NOTE — Progress Notes (Signed)
SUBJECTIVE: The patient presents for follow up of acute on chronic systolic heart failure and CAD with CABG. He also has a pacemaker for complete heart block.  Nuclear stress test in March 2017 showed prior MI with no evidence of ischemia.  Echocardiogram on 05/12/15 showed mildly reduced left ventricular systolic function, LVEF 59-93%, with grade 1 diastolic dysfunction and normal filling pressures.  Weight is elevated to 203 lbs (194 on 06/28/16).  He has chronic exertional dyspnea which has gotten acutely worse. He has increased leg edema. He is short of breath now when bending over to tie his shoes.  He was at the beach last [place] and his daughter increased Lasix to 40 mg twice daily for 2 days and he felt somewhat better. He does not weigh himself daily. He denies chest pain, hematochezia, and melena.   Review of Systems: As per "subjective", otherwise negative.  Allergies  Allergen Reactions  . No Known Allergies     Current Outpatient Prescriptions  Medication Sig Dispense Refill  . acetaminophen (TYLENOL) 500 MG tablet Take 1,000 mg by mouth every 6 (six) hours as needed (for leg pain/pain).    Marland Kitchen amLODipine (NORVASC) 10 MG tablet Take 1 tablet (10 mg total) by mouth at bedtime. 30 tablet 0  . aspirin EC 81 MG tablet Take 81 mg by mouth daily.    . carvedilol (COREG) 12.5 MG tablet TAKE 1 TABLET BY MOUTH TWO  TIMES DAILY 180 tablet 1  . Doxylamine Succinate, Sleep, (SLEEP AID PO) Take 1 tablet by mouth at bedtime as needed (for sleep).    . furosemide (LASIX) 40 MG tablet Take 1 tablet (40 mg total) by mouth daily. (Will take twice a day x 3 days only, then back to daily.) 45 tablet 6  . hydrALAZINE (APRESOLINE) 50 MG tablet Take 25 mg by mouth 2 (two) times daily.    . meloxicam (MOBIC) 7.5 MG tablet Take 7.5 mg by mouth daily as needed for pain.     . Omega-3 Fatty Acids (FISH OIL) 1000 MG CAPS Take 2,000 mg by mouth daily.     . RaNITidine HCl (ACID REDUCER PO) Take 1  tablet by mouth daily as needed (for heartburn/indigestion).    . rosuvastatin (CRESTOR) 20 MG tablet Take 20 mg by mouth at bedtime.      No current facility-administered medications for this visit.     Past Medical History:  Diagnosis Date  . Arthritis    " all over the body"  . Coronary artery disease   . GERD (gastroesophageal reflux disease)   . Hyperlipidemia   . Hypertension   . Mobitz (type) II atrioventricular block    s/p STJ Accent pacemaker implanted by Dr [name] 10-16-2012  . Myocardial infarction [facility])    per pt., told that that the stress test shows that there is a weakening evident on the stress test  . Pacemaker 10/16/2012   St. Jude   . Personal history of colonic polyps-tubular adenomas 08/31/2013  . Vertigo     Past Surgical History:  Procedure Laterality Date  . cardiac bypass  1996   3 vessels  . CATARACT EXTRACTION W/ INTRAOCULAR LENS  IMPLANT, BILATERAL Bilateral 2010  . COLONOSCOPY    . CORONARY ARTERY BYPASS GRAFT    . INGUINAL HERNIA REPAIR Left 2010  . LUMBAR LAMINECTOMY/DECOMPRESSION MICRODISCECTOMY Right 07/19/2016   Procedure: Right Lumbar Four-Five Hemilaminectomy and Bilateral Lumbar Two-Three Laminectomy;  Surgeon: Eustace Moore, MD;  Location:  [facility];  Service: Neurosurgery;  Laterality: Right;  Right Lumbar Four-Five Hemilaminectomy and Bilateral Lumbar Two-Three Laminectomy  . LUMBAR SPINE SURGERY  2006  . NASAL SEPTUM SURGERY  09/01/13   Dr. Adriana Reams  . PACEMAKER INSERTION  10-16-2012   STJ Accent dual chamber pacemaker implanted by Dr [name] for symptomatic Mobitz II heart block  . PERMANENT PACEMAKER INSERTION N/A 10/16/2012   Procedure: PERMANENT PACEMAKER INSERTION;  Surgeon: Thompson Grayer, MD;  Location: [facility] CATH LAB;  Service: Cardiovascular;  Laterality: N/A;    Social History   Social History  . Marital status: Widowed    Spouse name: N/A  . Number of children: N/A  . Years of education: N/A   Occupational History  . Not on  file.   Social History Main Topics  . Smoking status: Former Smoker    Packs/day: 2.00    Years: 40.00    Types: Cigarettes    Quit date: 04/02/1994  . Smokeless tobacco: Never Used  . Alcohol use No     Comment: rare 01/01/14 occasionally- A  . Drug use: No  . Sexual activity: Yes   Other Topics Concern  . Not on file   Social History Narrative  . No narrative on file     Vitals:   10/16/16 1317  BP: 124/70  Pulse: 70  SpO2: 97%  Weight: 203 lb (92.1 kg)  Height: 5\' 7"  (1.702 m)    Wt Readings from Last 3 Encounters:  10/16/16 203 lb (92.1 kg)  07/11/16 189 lb 6.4 oz (85.9 kg)  06/28/16 194 lb (88 kg)     PHYSICAL EXAM General: NAD, visibly short of breath HEENT: Normal. Neck: +JVD Lungs: Bilateral crackles CV: Nondisplaced PMI.  Regular rate and rhythm, normal S1/S2, no S3/S4, no murmur.2+ b/l pitting pretibial  edema.   Abdomen: Firm, nontender, mild distention.  Neurologic: Alert and oriented.  Psych: Normal affect. Skin: Normal. Musculoskeletal: No gross deformities.    ECG: Most recent ECG reviewed.   Labs: Lab Results  Component Value Date/Time   K 4.3 07/11/2016 01:13 PM   BUN 24 (H) 07/11/2016 01:13 PM   CREATININE 1.06 07/11/2016 01:13 PM   ALT 20 01/12/2007 01:55 AM   TSH 1.104 10/03/2012 10:18 PM   HGB 14.7 07/11/2016 01:13 PM     Lipids: Lab Results  Component Value Date/Time   LDLCALC  01/12/2007 10:05 AM    90        Total Cholesterol/HDL:CHD Risk Coronary Heart Disease Risk Table                     Men   Women  1/2 Average Risk   3.4   3.3   CHOL  01/12/2007 10:05 AM    170        ATP III CLASSIFICATION:  <200     mg/dL   Desirable  200-239  mg/dL   Borderline High  >=240    mg/dL   High   TRIG 250 (H) 01/12/2007 10:05 AM   HDL 30 (L) 01/12/2007 10:05 AM       ASSESSMENT AND PLAN:  1. Acute on chronic systolic heart failure: He is decompensated. Denies chest pain to suggest an ischemic etiology. I will obtain an  echocardiogram to assess for interval changes in left ventricular function. I will increase Lasix to 80 mg twice daily for 3 days and then reduce to 40 mg twice daily as maintenance therapy. I will also initiate metolazone  2.5 mg daily for 3 days. I will obtain a chest x-ray today. I have instructed him to weigh himself daily. I will provide supplemental potassium chloride 20 mEq daily. I will obtain a basic metabolic panel and CBC within the next 2 days.  2. CAD with CABG: Symptomatically stable on aspirin, carvedilol, and Crestor. Most recent nuclear stress test reviewed above with no evidence of ischemia in March 2017. No changes to therapy.  3. HTN: Controlled. Monitor given increased diuretic requirement (see #1)  4. Hyperlipidemia: Continue statin.  5. CHB s/p PPM: Stable. Pacemaker is functioning normally. He follows with EP.     Disposition: Follow up 1 week.  Time spent: 40 minutes, of which greater than 50% was spent reviewing symptoms, relevant blood tests and studies, and discussing management plan with the patient.    Kate Sable, M.D., F.A.C.C.

## 2016-10-16 NOTE — Patient Instructions (Signed)
Your physician recommends that you schedule a follow-up appointment in: 1 Week   Your physician has recommended you make the following change in your medication:   Increase Lasix to 80 mg Two Times Daily for 3 Days, Lasix 40 mg Two Times Daily.   Start Potassium 20 mEq Daily   Start Metolazone 2.5 mg For 3 Days   Have Chest X-Ray Done Today.   Your physician recommends that you return for lab work on Thursday 10/18/16   Your physician has requested that you have an echocardiogram. Echocardiography is a painless test that uses sound waves to create images of your heart. It provides your doctor with information about the size and shape of your heart and how well your heart's chambers and valves are working. This procedure takes approximately one hour. There are no restrictions for this procedure.    If you need a refill on your cardiac medications before your next appointment, please call your pharmacy.  Thank you for choosing [facility]!

## 2016-10-17 ENCOUNTER — Other Ambulatory Visit: Payer: Self-pay | Admitting: Neurological Surgery

## 2016-10-17 ENCOUNTER — Telehealth: Payer: Self-pay | Admitting: *Deleted

## 2016-10-17 DIAGNOSIS — M545 Low back pain: Secondary | ICD-10-CM

## 2016-10-17 NOTE — Telephone Encounter (Signed)
-----   Message from Herminio Commons, MD sent at 10/16/2016  5:29 PM EDT ----- Possible mild CHF. Continue medical plan discussed at office visit, as he has fluid accumulation in abdomen and legs and is symptomatic with shortness of breath.

## 2016-10-17 NOTE — Telephone Encounter (Signed)
Called patient with test results. No answer. Left message to call back.  

## 2016-10-18 ENCOUNTER — Ambulatory Visit (HOSPITAL_COMMUNITY)
Admission: RE | Admit: 2016-10-18 | Discharge: 2016-10-18 | Disposition: A | Discharge status: Home / Self Care | Payer: Medicare Other | Source: Ambulatory Visit | Attending: Cardiovascular Disease | Admitting: Cardiovascular Disease

## 2016-10-18 ENCOUNTER — Other Ambulatory Visit (HOSPITAL_COMMUNITY)
Admission: RE | Admit: 2016-10-18 | Discharge: 2016-10-18 | Disposition: A | Discharge status: Home / Self Care | Payer: Medicare Other | Source: Ambulatory Visit | Attending: Cardiovascular Disease | Admitting: Cardiovascular Disease

## 2016-10-18 ENCOUNTER — Other Ambulatory Visit: Payer: Self-pay

## 2016-10-18 ENCOUNTER — Telehealth: Payer: Self-pay

## 2016-10-18 DIAGNOSIS — I071 Rheumatic tricuspid insufficiency: Secondary | ICD-10-CM | POA: Diagnosis not present

## 2016-10-18 DIAGNOSIS — I5023 Acute on chronic systolic (congestive) heart failure: Secondary | ICD-10-CM

## 2016-10-18 DIAGNOSIS — E8770 Fluid overload, unspecified: Secondary | ICD-10-CM

## 2016-10-18 DIAGNOSIS — R29898 Other symptoms and signs involving the musculoskeletal system: Secondary | ICD-10-CM | POA: Diagnosis not present

## 2016-10-18 DIAGNOSIS — I25708 Atherosclerosis of coronary artery bypass graft(s), unspecified, with other forms of angina pectoris: Secondary | ICD-10-CM | POA: Diagnosis not present

## 2016-10-18 DIAGNOSIS — Z950 Presence of cardiac pacemaker: Secondary | ICD-10-CM

## 2016-10-18 DIAGNOSIS — E782 Mixed hyperlipidemia: Secondary | ICD-10-CM

## 2016-10-18 DIAGNOSIS — I10 Essential (primary) hypertension: Secondary | ICD-10-CM

## 2016-10-18 DIAGNOSIS — I700 Atherosclerosis of aorta: Secondary | ICD-10-CM | POA: Insufficient documentation

## 2016-10-18 LAB — ECHOCARDIOGRAM COMPLETE
CHL CUP RV SYS PRESS: 46 mmHg
E/e' ratio: 15.7
EWDT: 151 ms
FS: 14 % — AB (ref 28–44)
IVS/LV PW RATIO, ED: 0.97
LA diam index: 2.03 cm/m2
LA vol A4C: 89.5 ml
LA vol index: 42.1 mL/m2
LASIZE: 43 mm
LAVOL: 89.1 mL
LEFT ATRIUM END SYS DIAM: 43 mm
LV E/e' medial: 15.7
LV TDI E'MEDIAL: 4.9
LV dias vol: 169 mL — AB (ref 62–150)
LV sys vol: 114 mL — AB
LVDIAVOLIN: 80 mL/m2
LVEEAVG: 15.7
LVELAT: 7.07 cm/s
LVOT VTI: 17.7 cm
LVOT area: 2.84 cm2
LVOT diameter: 19 mm
LVOT peak grad rest: 3 mmHg
LVOT peak vel: 84.9 cm/s
LVOTSV: 50 mL
LVSYSVOLIN: 54 mL/m2
MV Dec: 151
MV pk A vel: 77.1 m/s
MVPG: 5 mmHg
MVPKEVEL: 111 m/s
PW: 10.6 mm — AB (ref 0.6–1.1)
RV TAPSE: 17 mm
Reg peak vel: 309 cm/s
Simpson's disk: 33
Stroke v: 55 ml
TDI e' lateral: 7.07
TR max vel: 309 cm/s

## 2016-10-18 LAB — CBC WITH DIFFERENTIAL/PLATELET
BASOS PCT: 1 %
Basophils Absolute: 0 10*3/uL (ref 0.0–0.1)
Eosinophils Absolute: 0.4 10*3/uL (ref 0.0–0.7)
Eosinophils Relative: 5 %
HEMATOCRIT: 43.4 % (ref 39.0–52.0)
Hemoglobin: 14.2 g/dL (ref 13.0–17.0)
LYMPHS ABS: 1.2 10*3/uL (ref 0.7–4.0)
LYMPHS PCT: 16 %
MCH: 31.1 pg (ref 26.0–34.0)
MCHC: 32.7 g/dL (ref 30.0–36.0)
MCV: 95 fL (ref 78.0–100.0)
MONO ABS: 0.7 10*3/uL (ref 0.1–1.0)
MONOS PCT: 9 %
NEUTROS ABS: 5.3 10*3/uL (ref 1.7–7.7)
Neutrophils Relative %: 69 %
Platelets: 205 10*3/uL (ref 150–400)
RBC: 4.57 MIL/uL (ref 4.22–5.81)
RDW: 15.9 % — AB (ref 11.5–15.5)
WBC: 7.6 10*3/uL (ref 4.0–10.5)

## 2016-10-18 LAB — TROPONIN I: Troponin I: <0.03 ng/mL (ref <0.03)

## 2016-10-18 LAB — BASIC METABOLIC PANEL
Anion gap: 9 (ref 5–15)
BUN: 32 mg/dL — AB (ref 6–20)
CALCIUM: 9.8 mg/dL (ref 8.9–10.3)
CHLORIDE: 97 mmol/L — AB (ref 101–111)
CO2: 34 mmol/L — AB (ref 22–32)
CREATININE: 1.47 mg/dL — AB (ref 0.61–1.24)
GFR calc Af Amer: 51 mL/min — ABNORMAL LOW (ref >60)
GFR calc non Af Amer: 44 mL/min — ABNORMAL LOW (ref >60)
GLUCOSE: 100 mg/dL — AB (ref 65–99)
Potassium: 4 mmol/L (ref 3.5–5.1)
Sodium: 140 mmol/L (ref 135–145)

## 2016-10-18 LAB — BRAIN NATRIURETIC PEPTIDE: B Natriuretic Peptide: 361 pg/mL — ABNORMAL HIGH (ref 0.0–100.0)

## 2016-10-18 MED ORDER — METOLAZONE 5 MG PO TABS: ORAL_TABLET | ORAL | 0 refills | Status: DC

## 2016-10-18 NOTE — Telephone Encounter (Signed)
I spoke with daughter and she understands to extend extra lasix for total of 5 days vs 3 and to increase metolazone through Sunday 10/21/16   She will weigh him daily and we will touch base on 10/22/16 and she understands if he needs to go to the ED over the weekend, they will.

## 2016-10-18 NOTE — Progress Notes (Signed)
*  PRELIMINARY RESULTS* Echocardiogram 2D Echocardiogram has been performed.  Ricardo Hunt 10/18/2016, 12:47 PM

## 2016-10-18 NOTE — Telephone Encounter (Signed)
-----   Message from Herminio Commons, MD sent at 10/18/2016  1:21 PM EDT ----- There has been a significant decline in cardiac function. Please call patient to see if he has had any improvement in abdominal and leg swelling and shortness of breath since I saw him in clinic. If symptoms do not improve by Saturday, please have him go to ED as he may require IV diuresis.

## 2016-10-18 NOTE — Telephone Encounter (Signed)
I spoke with daughter Lattie Haw and gave her echo results, she states they bought a scale yesterday and pt's home weight was 190 lbs, 13 lbs less than visit her on 10/16/16. His swelling is decreased slightly and his SOB also "a little" better than at office visit. I will call daughter tomorrow and get update.She understands he may need to got to the ED for IV diuresis

## 2016-10-18 NOTE — Telephone Encounter (Signed)
-----   Message from Herminio Commons, MD sent at 10/18/2016  2:17 PM EDT ----- Please check BNP and troponin today.

## 2016-10-18 NOTE — Telephone Encounter (Signed)
-----   Message from Herminio Commons, MD sent at 10/18/2016  1:52 PM EDT ----- Please have him take Lasix 80 mg bid for a total of 5 days rather than 3 days. Also, have him take 5 mg metolazone (rather than 2.5 mg) today, tomorrow, Saturday, and Sunday, and then stop metolazone altogether.

## 2016-10-18 NOTE — Telephone Encounter (Signed)
LM with patient and daughter that patient needs lab work today

## 2016-10-22 ENCOUNTER — Other Ambulatory Visit: Payer: Self-pay | Admitting: Cardiovascular Disease

## 2016-10-22 ENCOUNTER — Telehealth: Payer: Self-pay

## 2016-10-22 ENCOUNTER — Other Ambulatory Visit (HOSPITAL_COMMUNITY)
Admission: RE | Admit: 2016-10-22 | Discharge: 2016-10-22 | Disposition: A | Discharge status: Home / Self Care | Payer: Medicare Other | Source: Ambulatory Visit | Attending: Cardiovascular Disease | Admitting: Cardiovascular Disease

## 2016-10-22 DIAGNOSIS — I5023 Acute on chronic systolic (congestive) heart failure: Secondary | ICD-10-CM | POA: Diagnosis present

## 2016-10-22 LAB — BASIC METABOLIC PANEL
Anion gap: 11 (ref 5–15)
BUN: 46 mg/dL — AB (ref 6–20)
CO2: 36 mmol/L — ABNORMAL HIGH (ref 22–32)
CREATININE: 1.72 mg/dL — AB (ref 0.61–1.24)
Calcium: 10.3 mg/dL (ref 8.9–10.3)
Chloride: 90 mmol/L — ABNORMAL LOW (ref 101–111)
GFR calc Af Amer: 42 mL/min — ABNORMAL LOW (ref >60)
GFR, EST NON AFRICAN AMERICAN: 36 mL/min — AB (ref >60)
GLUCOSE: 126 mg/dL — AB (ref 65–99)
Potassium: 3.4 mmol/L — ABNORMAL LOW (ref 3.5–5.1)
SODIUM: 137 mmol/L (ref 135–145)

## 2016-10-22 LAB — MAGNESIUM: MAGNESIUM: 2.2 mg/dL (ref 1.7–2.4)

## 2016-10-22 NOTE — Telephone Encounter (Signed)
With that much fluid loss he needs a BMET and Mg ordered for today. I would continue lasix 80mg  bid if he remains fluid overloaded, hold off on any additional metolazone until we get his labs back. Labs should have my name on them so they appear in my inbox  J Aniella Wandrey MD

## 2016-10-22 NOTE — Telephone Encounter (Signed)
Daughter informed of dr Nelly Laurence orders, will have labs done today

## 2016-10-22 NOTE — Addendum Note (Signed)
Addended by: Barbarann Ehlers A on: 10/22/2016 11:12 AM   Modules accepted: Orders

## 2016-10-22 NOTE — Telephone Encounter (Signed)
Pt of Dr Bronson Ing: Status update: Pt has lost 10 lbs over the weekend.Total of 23 lbs since 7/17. Abdomen remains swollen and daughter states pt has poor appetite and nausea.She also noted a couple of red spots on abdomen.  Please advise

## 2016-10-23 ENCOUNTER — Telehealth: Payer: Self-pay

## 2016-10-23 NOTE — Telephone Encounter (Signed)
Per daughter:, pt will increase K+ for 2 days, He is off zaroxolyn, she is giving him lasix 40 mg BID since yesterday

## 2016-10-23 NOTE — Telephone Encounter (Signed)
-----   Message from Arnoldo Lenis, MD sent at 10/23/2016 12:30 PM EDT ----- Potassium is low, verify he is taking KCl 35meQ daily, and if so increase to 45mEq bid x 2 days. Uptrend in kidney function, I would lower his lasix to 80mg  in AM and 40mg  in PM. Needs repeat labs in 1 week with BMET/Mg. Make sure he has stopped metoalzone.    Zandra Abts MD

## 2016-10-23 NOTE — Telephone Encounter (Signed)
LM at pt's home and daughter's cell

## 2016-10-23 NOTE — Telephone Encounter (Signed)
-----   Message from Arnoldo Lenis, MD sent at 10/23/2016 12:30 PM EDT ----- Potassium is low, verify he is taking KCl 33meQ daily, and if so increase to 36mEq bid x 2 days. Uptrend in kidney function, I would lower his lasix to 80mg  in AM and 40mg  in PM. Needs repeat labs in 1 week with BMET/Mg. Make sure he has stopped metoalzone.    Zandra Abts MD

## 2016-10-23 NOTE — Telephone Encounter (Signed)
Wt today is 178 lbs total wt loss since 10/16/16 -1 week ago is 25 lbs   Dr Harl Bowie said pt could stay at lasix 40 mg BID and could go up to 80 mg am and 40 mg if weight returned.    Has fu apt next week

## 2016-10-29 ENCOUNTER — Inpatient Hospital Stay
Admission: RE | Admit: 2016-10-29 | Discharge: 2016-10-29 | Disposition: A | Discharge status: Home / Self Care | Payer: Medicare Other | Source: Ambulatory Visit | Attending: Neurological Surgery | Admitting: Neurological Surgery

## 2016-10-29 ENCOUNTER — Other Ambulatory Visit: Payer: Medicare Other

## 2016-10-29 NOTE — Discharge Instructions (Signed)

## 2016-10-30 ENCOUNTER — Ambulatory Visit (INDEPENDENT_AMBULATORY_CARE_PROVIDER_SITE_OTHER): Payer: Medicare Other | Admitting: Adult Health

## 2016-10-30 ENCOUNTER — Encounter: Payer: Self-pay | Admitting: Adult Health

## 2016-10-30 ENCOUNTER — Telehealth: Payer: Self-pay | Admitting: *Deleted

## 2016-10-30 ENCOUNTER — Other Ambulatory Visit (HOSPITAL_COMMUNITY)
Admission: RE | Admit: 2016-10-30 | Discharge: 2016-10-30 | Disposition: A | Discharge status: Home / Self Care | Payer: Medicare Other | Source: Ambulatory Visit | Attending: Adult Health | Admitting: Adult Health

## 2016-10-30 VITALS — BP 130/80 | HR 70 | Ht 67.0 in | Wt 187.0 lb

## 2016-10-30 DIAGNOSIS — Z79899 Other long term (current) drug therapy: Secondary | ICD-10-CM | POA: Diagnosis present

## 2016-10-30 DIAGNOSIS — I10 Essential (primary) hypertension: Secondary | ICD-10-CM

## 2016-10-30 DIAGNOSIS — I43 Cardiomyopathy in diseases classified elsewhere: Secondary | ICD-10-CM | POA: Diagnosis not present

## 2016-10-30 DIAGNOSIS — I2510 Atherosclerotic heart disease of native coronary artery without angina pectoris: Secondary | ICD-10-CM | POA: Diagnosis not present

## 2016-10-30 DIAGNOSIS — I5022 Chronic systolic (congestive) heart failure: Secondary | ICD-10-CM

## 2016-10-30 LAB — BASIC METABOLIC PANEL
ANION GAP: 11 (ref 5–15)
BUN: 32 mg/dL — AB (ref 6–20)
CALCIUM: 9.5 mg/dL (ref 8.9–10.3)
CO2: 30 mmol/L (ref 22–32)
CREATININE: 1.29 mg/dL — AB (ref 0.61–1.24)
Chloride: 100 mmol/L — ABNORMAL LOW (ref 101–111)
GFR calc Af Amer: 60 mL/min — ABNORMAL LOW (ref >60)
GFR, EST NON AFRICAN AMERICAN: 51 mL/min — AB (ref >60)
GLUCOSE: 159 mg/dL — AB (ref 65–99)
Potassium: 3.3 mmol/L — ABNORMAL LOW (ref 3.5–5.1)
Sodium: 141 mmol/L (ref 135–145)

## 2016-10-30 NOTE — Telephone Encounter (Signed)
-----   Message from Lendon Colonel, NP sent at 10/30/2016  4:39 PM EDT ----- Please have him increase his potassium to BID for two days and then daily. He can take extra potassium if he takes extra dose of lasix, only. Kidney function improving.

## 2016-10-30 NOTE — Telephone Encounter (Signed)
Called patient with test results. No answer. Left message to call back.  

## 2016-10-30 NOTE — Progress Notes (Signed)
Cardiology Office Note   Date:  10/30/2016   ID:  Ricardo Hunt, DOB 03-22-1941, MRN [id number]  PCP:  Dione Housekeeper, MD  Cardiologist:  Bronson Ing  Chief Complaint  Patient presents with  . Congestive Heart Failure  . Coronary Artery Disease      History of Present Illness: Ricardo Hunt is a 76 y.o. male who presents for ongoing assessment and management of chronic systolic heart failure, coronary artery disease with history of coronary artery bypass grafting, complete heart block with pacemaker in situ. The patient was last seen in the office on 10/16/2016  by Dr. Bronson Ing. At that time he was fluid overload, echocardiogram was ordered and Lasix was increased to 80 mg twice a day for 3 days and then to reduce to 40 mg twice a day as maintenance therapy. The patient was started on metolazone 2.5 mg daily for 3 days. He is advised to weigh himself daily. He was given potassium supplement.   The patient's daughter called our office on 10/22/2016 stating that he had lost 23 pounds of fluid, however his abdomen remained swollen and he had a poor appetite with significant nausea. Patient was ordered a BMET and magnesium due to extensive diuresis. Metolazone was discontinued.  Follow-up labs on 10/22/2016 revealed sodium of 137 potassium 3.4 chloride 90 CO2 36 glucose 226 BUN 46 creatinine 1.72 BNP 361.  Echocardiogram dated 10/18/2016 demonstrated significant reduction in LV systolic function now at 67%. Left ventricle: The cavity size was moderately dilated. Wall   thickness was increased in a pattern of mild LVH. Systolic   function was severely reduced. The estimated ejection fraction   was 20%. Diffuse hypokinesis. Features are consistent with a   pseudonormal left ventricular filling pattern, with concomitant   abnormal relaxation and increased filling pressure (grade 2   diastolic dysfunction). Doppler parameters are consistent with   high ventricular filling pressure. - Regional  wall motion abnormality: Akinesis of the mid inferior   myocardium; severe hypokinesis of the mid inferoseptal and mid   inferolateral myocardium. - Aortic valve: Moderately calcified annulus. Moderately calcified   leaflets. Cusp separation was reduced. Morphologically, there   appears to be at least mild if not mild to moderate aortic   valvular stenosis. - Aorta: Mild aortic root dilatation. - Mitral valve: Calcified annulus. There was mild to moderate   regurgitation. - Left atrium: The atrium was moderately dilated. - Right ventricle: Pacer wire or catheter noted in right ventricle.   Systolic function was reduced. - Right atrium: The atrium was mildly dilated. Pacer wire or   catheter noted in right atrium. - Tricuspid valve: There was mild-moderate regurgitation. - Pulmonary arteries: PA peak pressure: 41 mm Hg (S).  The patient is feeling much better having lost the fluid weight. He was unaware of how reduced his LV function was, he was told his heart was weak but was not aware of that actual percentage. He continues to eat salty foods. He has eaten hot dogs today. He denies recurrent chest pain, he continues to have some mild dyspnea on exertion. He does not have any PND or orthopnea. He has not been weighing himself daily.  Past Medical History:  Diagnosis Date  . Arthritis    " all over the body"  . Coronary artery disease   . GERD (gastroesophageal reflux disease)   . Hyperlipidemia   . Hypertension   . Mobitz (type) II atrioventricular block    s/p STJ Accent pacemaker implanted by Dr  Allred 10-16-2012  . Myocardial infarction [facility])    per pt., told that that the stress test shows that there is a weakening evident on the stress test  . Pacemaker 10/16/2012   St. Jude   . Personal history of colonic polyps-tubular adenomas 08/31/2013  . Vertigo     Past Surgical History:  Procedure Laterality Date  . cardiac bypass  1996   3 vessels  . CATARACT EXTRACTION W/  INTRAOCULAR LENS  IMPLANT, BILATERAL Bilateral 2010  . COLONOSCOPY    . CORONARY ARTERY BYPASS GRAFT    . INGUINAL HERNIA REPAIR Left 2010  . LUMBAR LAMINECTOMY/DECOMPRESSION MICRODISCECTOMY Right 07/19/2016   Procedure: Right Lumbar Four-Five Hemilaminectomy and Bilateral Lumbar Two-Three Laminectomy;  Surgeon: Eustace Moore, MD;  Location: [facility];  Service: Neurosurgery;  Laterality: Right;  Right Lumbar Four-Five Hemilaminectomy and Bilateral Lumbar Two-Three Laminectomy  . LUMBAR SPINE SURGERY  2006  . NASAL SEPTUM SURGERY  09/01/13   Dr. Adriana Reams  . PACEMAKER INSERTION  10-16-2012   STJ Accent dual chamber pacemaker implanted by Dr [name] for symptomatic Mobitz II heart block  . PERMANENT PACEMAKER INSERTION N/A 10/16/2012   Procedure: PERMANENT PACEMAKER INSERTION;  Surgeon: Thompson Grayer, MD;  Location: [facility] CATH LAB;  Service: Cardiovascular;  Laterality: N/A;     Current Outpatient Prescriptions  Medication Sig Dispense Refill  . acetaminophen (TYLENOL) 500 MG tablet Take 1,000 mg by mouth every 6 (six) hours as needed (for leg pain/pain).    Marland Kitchen amLODipine (NORVASC) 10 MG tablet Take 1 tablet (10 mg total) by mouth at bedtime. 30 tablet 0  . aspirin EC 81 MG tablet Take 81 mg by mouth daily.    . carvedilol (COREG) 12.5 MG tablet TAKE 1 TABLET BY MOUTH TWO  TIMES DAILY 180 tablet 3  . Doxylamine Succinate, Sleep, (SLEEP AID PO) Take 1 tablet by mouth at bedtime as needed (for sleep).    . furosemide (LASIX) 40 MG tablet Take 1 tablet (40 mg total) by mouth 2 (two) times daily. 180 tablet 3  . hydrALAZINE (APRESOLINE) 50 MG tablet Take 25 mg by mouth 2 (two) times daily.    . meloxicam (MOBIC) 7.5 MG tablet Take 7.5 mg by mouth daily as needed for pain.     . Omega-3 Fatty Acids (FISH OIL) 1000 MG CAPS Take 2,000 mg by mouth daily.     . potassium chloride SA (KLOR-CON M20) 20 MEQ tablet Take 1 tablet (20 mEq total) by mouth daily. 90 tablet 3  . RaNITidine HCl (ACID REDUCER PO) Take  1 tablet by mouth daily as needed (for heartburn/indigestion).    . rosuvastatin (CRESTOR) 20 MG tablet Take 20 mg by mouth at bedtime.      No current facility-administered medications for this visit.     Allergies:   No known allergies    Social History:  The patient  reports that he quit smoking about 22 years ago. His smoking use included Cigarettes. He has a 80.00 pack-year smoking history. He has never used smokeless tobacco. He reports that he does not drink alcohol or use drugs.   Family History:  The patient's family history includes Breast cancer in his unknown relative; CAD in his father and mother; Diabetes in his mother; Hypertension in his father.    ROS: All other systems are reviewed and negative. Unless otherwise mentioned in H&P    PHYSICAL EXAM: VS:  BP 130/80   Pulse 70   Ht 5\' 7"  (1.702 m)  Wt 187 lb (84.8 kg)   SpO2 96%   BMI 29.29 kg/m  , BMI Body mass index is 29.29 kg/m. GEN: Well nourished, well developed, in no acute distress  HEENT: normal raspy voice. Neck: no JVD, carotid bruits, or masses Cardiac: RRR; no murmurs, rubs, or gallops,no edema  Respiratory:  Clear to auscultation bilaterally, normal work of breathing. GI: soft, nontender, nondistended, + BS MS: no deformity or atrophy  Skin: warm and dry, no rash Neuro:  Strength and sensation are intact Psych: euthymic mood, full affect   Recent Labs: 10/18/2016: B Natriuretic Peptide 361.0; Hemoglobin 14.2; Platelets 205 10/22/2016: BUN 46; Creatinine, Ser 1.72; Magnesium 2.2; Potassium 3.4; Sodium 137    Lipid Panel    Component Value Date/Time   CHOL  01/12/2007 1005    170        ATP III CLASSIFICATION:  <200     mg/dL   Desirable  200-239  mg/dL   Borderline High  >=240    mg/dL   High   TRIG 250 (H) 01/12/2007 1005   HDL 30 (L) 01/12/2007 1005   CHOLHDL 5.7 01/12/2007 1005   VLDL 50 (H) 01/12/2007 1005   LDLCALC  01/12/2007 1005    90        Total Cholesterol/HDL:CHD  Risk Coronary Heart Disease Risk Table                     Men   Women  1/2 Average Risk   3.4   3.3      Wt Readings from Last 3 Encounters:  10/30/16 187 lb (84.8 kg)  10/16/16 203 lb (92.1 kg)  07/11/16 189 lb 6.4 oz (85.9 kg)     ASSESSMENT AND PLAN:  1. Chronic systolic heart failure: The patient has diuresed a proximally 23 pounds with increased dose of Lasix to 80 mg twice a day, and 3 days of metolazone. Repeat labs reveals reduced potassium at 3.5. He is advised to take additional doses.  He is now taking Lasix 40 mg twice a day. I've advised him on daily weights, low sodium diet which I provided for him in written form. I have given him a daily weight recording sheet. I've talked with him extensively about salt intake and how it impacts his fluid retention with his reduced EF. He verbalizes understanding. I've advised him take an extra of Lasix if he gains 3-5 pounds along with an extra dose of potassium.  2. Hypertension: Blood pressure is not optimal for reduced EF. I rechecked his blood pressure in the exam room. It is now 120/68. This is still not optimal but I will follow this along more closely. May need to increase hydralazine to 50 mg twice a day. Awaiting labs.  3. Chronic kidney disease: He would not be a candidate for Entresto or ACE inhibitor. Titration of medication will begin with hydralazine increased. He is advised to avoid Mobic. Repeating BMET.  4. CAD: History of coronary artery bypass grafting. He remains on carvedilol 12.5 mg twice a day, also statin therapy. He has not had any ischemic testing since 2014. May need to consider repeating this with reduced EF. Echocardiogram dated 05/12/2015 revealed an EF of 45-50%. With kidney function reduced, would like to avoid repeating catheterization at this time until this has improved. We'll discuss this further with Dr. Bronson Ing.   5. PPM in situ: Continue pacemaker interrogation per protocol. 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 Last interrogated on 10/18/2016.  Current medicines are reviewed at length with the patient today.    Labs/ tests ordered today include: BMET  Phill Myron. West Pugh, ANP, AACC   10/30/2016 3:00 PM    [facility] [address], [place], [place] [address] Phone: [phone]; Fax: [phone]

## 2016-10-30 NOTE — Patient Instructions (Signed)
Medication Instructions:  Your physician recommends that you continue on your current medications as directed. Please refer to the Current Medication list given to you today.   Labwork: Your physician recommends that you return for lab work in: Today    Testing/Procedures: NONE  Follow-Up: Your physician recommends that you schedule a follow-up appointment in: 1 Month    Any Other Special Instructions Will Be Listed Below (If Applicable).     If you need a refill on your cardiac medications before your next appointment, please call your pharmacy.  Thank you for choosing [facility]!

## 2016-11-30 ENCOUNTER — Encounter: Payer: Self-pay | Admitting: *Deleted

## 2016-11-30 ENCOUNTER — Encounter: Payer: Self-pay | Admitting: Adult Health

## 2016-11-30 ENCOUNTER — Ambulatory Visit: Payer: Medicare Other | Admitting: Adult Health

## 2016-11-30 NOTE — Progress Notes (Deleted)
Cardiology Office Note   Date:  11/30/2016   ID:  Ricardo Hunt, DOB Dec 12, 1940, MRN [id number]  PCP:  Dione Housekeeper, MD  Cardiologist:  Dickie La chief complaint on file.     History of Present Illness: Ricardo Hunt is a 76 y.o. male who presents for ongoing assessment and management of coronary artery disease with history of CABG, complete heart block with pacemaker in situ, and chronic systolic heart failure (LVEF 20%).. The patient was last seen in the office on 10/30/2016, at that time she was worked in due to weight gain and fluid retention.. The patient unfortunately continued TE salty foods, but denied medical noncompliance.  On last office visit dated 10/30/2016, the patient had diuresed approximately 23 pounds with increased dose of Lasix to 80 mg twice a day in 3 days and metolazone. He was continued on daily Lasix 40 mg twice a day, counseled on low sodium diet. He was advised to take a extra dose of Lasix if he gains 3-5 pounds, and also taken Axid dose of potassium at that time. Blood pressure was not optimal for his reduced EF, consideration for increasing hydralazine to 50 mg twice a day was to be discussed on next office visit should his blood pressure remain elevated. Consideration for repeat catheterization if repeated echocardiograms continued to find reduced EF. Possible upgrade to ICD from permanent pacemaker may need to be discussed.    Past Medical History:  Diagnosis Date  . Arthritis    " all over the body"  . Coronary artery disease   . GERD (gastroesophageal reflux disease)   . Hyperlipidemia   . Hypertension   . Mobitz (type) II atrioventricular block    s/p STJ Accent pacemaker implanted by Dr [name] 10-16-2012  . Myocardial infarction [facility])    per pt., told that that the stress test shows that there is a weakening evident on the stress test  . Pacemaker 10/16/2012   St. Jude   . Personal history of colonic polyps-tubular adenomas 08/31/2013  . Vertigo      Past Surgical History:  Procedure Laterality Date  . cardiac bypass  1996   3 vessels  . CATARACT EXTRACTION W/ INTRAOCULAR LENS  IMPLANT, BILATERAL Bilateral 2010  . COLONOSCOPY    . CORONARY ARTERY BYPASS GRAFT    . INGUINAL HERNIA REPAIR Left 2010  . LUMBAR LAMINECTOMY/DECOMPRESSION MICRODISCECTOMY Right 07/19/2016   Procedure: Right Lumbar Four-Five Hemilaminectomy and Bilateral Lumbar Two-Three Laminectomy;  Surgeon: Eustace Moore, MD;  Location: [facility];  Service: Neurosurgery;  Laterality: Right;  Right Lumbar Four-Five Hemilaminectomy and Bilateral Lumbar Two-Three Laminectomy  . LUMBAR SPINE SURGERY  2006  . NASAL SEPTUM SURGERY  09/01/13   Dr. Adriana Reams  . PACEMAKER INSERTION  10-16-2012   STJ Accent dual chamber pacemaker implanted by Dr [name] for symptomatic Mobitz II heart block  . PERMANENT PACEMAKER INSERTION N/A 10/16/2012   Procedure: PERMANENT PACEMAKER INSERTION;  Surgeon: Thompson Grayer, MD;  Location: [facility] C[facility] LAB;  Service: Cardiovascular;  Laterality: N/A;     Current Outpatient Prescriptions  Medication Sig Dispense Refill  . acetaminophen (TYLENOL) 500 MG tablet Take 1,000 mg by mouth every 6 (six) hours as needed (for leg pain/pain).    Marland Kitchen amLODipine (NORVASC) 10 MG tablet Take 1 tablet (10 mg total) by mouth at bedtime. 30 tablet 0  . aspirin EC 81 MG tablet Take 81 mg by mouth daily.    . carvedilol (COREG) 12.5 MG tablet TAKE 1 TABLET  BY MOUTH TWO  TIMES DAILY 180 tablet 3  . Doxylamine Succinate, Sleep, (SLEEP AID PO) Take 1 tablet by mouth at bedtime as needed (for sleep).    . furosemide (LASIX) 40 MG tablet Take 1 tablet (40 mg total) by mouth 2 (two) times daily. 180 tablet 3  . hydrALAZINE (APRESOLINE) 50 MG tablet Take 25 mg by mouth 2 (two) times daily.    . meloxicam (MOBIC) 7.5 MG tablet Take 7.5 mg by mouth daily as needed for pain.     . Omega-3 Fatty Acids (FISH OIL) 1000 MG CAPS Take 2,000 mg by mouth daily.     . potassium chloride SA  (KLOR-CON M20) 20 MEQ tablet Take 1 tablet (20 mEq total) by mouth daily. 90 tablet 3  . RaNITidine HCl (ACID REDUCER PO) Take 1 tablet by mouth daily as needed (for heartburn/indigestion).    . rosuvastatin (CRESTOR) 20 MG tablet Take 20 mg by mouth at bedtime.      No current facility-administered medications for this visit.     Allergies:   No known allergies    Social History:  The patient  reports that he quit smoking about 22 years ago. His smoking use included Cigarettes. He has a 80.00 pack-year smoking history. He has never used smokeless tobacco. He reports that he does not drink alcohol or use drugs.   Family History:  The patient's family history includes Breast cancer in his unknown relative; CAD in his father and mother; Diabetes in his mother; Hypertension in his father.    ROS: All other systems are reviewed and negative. Unless otherwise mentioned in H&P    PHYSICAL EXAM: VS:  There were no vitals taken for this visit. , BMI There is no height or weight on file to calculate BMI. GEN: Well nourished, well developed, in no acute distress  HEENT: normal  Neck: no JVD, carotid bruits, or masses Cardiac: ***RRR; no murmurs, rubs, or gallops,no edema  Respiratory:  clear to auscultation bilaterally, normal work of breathing GI: soft, nontender, nondistended, + BS MS: no deformity or atrophy  Skin: warm and dry, no rash Neuro:  Strength and sensation are intact Psych: euthymic mood, full affect   EKG:  EKG {ACTION; IS/IS INO:[id number]} ordered today. The ekg ordered today demonstrates ***   Recent Labs: 10/18/2016: B Natriuretic Peptide 361.0; Hemoglobin 14.2; Platelets 205 10/22/2016: Magnesium 2.2 10/30/2016: BUN 32; Creatinine, Ser 1.29; Potassium 3.3; Sodium 141    Lipid Panel    Component Value Date/Time   CHOL  01/12/2007 1005    170        ATP III CLASSIFICATION:  <200     mg/dL   Desirable  200-239  mg/dL   Borderline High  >=240    mg/dL   High   TRIG  250 (H) 01/12/2007 1005   HDL 30 (L) 01/12/2007 1005   CHOLHDL 5.7 01/12/2007 1005   VLDL 50 (H) 01/12/2007 1005   LDLCALC  01/12/2007 1005    90        Total Cholesterol/HDL:CHD Risk Coronary Heart Disease Risk Table                     Men   Women  1/2 Average Risk   3.4   3.3      Wt Readings from Last 3 Encounters:  10/30/16 187 lb (84.8 kg)  10/16/16 203 lb (92.1 kg)  07/11/16 189 lb 6.4 oz (85.9 kg)  Other studies Reviewed: Echocardiogram 2016/11/13 Left ventricle: The cavity size was moderately dilated. Wall   thickness was increased in a pattern of mild LVH. Systolic   function was severely reduced. The estimated ejection fraction   was 20%. Diffuse hypokinesis. Features are consistent with a   pseudonormal left ventricular filling pattern, with concomitant   abnormal relaxation and increased filling pressure (grade 2   diastolic dysfunction). Doppler parameters are consistent with   high ventricular filling pressure. - Regional wall motion abnormality: Akinesis of the mid inferior   myocardium; severe hypokinesis of the mid inferoseptal and mid   inferolateral myocardium. - Aortic valve: Moderately calcified annulus. Moderately calcified   leaflets. Cusp separation was reduced. Morphologically, there   appears to be at least mild if not mild to moderate aortic   valvular stenosis. - Aorta: Mild aortic root dilatation. - Mitral valve: Calcified annulus. There was mild to moderate   regurgitation. - Left atrium: The atrium was moderately dilated. - Right ventricle: Pacer wire or catheter noted in right ventricle.   Systolic function was reduced. - Right atrium: The atrium was mildly dilated. Pacer wire or   catheter noted in right atrium. - Tricuspid valve: There was mild-moderate regurgitation. - Pulmonary arteries: PA peak pressure: 41 mm Hg (S).  ASSESSMENT AND PLAN:  1.  ***   Current medicines are reviewed at length with the patient today.     Labs/ tests ordered today include: *** Phill Myron. West Pugh, ANP, AACC   11/30/2016 7:15 AM    [facility]  [address] [place], [place] [address] Phone: [phone]; Fax: [phone]

## 2016-12-25 ENCOUNTER — Ambulatory Visit: Payer: Medicare Other | Admitting: Cardiovascular Disease

## 2017-03-21 ENCOUNTER — Encounter: Payer: Medicare Other | Admitting: *Deleted

## 2017-03-21 ENCOUNTER — Telehealth: Payer: Self-pay | Admitting: *Deleted

## 2017-03-21 NOTE — Telephone Encounter (Signed)
LMOM to return call to [facility].  [facility]lin alert- >24 hours of AF 03/19/17, new, ASA only. Calling to see if the patient was aware/symptomatic during the episode.

## 2017-03-22 NOTE — Telephone Encounter (Signed)
LMOM to return call to [facility].  Alert placed in Dr. Jackalyn Lombard red folder.

## 2017-03-28 NOTE — Telephone Encounter (Signed)
Ricardo Grayer, MD  Ranee Gosselin, RN        He sees [place] in February. If he is now back in sinus, lets just discuss with him then (Make a note for [place] to discuss). If he is still in AF when you return from Christmas break then he should probably come to the AF clinic or see [name] or Dr Bronson Ing.    No repeat transmission, unable to reach patient. Message sent to scheduling to arrange his f/u in [place].

## 2017-04-01 ENCOUNTER — Other Ambulatory Visit: Payer: Self-pay | Admitting: Internal Medicine

## 2017-05-02 ENCOUNTER — Encounter: Payer: Self-pay | Admitting: *Deleted

## 2017-05-03 ENCOUNTER — Encounter: Payer: Medicare Other | Admitting: Internal Medicine

## 2017-05-27 NOTE — Progress Notes (Deleted)
HPI: FU CAD (s/p CABG 8182), chronic systolic CHF, prior pacemaker. Nuclear study 3/17 showed EF 43 and inferior/apical scar; no ischemia. ABIs 10/17 normal. Last echo 7/18 showed EF 20, grade 2 DD, mild to moderate AS, mild to moderate MR, moderate LAE, mild RAE, mild to moderate TR. Since last seen,   Current Outpatient Medications  Medication Sig Dispense Refill  . acetaminophen (TYLENOL) 500 MG tablet Take 1,000 mg by mouth every 6 (six) hours as needed (for leg pain/pain).    Marland Kitchen amLODipine (NORVASC) 10 MG tablet Take 1 tablet (10 mg total) by mouth at bedtime. 30 tablet 0  . aspirin EC 81 MG tablet Take 81 mg by mouth daily.    . carvedilol (COREG) 12.5 MG tablet TAKE 1 TABLET BY MOUTH TWO  TIMES DAILY 180 tablet 3  . Doxylamine Succinate, Sleep, (SLEEP AID PO) Take 1 tablet by mouth at bedtime as needed (for sleep).    . furosemide (LASIX) 40 MG tablet Take 1 tablet (40 mg total) by mouth 2 (two) times daily. 180 tablet 3  . hydrALAZINE (APRESOLINE) 50 MG tablet Take 25 mg by mouth 2 (two) times daily.    . Omega-3 Fatty Acids (FISH OIL) 1000 MG CAPS Take 2,000 mg by mouth daily.     . potassium chloride SA (KLOR-CON M20) 20 MEQ tablet Take 1 tablet (20 mEq total) by mouth daily. 90 tablet 3  . RaNITidine HCl (ACID REDUCER PO) Take 1 tablet by mouth daily as needed (for heartburn/indigestion).     No current facility-administered medications for this visit.      Past Medical History:  Diagnosis Date  . Arthritis    " all over the body"  . Coronary artery disease   . GERD (gastroesophageal reflux disease)   . Hyperlipidemia   . Hypertension   . Mobitz (type) II atrioventricular block    s/p STJ Accent pacemaker implanted by Dr [name] 10-16-2012  . Myocardial infarction [facility])    per pt., told that that the stress test shows that there is a weakening evident on the stress test  . Pacemaker 10/16/2012   St. Jude   . Personal history of colonic polyps-tubular adenomas  08/31/2013  . Vertigo     Past Surgical History:  Procedure Laterality Date  . cardiac bypass  1996   3 vessels  . CATARACT EXTRACTION W/ INTRAOCULAR LENS  IMPLANT, BILATERAL Bilateral 2010  . COLONOSCOPY    . CORONARY ARTERY BYPASS GRAFT    . INGUINAL HERNIA REPAIR Left 2010  . LUMBAR LAMINECTOMY/DECOMPRESSION MICRODISCECTOMY Right 07/19/2016   Procedure: Right Lumbar Four-Five Hemilaminectomy and Bilateral Lumbar Two-Three Laminectomy;  Surgeon: Eustace Moore, MD;  Location: [place];  Service: Neurosurgery;  Laterality: Right;  Right Lumbar Four-Five Hemilaminectomy and Bilateral Lumbar Two-Three Laminectomy  . LUMBAR SPINE SURGERY  2006  . NASAL SEPTUM SURGERY  09/01/13   Dr. Adriana Reams  . PACEMAKER INSERTION  10-16-2012   STJ Accent dual chamber pacemaker implanted by Dr [name] for symptomatic Mobitz II heart block  . PERMANENT PACEMAKER INSERTION N/A 10/16/2012   Procedure: PERMANENT PACEMAKER INSERTION;  Surgeon: Thompson Grayer, MD;  Location: [facility] CATH LAB;  Service: Cardiovascular;  Laterality: N/A;    Social History   Socioeconomic History  . Marital status: Widowed    Spouse name: Not on file  . Number of children: Not on file  . Years of education: Not on file  . Highest education level: Not on file  Social Needs  . Financial resource strain: Not on file  . Food insecurity - worry: Not on file  . Food insecurity - inability: Not on file  . Transportation needs - medical: Not on file  . Transportation needs - non-medical: Not on file  Occupational History  . Not on file  Tobacco Use  . Smoking status: Former Smoker    Packs/day: 2.00    Years: 40.00    Pack years: 80.00    Types: Cigarettes    Last attempt to quit: 04/02/1994    Years since quitting: 23.1  . Smokeless tobacco: Never Used  Substance and Sexual Activity  . Alcohol use: No    Alcohol/week: 0.0 oz    Comment: rare 01/01/14 occasionally- A  . Drug use: No  . Sexual activity: Yes  Other Topics Concern    . Not on file  Social History Narrative  . Not on file    Family History  Problem Relation Age of Onset  . CAD Father   . Hypertension Father   . CAD Mother   . Diabetes Mother   . Breast cancer Unknown        Niece  . Colon cancer Neg Hx   . Pancreatic cancer Neg Hx   . Stomach cancer Neg Hx     ROS: no fevers or chills, productive cough, hemoptysis, dysphasia, odynophagia, melena, hematochezia, dysuria, hematuria, rash, seizure activity, orthopnea, PND, pedal edema, claudication. Remaining systems are negative.  Physical Exam: Well-developed well-nourished in no acute distress.  Skin is warm and dry.  HEENT is normal.  Neck is supple.  Chest is clear to auscultation with normal expansion.  Cardiovascular exam is regular rate and rhythm.  Abdominal exam nontender or distended. No masses palpated. Extremities show no edema. neuro grossly intact  ECG- personally reviewed  A/P  1  Ricardo Ruths, MD

## 2017-05-28 ENCOUNTER — Ambulatory Visit: Payer: Medicare Other | Admitting: Cardiology

## 2017-05-29 ENCOUNTER — Encounter: Payer: Self-pay | Admitting: *Deleted

## 2017-10-21 ENCOUNTER — Ambulatory Visit (INDEPENDENT_AMBULATORY_CARE_PROVIDER_SITE_OTHER): Payer: Medicare Other | Admitting: *Deleted

## 2017-10-21 DIAGNOSIS — I442 Atrioventricular block, complete: Secondary | ICD-10-CM

## 2017-10-21 NOTE — Progress Notes (Signed)
Remote pacemaker transmission.   

## 2017-10-22 ENCOUNTER — Encounter: Payer: Self-pay | Admitting: Cardiology

## 2017-11-18 ENCOUNTER — Encounter: Payer: Medicare Other | Admitting: Internal Medicine

## 2017-11-18 DIAGNOSIS — R0989 Other specified symptoms and signs involving the circulatory and respiratory systems: Secondary | ICD-10-CM

## 2017-11-19 ENCOUNTER — Encounter: Payer: Self-pay | Admitting: Internal Medicine

## 2017-11-27 LAB — CUP PACEART REMOTE DEVICE CHECK
Battery Remaining Percentage: 95.5 %
Brady Statistic AP VP Percent: 71 %
Brady Statistic AP VS Percent: 1 %
Brady Statistic RA Percent Paced: 50 %
Brady Statistic RV Percent Paced: 83 %
Date Time Interrogation Session: 20190723060016
Implantable Lead Implant Date: 20140717
Implantable Lead Location: 753860
Implantable Lead Model: 1948
Implantable Pulse Generator Implant Date: 20140717
Lead Channel Impedance Value: 630 Ohm
Lead Channel Pacing Threshold Amplitude: 0.375 V
Lead Channel Sensing Intrinsic Amplitude: 3.6 mV
Lead Channel Setting Pacing Amplitude: 2 V
Lead Channel Setting Pacing Pulse Width: 0.4 ms
Lead Channel Setting Sensing Sensitivity: 4 mV
MDC IDC LEAD IMPLANT DT: 20140717
MDC IDC LEAD LOCATION: 753859
MDC IDC MSMT BATTERY REMAINING LONGEVITY: 115 mo
MDC IDC MSMT BATTERY VOLTAGE: 2.98 V
MDC IDC MSMT LEADCHNL RA IMPEDANCE VALUE: 410 Ohm
MDC IDC MSMT LEADCHNL RA PACING THRESHOLD AMPLITUDE: 1 V
MDC IDC MSMT LEADCHNL RA PACING THRESHOLD PULSEWIDTH: 0.4 ms
MDC IDC MSMT LEADCHNL RV PACING THRESHOLD PULSEWIDTH: 0.4 ms
MDC IDC MSMT LEADCHNL RV SENSING INTR AMPL: 12 mV
MDC IDC SET LEADCHNL RV PACING AMPLITUDE: 0.625
MDC IDC STAT BRADY AS VP PERCENT: 12 %
MDC IDC STAT BRADY AS VS PERCENT: 1.2 %
Pulse Gen Model: 2240
Pulse Gen Serial Number: 7519064

## 2017-12-19 ENCOUNTER — Other Ambulatory Visit: Payer: Self-pay | Admitting: Cardiovascular Disease

## 2017-12-30 ENCOUNTER — Other Ambulatory Visit: Payer: Self-pay | Admitting: Cardiovascular Disease

## 2018-01-20 ENCOUNTER — Ambulatory Visit (INDEPENDENT_AMBULATORY_CARE_PROVIDER_SITE_OTHER): Payer: Medicare Other | Admitting: *Deleted

## 2018-01-20 DIAGNOSIS — I442 Atrioventricular block, complete: Secondary | ICD-10-CM

## 2018-01-20 NOTE — Progress Notes (Signed)
Remote pacemaker transmission.   

## 2018-01-21 ENCOUNTER — Encounter: Payer: Self-pay | Admitting: Cardiology

## 2018-01-21 NOTE — Progress Notes (Signed)
Letter  

## 2018-01-22 ENCOUNTER — Telehealth: Payer: Self-pay | Admitting: Cardiovascular Disease

## 2018-01-22 NOTE — Telephone Encounter (Signed)
Numerous attempts to contact patient with recall letters. Unable to reach by telephone. with no success.  User: Time: StatusChanda Busing [[id number]] 06/28/2016 4:35 PM New [10]   [System] 08/31/2016 11:05 PM Notification Sent [20]   Laurine Blazer, LPN [[id number]] 07/03/7541 1:15 PM Scheduled/Linked [30]   [System] 12/29/2016 1:04 AM Notification Sent [20]   Chanda Busing [[id number]] 03/28/2017 10:48 AM Notification Sent [20]   Weston Anna [[id number]] 10/25/2017 12:24 PM Notification Sent [20]

## 2018-02-07 ENCOUNTER — Other Ambulatory Visit: Payer: Self-pay | Admitting: Cardiovascular Disease

## 2018-03-11 ENCOUNTER — Other Ambulatory Visit: Payer: Self-pay | Admitting: Cardiovascular Disease

## 2018-03-23 LAB — CUP PACEART REMOTE DEVICE CHECK
Battery Remaining Percentage: 95.5 %
Battery Voltage: 2.98 V
Brady Statistic AP VP Percent: 71 %
Brady Statistic RA Percent Paced: 52 %
Brady Statistic RV Percent Paced: 84 %
Implantable Lead Implant Date: 20140717
Implantable Lead Model: 1948
Implantable Pulse Generator Implant Date: 20140717
Lead Channel Impedance Value: 610 Ohm
Lead Channel Pacing Threshold Amplitude: 1 V
Lead Channel Pacing Threshold Pulse Width: 0.4 ms
Lead Channel Sensing Intrinsic Amplitude: 4.1 mV
Lead Channel Setting Pacing Pulse Width: 0.4 ms
Lead Channel Setting Sensing Sensitivity: 4 mV
MDC IDC LEAD IMPLANT DT: 20140717
MDC IDC LEAD LOCATION: 753859
MDC IDC LEAD LOCATION: 753860
MDC IDC MSMT BATTERY REMAINING LONGEVITY: 115 mo
MDC IDC MSMT LEADCHNL RA IMPEDANCE VALUE: 400 Ohm
MDC IDC MSMT LEADCHNL RV PACING THRESHOLD AMPLITUDE: 0.5 V
MDC IDC MSMT LEADCHNL RV PACING THRESHOLD PULSEWIDTH: 0.4 ms
MDC IDC MSMT LEADCHNL RV SENSING INTR AMPL: 12 mV
MDC IDC PG SERIAL: 7519064
MDC IDC SESS DTM: 20191021062526
MDC IDC SET LEADCHNL RA PACING AMPLITUDE: 2 V
MDC IDC SET LEADCHNL RV PACING AMPLITUDE: 0.75 V
MDC IDC STAT BRADY AP VS PERCENT: 1 %
MDC IDC STAT BRADY AS VP PERCENT: 13 %
MDC IDC STAT BRADY AS VS PERCENT: 1.2 %

## 2018-03-27 ENCOUNTER — Encounter: Payer: Self-pay | Admitting: Internal Medicine

## 2018-03-31 ENCOUNTER — Telehealth: Payer: Self-pay

## 2018-03-31 NOTE — Telephone Encounter (Signed)
LVM for pt to call device clinic back, pt needs apt with AF clinic to discuss [place].

## 2018-04-01 NOTE — Telephone Encounter (Signed)
LVM for return call. 

## 2018-04-03 NOTE — Telephone Encounter (Signed)
LVM for return call. 

## 2018-04-04 NOTE — Telephone Encounter (Signed)
LVM with pt's listed number and 2 number listed for his daughter on his DPR.

## 2018-04-07 ENCOUNTER — Encounter: Payer: Self-pay | Admitting: *Deleted

## 2018-04-07 NOTE — Telephone Encounter (Signed)
Certified letter mailed 04/07/18 requesting that patient contact the DC.

## 2018-04-11 NOTE — Telephone Encounter (Signed)
New message   [facility] is returning your call. Please return call

## 2018-04-14 NOTE — Telephone Encounter (Signed)
Spoke with Lattie Haw, patient's daughter. Explained remote transmission findings. She reports that patient has had some worsened ShOB in recent weeks, she is unsure how long, currently being treated by PCP with 7 day course of prednisone. She reports patient has not told her about any increased LE edema or other fluid retention symptoms. She is agreeable to scheduling overdue f/u with Dr. [name] in [place] on 05/02/18 at 3:30pm.  Encouraged Lattie Haw to have patient f/u with PCP if [facility] doesn't improve on prednisone, or if patient develops any symptoms of fluid retention. She verbalizes understanding and denies additional questions at this time.   Routed to Dr. [name] as Juluis Rainier.

## 2018-04-14 NOTE — Telephone Encounter (Signed)
-----   Message from Thompson Grayer, MD sent at 03/27/2018  4:34 PM EST ----- Remote device check reviewed.   Device report notable for:  AFib and atrial flutter. He is overdue to see me in the office. AF noted.  chads2vasc score is 4.  Should discuss anticoagulation upon follow-up.  I will alert Dr Bronson Ing also.

## 2018-04-21 ENCOUNTER — Ambulatory Visit (INDEPENDENT_AMBULATORY_CARE_PROVIDER_SITE_OTHER): Payer: Medicare Other

## 2018-04-21 DIAGNOSIS — I442 Atrioventricular block, complete: Secondary | ICD-10-CM

## 2018-04-22 LAB — CUP PACEART REMOTE DEVICE CHECK
Brady Statistic AP VS Percent: 1 %
Brady Statistic AS VP Percent: 14 %
Brady Statistic AS VS Percent: 1.1 %
Implantable Lead Implant Date: 20140717
Implantable Lead Location: 753859
Lead Channel Impedance Value: 430 Ohm
Lead Channel Pacing Threshold Amplitude: 0.5 V
Lead Channel Pacing Threshold Amplitude: 1 V
Lead Channel Pacing Threshold Pulse Width: 0.4 ms
Lead Channel Sensing Intrinsic Amplitude: 12 mV
Lead Channel Setting Pacing Pulse Width: 0.4 ms
Lead Channel Setting Sensing Sensitivity: 4 mV
MDC IDC LEAD IMPLANT DT: 20140717
MDC IDC LEAD LOCATION: 753860
MDC IDC MSMT BATTERY REMAINING LONGEVITY: 115 mo
MDC IDC MSMT BATTERY REMAINING PERCENTAGE: 95.5 %
MDC IDC MSMT BATTERY VOLTAGE: 2.98 V
MDC IDC MSMT LEADCHNL RA PACING THRESHOLD PULSEWIDTH: 0.4 ms
MDC IDC MSMT LEADCHNL RA SENSING INTR AMPL: 4 mV
MDC IDC MSMT LEADCHNL RV IMPEDANCE VALUE: 640 Ohm
MDC IDC PG IMPLANT DT: 20140717
MDC IDC SESS DTM: 20200120070013
MDC IDC SET LEADCHNL RA PACING AMPLITUDE: 2 V
MDC IDC SET LEADCHNL RV PACING AMPLITUDE: 0.75 V
MDC IDC STAT BRADY AP VP PERCENT: 71 %
MDC IDC STAT BRADY RA PERCENT PACED: 54 %
MDC IDC STAT BRADY RV PERCENT PACED: 85 %
Pulse Gen Model: 2240
Pulse Gen Serial Number: 7519064

## 2018-04-22 NOTE — Progress Notes (Signed)
Remote pacemaker transmission.   

## 2018-05-02 ENCOUNTER — Telehealth: Payer: Self-pay | Admitting: Internal Medicine

## 2018-05-02 ENCOUNTER — Encounter: Payer: Self-pay | Admitting: Internal Medicine

## 2018-05-02 ENCOUNTER — Ambulatory Visit (INDEPENDENT_AMBULATORY_CARE_PROVIDER_SITE_OTHER): Payer: Medicare Other | Admitting: Internal Medicine

## 2018-05-02 VITALS — BP 126/70 | HR 70 | Ht 68.0 in | Wt 186.6 lb

## 2018-05-02 DIAGNOSIS — Z950 Presence of cardiac pacemaker: Secondary | ICD-10-CM | POA: Diagnosis not present

## 2018-05-02 DIAGNOSIS — I43 Cardiomyopathy in diseases classified elsewhere: Secondary | ICD-10-CM

## 2018-05-02 DIAGNOSIS — I2510 Atherosclerotic heart disease of native coronary artery without angina pectoris: Secondary | ICD-10-CM

## 2018-05-02 DIAGNOSIS — I442 Atrioventricular block, complete: Secondary | ICD-10-CM

## 2018-05-02 DIAGNOSIS — I5022 Chronic systolic (congestive) heart failure: Secondary | ICD-10-CM | POA: Diagnosis not present

## 2018-05-02 DIAGNOSIS — Z01812 Encounter for preprocedural laboratory examination: Secondary | ICD-10-CM

## 2018-05-02 DIAGNOSIS — I10 Essential (primary) hypertension: Secondary | ICD-10-CM

## 2018-05-02 DIAGNOSIS — R0602 Shortness of breath: Secondary | ICD-10-CM

## 2018-05-02 MED ORDER — SACUBITRIL-VALSARTAN 24-26 MG PO TABS: 1.0000 | ORAL_TABLET | Freq: Two times a day (BID) | ORAL | 0 refills | Status: DC

## 2018-05-02 MED ORDER — RIVAROXABAN 20 MG PO TABS: 20.0000 mg | ORAL_TABLET | Freq: Every day | ORAL | 0 refills | Status: DC

## 2018-05-02 NOTE — H&P (View-Only) (Signed)
PCP: Dione Housekeeper, MD Primary Cardiologist: Dr Bronson Ing Primary EP:  Dr [name]  Ricardo Hunt is a 78 y.o. male who presents today for routine electrophysiology followup.  He has not seen me since 2018.  He also has not been compliant with follow-up with Dr Bronson Ing.  Over the past year, he has had worsening issues with SOB and chest discomfort.  Symptoms are exertional.  He reports "I cant do anything anymore".  He does not smoke (Quit 1996 after bypass surgery).  On last visit, it was noted that his EF has declined substantially over a 2 year period.  Today, he denies symptoms of palpitations, chest pain, lower extremity edema, dizziness, presyncope, or syncope.  The patient is otherwise without complaint today.   Past Medical History:  Diagnosis Date  . Arthritis    " all over the body"  . Atrial fibrillation and flutter ([place])    seen on PPM interrogation  . Coronary artery disease   . GERD (gastroesophageal reflux disease)   . Hyperlipidemia   . Hypertension   . Mobitz (type) II atrioventricular block    s/p STJ Accent pacemaker implanted by Dr [name] 10-16-2012  . Myocardial infarction [facility])    per pt., told that that the stress test shows that there is a weakening evident on the stress test  . Pacemaker 10/16/2012   St. Jude   . Personal history of colonic polyps-tubular adenomas 08/31/2013  . Vertigo    Past Surgical History:  Procedure Laterality Date  . cardiac bypass  1996   3 vessels  . CATARACT EXTRACTION W/ INTRAOCULAR LENS  IMPLANT, BILATERAL Bilateral 2010  . COLONOSCOPY    . CORONARY ARTERY BYPASS GRAFT    . INGUINAL HERNIA REPAIR Left 2010  . LUMBAR LAMINECTOMY/DECOMPRESSION MICRODISCECTOMY Right 07/19/2016   Procedure: Right Lumbar Four-Five Hemilaminectomy and Bilateral Lumbar Two-Three Laminectomy;  Surgeon: Eustace Moore, MD;  Location: [place];  Service: Neurosurgery;  Laterality: Right;  Right Lumbar Four-Five Hemilaminectomy and Bilateral Lumbar  Two-Three Laminectomy  . LUMBAR SPINE SURGERY  2006  . NASAL SEPTUM SURGERY  09/01/13   Dr. Adriana Reams  . PACEMAKER INSERTION  10-16-2012   STJ Accent dual chamber pacemaker implanted by Dr [name] for symptomatic Mobitz II heart block  . PERMANENT PACEMAKER INSERTION N/A 10/16/2012   Procedure: PERMANENT PACEMAKER INSERTION;  Surgeon: Thompson Grayer, MD;  Location: [facility] CATH LAB;  Service: Cardiovascular;  Laterality: N/A;    ROS- all systems are reviewed and negative except as per HPI above  Current Outpatient Medications  Medication Sig Dispense Refill  . acetaminophen (TYLENOL) 500 MG tablet Take 1,000 mg by mouth every 6 (six) hours as needed (for leg pain/pain).    Marland Kitchen amLODipine (NORVASC) 10 MG tablet Take 1 tablet (10 mg total) by mouth at bedtime. 30 tablet 0  . aspirin EC 81 MG tablet Take 81 mg by mouth daily.    . carvedilol (COREG) 12.5 MG tablet TAKE 1 TABLET BY MOUTH TWO  TIMES DAILY 180 tablet 3  . Doxylamine Succinate, Sleep, (SLEEP AID PO) Take 1 tablet by mouth at bedtime as needed (for sleep).    . furosemide (LASIX) 40 MG tablet Take 1 tablet (40 mg total) by mouth 2 (two) times daily. 180 tablet 0  . hydrALAZINE (APRESOLINE) 50 MG tablet Take 25 mg by mouth 2 (two) times daily.    . Omega-3 Fatty Acids (FISH OIL) 1000 MG CAPS Take 2,000 mg by mouth daily.     Marland Kitchen  potassium chloride SA (K-DUR,KLOR-CON) 20 MEQ tablet Take 1 tablet (20 mEq total) by mouth daily. 7 tablet 0  . RaNITidine HCl (ACID REDUCER PO) Take 1 tablet by mouth daily as needed (for heartburn/indigestion).     No current facility-administered medications for this visit.     Physical Exam: Vitals:   05/02/18 1522  BP: 126/70  Pulse: 70  SpO2: 95%  Weight: 186 lb 9.6 oz (84.6 kg)  Height: 5\' 8"  (1.727 m)    GEN- The patient is well appearing, alert and oriented x 3 today.   Head- normocephalic, atraumatic Eyes-  Sclera clear, conjunctiva pink Ears- hearing intact Oropharynx- clear Lungs- Clear to  ausculation bilaterally, normal work of breathing Chest- pacemaker pocket is well healed Heart- Regular rate and rhythm, no murmurs, rubs or gallops, PMI not laterally displaced GI- soft, NT, ND, + BS Extremities- no clubbing, cyanosis, or edema  Pacemaker interrogation- reviewed in detail today,  See PACEART report   Assessment and Plan:  1. Symptomatic complete heart block Normal pacemaker function See Pace Art report Rate response turned on today due to shifted histogram  2. CAD/ ischemic CM/ chronic systolic dysfunction He has become progressive symptomatic with dramatic decline in his EF.  I worry about worsening ischemia.  I have advise right and left heart catheterization to further evaluate his symptoms. Risks and benefits of RHC and LHC with possible PCI were discussed at length with the patient who wishes to proceed.  We will schedule cath at the next available time. Start entresto today.  Follow-up with Dr Bronson Ing in the next few weeks for medicine titration. Return to see me in 3 months.  If still having issues with CHF, we could consider upgrade to CRT-D if EF remains depressed despite medicine optimization.  3. HTN Stable No change required today  4. Persistent atrial fibrillation Increased burden over the past year by remote transmissions chads2vasc score is at least 5. Stop ASA and start xarelto for stroke prevention Hold xarelto 48 hours prior to cath  Overdue to see Dr Bronson Ing.  I would like for him to follow up in the near future for medicine titration post cath.  Return to see me in 3 months to further consider CRT-D upgrade if still having CHF issues at that time.  Thompson Grayer MD, [facility] 05/02/2018 3:30 PM

## 2018-05-02 NOTE — Progress Notes (Signed)
PCP: Dione Housekeeper, MD Primary Cardiologist: Dr Bronson Ing Primary EP:  Dr [name]  Ricardo Hunt is a 78 y.o. male who presents today for routine electrophysiology followup.  He has not seen me since 2018.  He also has not been compliant with follow-up with Dr Bronson Ing.  Over the past year, he has had worsening issues with SOB and chest discomfort.  Symptoms are exertional.  He reports "I cant do anything anymore".  He does not smoke (Quit 1996 after bypass surgery).  On last visit, it was noted that his EF has declined substantially over a 2 year period.  Today, he denies symptoms of palpitations, chest pain, lower extremity edema, dizziness, presyncope, or syncope.  The patient is otherwise without complaint today.   Past Medical History:  Diagnosis Date  . Arthritis    " all over the body"  . Atrial fibrillation and flutter ([place])    seen on PPM interrogation  . Coronary artery disease   . GERD (gastroesophageal reflux disease)   . Hyperlipidemia   . Hypertension   . Mobitz (type) II atrioventricular block    s/p STJ Accent pacemaker implanted by Dr [name] 10-16-2012  . Myocardial infarction [facility])    per pt., told that that the stress test shows that there is a weakening evident on the stress test  . Pacemaker 10/16/2012   St. Jude   . Personal history of colonic polyps-tubular adenomas 08/31/2013  . Vertigo    Past Surgical History:  Procedure Laterality Date  . cardiac bypass  1996   3 vessels  . CATARACT EXTRACTION W/ INTRAOCULAR LENS  IMPLANT, BILATERAL Bilateral 2010  . COLONOSCOPY    . CORONARY ARTERY BYPASS GRAFT    . INGUINAL HERNIA REPAIR Left 2010  . LUMBAR LAMINECTOMY/DECOMPRESSION MICRODISCECTOMY Right 07/19/2016   Procedure: Right Lumbar Four-Five Hemilaminectomy and Bilateral Lumbar Two-Three Laminectomy;  Surgeon: Eustace Moore, MD;  Location: [place];  Service: Neurosurgery;  Laterality: Right;  Right Lumbar Four-Five Hemilaminectomy and Bilateral Lumbar  Two-Three Laminectomy  . LUMBAR SPINE SURGERY  2006  . NASAL SEPTUM SURGERY  09/01/13   Dr. Adriana Reams  . PACEMAKER INSERTION  10-16-2012   STJ Accent dual chamber pacemaker implanted by Dr [name] for symptomatic Mobitz II heart block  . PERMANENT PACEMAKER INSERTION N/A 10/16/2012   Procedure: PERMANENT PACEMAKER INSERTION;  Surgeon: Thompson Grayer, MD;  Location: [facility] CATH LAB;  Service: Cardiovascular;  Laterality: N/A;    ROS- all systems are reviewed and negative except as per HPI above  Current Outpatient Medications  Medication Sig Dispense Refill  . acetaminophen (TYLENOL) 500 MG tablet Take 1,000 mg by mouth every 6 (six) hours as needed (for leg pain/pain).    Marland Kitchen amLODipine (NORVASC) 10 MG tablet Take 1 tablet (10 mg total) by mouth at bedtime. 30 tablet 0  . aspirin EC 81 MG tablet Take 81 mg by mouth daily.    . carvedilol (COREG) 12.5 MG tablet TAKE 1 TABLET BY MOUTH TWO  TIMES DAILY 180 tablet 3  . Doxylamine Succinate, Sleep, (SLEEP AID PO) Take 1 tablet by mouth at bedtime as needed (for sleep).    . furosemide (LASIX) 40 MG tablet Take 1 tablet (40 mg total) by mouth 2 (two) times daily. 180 tablet 0  . hydrALAZINE (APRESOLINE) 50 MG tablet Take 25 mg by mouth 2 (two) times daily.    . Omega-3 Fatty Acids (FISH OIL) 1000 MG CAPS Take 2,000 mg by mouth daily.     Marland Kitchen  potassium chloride SA (K-DUR,KLOR-CON) 20 MEQ tablet Take 1 tablet (20 mEq total) by mouth daily. 7 tablet 0  . RaNITidine HCl (ACID REDUCER PO) Take 1 tablet by mouth daily as needed (for heartburn/indigestion).     No current facility-administered medications for this visit.     Physical Exam: Vitals:   05/02/18 1522  BP: 126/70  Pulse: 70  SpO2: 95%  Weight: 186 lb 9.6 oz (84.6 kg)  Height: 5\' 8"  (1.727 m)    GEN- The patient is well appearing, alert and oriented x 3 today.   Head- normocephalic, atraumatic Eyes-  Sclera clear, conjunctiva pink Ears- hearing intact Oropharynx- clear Lungs- Clear to  ausculation bilaterally, normal work of breathing Chest- pacemaker pocket is well healed Heart- Regular rate and rhythm, no murmurs, rubs or gallops, PMI not laterally displaced GI- soft, NT, ND, + BS Extremities- no clubbing, cyanosis, or edema  Pacemaker interrogation- reviewed in detail today,  See PACEART report   Assessment and Plan:  1. Symptomatic complete heart block Normal pacemaker function See Pace Art report Rate response turned on today due to shifted histogram  2. CAD/ ischemic CM/ chronic systolic dysfunction He has become progressive symptomatic with dramatic decline in his EF.  I worry about worsening ischemia.  I have advise right and left heart catheterization to further evaluate his symptoms. Risks and benefits of RHC and LHC with possible PCI were discussed at length with the patient who wishes to proceed.  We will schedule cath at the next available time. Start entresto today.  Follow-up with Dr Bronson Ing in the next few weeks for medicine titration. Return to see me in 3 months.  If still having issues with CHF, we could consider upgrade to CRT-D if EF remains depressed despite medicine optimization.  3. HTN Stable No change required today  4. Persistent atrial fibrillation Increased burden over the past year by remote transmissions chads2vasc score is at least 5. Stop ASA and start xarelto for stroke prevention Hold xarelto 48 hours prior to cath  Overdue to see Dr Bronson Ing.  I would like for him to follow up in the near future for medicine titration post cath.  Return to see me in 3 months to further consider CRT-D upgrade if still having CHF issues at that time.  Thompson Grayer MD, [facility] 05/02/2018 3:30 PM

## 2018-05-02 NOTE — Patient Instructions (Addendum)
Medication Instructions:   Stop Aspirin.   Begin Xarelto 20mg  daily.   Begin Entresto 24/26mg  twice a day.  Continue all other medications.    Labwork: BMET, CBC - orders given today.   Testing/Procedures: Your physician has requested that you have a cardiac catheterization. Cardiac catheterization is used to diagnose and/or treat various heart conditions. Doctors may recommend this procedure for a number of different reasons. The most common reason is to evaluate chest pain. Chest pain can be a symptom of coronary artery disease (CAD), and cardiac catheterization can show whether plaque is narrowing or blocking your heart's arteries. This procedure is also used to evaluate the valves, as well as measure the blood flow and oxygen levels in different parts of your heart. For further information please visit [email or web address]. Please follow instruction sheet, as given.  Follow-Up:  Dr. [name] - 3 months   Dr. Bronson Ing - 2 weeks for post cath  Any Other Special Instructions Will Be Listed Below (If Applicable).  If you need a refill on your cardiac medications before your next appointment, please call your pharmacy.  =====================================================     [address] [place] [address] Dept: [phone] Loc: [phone]  Ricardo Hunt  05/02/2018  You are scheduled for a Cardiac Catheterization on Wednesday, February 12 with Dr. Peter [name] at 8:30 am.  1. Please arrive at the [facility] (Main Entrance A) at [facility]: [place], [place] [address] at 6:30 AM (This time is two hours before your procedure to ensure your preparation). Free valet parking service is available.   Special note: Every effort is made to have your procedure done on time. Please understand that emergencies sometimes delay scheduled  procedures.  2. Diet: Do not eat solid foods after midnight.  The patient may have clear liquids until 5am upon the day of the procedure.  3. Labs: You will need to have blood drawn on Friday, February 7 at [place]. [address], [place]  Open: 7am - 6pm, Sat 8am - 12 noon   Phone: [phone]. You do not need to be fasting.  4. Medication instructions in preparation for your procedure:   Contrast Allergy: No  Please hold you Xarelto 48 hours prior to procedure.    Please hold your Lasix the morning of procedure only.  On the morning of your procedure, take your Aspirin and any morning medicines NOT listed above.  You may use sips of water.  5. Plan for one night stay--bring personal belongings. 6. Bring a current list of your medications and current insurance cards. 7. You MUST have a responsible person to drive you home. 8. Someone MUST be with you the first 24 hours after you arrive home or your discharge will be delayed. 9. Please wear clothes that are easy to get on and off and wear slip-on shoes.  Thank you for allowing [place] to care for you!   -- [facility]

## 2018-05-02 NOTE — Telephone Encounter (Signed)
Pre-cert Verification for the following procedure    L & R heart cath - 05/14/18 - 8:30 - [name]

## 2018-05-09 ENCOUNTER — Other Ambulatory Visit: Payer: Self-pay | Admitting: Internal Medicine

## 2018-05-09 LAB — CBC
HCT: 46.3 % (ref 38.5–50.0)
Hemoglobin: 16.1 g/dL (ref 13.2–17.1)
MCH: 32.6 pg (ref 27.0–33.0)
MCHC: 34.8 g/dL (ref 32.0–36.0)
MCV: 93.7 fL (ref 80.0–100.0)
MPV: 11 fL (ref 7.5–12.5)
PLATELETS: 207 10*3/uL (ref 140–400)
RBC: 4.94 10*6/uL (ref 4.20–5.80)
RDW: 14.2 % (ref 11.0–15.0)
WBC: 8.9 10*3/uL (ref 3.8–10.8)

## 2018-05-09 LAB — BASIC METABOLIC PANEL
BUN / CREAT RATIO: 16 (calc) (ref 6–22)
BUN: 21 mg/dL (ref 7–25)
CALCIUM: 9.9 mg/dL (ref 8.6–10.3)
CHLORIDE: 105 mmol/L (ref 98–110)
CO2: 28 mmol/L (ref 20–32)
Creat: 1.29 mg/dL — ABNORMAL HIGH (ref 0.70–1.18)
GLUCOSE: 105 mg/dL (ref 65–139)
Potassium: 4.4 mmol/L (ref 3.5–5.3)
Sodium: 142 mmol/L (ref 135–146)

## 2018-05-13 ENCOUNTER — Telehealth: Payer: Self-pay | Admitting: *Deleted

## 2018-05-13 NOTE — Telephone Encounter (Addendum)
Per daughter, patient took xarelto every day and has not held it for heart cath as instructed. Advised that heart cath would have to be rescheduled. Heart Cath rescheduled for Tuesday, May 20, 2018 @12 :00 noon with Dr. [name]. Daughter aware to hold xarelto 48 hour prior starting Sunday. Verbalized understanding.

## 2018-05-13 NOTE — Telephone Encounter (Signed)
LMTCB to review instructions 

## 2018-05-13 NOTE — Telephone Encounter (Signed)
Pt contacted pre-catheterization scheduled at [facility] for: Wednesday May 14, 2018 8:30 AM Verified arrival time and place: [place] Entrance A at: 6:30 AM  No solid food after midnight prior to cath, clear liquids until 5 AM day of procedure. Contrast allergy  Hold: Xarelto-48 hours before procedure. Furosemide-AM of procedure. KCl -AM of procedure.  Except hold medications AM meds can be  taken pre-cath with sip of water including: ASA 81 mg  Confirm patient has responsible person to drive home post procedure and observe 24 hours after arriving home.  Encourage hydration with water.  LMTCB to review instructions with patient.

## 2018-05-13 NOTE — Telephone Encounter (Signed)
See other phone note today, cath rescheduled.

## 2018-05-19 ENCOUNTER — Telehealth: Payer: Self-pay | Admitting: *Deleted

## 2018-05-19 NOTE — Telephone Encounter (Signed)
Pt contacted pre-catheterization scheduled at [facility] for: Tuesday May 20, 2018 10:30 AM Verified arrival time and place: [place] Entrance A at: 8:30 AM  No solid food after midnight prior to cath, clear liquids until 5 AM day of procedure.  Hold: Xarelto-last dose 05/16/18 until post procedure. Furosemide-AM of procedure. KCl- AM of procedure.  Except hold medications AM meds can be  taken pre-cath with sip of water including: ASA 81 mg  Confirmed patient has responsible person to drive home post procedure and observe 24 hours after arriving home: yes  I encouraged Lattie Haw to ask patient to increase water intake today.   I reviewed instructions with patient's daughter, Lattie Haw [facility]), she verbalized understanding, thanked me for call.

## 2018-05-20 ENCOUNTER — Inpatient Hospital Stay (HOSPITAL_COMMUNITY)
Admission: AD | Admit: 2018-05-20 | Discharge: 2018-05-25 | DRG: 246 | Disposition: A | Discharge status: Home Health | Payer: Medicare Other | Attending: Cardiology | Admitting: Cardiology

## 2018-05-20 ENCOUNTER — Encounter (HOSPITAL_COMMUNITY): Payer: Self-pay | Admitting: *Deleted

## 2018-05-20 ENCOUNTER — Encounter (HOSPITAL_COMMUNITY)
Admission: AD | Disposition: A | Discharge status: Home Health | Payer: Self-pay | Source: Home / Self Care | Attending: Cardiology

## 2018-05-20 ENCOUNTER — Other Ambulatory Visit: Payer: Self-pay

## 2018-05-20 DIAGNOSIS — R0609 Other forms of dyspnea: Secondary | ICD-10-CM | POA: Diagnosis not present

## 2018-05-20 DIAGNOSIS — Z955 Presence of coronary angioplasty implant and graft: Secondary | ICD-10-CM

## 2018-05-20 DIAGNOSIS — Z951 Presence of aortocoronary bypass graft: Secondary | ICD-10-CM | POA: Diagnosis not present

## 2018-05-20 DIAGNOSIS — M159 Polyosteoarthritis, unspecified: Secondary | ICD-10-CM | POA: Diagnosis present

## 2018-05-20 DIAGNOSIS — Z7982 Long term (current) use of aspirin: Secondary | ICD-10-CM | POA: Diagnosis not present

## 2018-05-20 DIAGNOSIS — I4819 Other persistent atrial fibrillation: Secondary | ICD-10-CM | POA: Diagnosis present

## 2018-05-20 DIAGNOSIS — I441 Atrioventricular block, second degree: Secondary | ICD-10-CM | POA: Diagnosis present

## 2018-05-20 DIAGNOSIS — Z950 Presence of cardiac pacemaker: Secondary | ICD-10-CM | POA: Diagnosis not present

## 2018-05-20 DIAGNOSIS — K219 Gastro-esophageal reflux disease without esophagitis: Secondary | ICD-10-CM | POA: Diagnosis present

## 2018-05-20 DIAGNOSIS — E785 Hyperlipidemia, unspecified: Secondary | ICD-10-CM | POA: Diagnosis present

## 2018-05-20 DIAGNOSIS — Z833 Family history of diabetes mellitus: Secondary | ICD-10-CM

## 2018-05-20 DIAGNOSIS — R079 Chest pain, unspecified: Secondary | ICD-10-CM | POA: Diagnosis present

## 2018-05-20 DIAGNOSIS — I255 Ischemic cardiomyopathy: Secondary | ICD-10-CM | POA: Diagnosis present

## 2018-05-20 DIAGNOSIS — I25119 Atherosclerotic heart disease of native coronary artery with unspecified angina pectoris: Principal | ICD-10-CM | POA: Diagnosis present

## 2018-05-20 DIAGNOSIS — I2510 Atherosclerotic heart disease of native coronary artery without angina pectoris: Secondary | ICD-10-CM | POA: Diagnosis not present

## 2018-05-20 DIAGNOSIS — I10 Essential (primary) hypertension: Secondary | ICD-10-CM | POA: Diagnosis present

## 2018-05-20 DIAGNOSIS — I110 Hypertensive heart disease with heart failure: Secondary | ICD-10-CM | POA: Diagnosis present

## 2018-05-20 DIAGNOSIS — I361 Nonrheumatic tricuspid (valve) insufficiency: Secondary | ICD-10-CM | POA: Diagnosis not present

## 2018-05-20 DIAGNOSIS — N179 Acute kidney failure, unspecified: Secondary | ICD-10-CM | POA: Diagnosis not present

## 2018-05-20 DIAGNOSIS — I25110 Atherosclerotic heart disease of native coronary artery with unstable angina pectoris: Secondary | ICD-10-CM | POA: Diagnosis not present

## 2018-05-20 DIAGNOSIS — I442 Atrioventricular block, complete: Secondary | ICD-10-CM | POA: Diagnosis present

## 2018-05-20 DIAGNOSIS — I272 Pulmonary hypertension, unspecified: Secondary | ICD-10-CM | POA: Diagnosis present

## 2018-05-20 DIAGNOSIS — Z87891 Personal history of nicotine dependence: Secondary | ICD-10-CM | POA: Diagnosis not present

## 2018-05-20 DIAGNOSIS — I371 Nonrheumatic pulmonary valve insufficiency: Secondary | ICD-10-CM | POA: Diagnosis present

## 2018-05-20 DIAGNOSIS — I5023 Acute on chronic systolic (congestive) heart failure: Secondary | ICD-10-CM | POA: Diagnosis present

## 2018-05-20 DIAGNOSIS — Z8249 Family history of ischemic heart disease and other diseases of the circulatory system: Secondary | ICD-10-CM | POA: Diagnosis not present

## 2018-05-20 DIAGNOSIS — I5022 Chronic systolic (congestive) heart failure: Secondary | ICD-10-CM | POA: Insufficient documentation

## 2018-05-20 DIAGNOSIS — I4892 Unspecified atrial flutter: Secondary | ICD-10-CM | POA: Diagnosis present

## 2018-05-20 DIAGNOSIS — I252 Old myocardial infarction: Secondary | ICD-10-CM

## 2018-05-20 DIAGNOSIS — Z79899 Other long term (current) drug therapy: Secondary | ICD-10-CM

## 2018-05-20 HISTORY — DX: Chronic systolic (congestive) heart failure: I50.22

## 2018-05-20 HISTORY — PX: RIGHT/LEFT HEART CATH AND CORONARY/GRAFT ANGIOGRAPHY: CATH118267

## 2018-05-20 LAB — POCT I-STAT EG7
Acid-Base Excess: 1 mmol/L (ref 0.0–2.0)
Bicarbonate: 27.4 mmol/L (ref 20.0–28.0)
Calcium, Ion: 1.22 mmol/L (ref 1.15–1.40)
HCT: 41 % (ref 39.0–52.0)
Hemoglobin: 13.9 g/dL (ref 13.0–17.0)
O2 Saturation: 70 %
POTASSIUM: 4.1 mmol/L (ref 3.5–5.1)
Sodium: 141 mmol/L (ref 135–145)
TCO2: 29 mmol/L (ref 22–32)
pCO2, Ven: 47.7 mmHg (ref 44.0–60.0)
pH, Ven: 7.368 (ref 7.250–7.430)
pO2, Ven: 38 mmHg (ref 32.0–45.0)

## 2018-05-20 LAB — POCT I-STAT 7, (LYTES, BLD GAS, ICA,H+H)
BICARBONATE: 25.6 mmol/L (ref 20.0–28.0)
Calcium, Ion: 1.21 mmol/L (ref 1.15–1.40)
HCT: 40 % (ref 39.0–52.0)
Hemoglobin: 13.6 g/dL (ref 13.0–17.0)
O2 Saturation: 96 %
Potassium: 4.1 mmol/L (ref 3.5–5.1)
Sodium: 141 mmol/L (ref 135–145)
TCO2: 27 mmol/L (ref 22–32)
pCO2 arterial: 43.8 mmHg (ref 32.0–48.0)
pH, Arterial: 7.375 (ref 7.350–7.450)
pO2, Arterial: 85 mmHg (ref 83.0–108.0)

## 2018-05-20 LAB — BRAIN NATRIURETIC PEPTIDE: B Natriuretic Peptide: 390.6 pg/mL — ABNORMAL HIGH (ref 0.0–100.0)

## 2018-05-20 LAB — MRSA PCR SCREENING: MRSA by PCR: NEGATIVE

## 2018-05-20 SURGERY — RIGHT/LEFT HEART CATH AND CORONARY/GRAFT ANGIOGRAPHY
Anesthesia: LOCAL

## 2018-05-20 MED ORDER — HYDRALAZINE HCL 25 MG PO TABS: 25.0000 mg | ORAL_TABLET | Freq: Two times a day (BID) | ORAL | Status: DC

## 2018-05-20 MED ORDER — CLOPIDOGREL BISULFATE 75 MG PO TABS
75.0000 mg | ORAL_TABLET | Freq: Every day | ORAL | Status: DC
  Administered 2018-05-21 – 2018-05-25 (×5): 75 mg via ORAL
  Filled 2018-05-20 (×6): qty 1

## 2018-05-20 MED ORDER — FENTANYL CITRATE (PF) 100 MCG/2ML IJ SOLN
INTRAMUSCULAR | Status: DC | PRN
  Administered 2018-05-20: 25 ug via INTRAVENOUS

## 2018-05-20 MED ORDER — SACUBITRIL-VALSARTAN 49-51 MG PO TABS
1.0000 | ORAL_TABLET | Freq: Two times a day (BID) | ORAL | Status: DC
  Administered 2018-05-20 – 2018-05-22 (×5): 1 via ORAL
  Filled 2018-05-20 (×7): qty 1

## 2018-05-20 MED ORDER — AMLODIPINE BESYLATE 10 MG PO TABS: 10.0000 mg | ORAL_TABLET | Freq: Every day | ORAL | Status: DC

## 2018-05-20 MED ORDER — CARVEDILOL 12.5 MG PO TABS
12.5000 mg | ORAL_TABLET | Freq: Two times a day (BID) | ORAL | Status: DC
  Administered 2018-05-20 – 2018-05-25 (×9): 12.5 mg via ORAL
  Filled 2018-05-20 (×10): qty 1

## 2018-05-20 MED ORDER — ROSUVASTATIN CALCIUM 20 MG PO TABS
20.0000 mg | ORAL_TABLET | Freq: Every day | ORAL | Status: DC
  Administered 2018-05-20 – 2018-05-25 (×6): 20 mg via ORAL
  Filled 2018-05-20 (×6): qty 1

## 2018-05-20 MED ORDER — CLOPIDOGREL BISULFATE 75 MG PO TABS
600.0000 mg | ORAL_TABLET | Freq: Once | ORAL | Status: AC
  Administered 2018-05-20: 600 mg via ORAL
  Filled 2018-05-20: qty 8

## 2018-05-20 MED ORDER — HEPARIN SODIUM (PORCINE) 1000 UNIT/ML IJ SOLN
INTRAMUSCULAR | Status: DC | PRN
  Administered 2018-05-20: 4500 [IU] via INTRAVENOUS

## 2018-05-20 MED ORDER — SODIUM CHLORIDE 0.9 % IV SOLN: 250.0000 mL | INTRAVENOUS | Status: DC | PRN

## 2018-05-20 MED ORDER — VERAPAMIL HCL 2.5 MG/ML IV SOLN
INTRAVENOUS | Status: AC
  Filled 2018-05-20: qty 2

## 2018-05-20 MED ORDER — ENOXAPARIN SODIUM 40 MG/0.4ML ~~LOC~~ SOLN
40.0000 mg | SUBCUTANEOUS | Status: DC
  Administered 2018-05-21 – 2018-05-23 (×3): 40 mg via SUBCUTANEOUS
  Filled 2018-05-20 (×3): qty 0.4

## 2018-05-20 MED ORDER — ASPIRIN 81 MG PO CHEW
81.0000 mg | CHEWABLE_TABLET | Freq: Once | ORAL | Status: AC
  Administered 2018-05-20: 81 mg via ORAL
  Filled 2018-05-20: qty 1

## 2018-05-20 MED ORDER — SODIUM CHLORIDE 0.9% FLUSH: 3.0000 mL | INTRAVENOUS | Status: DC | PRN

## 2018-05-20 MED ORDER — FENTANYL CITRATE (PF) 100 MCG/2ML IJ SOLN
INTRAMUSCULAR | Status: AC
  Filled 2018-05-20: qty 2

## 2018-05-20 MED ORDER — SACUBITRIL-VALSARTAN 24-26 MG PO TABS: 1.0000 | ORAL_TABLET | Freq: Two times a day (BID) | ORAL | Status: DC

## 2018-05-20 MED ORDER — VERAPAMIL HCL 2.5 MG/ML IV SOLN
INTRAVENOUS | Status: DC | PRN
  Administered 2018-05-20: 10 mL via INTRA_ARTERIAL

## 2018-05-20 MED ORDER — FUROSEMIDE 10 MG/ML IJ SOLN: 40.0000 mg | Freq: Two times a day (BID) | INTRAMUSCULAR | Status: DC

## 2018-05-20 MED ORDER — OMEGA-3-ACID ETHYL ESTERS 1 G PO CAPS
1.0000 g | ORAL_CAPSULE | Freq: Every day | ORAL | Status: DC
  Administered 2018-05-21 – 2018-05-25 (×5): 1 g via ORAL
  Filled 2018-05-20 (×6): qty 1

## 2018-05-20 MED ORDER — LIDOCAINE HCL (PF) 1 % IJ SOLN
INTRAMUSCULAR | Status: AC
  Filled 2018-05-20: qty 30

## 2018-05-20 MED ORDER — FUROSEMIDE 10 MG/ML IJ SOLN
80.0000 mg | Freq: Two times a day (BID) | INTRAMUSCULAR | Status: DC
  Administered 2018-05-20 – 2018-05-21 (×3): 80 mg via INTRAVENOUS
  Filled 2018-05-20 (×3): qty 8

## 2018-05-20 MED ORDER — ACETAMINOPHEN 325 MG PO TABS
650.0000 mg | ORAL_TABLET | ORAL | Status: DC | PRN
  Administered 2018-05-21 – 2018-05-24 (×7): 650 mg via ORAL
  Filled 2018-05-20 (×7): qty 2

## 2018-05-20 MED ORDER — SODIUM CHLORIDE 0.9% FLUSH
3.0000 mL | Freq: Two times a day (BID) | INTRAVENOUS | Status: DC
  Administered 2018-05-20 – 2018-05-25 (×6): 3 mL via INTRAVENOUS

## 2018-05-20 MED ORDER — ASPIRIN 81 MG PO CHEW: 81.0000 mg | CHEWABLE_TABLET | ORAL | Status: DC

## 2018-05-20 MED ORDER — LIDOCAINE HCL (PF) 1 % IJ SOLN
INTRAMUSCULAR | Status: DC | PRN
  Administered 2018-05-20 (×2): 2 mL

## 2018-05-20 MED ORDER — SODIUM CHLORIDE 0.9 % IV SOLN
INTRAVENOUS | Status: DC
  Administered 2018-05-20: 10:00:00 via INTRAVENOUS

## 2018-05-20 MED ORDER — POTASSIUM CHLORIDE CRYS ER 20 MEQ PO TBCR
20.0000 meq | EXTENDED_RELEASE_TABLET | Freq: Every day | ORAL | Status: DC
  Administered 2018-05-21 – 2018-05-25 (×5): 20 meq via ORAL
  Filled 2018-05-20 (×6): qty 1

## 2018-05-20 MED ORDER — IOHEXOL 350 MG/ML SOLN
INTRAVENOUS | Status: DC | PRN
  Administered 2018-05-20: 100 mL via INTRAVENOUS

## 2018-05-20 MED ORDER — MIDAZOLAM HCL 2 MG/2ML IJ SOLN
INTRAMUSCULAR | Status: AC
  Filled 2018-05-20: qty 2

## 2018-05-20 MED ORDER — HEPARIN (PORCINE) IN NACL 1000-0.9 UT/500ML-% IV SOLN
INTRAVENOUS | Status: DC | PRN
  Administered 2018-05-20 (×2): 500 mL

## 2018-05-20 MED ORDER — MIDAZOLAM HCL 2 MG/2ML IJ SOLN
INTRAMUSCULAR | Status: DC | PRN
  Administered 2018-05-20: 1 mg via INTRAVENOUS

## 2018-05-20 MED ORDER — ONDANSETRON HCL 4 MG/2ML IJ SOLN: 4.0000 mg | Freq: Four times a day (QID) | INTRAMUSCULAR | Status: DC | PRN

## 2018-05-20 MED ORDER — SODIUM CHLORIDE 0.9% FLUSH: 3.0000 mL | Freq: Two times a day (BID) | INTRAVENOUS | Status: DC

## 2018-05-20 MED ORDER — HEPARIN (PORCINE) IN NACL 1000-0.9 UT/500ML-% IV SOLN
INTRAVENOUS | Status: AC
  Filled 2018-05-20: qty 1000

## 2018-05-20 SURGICAL SUPPLY — 13 items
CATH BALLN WEDGE 5F 110CM (CATHETERS) ×2 IMPLANT
CATH INFINITI 5FR MULTPACK ANG (CATHETERS) ×2 IMPLANT
DEVICE RAD COMP TR BAND LRG (VASCULAR PRODUCTS) ×2 IMPLANT
ELECT DEFIB PAD ADLT CADENCE (PAD) ×2 IMPLANT
GLIDESHEATH SLEND SS 6F .021 (SHEATH) ×2 IMPLANT
GUIDEWIRE INQWIRE 1.5J.035X260 (WIRE) ×1 IMPLANT
INQWIRE 1.5J .035X260CM (WIRE) ×2
KIT HEART LEFT (KITS) ×2 IMPLANT
PACK CARDIAC CATHETERIZATION (CUSTOM PROCEDURE TRAY) ×2 IMPLANT
SHEATH GLIDE SLENDER 4/5FR (SHEATH) ×2 IMPLANT
SYR MEDRAD MARK 7 150ML (SYRINGE) ×2 IMPLANT
TRANSDUCER W/STOPCOCK (MISCELLANEOUS) ×2 IMPLANT
TUBING CIL FLEX 10 FLL-RA (TUBING) ×2 IMPLANT

## 2018-05-20 NOTE — Consult Note (Addendum)
Advanced Heart Failure Team Consult Note   Primary Physician: Dione Housekeeper, MD PCP-Cardiologist:  No primary care provider on file.  Reason for Consultation: Acute on chronic systolic CHF  HPI:    Ricardo Hunt is seen today for evaluation of acute on chronic systolic CHF at the request of Dr. [name].   Ricardo Hunt is a 78 y.o. male with h/o persistent Afib, CAD s/p CABG x 3 1996, GERD, HLD< HTN, CHB s/p St Jude pacemaker, and h/o Vertigo.   Seen last by Dr. [name] 05/02/18. Pacemaker functioning normally in setting of his CHB. He complained of progressive symptoms and dramatic decline in his EF noted. Sent for [facility].   L/RHC today showed severe 2v CAD as below, volume overload with PCWP of 27, and somewhat low cardiac output at 4.62/CI 2.34  He is feeling OK currently. No questions about cath. He had long discussion about possibility of CRT upgrade with Dr. [name] last month.  He states he has had gradual decline over the past 12 months. Lives at home alone. No problem reading or managing his meds on his own. He has only been taking his lasix once daily, as he doesn't like using the restroom frequently. He has chronic vertigo, and "stumbles all over" when he walks. He denies near syncope or falls. He is SOB changing his clothes and bathing. Not very active. He stopped smoking in 1996 after his CABG. Was not a heavy drinker, and denies drug use. Daughters present in room.   Echo 09/2016 LVEF 20%, Grade 2 DD, Mild/Mod MR, PA peak pressure 41 mm Hg Echo 05/2015 LVEF 45-50%, Grade 1 DD, Trivial MR, PA peak pressure 34 mm Hg  Review of systems complete and found to be negative unless listed in HPI.    [facility] 05/20/18  Prox RCA lesion is 40% stenosed.  Mid RCA lesion is 50% stenosed.  Ost LM to Mid LM lesion is 30% stenosed.  Dist LM to Ost LAD lesion is 90% stenosed.  Prox LAD lesion is 100% stenosed.  Prox Cx lesion is 90% stenosed.  Origin to Prox Graft lesion before Ost 1st  Diag is 100% stenosed.  LIMA graft was visualized by angiography and is normal in caliber.  The graft exhibits no disease.  There is severe left ventricular systolic dysfunction.  LV end diastolic pressure is moderately elevated.  The left ventricular ejection fraction is less than 25% by visual estimate.  There is no aortic valve stenosis.  Hemodynamic findings consistent with mild pulmonary hypertension.  Fick CO 4.62, Fick CI 2.34   1. Severe 2 vessel obstructive CAD.     - 90% ostial LAD supplying a large first diagonal    - 100% LAD after the first diagonal.    - 90% LCx at bifurcation of OM1 and OM2    - 50% mid RCA 2. Patent LIMA to the LAD 3. Occluded sequential SVG to the first diagonal and OM2 4. Severe LV dysfunction 5. Mild pulmonary venous HTN 6. Elevated LV filling pressures 7. Cardiac index 2.34  Home Medications Prior to Admission medications   Medication Sig Start Date End Date Taking? Authorizing Provider  amLODipine (NORVASC) 10 MG tablet Take 1 tablet (10 mg total) by mouth at bedtime. 10/06/12  Yes Domenic Polite, MD  carvedilol (COREG) 12.5 MG tablet TAKE 1 TABLET BY MOUTH TWO  TIMES DAILY Patient taking differently: Take 12.5 mg by mouth 2 (two) times daily with a meal.  12/19/17  Yes  Herminio Commons, MD  Doxylamine Succinate, Sleep, (SLEEP AID PO) Take 1 tablet by mouth at bedtime as needed (for sleep).   Yes [provider]  furosemide (LASIX) 40 MG tablet Take 1 tablet (40 mg total) by mouth 2 (two) times daily. 12/30/17 05/03/19 Yes Herminio Commons, MD  hydrALAZINE (APRESOLINE) 50 MG tablet Take 25 mg by mouth 2 (two) times daily.   Yes [provider]  Omega-3 Fatty Acids (FISH OIL) 1000 MG CAPS Take 2,000 mg by mouth daily.    Yes [provider]  potassium chloride SA (K-DUR,KLOR-CON) 20 MEQ tablet Take 1 tablet (20 mEq total) by mouth daily. 02/07/18 05/12/18 Yes Herminio Commons, MD  RaNITidine HCl (ACID  REDUCER PO) Take 1 tablet by mouth daily as needed (for heartburn/indigestion).   Yes [provider]  rivaroxaban (XARELTO) 20 MG TABS tablet Take 1 tablet (20 mg total) by mouth daily with supper. 05/02/18  Yes Allred, Jeneen Rinks, MD  sacubitril-valsartan (ENTRESTO) 24-26 MG Take 1 tablet by mouth 2 (two) times daily. 05/02/18  Yes Allred, Jeneen Rinks, MD  acetaminophen (TYLENOL) 500 MG tablet Take 1,000 mg by mouth 2 (two) times daily as needed (for leg pain/pain).     [provider]    Past Medical History: Past Medical History:  Diagnosis Date  . Arthritis    " all over the body"  . Atrial fibrillation and flutter ([place])    seen on PPM interrogation  . Coronary artery disease   . GERD (gastroesophageal reflux disease)   . Hyperlipidemia   . Hypertension   . Mobitz (type) II atrioventricular block    s/p STJ Accent pacemaker implanted by Dr [name] 10-16-2012  . Myocardial infarction [facility])    per pt., told that that the stress test shows that there is a weakening evident on the stress test  . Pacemaker 10/16/2012   St. Jude   . Personal history of colonic polyps-tubular adenomas 08/31/2013  . Vertigo     Past Surgical History: Past Surgical History:  Procedure Laterality Date  . cardiac bypass  1996   3 vessels  . CATARACT EXTRACTION W/ INTRAOCULAR LENS  IMPLANT, BILATERAL Bilateral 2010  . COLONOSCOPY    . CORONARY ARTERY BYPASS GRAFT    . INGUINAL HERNIA REPAIR Left 2010  . LUMBAR LAMINECTOMY/DECOMPRESSION MICRODISCECTOMY Right 07/19/2016   Procedure: Right Lumbar Four-Five Hemilaminectomy and Bilateral Lumbar Two-Three Laminectomy;  Surgeon: Eustace Moore, MD;  Location: [facility];  Service: Neurosurgery;  Laterality: Right;  Right Lumbar Four-Five Hemilaminectomy and Bilateral Lumbar Two-Three Laminectomy  . LUMBAR SPINE SURGERY  2006  . NASAL SEPTUM SURGERY  09/01/13   Dr. Adriana Reams  . PACEMAKER INSERTION  10-16-2012   STJ Accent dual chamber pacemaker implanted by Dr  [name] for symptomatic Mobitz II heart block  . PERMANENT PACEMAKER INSERTION N/A 10/16/2012   Procedure: PERMANENT PACEMAKER INSERTION;  Surgeon: Thompson Grayer, MD;  Location: [facility] CATH LAB;  Service: Cardiovascular;  Laterality: N/A;  . RIGHT/LEFT HEART CATH AND CORONARY/GRAFT ANGIOGRAPHY N/A 05/20/2018   Procedure: RIGHT/LEFT HEART CATH AND CORONARY/GRAFT ANGIOGRAPHY;  Surgeon: [name], Peter M, MD;  Location: [facility] CV LAB;  Service: Cardiovascular;  Laterality: N/A;   Family History: Family History  Problem Relation Age of Onset  . CAD Father   . Hypertension Father   . CAD Mother   . Diabetes Mother   . Breast cancer Other        Niece  . Colon cancer Neg Hx   .  Pancreatic cancer Neg Hx   . Stomach cancer Neg Hx    Social History: Social History   Socioeconomic History  . Marital status: Widowed    Spouse name: Not on file  . Number of children: Not on file  . Years of education: Not on file  . Highest education level: Not on file  Occupational History  . Not on file  Social Needs  . Financial resource strain: Not on file  . Food insecurity:    Worry: Not on file    Inability: Not on file  . Transportation needs:    Medical: Not on file    Non-medical: Not on file  Tobacco Use  . Smoking status: Former Smoker    Packs/day: 2.00    Years: 40.00    Pack years: 80.00    Types: Cigarettes    Last attempt to quit: 04/02/1994    Years since quitting: 24.1  . Smokeless tobacco: Never Used  Substance and Sexual Activity  . Alcohol use: No    Alcohol/week: 0.0 standard drinks    Comment: rare 01/01/14 occasionally- A  . Drug use: No  . Sexual activity: Yes  Lifestyle  . Physical activity:    Days per week: Not on file    Minutes per session: Not on file  . Stress: Not on file  Relationships  . Social connections:    Talks on phone: Not on file    Gets together: Not on file    Attends religious service: Not on file    Active member of club or organization: Not  on file    Attends meetings of clubs or organizations: Not on file    Relationship status: Not on file  Other Topics Concern  . Not on file  Social History Narrative  . Not on file   Allergies:  No Known Allergies  Objective:    Vital Signs:   Temp:  [97.5 F (36.4 C)-97.8 F (36.6 C)] 97.5 F (36.4 C) (02/18 1133) Pulse Rate:  [0-73] 68 (02/18 1133) Resp:  [0-30] 20 (02/18 1133) BP: (117-138)/(69-86) 134/81 (02/18 1133) SpO2:  [0 %-99 %] 97 % (02/18 1133) Weight:  [84.4 kg-84.6 kg] 84.6 kg (02/18 1133)    Weight change: Filed Weights   05/20/18 0859 05/20/18 1133  Weight: 84.4 kg 84.6 kg   Intake/Output:  No intake or output data in the 24 hours ending 05/20/18 1250   Physical Exam    General:  Elderly appearing. NAD.  HEENT: normal Neck: supple. JVP elevated to jaw. Carotids 2+ bilat; no bruits. No lymphadenopathy or thyromegaly appreciated. Cor: PMI nondisplaced. Regular rate & rhythm. No rubs, gallops or murmurs. Lungs: Diminished basilar sounds.  Abdomen: soft, nontender, nondistended. No hepatosplenomegaly. No bruits or masses. Good bowel sounds. Extremities: no cyanosis, clubbing, or rash. Trace to 1+ ankle edema.  Neuro: alert & orientedx3, cranial nerves grossly intact. moves all 4 extremities w/o difficulty. Affect pleasant  Telemetry   V paced 60-70s, personally reviewed.  EKG    Pending  Labs   Basic Metabolic Panel: No results for input(s): NA, K, CL, CO2, GLUCOSE, BUN, CREATININE, CALCIUM, MG, PHOS in the last 168 hours.  Liver Function Tests: No results for input(s): AST, ALT, ALKPHOS, BILITOT, PROT, ALBUMIN in the last 168 hours. No results for input(s): LIPASE, AMYLASE in the last 168 hours. No results for input(s): AMMONIA in the last 168 hours.  CBC: Recent Labs  Lab 05/20/18 1218  WBC 6.3  HGB 14.6  HCT 43.6  MCV 96.2  PLT 165    Cardiac Enzymes: No results for input(s): CKTOTAL, CKMB, CKMBINDEX, TROPONINI in the last 168  hours.  BNP: BNP (last 3 results) No results for input(s): BNP in the last 8760 hours.  ProBNP (last 3 results) No results for input(s): PROBNP in the last 8760 hours.   CBG: No results for input(s): GLUCAP in the last 168 hours.  Coagulation Studies: No results for input(s): LABPROT, INR in the last 72 hours.   Imaging    No results found.   Medications:     Current Medications: . [START ON 05/21/2018] amLODipine  10 mg Oral QHS  . carvedilol  12.5 mg Oral BID  . clopidogrel  600 mg Oral Once  . [START ON 05/21/2018] clopidogrel  75 mg Oral Daily  . [START ON 05/21/2018] enoxaparin (LOVENOX) injection  40 mg Subcutaneous Q24H  . furosemide  40 mg Intravenous Q12H  . hydrALAZINE  25 mg Oral BID  . [START ON 05/21/2018] omega-3 acid ethyl esters  1 g Oral Daily  . [START ON 05/21/2018] potassium chloride SA  20 mEq Oral Daily  . rosuvastatin  20 mg Oral q1800  . [START ON 05/21/2018] sacubitril-valsartan  1 tablet Oral BID  . sodium chloride flush  3 mL Intravenous Q12H     Infusions: . sodium chloride         Patient Profile   Ricardo Hunt is a 78 y.o. male with h/o persistent Afib, CAD s/p CABG, GERD, HLD, HTN, CHB s/p St Jude pacemaker, and h/o Vertigo.   Presented for planned cath which showed severe 2v CAD as above. Plan for intervention later this week, after optimization by the HF team.   Assessment/Plan   1. Acute on chronic systolic CHF - Echo 06/2990 LVEF 20%, Grade 2 DD, Mild/Mod MR, PA peak pressure 41 mm Hg - Volume status elevated on exam - Give lasix 80 mg IV and follow response. Will order for 80 mg BID - Stop hydralazine - Increase Entresto to 49/51 mg BID - Add spiro 12.5 mg qhs.  - Continue coreg 12.5 mg BID - Repeat Echo.  2. CAD s/p CABG x 3 1996. - No s/s of ischemia, but severe 2 vCAD as above - Plan for PCI Friday. Loaded with Plavix  3. CHB s/p St Jude PPM - Chronic RV pacing could be contributing. Will check EKG - Dr. [name]  follows closely. Last seen 05/02/18. Plan to see if EF gets better with management of his CAD + GDMT. If not, will consider CRT-D upgrade at that time.   4. HTN - Continue amlodipine for now. Will d/c as tolerated to make room for meds with HF data.   Medication concerns reviewed with patient and pharmacy team. Barriers identified: None at this time.   Length of Stay: 0  Annamaria Helling  05/20/2018, 12:50 PM  Advanced Heart Failure Team Pager [phone] (M-F; 7a - 4p)  Please contact [facility] for night-coverage after hours (4p -7a ) and weekends on [email or web address]  Patient seen with PA, agree with the above note.    He has been feeling gradually worse for the last several months, now NYHA class IIIb symptoms.  RHC/LHC today showed preserved cardiac output, elevated filling pressures, and occluded sequential SVG-D1 and OM.  Plan for PCI probably Friday after hemodynamic optimization, he will need atherectomy + PCI to LM/proximal LAD to restore flow to D1 and also PCI to proximal LCx to  restore flow to LCx system.   He also, of note, paces his RV chronically.   On exam, JVP 12+ cm.  Regular S1S2, 1/6 SEM RUSB.  1+ ankle edema.  Clear lungs.   1. Acute on chronic systolic CHF: Presumed ischemic cardiomyopathy but chronic RV pacing could be playing a role.  Last echo in 2018 with EF 20%, mild-moderate AS.  LV-gram today with EF < 25%.  NYHA class IIIb symptoms at home.  He is volume overloaded on exam and RHC.  Cardiac output relatively preserved on RHC.  - Lasix 80 mg IV bid. - Stop hydralazine, increase Entresto to 49/51 bid.  - Add spironolactone 12.5 mg daily.  - Continue Coreg.  - Plan for PCI Friday with repeat echo in 3 months.  If EF remains < 35%, will need upgrade of device to CRT.  2. CAD: CABG x 3 in 1996.  No ACS.  Angiography this admission with occluded sequential SVG-D1 and OM.  - Plan for PCI probably Friday after hemodynamic optimization, he will need  atherectomy + PCI to LM/proximal LAD to restore flow to D1 and also PCI to proximal LCx to restore flow to LCx system. - Load Plavix, continue ASA 81 daily.  - Continue statin.  3. CHB: St Jude PPM.  Chronically paces his RV, this may contribute to cardiomyopathy.  If EF does not improve to > 35% with PCI, will need CRT upgrade.   Loralie Champagne 05/20/2018 1:39 PM

## 2018-05-20 NOTE — Progress Notes (Addendum)
Patient arrives to 3e17 with right brachial wrapped with coban s/p sheath removal at cath lab, left TR band with 13 CC of air.   Pt denies complaints, is a/ox4.

## 2018-05-20 NOTE — Interval H&P Note (Signed)
History and Physical Interval Note:  05/20/2018 10:12 AM  Ricardo Hunt  has presented today for surgery, with the diagnosis of chest pain, sob  The various methods of treatment have been discussed with the patient and family. After consideration of risks, benefits and other options for treatment, the patient has consented to  Procedure(s): RIGHT/LEFT HEART CATH AND CORONARY/GRAFT ANGIOGRAPHY (N/A) as a surgical intervention .  The patient's history has been reviewed, patient examined, no change in status, stable for surgery.  I have reviewed the patient's chart and labs.  Questions were answered to the patient's satisfaction.     [name] [facility] 05/20/2018 10:12 AM Cath Lab Visit (complete for each Cath Lab visit)  Clinical Evaluation Leading to the Procedure:   ACS: No.  Non-ACS:    Anginal Classification: CCS III  Anti-ischemic medical therapy: Maximal Therapy (2 or more classes of medications)  Non-Invasive Test Results: No non-invasive testing performed  Prior CABG: Previous CABG

## 2018-05-21 ENCOUNTER — Inpatient Hospital Stay (HOSPITAL_COMMUNITY): Payer: Medicare Other

## 2018-05-21 DIAGNOSIS — I361 Nonrheumatic tricuspid (valve) insufficiency: Secondary | ICD-10-CM

## 2018-05-21 LAB — LIPID PANEL
Cholesterol: 147 mg/dL (ref 0–200)
HDL: 29 mg/dL — ABNORMAL LOW (ref >40)
LDL Cholesterol: 80 mg/dL (ref 0–99)
Total CHOL/HDL Ratio: 5.1 RATIO
Triglycerides: 192 mg/dL — ABNORMAL HIGH (ref <150)
VLDL: 38 mg/dL (ref 0–40)

## 2018-05-21 LAB — BASIC METABOLIC PANEL
Anion gap: 7 (ref 5–15)
BUN: 22 mg/dL (ref 8–23)
CHLORIDE: 104 mmol/L (ref 98–111)
CO2: 28 mmol/L (ref 22–32)
Calcium: 8.9 mg/dL (ref 8.9–10.3)
Creatinine, Ser: 1.35 mg/dL — ABNORMAL HIGH (ref 0.61–1.24)
GFR calc Af Amer: 57 mL/min — ABNORMAL LOW (ref >60)
GFR calc non Af Amer: 50 mL/min — ABNORMAL LOW (ref >60)
Glucose, Bld: 107 mg/dL — ABNORMAL HIGH (ref 70–99)
POTASSIUM: 3.6 mmol/L (ref 3.5–5.1)
Sodium: 139 mmol/L (ref 135–145)

## 2018-05-21 LAB — ECHOCARDIOGRAM COMPLETE
Height: 68 in
Weight: 2952 oz

## 2018-05-21 MED ORDER — PERFLUTREN LIPID MICROSPHERE
1.0000 mL | INTRAVENOUS | Status: AC | PRN
  Administered 2018-05-21: 2 mL via INTRAVENOUS
  Filled 2018-05-21: qty 10

## 2018-05-21 MED ORDER — SPIRONOLACTONE 12.5 MG HALF TABLET
12.5000 mg | ORAL_TABLET | Freq: Every day | ORAL | Status: DC
  Administered 2018-05-21 – 2018-05-22 (×2): 12.5 mg via ORAL
  Filled 2018-05-21 (×3): qty 1

## 2018-05-21 MED ORDER — METOLAZONE 2.5 MG PO TABS
2.5000 mg | ORAL_TABLET | Freq: Once | ORAL | Status: AC
  Administered 2018-05-21: 2.5 mg via ORAL
  Filled 2018-05-21: qty 1

## 2018-05-21 NOTE — Plan of Care (Signed)
  Problem: Education: Goal: Knowledge of disease or condition will improve Outcome: Progressing   Problem: Activity: Goal: Ability to tolerate increased activity will improve Outcome: Progressing   Problem: Cardiac: Goal: Ability to achieve and maintain adequate cardiopulmonary perfusion will improve Outcome: Progressing   Problem: Education: Goal: Knowledge of General Education information will improve Description Including pain rating scale, medication(s)/side effects and non-pharmacologic comfort measures Outcome: Progressing   Problem: Activity: Goal: Risk for activity intolerance will decrease Outcome: Progressing

## 2018-05-21 NOTE — Progress Notes (Signed)
Patient resting comfortably during shift report. 

## 2018-05-21 NOTE — Progress Notes (Signed)
  Echocardiogram 2D Echocardiogram has been performed.  Bobbye Charleston 05/21/2018, 2:52 PM

## 2018-05-21 NOTE — Progress Notes (Addendum)
Advanced Heart Failure Rounding Note  PCP-Cardiologist: No primary care provider on file.   Subjective:    Admitted after cath with 2V disease. PCI Friday.   Overnight he had some shortness of breath. Complaining of leg pain.    Objective:   Weight Range: 83.7 kg Body mass index is 28.05 kg/m.   Vital Signs:   Temp:  [97.5 F (36.4 C)-98.3 F (36.8 C)] 97.9 F (36.6 C) (02/19 0437) Pulse Rate:  [0-76] 76 (02/19 0848) Resp:  [0-30] 18 (02/19 0437) BP: (98-138)/(55-86) 119/73 (02/19 0848) SpO2:  [0 %-99 %] 95 % (02/19 0437) Weight:  [83.7 kg-84.6 kg] 83.7 kg (02/19 0134) Last BM Date: 05/21/18  Weight change: Filed Weights   05/20/18 0859 05/20/18 1133 05/21/18 0134  Weight: 84.4 kg 84.6 kg 83.7 kg    Intake/Output:   Intake/Output Summary (Last 24 hours) at 05/21/2018 0947 Last data filed at 05/21/2018 0856 Gross per 24 hour  Intake 723 ml  Output 1200 ml  Net -477 ml      Physical Exam    General:  Well appearing. No resp difficulty HEENT: Normal Neck: Supple. JVP 11-12  . Carotids 2+ bilat; no bruits. No lymphadenopathy or thyromegaly appreciated. Cor: PMI nondisplaced. Regular rate & rhythm. No rubs, gallops or murmurs. Lungs: Clear Abdomen: Soft, nontender, nondistended. No hepatosplenomegaly. No bruits or masses. Good bowel sounds. Extremities: No cyanosis, clubbing, rash, edema Neuro: Alert & orientedx3, cranial nerves grossly intact. moves all 4 extremities w/o difficulty. Affect pleasant   Telemetry  A sensed V paced   EKG    N/a   Labs    CBC Recent Labs    05/20/18 1038 05/20/18 1218  WBC  --  6.3  HGB 13.6 14.6  HCT 40.0 43.6  MCV  --  96.2  PLT  --  025   Basic Metabolic Panel Recent Labs    05/20/18 1038 05/20/18 1218 05/21/18 0010  NA 141  --  139  K 4.1  --  3.6  CL  --   --  104  CO2  --   --  28  GLUCOSE  --   --  107*  BUN  --   --  22  CREATININE  --  1.13 1.35*  CALCIUM  --   --  8.9   Liver Function  Tests No results for input(s): AST, ALT, ALKPHOS, BILITOT, PROT, ALBUMIN in the last 72 hours. No results for input(s): LIPASE, AMYLASE in the last 72 hours. Cardiac Enzymes No results for input(s): CKTOTAL, CKMB, CKMBINDEX, TROPONINI in the last 72 hours.  BNP: BNP (last 3 results) Recent Labs    05/20/18 1218  BNP 390.6*    ProBNP (last 3 results) No results for input(s): PROBNP in the last 8760 hours.   D-Dimer No results for input(s): DDIMER in the last 72 hours. Hemoglobin A1C No results for input(s): HGBA1C in the last 72 hours. Fasting Lipid Panel Recent Labs    05/21/18 0010  CHOL 147  HDL 29*  LDLCALC 80  TRIG 192*  CHOLHDL 5.1   Thyroid Function Tests No results for input(s): TSH, T4TOTAL, T3FREE, THYROIDAB in the last 72 hours.  Invalid input(s): FREET3  Other results:   Imaging     No results found.   Medications:     Scheduled Medications: . amLODipine  10 mg Oral QHS  . carvedilol  12.5 mg Oral BID  . clopidogrel  75 mg Oral Daily  . enoxaparin (LOVENOX)  injection  40 mg Subcutaneous Q24H  . furosemide  80 mg Intravenous BID  . omega-3 acid ethyl esters  1 g Oral Daily  . potassium chloride SA  20 mEq Oral Daily  . rosuvastatin  20 mg Oral q1800  . sacubitril-valsartan  1 tablet Oral BID  . sodium chloride flush  3 mL Intravenous Q12H     Infusions: . sodium chloride       PRN Medications:  sodium chloride, acetaminophen, ondansetron (ZOFRAN) IV, sodium chloride flush    Patient Profile   Ricardo Hunt is a 78 y.o. male with h/o persistent Afib, CAD s/p CABG, GERD, HLD, HTN, CHB s/p St Jude pacemaker, and h/o Vertigo.   Presented for planned cath which showed severe 2v CAD as above. Plan for intervention later this week, after optimization by the HF team.   Assessment/Plan   1. Acute on chronic systolic CHF - Echo 05/945 LVEF 20%, Grade 2 DD, Mild/Mod MR, PA peak pressure 41 mm Hg - Volume status remains elevated.  Sluggish urine output with 80 mg IV lasix.  - Today he will receive 80 mg IV lasix twice a day. Add ted hose.  - Continue Entresto to 49/51 mg BID - I will add 12.5 mg spironolactone daily.   - Continue coreg 12.5 mg BID  2. CAD s/p CABG x 3 1996. - No s/s ischemia, but severe 2 vCAD as above - Plan for PCI Friday. Loaded with Plavix -Continue asa and statin.   3. CHB s/p St Jude PPM - Chronic RV pacing could be contributing. Will check EKG - Dr. [name] follows closely. Last seen 05/02/18. Plan to see if EF gets better with management of his CAD + GDMT. If not, will consider CRT-D upgrade at that time.   4. HTN -Stable.   PCI Friday.   Length of Stay: 1  Amy Clegg, NP  05/21/2018, 9:47 AM  Advanced Heart Failure Team Pager [phone] (M-F; 7a - 4p)  Please contact [facility] for night-coverage after hours (4p -7a ) and weekends on [email or web address]  Patient seen with NP, agree with the above note.    UOP not vigorous overnight. Creatinine mildly higher at 1.3.   On exam, JVP 12 cm, trace ankle edema, regular S1S2.   1. Acute on chronic systolic CHF: Presumed ischemic cardiomyopathy but chronic RV pacing could be playing a role.  Last echo in 2018 with EF 20%, mild-moderate AS.  LV-gram this admission with EF < 25%.  NYHA class IIIb symptoms at home.  Cardiac output relatively preserved on RHC.  He remains volume overloaded on exam.  - Echo this admission.  - Lasix 80 mg IV bid today, will give 1 dose of metolazone with pm Lasix. - Continue Entresto to 49/51 bid.  - Add spironolactone 12.5 mg daily.  - Continue Coreg.  - Can hold amlodipine for now to allow more BP room.  - Plan for PCI Friday with repeat echo in 3 months.  If EF remains < 35%, will need upgrade of device to CRT.  2. CAD: CABG x 3 in 1996.  No ACS.  Angiography this admission with occluded sequential SVG-D1 and OM.  - Plan for PCI probably Friday after hemodynamic optimization, he will need atherectomy + PCI to  LM/proximal LAD to restore flow to D1 and also PCI to proximal LCx to restore flow to LCx system. - Loaded Plavix, continue ASA 81 daily.  - Continue statin.  3. CHB: St Jude PPM.  Chronically paces his RV, this may contribute to cardiomyopathy.  If EF does not improve to > 35% with PCI, will need CRT upgrade.   Loralie Champagne 05/21/2018 11:26 AM

## 2018-05-22 LAB — BASIC METABOLIC PANEL
Anion gap: 14 (ref 5–15)
BUN: 28 mg/dL — ABNORMAL HIGH (ref 8–23)
CO2: 26 mmol/L (ref 22–32)
Calcium: 9.3 mg/dL (ref 8.9–10.3)
Chloride: 98 mmol/L (ref 98–111)
Creatinine, Ser: 1.55 mg/dL — ABNORMAL HIGH (ref 0.61–1.24)
GFR calc Af Amer: 49 mL/min — ABNORMAL LOW (ref >60)
GFR, EST NON AFRICAN AMERICAN: 42 mL/min — AB (ref >60)
Glucose, Bld: 139 mg/dL — ABNORMAL HIGH (ref 70–99)
Potassium: 3.8 mmol/L (ref 3.5–5.1)
Sodium: 138 mmol/L (ref 135–145)

## 2018-05-22 LAB — HEMOGLOBIN A1C
HEMOGLOBIN A1C: 6.2 % — AB (ref 4.8–5.6)
MEAN PLASMA GLUCOSE: 131 mg/dL

## 2018-05-22 MED ORDER — SALINE SPRAY 0.65 % NA SOLN
1.0000 | NASAL | Status: DC | PRN
  Administered 2018-05-22: 1 via NASAL
  Filled 2018-05-22: qty 44

## 2018-05-22 MED ORDER — SODIUM CHLORIDE 0.9% FLUSH: 3.0000 mL | INTRAVENOUS | Status: DC | PRN

## 2018-05-22 MED ORDER — TRAZODONE HCL 50 MG PO TABS
25.0000 mg | ORAL_TABLET | Freq: Once | ORAL | Status: AC
  Administered 2018-05-22: 25 mg via ORAL
  Filled 2018-05-22: qty 1

## 2018-05-22 MED ORDER — SODIUM CHLORIDE 0.9 % IV SOLN: 250.0000 mL | INTRAVENOUS | Status: DC | PRN

## 2018-05-22 MED ORDER — SODIUM CHLORIDE 0.9 % IV SOLN
INTRAVENOUS | Status: DC
  Administered 2018-05-23: 06:00:00 via INTRAVENOUS

## 2018-05-22 MED ORDER — ASPIRIN 81 MG PO CHEW
81.0000 mg | CHEWABLE_TABLET | ORAL | Status: AC
  Administered 2018-05-23: 81 mg via ORAL
  Filled 2018-05-22: qty 1

## 2018-05-22 MED ORDER — TRAMADOL HCL 50 MG PO TABS
50.0000 mg | ORAL_TABLET | Freq: Two times a day (BID) | ORAL | Status: DC | PRN
  Administered 2018-05-22: 50 mg via ORAL
  Administered 2018-05-23 – 2018-05-25 (×6): 100 mg via ORAL
  Filled 2018-05-22 (×4): qty 2
  Filled 2018-05-22: qty 1
  Filled 2018-05-22 (×2): qty 2

## 2018-05-22 MED ORDER — SODIUM CHLORIDE 0.9% FLUSH
3.0000 mL | Freq: Two times a day (BID) | INTRAVENOUS | Status: DC
  Administered 2018-05-23: 3 mL via INTRAVENOUS

## 2018-05-22 NOTE — Progress Notes (Addendum)
Advanced Heart Failure Rounding Note  PCP-Cardiologist: No primary care provider on file.   Subjective:    Admitted after cath with 2V disease. PCI Friday.   Yesterday he was diuresed with IV lasix + metolazone.Negative 1.8 liters. Weight down 2 pounds. Creatinine trending up 1.3>1.5.    Denies chest pain. Complaining of leg pain.    Objective:   Weight Range: 83.7 kg Body mass index is 28.05 kg/m.   Vital Signs:   Temp:  [97.3 F (36.3 C)-98.7 F (37.1 C)] 97.7 F (36.5 C) (02/20 0537) Pulse Rate:  [44-76] 44 (02/20 0537) Resp:  [18-20] 18 (02/20 0537) BP: (92-127)/(54-93) 93/54 (02/20 0537) SpO2:  [94 %-95 %] 94 % (02/20 0537) Last BM Date: 05/21/18  Weight change: Filed Weights   05/20/18 0859 05/20/18 1133 05/21/18 0134  Weight: 84.4 kg 84.6 kg 83.7 kg    Intake/Output:   Intake/Output Summary (Last 24 hours) at 05/22/2018 0801 Last data filed at 05/22/2018 0045 Gross per 24 hour  Intake 603 ml  Output 2475 ml  Net -1872 ml      Physical Exam    General:  Well appearing. No resp difficulty HEENT: normal Neck: supple. JVP 6-7.  Carotids 2+ bilat; no bruits. No lymphadenopathy or thryomegaly appreciated. Cor: PMI nondisplaced. Regular rate & rhythm. No rubs, gallops or murmurs. Lungs: clear Abdomen: soft, nontender, nondistended. No hepatosplenomegaly. No bruits or masses. Good bowel sounds. Extremities: no cyanosis, clubbing, rash, edema Neuro: alert & orientedx3, cranial nerves grossly intact. moves all 4 extremities w/o difficulty. Affect pleasant   Telemetry   A sensed V paced 70s   EKG    N/a   Labs    CBC Recent Labs    05/20/18 1038 05/20/18 1218  WBC  --  6.3  HGB 13.6 14.6  HCT 40.0 43.6  MCV  --  96.2  PLT  --  371   Basic Metabolic Panel Recent Labs    05/21/18 0010 05/22/18 0354  NA 139 138  K 3.6 3.8  CL 104 98  CO2 28 26  GLUCOSE 107* 139*  BUN 22 28*  CREATININE 1.35* 1.55*  CALCIUM 8.9 9.3   Liver  Function Tests No results for input(s): AST, ALT, ALKPHOS, BILITOT, PROT, ALBUMIN in the last 72 hours. No results for input(s): LIPASE, AMYLASE in the last 72 hours. Cardiac Enzymes No results for input(s): CKTOTAL, CKMB, CKMBINDEX, TROPONINI in the last 72 hours.  BNP: BNP (last 3 results) Recent Labs    05/20/18 1218  BNP 390.6*    ProBNP (last 3 results) No results for input(s): PROBNP in the last 8760 hours.   D-Dimer No results for input(s): DDIMER in the last 72 hours. Hemoglobin A1C Recent Labs    05/21/18 0010  HGBA1C 6.2*   Fasting Lipid Panel Recent Labs    05/21/18 0010  CHOL 147  HDL 29*  LDLCALC 80  TRIG 192*  CHOLHDL 5.1   Thyroid Function Tests No results for input(s): TSH, T4TOTAL, T3FREE, THYROIDAB in the last 72 hours.  Invalid input(s): FREET3  Other results:   Imaging    No results found.   Medications:     Scheduled Medications: . carvedilol  12.5 mg Oral BID  . clopidogrel  75 mg Oral Daily  . enoxaparin (LOVENOX) injection  40 mg Subcutaneous Q24H  . furosemide  80 mg Intravenous BID  . omega-3 acid ethyl esters  1 g Oral Daily  . potassium chloride SA  20 mEq  Oral Daily  . rosuvastatin  20 mg Oral q1800  . sacubitril-valsartan  1 tablet Oral BID  . sodium chloride flush  3 mL Intravenous Q12H  . spironolactone  12.5 mg Oral Daily    Infusions: . sodium chloride      PRN Medications: sodium chloride, acetaminophen, ondansetron (ZOFRAN) IV, sodium chloride flush    Patient Profile   Ricardo Hunt is a 78 y.o. male with h/o persistent Afib, CAD s/p CABG, GERD, HLD, HTN, CHB s/p St Jude pacemaker, and h/o Vertigo.   Presented for planned cath which showed severe 2v CAD as above. Plan for intervention later this week, after optimization by the HF team.   Assessment/Plan   1. Acute on chronic systolic CHF - Echo 12/3816 LVEF 20%, Grade 2 DD, Mild/Mod MR, PA peak pressure 41 mm Hg - Volume status improved.  Creatinine trending up. Hold diuretics today. - Continue Entresto to 49/51 mg BID - Continue 12.5 mg spironolactone daily.   - Continue coreg 12.5 mg BID  2. CAD s/p CABG x 3 1996. - No s/s ischemia, but severe 2 vCAD as above - Plan for PCI Friday. Loaded with Plavix -Continue asa and statin.   3. CHB s/p St Jude PPM - Chronic RV pacing could be contributing. Will check EKG - Dr. [name] follows closely. Last seen 05/02/18. Plan to see if EF gets better with management of his CAD + GDMT. If not, will consider CRT-D upgrade at that time.   4. HTN -Stable.   PCI Friday.   Length of Stay: 2  Darrick Grinder, NP  05/22/2018, 8:01 AM  Advanced Heart Failure Team Pager [phone] (M-F; 7a - 4p)  Please contact [facility] for night-coverage after hours (4p -7a ) and weekends on [email or web address]  Patient seen with NP, agree with the above note.    Good diuresis yesterday, weight down.  Creatinine up to 1.5.  No chest pain, breathing is stable.   Echo: EF 20-25%, severe LV dilation, diffuse hypokinesis, normal RV, likely mild to moderate AS.   On exam, JVP not elevated, no edema, regular S1S2.   1. Acute on chronic systolic CHF: Presumed ischemic cardiomyopathy but chronic RV pacing could be playing a role. Echo with EF 20-25%, mild-moderate AS. NYHA class IIIb symptoms at home. Cardiac output relatively preserved on RHC.  Good diuresis yesterday, looks euvolemic today.  Creatinine mildly higher at 1.5.    - Hold diuretic today pending PCI tomorrow.  - Continue Entresto to 49/51 bid.  - Continue spironolactone 12.5 mg daily.  - Continue Coreg.  - Plan for PCI Friday with repeat echo in 3 months. If EF remains <35%, will need upgrade of device to CRT.  2. CAD: CABG x 3 in 1996. No ACS. Angiography this admission with occluded sequential SVG-D1 and OM.  - Plan for PCI Friday, he will need atherectomy + PCI to LM/proximal LAD to restore flow to D1 and also PCI to proximal LCx to  restore flow to LCx system. - Loaded Plavix, continue ASA 81 daily.  - Continue statin.  3. CHB: St Jude PPM. Chronically paces his RV, this may contribute to cardiomyopathy. If EF does not improve to >35% with PCI, will need CRT upgrade.  4. AKI: Mild rise in creatinine to 1.5.  Holding diuretics.   Loralie Champagne 05/22/2018 8:29 AM

## 2018-05-22 NOTE — Care Management Important Message (Signed)
Important Message  Patient Details  Name: Ricardo Hunt MRN: [id number] Date of Birth: [birth year]   Medicare Important Message Given:  Yes    [name] P [place] 05/22/2018, 11:10 AM

## 2018-05-22 NOTE — Progress Notes (Signed)
Patient requesting something for sleep. MD paged and notifies. Verbal order received from Dr Roe Coombs to give 25 mg of Trazodone once. Will continue to monitor patient.

## 2018-05-23 ENCOUNTER — Encounter (HOSPITAL_COMMUNITY)
Admission: AD | Disposition: A | Discharge status: Home Health | Payer: Self-pay | Source: Home / Self Care | Attending: Cardiology

## 2018-05-23 ENCOUNTER — Encounter (HOSPITAL_COMMUNITY): Payer: Self-pay | Admitting: Cardiology

## 2018-05-23 HISTORY — PX: CORONARY STENT INTERVENTION: CATH118234

## 2018-05-23 HISTORY — PX: CORONARY ATHERECTOMY: CATH118238

## 2018-05-23 LAB — POCT ACTIVATED CLOTTING TIME
Activated Clotting Time: 445 seconds
Activated Clotting Time: 477 seconds

## 2018-05-23 LAB — BASIC METABOLIC PANEL
Anion gap: 10 (ref 5–15)
BUN: 43 mg/dL — AB (ref 8–23)
CO2: 28 mmol/L (ref 22–32)
Calcium: 9.2 mg/dL (ref 8.9–10.3)
Chloride: 98 mmol/L (ref 98–111)
Creatinine, Ser: 1.59 mg/dL — ABNORMAL HIGH (ref 0.61–1.24)
GFR calc Af Amer: 47 mL/min — ABNORMAL LOW (ref >60)
GFR calc non Af Amer: 41 mL/min — ABNORMAL LOW (ref >60)
Glucose, Bld: 127 mg/dL — ABNORMAL HIGH (ref 70–99)
Potassium: 3.9 mmol/L (ref 3.5–5.1)
Sodium: 136 mmol/L (ref 135–145)

## 2018-05-23 SURGERY — CORONARY STENT INTERVENTION
Anesthesia: LOCAL

## 2018-05-23 MED ORDER — ANGIOPLASTY BOOK
Freq: Once | Status: AC
  Administered 2018-05-24: 06:00:00
  Filled 2018-05-23: qty 1

## 2018-05-23 MED ORDER — SODIUM CHLORIDE 0.9% FLUSH
3.0000 mL | Freq: Two times a day (BID) | INTRAVENOUS | Status: DC
  Administered 2018-05-23 – 2018-05-25 (×4): 3 mL via INTRAVENOUS

## 2018-05-23 MED ORDER — ASPIRIN 81 MG PO CHEW
81.0000 mg | CHEWABLE_TABLET | Freq: Every day | ORAL | Status: DC
  Administered 2018-05-24 – 2018-05-25 (×2): 81 mg via ORAL
  Filled 2018-05-23 (×3): qty 1

## 2018-05-23 MED ORDER — VERAPAMIL HCL 2.5 MG/ML IV SOLN
INTRAVENOUS | Status: AC
  Filled 2018-05-23: qty 2

## 2018-05-23 MED ORDER — SODIUM CHLORIDE 0.9 % IV SOLN: 250.0000 mL | INTRAVENOUS | Status: DC | PRN

## 2018-05-23 MED ORDER — HEPARIN (PORCINE) IN NACL 1000-0.9 UT/500ML-% IV SOLN
INTRAVENOUS | Status: AC
  Filled 2018-05-23: qty 500

## 2018-05-23 MED ORDER — LIDOCAINE HCL (PF) 1 % IJ SOLN
INTRAMUSCULAR | Status: AC
  Filled 2018-05-23: qty 30

## 2018-05-23 MED ORDER — VIPERSLIDE LUBRICANT OPTIME
TOPICAL | Status: DC | PRN
  Administered 2018-05-23: 12:00:00 via SURGICAL_CAVITY

## 2018-05-23 MED ORDER — ALUM & MAG HYDROXIDE-SIMETH 200-200-20 MG/5ML PO SUSP
30.0000 mL | Freq: Four times a day (QID) | ORAL | Status: DC | PRN
  Administered 2018-05-23 – 2018-05-24 (×2): 30 mL via ORAL
  Filled 2018-05-23 (×2): qty 30

## 2018-05-23 MED ORDER — VERAPAMIL HCL 2.5 MG/ML IV SOLN
INTRAVENOUS | Status: DC | PRN
  Administered 2018-05-23: 10 mL via INTRA_ARTERIAL

## 2018-05-23 MED ORDER — SODIUM CHLORIDE 0.9 % WEIGHT BASED INFUSION
1.0000 mL/kg/h | INTRAVENOUS | Status: AC
  Administered 2018-05-23: 14:00:00 1 mL/kg/h via INTRAVENOUS

## 2018-05-23 MED ORDER — HEPARIN (PORCINE) IN NACL 1000-0.9 UT/500ML-% IV SOLN
INTRAVENOUS | Status: DC | PRN
  Administered 2018-05-23 (×2): 500 mL

## 2018-05-23 MED ORDER — NITROGLYCERIN 1 MG/10 ML FOR IR/CATH LAB
INTRA_ARTERIAL | Status: AC
  Filled 2018-05-23: qty 10

## 2018-05-23 MED ORDER — MIDAZOLAM HCL 2 MG/2ML IJ SOLN
INTRAMUSCULAR | Status: DC | PRN
  Administered 2018-05-23: 1 mg via INTRAVENOUS

## 2018-05-23 MED ORDER — IOHEXOL 350 MG/ML SOLN
INTRAVENOUS | Status: DC | PRN
  Administered 2018-05-23: 135 mL via INTRA_ARTERIAL

## 2018-05-23 MED ORDER — LIDOCAINE HCL (PF) 1 % IJ SOLN
INTRAMUSCULAR | Status: DC | PRN
  Administered 2018-05-23: 2 mL via INTRADERMAL

## 2018-05-23 MED ORDER — ENOXAPARIN SODIUM 40 MG/0.4ML ~~LOC~~ SOLN: 40.0000 mg | SUBCUTANEOUS | Status: DC

## 2018-05-23 MED ORDER — SACUBITRIL-VALSARTAN 24-26 MG PO TABS: 1.0000 | ORAL_TABLET | Freq: Two times a day (BID) | ORAL | Status: DC

## 2018-05-23 MED ORDER — HEPARIN SODIUM (PORCINE) 1000 UNIT/ML IJ SOLN
INTRAMUSCULAR | Status: AC
  Filled 2018-05-23: qty 1

## 2018-05-23 MED ORDER — HEPARIN SODIUM (PORCINE) 1000 UNIT/ML IJ SOLN
INTRAMUSCULAR | Status: DC | PRN
  Administered 2018-05-23: 8000 [IU] via INTRAVENOUS

## 2018-05-23 MED ORDER — FENTANYL CITRATE (PF) 100 MCG/2ML IJ SOLN
INTRAMUSCULAR | Status: AC
  Filled 2018-05-23: qty 2

## 2018-05-23 MED ORDER — NITROGLYCERIN 1 MG/10 ML FOR IR/CATH LAB
INTRA_ARTERIAL | Status: DC | PRN
  Administered 2018-05-23: 100 ug via INTRACORONARY

## 2018-05-23 MED ORDER — SODIUM CHLORIDE 0.9 % IV SOLN: INTRAVENOUS | Status: DC

## 2018-05-23 MED ORDER — FENTANYL CITRATE (PF) 100 MCG/2ML IJ SOLN
INTRAMUSCULAR | Status: DC | PRN
  Administered 2018-05-23: 25 ug via INTRAVENOUS

## 2018-05-23 MED ORDER — MIDAZOLAM HCL 2 MG/2ML IJ SOLN
INTRAMUSCULAR | Status: AC
  Filled 2018-05-23: qty 2

## 2018-05-23 MED ORDER — SODIUM CHLORIDE 0.9% FLUSH: 3.0000 mL | INTRAVENOUS | Status: DC | PRN

## 2018-05-23 MED ORDER — SACUBITRIL-VALSARTAN 24-26 MG PO TABS
1.0000 | ORAL_TABLET | Freq: Two times a day (BID) | ORAL | Status: DC
  Administered 2018-05-23 – 2018-05-25 (×4): 1 via ORAL
  Filled 2018-05-23 (×6): qty 1

## 2018-05-23 SURGICAL SUPPLY — 21 items
BALLN SAPPHIRE 2.0X12 (BALLOONS) ×2
BALLN SAPPHIRE ~~LOC~~ 2.5X8 (BALLOONS) ×2 IMPLANT
BALLN SAPPHIRE ~~LOC~~ 2.75X15 (BALLOONS) ×2 IMPLANT
BALLOON SAPPHIRE 2.0X12 (BALLOONS) ×1 IMPLANT
CATH VISTA GUIDE 6FR XBLAD3.5 (CATHETERS) ×2 IMPLANT
CROWN DIAMONDBACK CLASSIC 1.25 (BURR) ×2 IMPLANT
DEVICE RAD COMP TR BAND LRG (VASCULAR PRODUCTS) ×2 IMPLANT
GLIDESHEATH SLEND SS 6F .021 (SHEATH) ×2 IMPLANT
GUIDEWIRE INQWIRE 1.5J.035X260 (WIRE) ×1 IMPLANT
INQWIRE 1.5J .035X260CM (WIRE) ×2
KIT ENCORE 26 ADVANTAGE (KITS) ×2 IMPLANT
KIT HEART LEFT (KITS) ×2 IMPLANT
LUBRICANT VIPERSLIDE CORONARY (MISCELLANEOUS) ×2 IMPLANT
PACK CARDIAC CATHETERIZATION (CUSTOM PROCEDURE TRAY) ×2 IMPLANT
STENT RESOLUTE ONYX 2.5X15 (Permanent Stent) ×2 IMPLANT
STENT RESOLUTE ONYX 2.5X26 (Permanent Stent) ×2 IMPLANT
TRANSDUCER W/STOPCOCK (MISCELLANEOUS) ×2 IMPLANT
TUBING CIL FLEX 10 FLL-RA (TUBING) ×2 IMPLANT
WIRE ASAHI PROWATER 180CM (WIRE) ×2 IMPLANT
WIRE HI TORQ VERSACORE-J 145CM (WIRE) ×2 IMPLANT
WIRE VIPER ADVANCE COR .012TIP (WIRE) ×2 IMPLANT

## 2018-05-23 NOTE — Progress Notes (Addendum)
Advanced Heart Failure Rounding Note  PCP-Cardiologist: No primary care provider on file.   Subjective:    Admitted after cath with 2V disease. PCI Friday.   Feeing better. Denies SOB. Denies chest pain.   Objective:   Weight Range: 82.7 kg Body mass index is 27.72 kg/m.   Vital Signs:   Temp:  [97.9 F (36.6 C)-99.1 F (37.3 C)] 98.2 F (36.8 C) (02/21 0803) Pulse Rate:  [70-75] 75 (02/21 0803) Resp:  [18-20] 18 (02/21 0803) BP: (91-113)/(56-70) 94/70 (02/21 0803) SpO2:  [93 %-98 %] 94 % (02/21 0803) Weight:  [82.2 kg-82.7 kg] 82.7 kg (02/21 0354) Last BM Date: 05/21/18  Weight change: Filed Weights   05/21/18 0134 05/22/18 1837 05/23/18 0354  Weight: 83.7 kg 82.2 kg 82.7 kg    Intake/Output:   Intake/Output Summary (Last 24 hours) at 05/23/2018 0815 Last data filed at 05/23/2018 0601 Gross per 24 hour  Intake 720 ml  Output 625 ml  Net 95 ml      Physical Exam    General:  In bed.  No resp difficulty HEENT: normal Neck: supple. JVP 7-8. Carotids 2+ bilat; no bruits. No lymphadenopathy or thryomegaly appreciated. Cor: PMI nondisplaced. Regular rate & rhythm. No rubs, gallops or murmurs. Lungs: clear Abdomen: soft, nontender, nondistended. No hepatosplenomegaly. No bruits or masses. Good bowel sounds. Extremities: no cyanosis, clubbing, rash, edema Neuro: alert & orientedx3, cranial nerves grossly intact. moves all 4 extremities w/o difficulty. Affect pleasant   Telemetry   A sensed V paced 70s   EKG    N/a   Labs    CBC Recent Labs    05/20/18 1038 05/20/18 1218  WBC  --  6.3  HGB 13.6 14.6  HCT 40.0 43.6  MCV  --  96.2  PLT  --  528   Basic Metabolic Panel Recent Labs    05/22/18 0354 05/23/18 0447  NA 138 136  K 3.8 3.9  CL 98 98  CO2 26 28  GLUCOSE 139* 127*  BUN 28* 43*  CREATININE 1.55* 1.59*  CALCIUM 9.3 9.2   Liver Function Tests No results for input(s): AST, ALT, ALKPHOS, BILITOT, PROT, ALBUMIN in the last 72  hours. No results for input(s): LIPASE, AMYLASE in the last 72 hours. Cardiac Enzymes No results for input(s): CKTOTAL, CKMB, CKMBINDEX, TROPONINI in the last 72 hours.  BNP: BNP (last 3 results) Recent Labs    05/20/18 1218  BNP 390.6*    ProBNP (last 3 results) No results for input(s): PROBNP in the last 8760 hours.   D-Dimer No results for input(s): DDIMER in the last 72 hours. Hemoglobin A1C Recent Labs    05/21/18 0010  HGBA1C 6.2*   Fasting Lipid Panel Recent Labs    05/21/18 0010  CHOL 147  HDL 29*  LDLCALC 80  TRIG 192*  CHOLHDL 5.1   Thyroid Function Tests No results for input(s): TSH, T4TOTAL, T3FREE, THYROIDAB in the last 72 hours.  Invalid input(s): FREET3  Other results:   Imaging    No results found.   Medications:     Scheduled Medications: . carvedilol  12.5 mg Oral BID  . clopidogrel  75 mg Oral Daily  . enoxaparin (LOVENOX) injection  40 mg Subcutaneous Q24H  . omega-3 acid ethyl esters  1 g Oral Daily  . potassium chloride SA  20 mEq Oral Daily  . rosuvastatin  20 mg Oral q1800  . sacubitril-valsartan  1 tablet Oral BID  . sodium chloride  flush  3 mL Intravenous Q12H  . sodium chloride flush  3 mL Intravenous Q12H  . spironolactone  12.5 mg Oral Daily    Infusions: . sodium chloride    . sodium chloride Stopped (05/23/18 0356)  . sodium chloride 10 mL/hr at 05/23/18 0601    PRN Medications: sodium chloride, sodium chloride, acetaminophen, ondansetron (ZOFRAN) IV, sodium chloride, sodium chloride flush, sodium chloride flush, traMADol    Patient Profile   Ricardo Hunt is a 78 y.o. male with h/o persistent Afib, CAD s/p CABG, GERD, HLD, HTN, CHB s/p St Jude pacemaker, and h/o Vertigo.   Presented for planned cath which showed severe 2v CAD as above. Plan for intervention later this week, after optimization by the HF team.   Assessment/Plan   1. Acute on chronic systolic CHF -Echo: EF 62-13%, severe LV dilation,  diffuse hypokinesis, normal RV, likely mild to moderate AS. - Diuretics on hold with elevated creatinine. Gently hydrate prior to cath.  - Continue Entresto to 49/51 mg BID - Continue 12.5 mg spironolactone daily.   - Continue coreg 12.5 mg BID  2. CAD s/p CABG x 3 1996. - No s/s ischemia, but severe 2 vCAD as above - Plan for PCI Friday. Loaded with Plavix -Continue asa and statin.   3. CHB s/p St Jude PPM - Chronic RV pacing could be contributing. Will check EKG - Dr. [name] follows closely. Last seen 05/02/18. Plan to see if EF gets better with management of his CAD + GDMT. If not, will consider CRT-D upgrade at that time.   4. HTN -Stable.   5. AKI  Creatinine went up with diuresis from 1.3>1.5   PCI today   Length of Stay: 3  Amy Clegg, NP  05/23/2018, 8:15 AM  Advanced Heart Failure Team Pager [phone] (M-F; 7a - 4p)  Please contact [facility] for night-coverage after hours (4p -7a ) and weekends on [email or web address]  Patient seen with NP, agree with the above note.   No dyspnea or chest pain.  Creatinine stable at 1.5 but BUN higher at 43.  Main complaint is actually leg pain.   Echo: EF 20-25%, severe LV dilation, diffuse hypokinesis, normal RV, likely mild to moderate AS.   On exam, JVP not elevated, no edema, regular S1S2.  1. Acute on chronic systolic CHF: Presumed ischemic cardiomyopathy but chronic RV pacing could be playing a role. Echo with EF 20-25%, mild-moderate AS. NYHA class IIIb symptoms at home. Cardiac output relatively preserved on RHC.He diuresed well and now looks euvolemic, breathing better.  Creatinine stable at 1.5 but BUN higher.   - Hold diuretic today, likely start po diuretic (Lasix 60 mg po bid) tomorrow if creatinine stable.  -Hold Entresto pre-cath, restart 49/51 bid this evening.  - Hold spironolactone pre-cath, restart 12.5 mg daily tomorrow.  - Continue Coreg. - Plan for PCI today with repeat echo in 3 months. If EF  remains <35%, will need upgrade of device to CRT.  2. CAD: CABG x 3 in 1996. No ACS. Angiography this admission with occluded sequential SVG-D1 and OM.  - Plan for PCI today, he will need atherectomy + PCI to LM/proximal LAD to restore flow to D1 and also PCI to proximal LCx to restore flow to LCx system. - LoadedPlavix, continue ASA 81 daily.  - Continue statin.  3. CHB: St Jude PPM. Chronically paces his RV, this may contribute to cardiomyopathy. If EF does not improve to >35% with PCI, will need CRT upgrade.  4. AKI: Mild rise in creatinine to 1.5.   - Holding diuretic at least until tomorrow.  - Hold Entesto and spironolactone this am pre-cath.  - Gentle hydration NS 75 cc/hr pre-cath.  5. Leg pain: Bilateral knees and lower legs.  Has had prior to this admission. Have tried stopping statin as outpatient without relief.  Pedal pulses palpable.  - Would consider transition to [place] as outpatient, but patient says that statin holiday did not help in the past.   Loralie Champagne 05/23/2018 8:46 AM

## 2018-05-23 NOTE — Interval H&P Note (Signed)
History and Physical Interval Note:  05/23/2018 11:41 AM  Ricardo Hunt  has presented today for surgery, with the diagnosis of CSI/PCI  The various methods of treatment have been discussed with the patient and family. After consideration of risks, benefits and other options for treatment, the patient has consented to  Procedure(s): CORONARY STENT INTERVENTION (N/A) CORONARY ATHERECTOMY (N/A) as a surgical intervention .  The patient's history has been reviewed, patient examined, no change in status, stable for surgery.  I have reviewed the patient's chart and labs.  Questions were answered to the patient's satisfaction.   Cath Lab Visit (complete for each Cath Lab visit)  Clinical Evaluation Leading to the Procedure:   ACS: No.  Non-ACS:    Anginal Classification: CCS III  Anti-ischemic medical therapy: Maximal Therapy (2 or more classes of medications)  Non-Invasive Test Results: No non-invasive testing performed  Prior CABG: Previous CABG        Ricardo Hunt 05/23/2018 11:41 AM'

## 2018-05-23 NOTE — Progress Notes (Signed)
TR BAND REMOVAL  LOCATION:  right radial  DEFLATED PER PROTOCOL:  Yes.    TIME BAND OFF / DRESSING APPLIED:   1700   SITE UPON ARRIVAL:   Level 0  SITE AFTER BAND REMOVAL:  Level 0  CIRCULATION SENSATION AND MOVEMENT:  Within Normal Limits  Yes.    COMMENTS:    

## 2018-05-23 NOTE — H&P (View-Only) (Signed)
Advanced Heart Failure Rounding Note  PCP-Cardiologist: No primary care provider on file.   Subjective:    Admitted after cath with 2V disease. PCI Friday.   Feeing better. Denies SOB. Denies chest pain.   Objective:   Weight Range: 82.7 kg Body mass index is 27.72 kg/m.   Vital Signs:   Temp:  [97.9 F (36.6 C)-99.1 F (37.3 C)] 98.2 F (36.8 C) (02/21 0803) Pulse Rate:  [70-75] 75 (02/21 0803) Resp:  [18-20] 18 (02/21 0803) BP: (91-113)/(56-70) 94/70 (02/21 0803) SpO2:  [93 %-98 %] 94 % (02/21 0803) Weight:  [82.2 kg-82.7 kg] 82.7 kg (02/21 0354) Last BM Date: 05/21/18  Weight change: Filed Weights   05/21/18 0134 05/22/18 1837 05/23/18 0354  Weight: 83.7 kg 82.2 kg 82.7 kg    Intake/Output:   Intake/Output Summary (Last 24 hours) at 05/23/2018 0815 Last data filed at 05/23/2018 0601 Gross per 24 hour  Intake 720 ml  Output 625 ml  Net 95 ml      Physical Exam    General:  In bed.  No resp difficulty HEENT: normal Neck: supple. JVP 7-8. Carotids 2+ bilat; no bruits. No lymphadenopathy or thryomegaly appreciated. Cor: PMI nondisplaced. Regular rate & rhythm. No rubs, gallops or murmurs. Lungs: clear Abdomen: soft, nontender, nondistended. No hepatosplenomegaly. No bruits or masses. Good bowel sounds. Extremities: no cyanosis, clubbing, rash, edema Neuro: alert & orientedx3, cranial nerves grossly intact. moves all 4 extremities w/o difficulty. Affect pleasant   Telemetry   A sensed V paced 70s   EKG    N/a   Labs    CBC Recent Labs    05/20/18 1038 05/20/18 1218  WBC  --  6.3  HGB 13.6 14.6  HCT 40.0 43.6  MCV  --  96.2  PLT  --  237   Basic Metabolic Panel Recent Labs    05/22/18 0354 05/23/18 0447  NA 138 136  K 3.8 3.9  CL 98 98  CO2 26 28  GLUCOSE 139* 127*  BUN 28* 43*  CREATININE 1.55* 1.59*  CALCIUM 9.3 9.2   Liver Function Tests No results for input(s): AST, ALT, ALKPHOS, BILITOT, PROT, ALBUMIN in the last 72  hours. No results for input(s): LIPASE, AMYLASE in the last 72 hours. Cardiac Enzymes No results for input(s): CKTOTAL, CKMB, CKMBINDEX, TROPONINI in the last 72 hours.  BNP: BNP (last 3 results) Recent Labs    05/20/18 1218  BNP 390.6*    ProBNP (last 3 results) No results for input(s): PROBNP in the last 8760 hours.   D-Dimer No results for input(s): DDIMER in the last 72 hours. Hemoglobin A1C Recent Labs    05/21/18 0010  HGBA1C 6.2*   Fasting Lipid Panel Recent Labs    05/21/18 0010  CHOL 147  HDL 29*  LDLCALC 80  TRIG 192*  CHOLHDL 5.1   Thyroid Function Tests No results for input(s): TSH, T4TOTAL, T3FREE, THYROIDAB in the last 72 hours.  Invalid input(s): FREET3  Other results:   Imaging    No results found.   Medications:     Scheduled Medications: . carvedilol  12.5 mg Oral BID  . clopidogrel  75 mg Oral Daily  . enoxaparin (LOVENOX) injection  40 mg Subcutaneous Q24H  . omega-3 acid ethyl esters  1 g Oral Daily  . potassium chloride SA  20 mEq Oral Daily  . rosuvastatin  20 mg Oral q1800  . sacubitril-valsartan  1 tablet Oral BID  . sodium chloride  flush  3 mL Intravenous Q12H  . sodium chloride flush  3 mL Intravenous Q12H  . spironolactone  12.5 mg Oral Daily    Infusions: . sodium chloride    . sodium chloride Stopped (05/23/18 0356)  . sodium chloride 10 mL/hr at 05/23/18 0601    PRN Medications: sodium chloride, sodium chloride, acetaminophen, ondansetron (ZOFRAN) IV, sodium chloride, sodium chloride flush, sodium chloride flush, traMADol    Patient Profile   Ricardo Hunt is a 78 y.o. male with h/o persistent Afib, CAD s/p CABG, GERD, HLD, HTN, CHB s/p St Jude pacemaker, and h/o Vertigo.   Presented for planned cath which showed severe 2v CAD as above. Plan for intervention later this week, after optimization by the HF team.   Assessment/Plan   1. Acute on chronic systolic CHF -Echo: EF 93-71%, severe LV dilation,  diffuse hypokinesis, normal RV, likely mild to moderate AS. - Diuretics on hold with elevated creatinine. Gently hydrate prior to cath.  - Continue Entresto to 49/51 mg BID - Continue 12.5 mg spironolactone daily.   - Continue coreg 12.5 mg BID  2. CAD s/p CABG x 3 1996. - No s/s ischemia, but severe 2 vCAD as above - Plan for PCI Friday. Loaded with Plavix -Continue asa and statin.   3. CHB s/p St Jude PPM - Chronic RV pacing could be contributing. Will check EKG - Dr. [name] follows closely. Last seen 05/02/18. Plan to see if EF gets better with management of his CAD + GDMT. If not, will consider CRT-D upgrade at that time.   4. HTN -Stable.   5. AKI  Creatinine went up with diuresis from 1.3>1.5   PCI today   Length of Stay: 3  Ricardo Clegg, NP  05/23/2018, 8:15 AM  Advanced Heart Failure Team Pager [phone] (M-F; 7a - 4p)  Please contact [facility] for night-coverage after hours (4p -7a ) and weekends on [email or web address]  Patient seen with NP, agree with the above note.   No dyspnea or chest pain.  Creatinine stable at 1.5 but BUN higher at 43.  Main complaint is actually leg pain.   Echo: EF 20-25%, severe LV dilation, diffuse hypokinesis, normal RV, likely mild to moderate AS.   On exam, JVP not elevated, no edema, regular S1S2.  1. Acute on chronic systolic CHF: Presumed ischemic cardiomyopathy but chronic RV pacing could be playing a role. Echo with EF 20-25%, mild-moderate AS. NYHA class IIIb symptoms at home. Cardiac output relatively preserved on RHC.He diuresed well and now looks euvolemic, breathing better.  Creatinine stable at 1.5 but BUN higher.   - Hold diuretic today, likely start po diuretic (Lasix 60 mg po bid) tomorrow if creatinine stable.  -Hold Entresto pre-cath, restart 49/51 bid this evening.  - Hold spironolactone pre-cath, restart 12.5 mg daily tomorrow.  - Continue Coreg. - Plan for PCI today with repeat echo in 3 months. If EF  remains <35%, will need upgrade of device to CRT.  2. CAD: CABG x 3 in 1996. No ACS. Angiography this admission with occluded sequential SVG-D1 and OM.  - Plan for PCI today, he will need atherectomy + PCI to LM/proximal LAD to restore flow to D1 and also PCI to proximal LCx to restore flow to LCx system. - LoadedPlavix, continue ASA 81 daily.  - Continue statin.  3. CHB: St Jude PPM. Chronically paces his RV, this may contribute to cardiomyopathy. If EF does not improve to >35% with PCI, will need CRT upgrade.  4. AKI: Mild rise in creatinine to 1.5.   - Holding diuretic at least until tomorrow.  - Hold Entesto and spironolactone this am pre-cath.  - Gentle hydration NS 75 cc/hr pre-cath.  5. Leg pain: Bilateral knees and lower legs.  Has had prior to this admission. Have tried stopping statin as outpatient without relief.  Pedal pulses palpable.  - Would consider transition to [place] as outpatient, but patient says that statin holiday did not help in the past.   Ricardo Hunt 05/23/2018 8:46 AM

## 2018-05-24 DIAGNOSIS — I25110 Atherosclerotic heart disease of native coronary artery with unstable angina pectoris: Secondary | ICD-10-CM

## 2018-05-24 LAB — BASIC METABOLIC PANEL
ANION GAP: 10 (ref 5–15)
BUN: 42 mg/dL — ABNORMAL HIGH (ref 8–23)
CO2: 24 mmol/L (ref 22–32)
Calcium: 8.7 mg/dL — ABNORMAL LOW (ref 8.9–10.3)
Chloride: 100 mmol/L (ref 98–111)
Creatinine, Ser: 1.21 mg/dL (ref 0.61–1.24)
GFR calc Af Amer: >60 mL/min (ref >60)
GFR calc non Af Amer: 57 mL/min — ABNORMAL LOW (ref >60)
Glucose, Bld: 144 mg/dL — ABNORMAL HIGH (ref 70–99)
Potassium: 4.2 mmol/L (ref 3.5–5.1)
Sodium: 134 mmol/L — ABNORMAL LOW (ref 135–145)

## 2018-05-24 LAB — CBC
HCT: 43.4 % (ref 39.0–52.0)
Hemoglobin: 14.1 g/dL (ref 13.0–17.0)
MCH: 30.9 pg (ref 26.0–34.0)
MCHC: 32.5 g/dL (ref 30.0–36.0)
MCV: 95 fL (ref 80.0–100.0)
PLATELETS: 167 10*3/uL (ref 150–400)
RBC: 4.57 MIL/uL (ref 4.22–5.81)
RDW: 14.6 % (ref 11.5–15.5)
WBC: 7.5 10*3/uL (ref 4.0–10.5)
nRBC: 0 % (ref 0.0–0.2)

## 2018-05-24 MED ORDER — RIVAROXABAN 20 MG PO TABS
20.0000 mg | ORAL_TABLET | Freq: Every day | ORAL | Status: DC
  Administered 2018-05-24 – 2018-05-25 (×2): 20 mg via ORAL
  Filled 2018-05-24 (×2): qty 1

## 2018-05-24 MED ORDER — SPIRONOLACTONE 12.5 MG HALF TABLET
12.5000 mg | ORAL_TABLET | Freq: Every day | ORAL | Status: DC
  Administered 2018-05-24 – 2018-05-25 (×2): 12.5 mg via ORAL
  Filled 2018-05-24 (×2): qty 1

## 2018-05-24 MED ORDER — FUROSEMIDE 40 MG PO TABS
60.0000 mg | ORAL_TABLET | Freq: Two times a day (BID) | ORAL | Status: DC
  Administered 2018-05-24 – 2018-05-25 (×3): 60 mg via ORAL
  Filled 2018-05-24 (×3): qty 1

## 2018-05-24 NOTE — Progress Notes (Signed)
See cardiac rehab note.  Patient very concerning for lack of medication administration and heart failure monitoring.  Daughter needs to be involved in teaching, especially on day of discharge.  Recommend home health RN f/u for HF.

## 2018-05-24 NOTE — Progress Notes (Signed)
[id number] Did not walk with pt as he walked with NT earlier to desk and his legs were hurting. Pt had difficulty with ed and will need reinforcement. Encouraged him to have his daughter read materials also. Gave pt CHF booklet and discussed the importance of daily weights. He stated he needed to get some new scales. Reviewed yellow zones in book of when to call MD with signs of CHF. Discussed low sodium foods and keeping to 2000 mg a day. Pt had 5 beats VT when discussing sodium. He stated he loves salt. Would recommend [facility] services as pt stated he is not always good with taking his meds even though he stated his daughter puts them in container for him. Stressed several times that he must take his plavix with stents. Tried to review NTG use but pt had difficulty with understanding so instructed him to call 911 if he has CP. Discussed CRP 2 and will refer to [place]. 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 May benefit from PT services also.  Graylon Good RN BSN 05/24/2018 10:38 AM

## 2018-05-24 NOTE — Progress Notes (Signed)
Spoke w patient at bedside. He states he lives at home by himself, and his daughter lives close by and helps him. He was reluctant to [facility] RN initially, but did agree it would be in his best interest. Discussed [facility] providers, [facility] and [facility] unable to accept, [facility] accepted referral. No other CM needs at this time.

## 2018-05-24 NOTE — Progress Notes (Addendum)
Progress Note  Patient Name: Ricardo Hunt Date of Encounter: 05/24/2018  Primary Cardiologist: Kate Sable, MD   Subjective   No chest pain, seems short of breath at rest this morning. Still with bilateral leg pain.  No palpitations or dizziness.  Inpatient Medications    Scheduled Meds: . aspirin  81 mg Oral Daily  . carvedilol  12.5 mg Oral BID  . clopidogrel  75 mg Oral Daily  . furosemide  60 mg Oral BID  . omega-3 acid ethyl esters  1 g Oral Daily  . potassium chloride SA  20 mEq Oral Daily  . rivaroxaban  20 mg Oral Daily  . rosuvastatin  20 mg Oral q1800  . sacubitril-valsartan  1 tablet Oral BID  . sodium chloride flush  3 mL Intravenous Q12H  . sodium chloride flush  3 mL Intravenous Q12H  . spironolactone  12.5 mg Oral Daily   Continuous Infusions: . sodium chloride    . sodium chloride     PRN Meds: sodium chloride, sodium chloride, acetaminophen, alum & mag hydroxide-simeth, ondansetron (ZOFRAN) IV, sodium chloride, sodium chloride flush, sodium chloride flush, traMADol   Vital Signs    Vitals:   05/23/18 1946 05/23/18 2202 05/24/18 0247 05/24/18 0833  BP: 100/68 (!) 117/54 100/62 (!) 96/56  Pulse: 71 78 73 78  Resp: 18  11 17   Temp: 98.4 F (36.9 C)  98 F (36.7 C)   TempSrc: Oral  Oral Oral  SpO2: 97%  96% 95%  Weight:   82.6 kg   Height:        Intake/Output Summary (Last 24 hours) at 05/24/2018 0850 Last data filed at 05/24/2018 5465 Gross per 24 hour  Intake 600 ml  Output 750 ml  Net -150 ml   Last 3 Weights 05/24/2018 05/23/2018 05/22/2018  Weight (lbs) 182 lb 1.6 oz 182 lb 5.1 oz 181 lb 3.2 oz  Weight (kg) 82.6 kg 82.7 kg 82.192 kg      Telemetry    AV paced - Personally Reviewed  ECG    N/a - Personally Reviewed  Physical Exam   GEN: No acute distress.   Neck: mild JVD Cardiac: RRR, no murmurs, rubs, or gallops.  Respiratory: Clear to auscultation bilaterally. GI: Soft, nontender, non-distended  MS: No edema; No  deformity. Left radial cath site stable.  Neuro:  Nonfocal  Psych: Normal affect   Labs    Chemistry Recent Labs  Lab 05/22/18 0354 05/23/18 0447 05/24/18 0243  NA 138 136 134*  K 3.8 3.9 4.2  CL 98 98 100  CO2 26 28 24   GLUCOSE 139* 127* 144*  BUN 28* 43* 42*  CREATININE 1.55* 1.59* 1.21  CALCIUM 9.3 9.2 8.7*  GFRNONAA 42* 41* 57*  GFRAA 49* 47* >60  ANIONGAP 14 10 10      Hematology Recent Labs  Lab 05/20/18 1038 05/20/18 1218 05/24/18 0243  WBC  --  6.3 7.5  RBC  --  4.53 4.57  HGB 13.6 14.6 14.1  HCT 40.0 43.6 43.4  MCV  --  96.2 95.0  MCH  --  32.2 30.9  MCHC  --  33.5 32.5  RDW  --  14.6 14.6  PLT  --  165 167    BNP Recent Labs  Lab 05/20/18 1218  BNP 390.6*     Radiology    No results found.  Cardiac Studies   Cath: 05/21/2018   Prox RCA lesion is 40% stenosed.  Mid RCA lesion  is 50% stenosed.  Ost LM to Mid LM lesion is 30% stenosed.  Dist LM to Ost LAD lesion is 90% stenosed.  Prox LAD lesion is 100% stenosed.  Prox Cx lesion is 90% stenosed.  Origin to Prox Graft lesion before Ost 1st Diag is 100% stenosed.  LIMA graft was visualized by angiography and is normal in caliber.  The graft exhibits no disease.  There is severe left ventricular systolic dysfunction.  LV end diastolic pressure is moderately elevated.  The left ventricular ejection fraction is less than 25% by visual estimate.  There is no aortic valve stenosis.  Hemodynamic findings consistent with mild pulmonary hypertension.   1. Severe 2 vessel obstructive CAD.     - 90% ostial LAD supplying a large first diagonal    - 100% LAD after the first diagonal.    - 90% LCx at bifurcation of OM1 and OM2    - 50% mid RCA 2. Patent LIMA to the LAD 3. Occluded sequential SVG to the first diagonal and OM2 4. Severe LV dysfunction 5. Mild pulmonary venous HTN 6. Elevated LV filling pressures 7. Cardiac index 2.34  Plan: will admit to telemetry. Consult  advanced heart failure team to optimize CHF therapy. Will load with Plavix. Anticipate complex PCI later this week including stenting of the LCx and atherectomy and stenting of the left main/ostial LAD.   Cath: 05/23/2018   Ost LM to Mid LM lesion is 30% stenosed.  Dist LM to Ost LAD lesion is 90% stenosed.  A drug-eluting stent was successfully placed using a STENT RESOLUTE ONYX 2.5X26.  Post intervention, there is a 0% residual stenosis.  Prox Cx lesion is 90% stenosed.  Post intervention, there is a 0% residual stenosis.  A drug-eluting stent was successfully placed using a STENT RESOLUTE ONYX 2.5X15.   1. Successful PCI of the proximal to mid LCx with DES x 1 2. Successful PCI of the ostial LAD with orbital atherectomy and DES x 1.   Plan: DAPT with ASA and Plavix. Continue ASA for one month then discontinue. Continue Plavix for one year. May resume concomitant DOAC therapy tomorrow if stable. Further management per Advanced heart failure team  TTE: 05/21/2018  IMPRESSIONS    1. The left ventricle has severely reduced systolic function, with an ejection fraction of 20-25%. The cavity size was severely dilated. There is mildly increased left ventricular wall thickness. Left ventricular diastolic Doppler parameters are  indeterminate Indeterminent filling pressures.  2. Diffuse hypokinesis septal and apical akinesis.  3. The right ventricle has normal systolic function. The cavity was normal. There is no increase in right ventricular wall thickness.  4. Left atrial size was moderately dilated.  5. The mitral valve is degenerative. Moderate thickening of the mitral valve leaflet. Moderate calcification of the mitral valve leaflet. There is moderate mitral annular calcification present.  6. The tricuspid valve is normal in structure.  7. The aortic valve is tricuspid Severely thickening of the aortic valve Severe calcifcation of the aortic valve.  8. Degree of AS hard to judge  due to low EF likely mild to moderate.  9. The pulmonic valve was grossly normal. Pulmonic valve regurgitation is mild by color flow Doppler. 10. The aortic root is normal in size and structure. 11. The interatrial septum was not well visualized.  Patient Profile     78 y.o. male with h/o persistent Afib, CADs/p CABG, GERD, HLD,HTN, CHB s/p St Jude pacemaker, and h/o Vertigo.   Presented for  planned cath which showed severe 2v CAD as above. Seen by AHF team this admission.   Assessment & Plan    1. Acute on chronic systolic CHF: Presumed ischemic cardiomyopathy but chronic RV pacing could be playing a role. Echo with EF 20-25%, mild-moderate AS. NYHA class IIIb symptoms at home. Diuretic held yesterday prior to cath. Cr stable today. Appears dyspneic with conversation. Will observe today given his respiratory status.  -start po diuretic Lasix 60 mg po bid today, follow output -restart Entresto last evening 49/51 bid -restart spiro 12.5mg  daily - Continue Coreg - renal function in the morning   2. CAD: CABG x 3 in 1996. No ACS. Angiography this admission with occluded sequential SVG-D1 and OM.  - Underwent atherectomy + PCI to ostial LAD and PCI to p/m LCx - DAPT with plavix/ ASA 81 daily. Triple therapy with ASA, plavix and Xarelto for one month, then stop ASA. - Continue statin.   3. CHB: St Jude PPM. Chronically paces his RV, this may contribute to cardiomyopathy. If EF does not improve to >35% with PCI, will need CRT upgrade.  4. AKI: Cr peaked at 1.5, down to 1.2 today. - restarted HF meds - BMET in am  5. Leg pain: Bilateral knees and lower legs.  Has had prior to this admission. Have tried stopping statin as outpatient without relief.  Pedal pulses palpable.  - Consider transition to [place] as outpatient, but patient says that statin holiday did not help in the past.   6. Afib/Flutter: noted on PPM interrogation in the past. Resume Xarelto.   For questions  or updates, please contact [place] Please consult [email or web address] for contact info under      Signed, Reino Bellis, NP 05/24/2018, 8:50 AM     Attending note:  Patient seen and examined.  Chart reviewed and case discussed with Ms. Mancel Bale NP.  Patient hospitalized with acute on chronic systolic heart failure with medical therapy adjustments and ultimately cardiac catheterization which was performed yesterday.  He underwent placement of DES to the proximal to mid circumflex as well as atherectomy with DES to the ostial LAD.  He reports no chest pain today, mildly short of breath however.  He is afebrile.  Heart rate is in the 70s to 80s with ventricular paced rhythm by telemetry.  Exhibit decreased breath sounds few faint crackles at the bases but no wheezing.  Cardiac exam reveals RRR without gallop.  Cardiac catheterization arteriotomy site is stable.  Lab work shows potassium 4.2, BUN 42, creatinine 1.2, hemoglobin 14.1, platelets 167.  ECG shows dual-chamber pacing with remittent PVCs.  Plan is to watch him 1 more day with resumption of baseline heart failure medicines, creatinine improved overall but status post angiography yesterday.  Ambulate today.  Lasix resumed at 60 mg twice daily after prior hold.  Reevaluate in the morning with follow-up BMET, anticipate discharge if stable.  Satira Sark, M.D., F.A.C.C.

## 2018-05-25 ENCOUNTER — Encounter (HOSPITAL_COMMUNITY): Payer: Self-pay | Admitting: Physician Assistant

## 2018-05-25 DIAGNOSIS — R0609 Other forms of dyspnea: Secondary | ICD-10-CM

## 2018-05-25 DIAGNOSIS — N179 Acute kidney failure, unspecified: Secondary | ICD-10-CM

## 2018-05-25 LAB — BASIC METABOLIC PANEL
Anion gap: 12 (ref 5–15)
BUN: 47 mg/dL — ABNORMAL HIGH (ref 8–23)
CO2: 25 mmol/L (ref 22–32)
Calcium: 9.2 mg/dL (ref 8.9–10.3)
Chloride: 97 mmol/L — ABNORMAL LOW (ref 98–111)
Creatinine, Ser: 1.2 mg/dL (ref 0.61–1.24)
GFR calc Af Amer: >60 mL/min (ref >60)
GFR calc non Af Amer: 57 mL/min — ABNORMAL LOW (ref >60)
Glucose, Bld: 139 mg/dL — ABNORMAL HIGH (ref 70–99)
Potassium: 4.4 mmol/L (ref 3.5–5.1)
Sodium: 134 mmol/L — ABNORMAL LOW (ref 135–145)

## 2018-05-25 MED ORDER — SACUBITRIL-VALSARTAN 24-26 MG PO TABS: 1.0000 | ORAL_TABLET | Freq: Two times a day (BID) | ORAL | 11 refills | Status: DC

## 2018-05-25 MED ORDER — CARVEDILOL 12.5 MG PO TABS: 12.5000 mg | ORAL_TABLET | Freq: Two times a day (BID) | ORAL | 3 refills | Status: DC

## 2018-05-25 MED ORDER — CLOPIDOGREL BISULFATE 75 MG PO TABS: 75.0000 mg | ORAL_TABLET | Freq: Every day | ORAL | 11 refills | Status: DC

## 2018-05-25 MED ORDER — SPIRONOLACTONE 25 MG PO TABS: 12.5000 mg | ORAL_TABLET | Freq: Every day | ORAL | 11 refills | Status: DC

## 2018-05-25 MED ORDER — ROSUVASTATIN CALCIUM 20 MG PO TABS: 20.0000 mg | ORAL_TABLET | Freq: Every day | ORAL | 11 refills | Status: DC

## 2018-05-25 MED ORDER — ASPIRIN EC 81 MG PO TBEC: 81.0000 mg | DELAYED_RELEASE_TABLET | Freq: Every day | ORAL | 0 refills | Status: AC

## 2018-05-25 NOTE — Progress Notes (Signed)
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

## 2018-05-25 NOTE — Discharge Summary (Addendum)
Discharge Summary    Patient ID: Ricardo Hunt MRN: [id number]; DOB: 1940-09-10  Admit date: 05/20/2018 Discharge date: 05/25/2018  Primary Care Provider: Dione Housekeeper, MD  Primary Cardiologist: Kate Sable, MD   Primary Electrophysiologist:  Thompson Grayer, MD    Discharge Diagnoses    Principal Problem:   Coronary artery disease involving native coronary artery of native heart with angina pectoris [facility]) Active Problems:   Acute on chronic systolic CHF (congestive heart failure) ([place])   Hyperlipidemia   AKI (acute kidney injury) ([place])   Hypertension   Complete heart block ([place]) s/p Pacemaker in 2014   Allergies No Known Allergies  Diagnostic Studies/Procedures     Echocardiogram 05/21/2018  1. The left ventricle has severely reduced systolic function, with an ejection fraction of 20-25%. The cavity size was severely dilated. There is mildly increased left ventricular wall thickness. Left ventricular diastolic Doppler parameters are  indeterminate Indeterminent filling pressures.  2. Diffuse hypokinesis septal and apical akinesis.  3. The right ventricle has normal systolic function. The cavity was normal. There is no increase in right ventricular wall thickness.  4. Left atrial size was moderately dilated.  5. The mitral valve is degenerative. Moderate thickening of the mitral valve leaflet. Moderate calcification of the mitral valve leaflet. There is moderate mitral annular calcification present.  6. The tricuspid valve is normal in structure.  7. The aortic valve is tricuspid Severely thickening of the aortic valve Severe calcifcation of the aortic valve.  8. Degree of AS hard to judge due to low EF likely mild to moderate.  9. The pulmonic valve was grossly normal. Pulmonic valve regurgitation is mild by color flow Doppler. 10. The aortic root is normal in size and structure. 11. The interatrial septum was not well visualized.  Percutaneous coronary intervention  05/23/2018 1. Successful PCI of the proximal to mid LCx with DES x 1 (2.5 x 15 mm Resolute Onyx DES) 2. Successful PCI of the ostial LAD with orbital atherectomy and DES x 1 (2.5 x 26 Resolute Onyx DES).  Post PCI:   Right and left heart catheterization 05/20/2018 1. Severe 2 vessel obstructive CAD.     - 90% ostial LAD supplying a large first diagonal    - 100% LAD after the first diagonal.    - 90% LCx at bifurcation of OM1 and OM2    - 50% mid RCA 2. Patent LIMA to the LAD 3. Occluded sequential SVG to the first diagonal and OM2 4. Severe LV dysfunction, EF <25 5. Mild pulmonary venous HTN 6. Elevated LV filling pressures 7. Cardiac index 2.34 Plan: will admit to telemetry. Consult advanced heart failure team to optimize CHF therapy. Will load with Plavix. Anticipate complex PCI later this week including stenting of the LCx and atherectomy and stenting of the left main/ostial LAD.     _____________   History of Present Illness     Ricardo Hunt is a 78 y.o. male with coronary artery disease status post CABG in 9518, combined systolic and diastolic heart failure, atrial fibrillation/flutter, high-grade heart block status post pacemaker implantation in 2014, hypertension, hyperlipidemia.  He was seen in follow-up by Dr. [name] 05/02/2018.  He had not been seen since July 2018.  An echocardiogram around that time demonstrated severe LV dysfunction with an EF of 20%.  At his visit with Dr. [name], he complained of worsening chest discomfort and shortness of breath.  He was also noted to have persistent atrial fibrillation on his device interrogation.  Outpatient cardiac catheterization was arranged.  He was also placed on rivaroxaban for anticoagulation.   Hospital Course     Consultants: Advanced Heart Failure Team   Mr. Pavey presented to the hospital on 05/20/2018 for cardiac catheterization.  This demonstrated severe two-vessel CAD with 90% ostial LAD supplying a large first diagonal,  100% LAD after the first diagonal, 90% LCx at bifurcation of OM1 and OM2 and 50% mid RCA stenosis.  LIMA-LAD was patent and the SVG-D1/OM 2 was occluded.  Filling pressures were elevated and cardiac index was 2.34.  The advanced heart failure team was consulted to optimize CHF therapy.  Plan was made to arrange staged PCI of the LCx and atherectomy and stenting of the left main/ostial LAD.  He was seen by Dr. Aundra Dubin with the advanced heart failure team.  Cardiomyopathy was presumed to be ischemic but chronic RV pacing could have been playing a role.  Patient is noted to be NYHA class IIIb.  Echocardiogram demonstrated EF 20-25%.  His heart failure regimen was adjusted (hydralazine discontinued; Entresto started, spironolactone started).  He was diuresed with IV Lasix augmented with metolazone.  He did have an increase in his creatinine which resulted in holding his diuretics.  His Entresto and spironolactone were both held prior to cath as well.  He went back to the Cath Lab with Dr. [name] on May 23, 2018 and underwent PCI with drug-eluting stent to the LCx as well as orbital atherectomy and drug-eluting stent to the LAD.  He will need dual antiplatelet therapy with aspirin and Plavix in addition to Rivaroxaban for 1 month.  He will stop aspirin after 1 month and continue on Plavix for 1 year.  He had some increasing shortness of breath 1 day post PCI.  Diuretics were resumed post catheterization as well as his Entresto and spironolactone and he was observed for 1 more day.  It was felt that if his ejection fraction does not improve to greater than 35%, he will need upgrade to a CRT-D.  He also has a history of intolerance to statins.  He is being discharged on Rosuvastatin 20 mg QD.  It was recommended that PCSK9 inhibitor therapy be considered as an outpatient if he cannot tolerate Rosuvastatin.  He was evaluated by Dr. Haroldine Laws this morning.  He is doing better with improved breathing and no  further chest discomfort.  Therefore, he is felt stable for discharge to home on Entresto, Spironolactone 12.5 mg daily, carvedilol 12.5 mg twice daily and home dose of furosemide 40 mg twice daily.  He was set up for home health nursing with plans to transition to outpatient cardiac rehab when home health is complete.  He will need close outpatient follow up as well.  I will arrange a TCM visit in [place] in 1 week.    _____________  Discharge Vitals Blood pressure 115/72, pulse 70, temperature 97.9 F (36.6 C), temperature source Oral, resp. rate 17, height 5\' 8"  (1.727 m), weight 80.5 kg, SpO2 98 %.  Filed Weights   05/24/18 0247 05/24/18 1203 05/25/18 0500  Weight: 82.6 kg 82.9 kg 80.5 kg    Labs & Radiologic Studies    CBC Recent Labs    05/24/18 0243  WBC 7.5  HGB 14.1  HCT 43.4  MCV 95.0  PLT 573   Basic Metabolic Panel Recent Labs    05/24/18 0243 05/25/18 0536  NA 134* 134*  K 4.2 4.4  CL 100 97*  CO2 24 25  GLUCOSE 144*  139*  BUN 42* 47*  CREATININE 1.21 1.20  CALCIUM 8.7* 9.2    Disposition   Pt is being discharged home today in good condition.  Follow-up Plans & Appointments    Follow-up Information    [facility].   Specialty:  Home Health Services Why:  For home health RN. They will call you to schedule your first home health appointment.  Contact information: [place] [address] [phone]        Herminio Commons, MD Follow up in 7 day(s).   Specialty:  Cardiology Why:  The office will call to arrange a follow up in 1 week.  Contact information: [place] [address] [phone]          Discharge Instructions    (HEART FAILURE PATIENTS) Call MD:  Anytime you have any of the following symptoms: 1) 3 pound weight gain in 24 hours or 5 pounds in 1 week 2) shortness of breath, with or without a dry hacking cough 3) swelling in the hands, feet or stomach 4) if  you have to sleep on extra pillows at night in order to breathe.   Complete by:  As directed    Amb Referral to Cardiac Rehabilitation   Complete by:  As directed    Diagnosis:  Coronary Stents   Diet - low sodium heart healthy   Complete by:  As directed    Driving Restrictions   Complete by:  As directed    No driving for 7 days   Increase activity slowly   Complete by:  As directed    Lifting restrictions   Complete by:  As directed    No lifting over 5 lbs for 7 days      Discharge Medications   Allergies as of 05/25/2018   No Known Allergies     Medication List    STOP taking these medications   amLODipine 10 MG tablet Commonly known as:  NORVASC   hydrALAZINE 50 MG tablet Commonly known as:  APRESOLINE     TAKE these medications   acetaminophen 500 MG tablet Commonly known as:  TYLENOL Take 1,000 mg by mouth 2 (two) times daily as needed (for leg pain/pain).   ACID REDUCER PO Take 1 tablet by mouth daily as needed (for heartburn/indigestion).   aspirin EC 81 MG tablet Take 1 tablet (81 mg total) by mouth daily for 30 days.   carvedilol 12.5 MG tablet Commonly known as:  COREG Take 1 tablet (12.5 mg total) by mouth 2 (two) times daily with a meal.   clopidogrel 75 MG tablet Commonly known as:  PLAVIX Take 1 tablet (75 mg total) by mouth daily. Start taking on:  May 26, 2018   Fish Oil 1000 MG Caps Take 2,000 mg by mouth daily.   furosemide 40 MG tablet Commonly known as:  LASIX Take 1 tablet (40 mg total) by mouth 2 (two) times daily.   potassium chloride SA 20 MEQ tablet Commonly known as:  K-DUR,KLOR-CON Take 1 tablet (20 mEq total) by mouth daily.   rivaroxaban 20 MG Tabs tablet Commonly known as:  XARELTO Take 1 tablet (20 mg total) by mouth daily with supper.   rosuvastatin 20 MG tablet Commonly known as:  CRESTOR Take 1 tablet (20 mg total) by mouth daily at 6 PM.   sacubitril-valsartan 24-26 MG Commonly known as:   ENTRESTO Take 1 tablet by mouth 2 (two)  times daily.   SLEEP AID PO Take 1 tablet by mouth at bedtime as needed (for sleep).   spironolactone 25 MG tablet Commonly known as:  ALDACTONE Take 0.5 tablets (12.5 mg total) by mouth daily. Start taking on:  May 26, 2018        Acute coronary syndrome (MI, NSTEMI, STEMI, etc) this admission?: No.    Outstanding Labs/Studies   1. Patient should have a BMET obtained at follow up office visit in 1 week.   Duration of Discharge Encounter   Greater than 30 minutes including physician time.  Signed, Richardson Dopp, PA-C 05/25/2018, 1:36 PM  Patient seen and examined with the above-signed Advanced Practice Provider and/or Housestaff. I personally reviewed laboratory data, imaging studies and relevant notes. I independently examined the patient and formulated the important aspects of the plan. I have edited the note to reflect any of my changes or salient points. I have personally discussed the plan with the patient and/or family.  He is much improved aft PCI and diuresis Ready for d/c today with HHRN followed by CR.   Glori Bickers, MD  2:37 PM

## 2018-05-25 NOTE — Progress Notes (Signed)
Advanced Heart Failure Rounding Note  PCP-Cardiologist: Kate Sable, MD   Subjective:    Admitted after cath with 2V disease. Had PCI x 2 on 2/21  Was kept yesterday due to dyspnea. Po lasix restarted. Weight down 5 pounds. Creatinine stable at 1.2 Breathing improved. No CP, orthopnea or PND.   Objective:   Weight Range: 80.5 kg Body mass index is 26.99 kg/m.   Vital Signs:   Temp:  [97.7 F (36.5 C)-98.5 F (36.9 C)] 98.1 F (36.7 C) (02/23 0807) Pulse Rate:  [35-75] 59 (02/23 0815) Resp:  [18-19] 19 (02/23 0807) BP: (99-116)/(44-71) 104/59 (02/23 0807) SpO2:  [95 %-97 %] 95 % (02/23 0807) Weight:  [80.5 kg-82.9 kg] 80.5 kg (02/23 0500) Last BM Date: 05/22/18  Weight change: Filed Weights   05/24/18 0247 05/24/18 1203 05/25/18 0500  Weight: 82.6 kg 82.9 kg 80.5 kg    Intake/Output:   Intake/Output Summary (Last 24 hours) at 05/25/2018 1201 Last data filed at 05/25/2018 0819 Gross per 24 hour  Intake 403 ml  Output 1155 ml  Net -752 ml      Physical Exam    General:  Well appearing.Lying flat in bed ormal Neck: supple. JVP 6-7. Carotids 2+ bilat; no bruits. No lymphadenopathy or thryomegaly appreciated. Cor: PMI nondisplaced. Regular rate & rhythm. No rubs, gallops or murmurs. Lungs: clear Abdomen: soft, nontender, nondistended. No hepatosplenomegaly. No bruits or masses. Good bowel sounds. Extremities: no cyanosis, clubbing, rash, edema + ted hose Neuro: alert & orientedx3, cranial nerves grossly intact. moves all 4 extremities w/o difficulty. Affect pleasant   Telemetry   A sensed V paced 70s   EKG    N/a   Labs    CBC Recent Labs    05/24/18 0243  WBC 7.5  HGB 14.1  HCT 43.4  MCV 95.0  PLT 275   Basic Metabolic Panel Recent Labs    05/24/18 0243 05/25/18 0536  NA 134* 134*  K 4.2 4.4  CL 100 97*  CO2 24 25  GLUCOSE 144* 139*  BUN 42* 47*  CREATININE 1.21 1.20  CALCIUM 8.7* 9.2   Liver Function Tests No results for  input(s): AST, ALT, ALKPHOS, BILITOT, PROT, ALBUMIN in the last 72 hours. No results for input(s): LIPASE, AMYLASE in the last 72 hours. Cardiac Enzymes No results for input(s): CKTOTAL, CKMB, CKMBINDEX, TROPONINI in the last 72 hours.  BNP: BNP (last 3 results) Recent Labs    05/20/18 1218  BNP 390.6*    ProBNP (last 3 results) No results for input(s): PROBNP in the last 8760 hours.   D-Dimer No results for input(s): DDIMER in the last 72 hours. Hemoglobin A1C No results for input(s): HGBA1C in the last 72 hours. Fasting Lipid Panel No results for input(s): CHOL, HDL, LDLCALC, TRIG, CHOLHDL, LDLDIRECT in the last 72 hours. Thyroid Function Tests No results for input(s): TSH, T4TOTAL, T3FREE, THYROIDAB in the last 72 hours.  Invalid input(s): FREET3  Other results:   Imaging    No results found.   Medications:     Scheduled Medications: . aspirin  81 mg Oral Daily  . carvedilol  12.5 mg Oral BID  . clopidogrel  75 mg Oral Daily  . furosemide  60 mg Oral BID  . omega-3 acid ethyl esters  1 g Oral Daily  . potassium chloride SA  20 mEq Oral Daily  . rivaroxaban  20 mg Oral Q supper  . rosuvastatin  20 mg Oral q1800  . sacubitril-valsartan  1 tablet Oral BID  . sodium chloride flush  3 mL Intravenous Q12H  . sodium chloride flush  3 mL Intravenous Q12H  . spironolactone  12.5 mg Oral Daily    Infusions: . sodium chloride    . sodium chloride      PRN Medications: sodium chloride, sodium chloride, acetaminophen, alum & mag hydroxide-simeth, ondansetron (ZOFRAN) IV, sodium chloride, sodium chloride flush, sodium chloride flush, traMADol    Patient Profile   Ricardo Hunt is a 78 y.o. male with h/o persistent Afib, CAD s/p CABG, GERD, HLD, HTN, CHB s/p St Jude pacemaker, and h/o Vertigo.   Presented for planned cath which showed severe 2v CAD as above. S/p PCI x 2 on 2/21  Assessment/Plan   1. Acute on chronic systolic CHF -Echo: EF 90-21%, severe  LV dilation, diffuse hypokinesis, normal RV, likely mild to moderate AS. - Po diuretics restarted yesterday. Weight down 5 pounds. Renal function stable  - Continue Entresto  49/51 mg BID - Continue 12.5 mg spironolactone daily.   - Continue coreg 12.5 mg BID - Switch back to home dose lasix 40 bid   2. CAD s/p CABG x 3 1996. - s/p  PCI of the proximal to mid LCx with DES x 1 &  PCI of the ostial LAD with orbital atherectomy and DES x 1 on 2/21 -Continue DAPT and statin.   3. CHB s/p St Jude PPM - Chronic RV pacing could be contributing. Will check EKG - Dr. [name] follows closely. Last seen 05/02/18. Plan to see if EF gets better with management of his CAD + GDMT. If not, will consider CRT-D upgrade at that time.   4. HTN -Stable.   5. AKI  Creatinine went up with diuresis from 1.3>1.5. Now back to 1.2  Can go home today with [facility] for [facility] f/u. Once [facility] complete will need referral to CR.   Length of Stay: [place], MD  05/25/2018, 12:01 PM  Advanced Heart Failure Team Pager [phone] (M-F; [place])  Please contact [name] for night-coverage after hours (4p -7a ) and weekends on [email or web address]

## 2018-05-25 NOTE — Discharge Instructions (Signed)
·   You will remain on Plavix, Aspirin for 30 days.  Then, you will stop Aspirin but remain on Plavix.  You will also remain on Xarelto permanently.   Information on my medicine - XARELTO (Rivaroxaban)  This medication education was reviewed with me or my healthcare representative as part of my discharge preparation.    Why was Xarelto prescribed for you? Xarelto was prescribed for you to reduce the risk of a blood clot forming that can cause a stroke if you have a medical condition called atrial fibrillation (a type of irregular heartbeat).  What do you need to know about xarelto ? Take your Xarelto ONCE DAILY at the same time every day with your evening meal. If you have difficulty swallowing the tablet whole, you may crush it and mix in applesauce just prior to taking your dose.  Take Xarelto exactly as prescribed by your doctor and DO NOT stop taking Xarelto without talking to the doctor who prescribed the medication.  Stopping without other stroke prevention medication to take the place of Xarelto may increase your risk of developing a clot that causes a stroke.  Refill your prescription before you run out.  After discharge, you should have regular check-up appointments with your healthcare provider that is prescribing your Xarelto.  In the future your dose may need to be changed if your kidney function or weight changes by a significant amount.  What do you do if you miss a dose? If you are taking Xarelto ONCE DAILY and you miss a dose, take it as soon as you remember on the same day then continue your regularly scheduled once daily regimen the next day. Do not take two doses of Xarelto at the same time or on the same day.   Important Safety Information A possible side effect of Xarelto is bleeding. You should call your healthcare provider right away if you experience any of the following: ? Bleeding from an injury or your nose that does not stop. ? Unusual colored urine  (red or dark brown) or unusual colored stools (red or black). ? Unusual bruising for unknown reasons. ? A serious fall or if you hit your head (even if there is no bleeding).  Some medicines may interact with Xarelto and might increase your risk of bleeding while on Xarelto. To help avoid this, consult your healthcare provider or pharmacist prior to using any new prescription or non-prescription medications, including herbals, vitamins, non-steroidal anti-inflammatory drugs (NSAIDs) and supplements.  This website has more information on Xarelto: [email or web address]

## 2018-05-26 ENCOUNTER — Telehealth: Payer: Self-pay | Admitting: Cardiovascular Disease

## 2018-05-26 NOTE — Telephone Encounter (Signed)
DC from hospital on 05/25/2018  Please arrange FU in 1 week with Dr. Bronson Ing.  This is a TCM appointment.  Patient should have a BMET at follow up.  Also, please make sure he has follow up with Dr. [name] in 3 months.  Signed,  Richardson Dopp, PA-C   05/25/2018 1:31 PM     Patient is schedule March 2 with Dr Bronson Ing Patient is scheduled in May with Dr [name]

## 2018-05-28 NOTE — Telephone Encounter (Signed)
Left message to return call 

## 2018-05-30 NOTE — Telephone Encounter (Signed)
Second attempt to reach patient.

## 2018-06-02 ENCOUNTER — Encounter: Payer: Self-pay | Admitting: Cardiovascular Disease

## 2018-06-02 ENCOUNTER — Ambulatory Visit (INDEPENDENT_AMBULATORY_CARE_PROVIDER_SITE_OTHER): Payer: Medicare Other | Admitting: Cardiovascular Disease

## 2018-06-02 VITALS — BP 124/78 | HR 57 | Ht 68.0 in | Wt 184.4 lb

## 2018-06-02 DIAGNOSIS — Z950 Presence of cardiac pacemaker: Secondary | ICD-10-CM

## 2018-06-02 DIAGNOSIS — I442 Atrioventricular block, complete: Secondary | ICD-10-CM | POA: Diagnosis not present

## 2018-06-02 DIAGNOSIS — I43 Cardiomyopathy in diseases classified elsewhere: Secondary | ICD-10-CM | POA: Diagnosis not present

## 2018-06-02 DIAGNOSIS — I25708 Atherosclerosis of coronary artery bypass graft(s), unspecified, with other forms of angina pectoris: Secondary | ICD-10-CM

## 2018-06-02 DIAGNOSIS — Z9289 Personal history of other medical treatment: Secondary | ICD-10-CM

## 2018-06-02 DIAGNOSIS — I10 Essential (primary) hypertension: Secondary | ICD-10-CM

## 2018-06-02 DIAGNOSIS — Z955 Presence of coronary angioplasty implant and graft: Secondary | ICD-10-CM

## 2018-06-02 DIAGNOSIS — E785 Hyperlipidemia, unspecified: Secondary | ICD-10-CM

## 2018-06-02 DIAGNOSIS — I5022 Chronic systolic (congestive) heart failure: Secondary | ICD-10-CM

## 2018-06-02 NOTE — Progress Notes (Signed)
SUBJECTIVE: The patient presents for posthospitalization follow-up.  He underwent PCI with drug-eluting stent placement to the left circumflex as well as orbital atherectomy and drug-eluting stent placement to the LAD on 05/23/2018.  The plan was for dual antiplatelet therapy with aspirin and Plavix in addition to rivaroxaban for 1 month.  The plan was to then stop aspirin after 1 month and continue on Plavix for 1 year total.  He was evaluated by the advanced heart failure team.  Echocardiogram showed LVEF 20 to 25% with severe left ventricular dilatation and diffuse hypokinesis and mild to moderate aortic stenosis.  He is here with his daughter.  He is doing well today.  He only began walking around on Friday.  His appetite is okay.  He denies leg swelling, orthopnea, palpitations, chest pain, and paroxysmal nocturnal dyspnea.  There have been a long gap of him not seeing a cardiologist and when I asked him about why he did not come for follow-up, his daughter told me that her father attributed his medical problems to aging and said "he is hardheaded ".    Review of Systems: As per "subjective", otherwise negative.  No Known Allergies  Current Outpatient Medications  Medication Sig Dispense Refill  . acetaminophen (TYLENOL) 500 MG tablet Take 1,000 mg by mouth 2 (two) times daily as needed (for leg pain/pain).     Marland Kitchen aspirin EC 81 MG tablet Take 1 tablet (81 mg total) by mouth daily for 30 days. 30 tablet 0  . carvedilol (COREG) 12.5 MG tablet Take 1 tablet (12.5 mg total) by mouth 2 (two) times daily with a meal. 180 tablet 3  . clopidogrel (PLAVIX) 75 MG tablet Take 1 tablet (75 mg total) by mouth daily. 30 tablet 11  . Doxylamine Succinate, Sleep, (SLEEP AID PO) Take 1 tablet by mouth at bedtime as needed (for sleep).    . furosemide (LASIX) 40 MG tablet Take 1 tablet (40 mg total) by mouth 2 (two) times daily. 180 tablet 0  . Omega-3 Fatty Acids (FISH OIL) 1000 MG CAPS Take 2,000  mg by mouth daily.     . RaNITidine HCl (ACID REDUCER PO) Take 1 tablet by mouth daily as needed (for heartburn/indigestion).    . rivaroxaban (XARELTO) 20 MG TABS tablet Take 1 tablet (20 mg total) by mouth daily with supper. 30 tablet 0  . rosuvastatin (CRESTOR) 20 MG tablet Take 1 tablet (20 mg total) by mouth daily at 6 PM. 30 tablet 11  . sacubitril-valsartan (ENTRESTO) 24-26 MG Take 1 tablet by mouth 2 (two) times daily. 60 tablet 11  . spironolactone (ALDACTONE) 25 MG tablet Take 0.5 tablets (12.5 mg total) by mouth daily. 15 tablet 11  . potassium chloride SA (K-DUR,KLOR-CON) 20 MEQ tablet Take 1 tablet (20 mEq total) by mouth daily. 7 tablet 0   No current facility-administered medications for this visit.     Past Medical History:  Diagnosis Date  . Arthritis    " all over the body"  . Atrial fibrillation and flutter ([place])    seen on PPM interrogation; Rivaroxaban started 04/2018  . Chronic systolic CHF (congestive heart failure) ([place])    Echo 05/2018: EF 20-25, severe LVE, diffuse HK with septal and apical akinesis, moderate LAE, moderate MAC, severe aortic valve calcification - degree of AS diff to judge with low EF (likely mild to mod)  . Coronary artery disease    s/p CABG 1996 // LHC 05/2018: oLAD 90, pLAD 100  after Dx, LCx 90 at bifurcation of OM1/OM2, mRCA 50; L-LAD ok; S-D1/OM2 100 >> PCI: DES to mLCx and orbital atherectomy and DES to oLAD  . GERD (gastroesophageal reflux disease)   . Hyperlipidemia   . Hypertension   . Mobitz (type) II atrioventricular block    s/p STJ Accent pacemaker implanted by Dr [name] 10-16-2012  . Myocardial infarction [facility])    per pt., told that that the stress test shows that there is a weakening evident on the stress test  . Pacemaker 10/16/2012   St. Jude   . Personal history of colonic polyps-tubular adenomas 08/31/2013  . Vertigo     Past Surgical History:  Procedure Laterality Date  . cardiac bypass  1996   3 vessels  . CATARACT  EXTRACTION W/ INTRAOCULAR LENS  IMPLANT, BILATERAL Bilateral 2010  . COLONOSCOPY    . CORONARY ARTERY BYPASS GRAFT    . CORONARY ATHERECTOMY N/A 05/23/2018   Procedure: CORONARY ATHERECTOMY;  Surgeon: [name], Peter M, MD;  Location: [facility];  Service: Cardiovascular;  Laterality: N/A;  . CORONARY STENT INTERVENTION N/A 05/23/2018   Procedure: CORONARY STENT INTERVENTION;  Surgeon: [name], Peter M, MD;  Location: [place] CV [facility];  Service: Cardiovascular;  Laterality: N/A;  . INGUINAL HERNIA REPAIR Left 2010  . LUMBAR LAMINECTOMY/DECOMPRESSION MICRODISCECTOMY Right 07/19/2016   Procedure: Right Lumbar Four-Five Hemilaminectomy and Bilateral Lumbar Two-Three Laminectomy;  Surgeon: Eustace Moore, MD;  Location: [facility];  Service: Neurosurgery;  Laterality: Right;  Right Lumbar Four-Five Hemilaminectomy and Bilateral Lumbar Two-Three Laminectomy  . LUMBAR SPINE SURGERY  2006  . NASAL SEPTUM SURGERY  09/01/13   Dr. Adriana Reams  . PACEMAKER INSERTION  10-16-2012   STJ Accent dual chamber pacemaker implanted by Dr [name] for symptomatic Mobitz II heart block  . PERMANENT PACEMAKER INSERTION N/A 10/16/2012   Procedure: PERMANENT PACEMAKER INSERTION;  Surgeon: Thompson Grayer, MD;  Location: [facility] CATH LAB;  Service: Cardiovascular;  Laterality: N/A;  . RIGHT/LEFT HEART CATH AND CORONARY/GRAFT ANGIOGRAPHY N/A 05/20/2018   Procedure: RIGHT/LEFT HEART CATH AND CORONARY/GRAFT ANGIOGRAPHY;  Surgeon: [name], Peter M, MD;  Location: [place] CV LAB;  Service: Cardiovascular;  Laterality: N/A;    Social History   Socioeconomic History  . Marital status: Widowed    Spouse name: Not on file  . Number of children: Not on file  . Years of education: Not on file  . Highest education level: Not on file  Occupational History  . Not on file  Social Needs  . Financial resource strain: Not on file  . Food insecurity:    Worry: Not on file    Inability: Not on file  . Transportation needs:    Medical:  Not on file    Non-medical: Not on file  Tobacco Use  . Smoking status: Former Smoker    Packs/day: 2.00    Years: 40.00    Pack years: 80.00    Types: Cigarettes    Last attempt to quit: 04/02/1994    Years since quitting: 24.1  . Smokeless tobacco: Never Used  Substance and Sexual Activity  . Alcohol use: Not Currently    Alcohol/week: 0.0 standard drinks  . Drug use: No  . Sexual activity: Yes  Lifestyle  . Physical activity:    Days per week: Not on file    Minutes per session: Not on file  . Stress: Not on file  Relationships  . Social connections:    Talks on phone: Not on  file    Gets together: Not on file    Attends religious service: Not on file    Active member of club or organization: Not on file    Attends meetings of clubs or organizations: Not on file    Relationship status: Not on file  . Intimate partner violence:    Fear of current or ex partner: Not on file    Emotionally abused: Not on file    Physically abused: Not on file    Forced sexual activity: Not on file  Other Topics Concern  . Not on file  Social History Narrative  . Not on file     Vitals:   06/02/18 1051  BP: 124/78  Pulse: (!) 57  SpO2: 97%  Weight: 184 lb 6.4 oz (83.6 kg)  Height: _0  (1.727 m)    Wt Readings from Last 3 Encounters:  06/02/18 184 lb 6.4 oz (83.6 kg)  05/25/18 177 lb 8 oz (80.5 kg)  05/02/18 186 lb 9.6 oz (84.6 kg)     PHYSICAL EXAM General: NAD HEENT: Normal. Neck: No JVD, no thyromegaly. Lungs: Diffusely diminished breath sounds, no crackles or wheezes. CV: Regular rate and rhythm, normal S1/S2, no S3/S4, no murmur. No pretibial or periankle edema.    Abdomen: Soft, nontender, no distention.  Neurologic: Alert and oriented.  Psych: Normal affect. Skin: Normal. Musculoskeletal: No gross deformities.    ECG: Reviewed above under Subjective   Labs: Lab Results  Component Value Date/Time   K 4.4 05/25/2018 05:36 AM   BUN 47 (H) 05/25/2018  05:36 AM   CREATININE 1.20 05/25/2018 05:36 AM   CREATININE 1.29 (H) 05/09/2018 10:54 AM   ALT 20 01/12/2007 01:55 AM   TSH 1.104 10/03/2012 10:18 PM   HGB 14.1 05/24/2018 02:43 AM     Lipids: Lab Results  Component Value Date/Time   LDLCALC 80 05/21/2018 12:10 AM   CHOL 147 05/21/2018 12:10 AM   TRIG 192 (H) 05/21/2018 12:10 AM   HDL 29 (L) 05/21/2018 12:10 AM       ASSESSMENT AND PLAN: 1.  Coronary artery disease: He underwent PCI with drug-eluting stent placement to the left circumflex as well as orbital atherectomy and drug-eluting stent placement to the LAD on 05/23/2018.  Symptomatically stable.  The plan was for dual antiplatelet therapy with aspirin and Plavix in addition to rivaroxaban for 1 month.  The plan was to then stop aspirin after 1 month and continue on Plavix for 1 year total.  2.  Chronic systolic heart failure: Weight is 184 pounds today, 177 pounds on 05/25/2018.  Symptomatically stable.  EF 20 to 25%.  It was thought that chronic right ventricular pacing could be contributing to cardiomyopathy.  The plan is to see if EF improves with guideline directed medical therapy of coronary disease and heart failure.  If not, it was recommended to consider CRT-D upgrade.  QRS duration 200 ms on 05/24/2018. Continue carvedilol, Lasix 40 mg twice daily, spironolactone 12.5 mg daily, and Entresto 24-26 mg twice daily.  3.  Complete heart block status post permanent pacemaker: It was thought that chronic right ventricular pacing could be contributing to cardiomyopathy.  The plan is to see if EF improves with guideline directed medical therapy of coronary disease and heart failure.  If not, it was recommended to consider CRT-D upgrade.  4.  Hypertension: Blood pressure is normal.  5.  Hyperlipidemia: Continue statin.  6.  Persistent atrial fibrillation: Continue carvedilol and Xarelto.  Disposition: Follow up with Dr. [name] on May 1.  Follow-up with me in August.   Kate Sable, M.D., F.A.C.C.

## 2018-06-02 NOTE — Patient Instructions (Addendum)
Medication Instructions:  Continue all current medications.   Follow-Up: 5 months - August   Any Other Special Instructions Will Be Listed Below (If Applicable).  If you need a refill on your cardiac medications before your next appointment, please call your pharmacy.

## 2018-06-02 NOTE — Telephone Encounter (Signed)
Patient in office this morning for visit with provider.

## 2018-06-18 LAB — CUP PACEART INCLINIC DEVICE CHECK
Battery Remaining Longevity: 111 mo
Battery Voltage: 2.98 V
Brady Statistic RV Percent Paced: 85 %
Date Time Interrogation Session: 20200131212452
Implantable Lead Implant Date: 20140717
Implantable Lead Implant Date: 20140717
Implantable Lead Location: 753859
Implantable Lead Location: 753860
Implantable Pulse Generator Implant Date: 20140717
Lead Channel Impedance Value: 425 Ohm
Lead Channel Impedance Value: 637.5 Ohm
Lead Channel Pacing Threshold Amplitude: 0.5 V
Lead Channel Pacing Threshold Pulse Width: 0.4 ms
Lead Channel Pacing Threshold Pulse Width: 0.4 ms
Lead Channel Sensing Intrinsic Amplitude: 4.2 mV
Lead Channel Sensing Intrinsic Amplitude: 9.9 mV
Lead Channel Setting Pacing Amplitude: 0.75 V
Lead Channel Setting Pacing Amplitude: 2 V
Lead Channel Setting Pacing Pulse Width: 0.4 ms
Lead Channel Setting Sensing Sensitivity: 4 mV
MDC IDC MSMT LEADCHNL RA PACING THRESHOLD AMPLITUDE: 1 V
MDC IDC STAT BRADY RA PERCENT PACED: 54 %
Pulse Gen Model: 2240
Pulse Gen Serial Number: 7519064

## 2018-07-01 ENCOUNTER — Encounter: Payer: Self-pay | Admitting: Internal Medicine

## 2018-07-21 ENCOUNTER — Ambulatory Visit: Payer: Medicare Other | Admitting: *Deleted

## 2018-07-21 ENCOUNTER — Other Ambulatory Visit: Payer: Self-pay

## 2018-07-21 LAB — CUP PACEART REMOTE DEVICE CHECK
Battery Remaining Longevity: 115 mo
Battery Remaining Percentage: 95.5 %
Battery Voltage: 2.98 V
Brady Statistic AP VP Percent: 84 %
Brady Statistic AP VS Percent: 2.3 %
Brady Statistic AS VP Percent: 8.5 %
Brady Statistic AS VS Percent: 1 %
Brady Statistic RA Percent Paced: 79 %
Brady Statistic RV Percent Paced: 92 %
Date Time Interrogation Session: 20200420060013
Implantable Lead Implant Date: 20140717
Implantable Lead Implant Date: 20140717
Implantable Lead Location: 753859
Implantable Lead Location: 753860
Implantable Lead Model: 1948
Implantable Pulse Generator Implant Date: 20140717
Lead Channel Impedance Value: 430 Ohm
Lead Channel Impedance Value: 640 Ohm
Lead Channel Pacing Threshold Amplitude: 0.5 V
Lead Channel Pacing Threshold Amplitude: 1 V
Lead Channel Pacing Threshold Pulse Width: 0.4 ms
Lead Channel Pacing Threshold Pulse Width: 0.4 ms
Lead Channel Sensing Intrinsic Amplitude: 12 mV
Lead Channel Sensing Intrinsic Amplitude: 4.2 mV
Lead Channel Setting Pacing Amplitude: 0.75 V
Lead Channel Setting Pacing Amplitude: 2 V
Lead Channel Setting Pacing Pulse Width: 0.4 ms
Lead Channel Setting Sensing Sensitivity: 4 mV
Pulse Gen Model: 2240
Pulse Gen Serial Number: 7519064

## 2018-07-22 ENCOUNTER — Telehealth: Payer: Self-pay

## 2018-07-22 NOTE — Telephone Encounter (Signed)
Left message for patient to remind of missed remote transmission.  

## 2018-07-24 ENCOUNTER — Telehealth: Payer: Self-pay | Admitting: *Deleted

## 2018-07-24 NOTE — Telephone Encounter (Signed)
LMOM requesting call back regarding Merlin monitor. Monitor has not updated since 04/28/18 (via cell adapter). Scheduled transmission was missed on 07/21/18.

## 2018-07-30 ENCOUNTER — Encounter: Payer: Self-pay | Admitting: Cardiology

## 2018-07-30 ENCOUNTER — Telehealth: Payer: Self-pay

## 2018-07-30 NOTE — Telephone Encounter (Signed)
Left message regarding appt on 08/01/18. 

## 2018-08-01 ENCOUNTER — Encounter: Payer: Medicare Other | Admitting: Internal Medicine

## 2018-08-12 NOTE — Telephone Encounter (Signed)
Attempted to reach pt's daughter--mailbox is full, unable to LM. LMOVM for patient requesting call back to DC.

## 2018-08-13 NOTE — Telephone Encounter (Signed)
LMOVM for pt to send a transmission and to call the [facility] back.

## 2018-08-15 NOTE — Telephone Encounter (Signed)
LMOVM for pt to send a transmission with home monitor and if he has any questions he can call the device clinic at [phone].

## 2018-08-18 NOTE — Telephone Encounter (Signed)
LMOVM requesting that pt send a manual transmission w/ his home monitor.  

## 2018-08-19 NOTE — Telephone Encounter (Signed)
LMOVM for pt to return call / send manual transmission.

## 2018-08-20 NOTE — Telephone Encounter (Signed)
Spoke w/ pt and requested that he send a manual transmission w/ his home monitor. Pt doesn't know how to send transmission. Offered him to call back on my direct number and we will help him send the transmission when he is home. Call was disconnected.

## 2018-08-26 ENCOUNTER — Encounter: Payer: Self-pay | Admitting: Cardiology

## 2018-08-26 NOTE — Telephone Encounter (Signed)
LMOVM for pt to return call. After several unsuccessful attempt letter mailed w/ instructions. Staff message sent to me to follow up in 2 weeks. If transmission has not been received a certified letter will be mailed.

## 2018-09-15 ENCOUNTER — Encounter: Payer: Self-pay | Admitting: Cardiology

## 2018-09-17 ENCOUNTER — Ambulatory Visit: Payer: Medicare Other | Admitting: Cardiology

## 2018-09-17 ENCOUNTER — Encounter: Payer: Self-pay | Admitting: Cardiology

## 2018-09-17 VITALS — BP 108/74 | HR 90 | Temp 98.4°F | Ht 68.0 in | Wt 183.0 lb

## 2018-09-17 DIAGNOSIS — R0602 Shortness of breath: Secondary | ICD-10-CM

## 2018-09-17 NOTE — Patient Instructions (Addendum)
Medication Instructions: Take Lasix 80 mg am and 40 mg pm for the next 3 days, THEN, go back to regular dose of 40 mg twice a day Labwork:  CBC,BMET,BNP  Procedures/Testing: Your physician has requested that you have an echocardiogram. Echocardiography is a painless test that uses sound waves to create images of your heart. It provides your doctor with information about the size and shape of your heart and how well your heart's chambers and valves are working. This procedure takes approximately one hour. There are no restrictions for this procedure.    Follow-Up: Keep apt with Dr.Allred on 10/31/18 at 9 am in the [place] office  Any Additional Special Instructions Will Be Listed Below (If Applicable).     If you need a refill on your cardiac medications before your next appointment, please call your pharmacy.

## 2018-09-17 NOTE — Progress Notes (Signed)
09/17/2018 Ricardo Hunt   1940-05-07  [id number]  Primary Physician Dione Housekeeper, MD Primary Cardiologist: Dr Bronson Ing  HPI: Ricardo Hunt is a 78 year old male with a history of remote CABG in 1996.  His last PCI was May 23, 2018, he had an LAD and circumflex intervention with DES.  Other medical problems include moderate aortic stenosis, PAF, complete heart block status post [facility] pacemaker in July 2014, and ischemic cardiomyopathy with an EF of 20 to 25%.  His last office visit was in March 2020.  Dr. Bronson Ing had plan to reevaluate his LV function after 3 months of good medical therapy.  If his ejection fraction did not improve then the patient would be considered for CRT upgrade.  The patient was supposed to see Dr. [name] in May but with the [place] pandemic this visit was rescheduled for July.    The patient called the office today with complaints of dyspnea on exertion.  He says he is unable to do anything without being short of breath.  He denies orthopnea or lower extremity edema.  His weight is similar to his office visit weight on 06/02/2018.  On exam he does have some basilar rales and elevated JVD.   Current Outpatient Medications  Medication Sig Dispense Refill  . acetaminophen (TYLENOL) 500 MG tablet Take 1,000 mg by mouth 2 (two) times daily as needed (for leg pain/pain).     . carvedilol (COREG) 12.5 MG tablet Take 1 tablet (12.5 mg total) by mouth 2 (two) times daily with a meal. 180 tablet 3  . clopidogrel (PLAVIX) 75 MG tablet Take 1 tablet (75 mg total) by mouth daily. 30 tablet 11  . Doxylamine Succinate, Sleep, (SLEEP AID PO) Take 1 tablet by mouth at bedtime as needed (for sleep).    . furosemide (LASIX) 40 MG tablet Take 1 tablet (40 mg total) by mouth 2 (two) times daily. 180 tablet 0  . Omega-3 Fatty Acids (FISH OIL) 1000 MG CAPS Take 2,000 mg by mouth daily.     . RaNITidine HCl (ACID REDUCER PO) Take 1 tablet by mouth daily as needed (for  heartburn/indigestion).    . rivaroxaban (XARELTO) 20 MG TABS tablet Take 1 tablet (20 mg total) by mouth daily with supper. 30 tablet 0  . rosuvastatin (CRESTOR) 20 MG tablet Take 1 tablet (20 mg total) by mouth daily at 6 PM. 30 tablet 11  . sacubitril-valsartan (ENTRESTO) 24-26 MG Take 1 tablet by mouth 2 (two) times daily. 60 tablet 11  . spironolactone (ALDACTONE) 25 MG tablet Take 0.5 tablets (12.5 mg total) by mouth daily. 15 tablet 11  . potassium chloride SA (K-DUR,KLOR-CON) 20 MEQ tablet Take 1 tablet (20 mEq total) by mouth daily. 7 tablet 0   No current facility-administered medications for this visit.     No Known Allergies  Past Medical History:  Diagnosis Date  . Arthritis    " all over the body"  . Atrial fibrillation and flutter ([place])    seen on PPM interrogation; Rivaroxaban started 04/2018  . Chronic systolic CHF (congestive heart failure) ([place])    Echo 05/2018: EF 20-25, severe LVE, diffuse HK with septal and apical akinesis, moderate LAE, moderate MAC, severe aortic valve calcification - degree of AS diff to judge with low EF (likely mild to mod)  . Coronary artery disease    s/p CABG 1996 // LHC 05/2018: oLAD 90, pLAD 100 after Dx, LCx 90 at bifurcation of OM1/OM2, mRCA 50; L-LAD  ok; S-D1/OM2 100 >> PCI: DES to mLCx and orbital atherectomy and DES to oLAD  . GERD (gastroesophageal reflux disease)   . Hyperlipidemia   . Hypertension   . Mobitz (type) II atrioventricular block    s/p STJ Accent pacemaker implanted by Dr [name] 10-16-2012  . Myocardial infarction [facility])    per pt., told that that the stress test shows that there is a weakening evident on the stress test  . Pacemaker 10/16/2012   St. Jude   . Personal history of colonic polyps-tubular adenomas 08/31/2013  . Vertigo     Social History   Socioeconomic History  . Marital status: Widowed    Spouse name: Not on file  . Number of children: Not on file  . Years of education: Not on file  . Highest  education level: Not on file  Occupational History  . Not on file  Social Needs  . Financial resource strain: Not on file  . Food insecurity    Worry: Not on file    Inability: Not on file  . Transportation needs    Medical: Not on file    Non-medical: Not on file  Tobacco Use  . Smoking status: Former Smoker    Packs/day: 2.00    Years: 40.00    Pack years: 80.00    Types: Cigarettes    Quit date: 04/02/1994    Years since quitting: 24.4  . Smokeless tobacco: Never Used  Substance and Sexual Activity  . Alcohol use: Not Currently    Alcohol/week: 0.0 standard drinks  . Drug use: No  . Sexual activity: Yes  Lifestyle  . Physical activity    Days per week: Not on file    Minutes per session: Not on file  . Stress: Not on file  Relationships  . Social Herbalist on phone: Not on file    Gets together: Not on file    Attends religious service: Not on file    Active member of club or organization: Not on file    Attends meetings of clubs or organizations: Not on file    Relationship status: Not on file  . Intimate partner violence    Fear of current or ex partner: Not on file    Emotionally abused: Not on file    Physically abused: Not on file    Forced sexual activity: Not on file  Other Topics Concern  . Not on file  Social History Narrative  . Not on file     Family History  Problem Relation Age of Onset  . CAD Father   . Hypertension Father   . CAD Mother   . Diabetes Mother   . Breast cancer Other        Niece  . Colon cancer Neg Hx   . Pancreatic cancer Neg Hx   . Stomach cancer Neg Hx      Review of Systems: General: negative for chills, fever, night sweats or weight changes.  Cardiovascular: negative for chest pain, dyspnea on exertion, edema, orthopnea, palpitations, paroxysmal nocturnal dyspnea or shortness of breath Dermatological: negative for rash Respiratory: negative for cough or wheezing Urologic: negative for hematuria  Abdominal: negative for nausea, vomiting, diarrhea, bright red blood per rectum, melena, or hematemesis Neurologic: negative for visual changes, syncope, or dizziness All other systems reviewed and are otherwise negative except as noted above.    Blood pressure 108/74, pulse 90, temperature 98.4 F (36.9 C), height _0  (1.727 m),  weight 183 lb (83 kg), SpO2 94 %.  General appearance: alert, cooperative, appears stated age and no distress Neck: elevated JVD Lungs: decreased at bases, Lt > Rt Heart: irregularly irregular rhythm Extremities: no edema Skin: warm and dry Neurologic: Grossly normal   ASSESSMENT AND PLAN:   Acute on chronic systolic CHF- He appears to have some volume overload on exam and has symptoms of low out put.  I suspect his EF is not improved c/w 05/21/2018.  ICM- EF 20-25% by echo 05/21/2018  CAD- LAD and CFX PCI with DES 05/23/2018  CABG- 1996-patent LIMA-LAD, occluded SVG to OM  PAF- On Xarelto and Plavix  CHB- St J PTVDP 2014   PLAN  Increase Lasix to 80/40 for 3 days.  Check echo. Check BMP. BNP. CBC today.  Keep apt with Dr [name] in Aug to evaluate possibility of CRT upgrade depending on his LVF at echo.   [name] PA-C 09/17/2018 2:25 PM

## 2018-09-19 LAB — BASIC METABOLIC PANEL
BUN/Creatinine Ratio: 18 (calc) (ref 6–22)
BUN: 24 mg/dL (ref 7–25)
CO2: 29 mmol/L (ref 20–32)
Calcium: 9.6 mg/dL (ref 8.6–10.3)
Chloride: 101 mmol/L (ref 98–110)
Creat: 1.31 mg/dL — ABNORMAL HIGH (ref 0.70–1.18)
Glucose, Bld: 126 mg/dL (ref 65–139)
Potassium: 4.2 mmol/L (ref 3.5–5.3)
Sodium: 140 mmol/L (ref 135–146)

## 2018-09-19 LAB — BRAIN NATRIURETIC PEPTIDE: Brain Natriuretic Peptide: 578 pg/mL — ABNORMAL HIGH (ref <100)

## 2018-09-19 LAB — CBC
HCT: 49.5 % (ref 38.5–50.0)
Hemoglobin: 16.7 g/dL (ref 13.2–17.1)
MCH: 32.2 pg (ref 27.0–33.0)
MCHC: 33.7 g/dL (ref 32.0–36.0)
MCV: 95.4 fL (ref 80.0–100.0)
MPV: 11.1 fL (ref 7.5–12.5)
Platelets: 180 10*3/uL (ref 140–400)
RBC: 5.19 10*6/uL (ref 4.20–5.80)
RDW: 13.7 % (ref 11.0–15.0)
WBC: 7.5 10*3/uL (ref 3.8–10.8)

## 2018-09-23 ENCOUNTER — Ambulatory Visit (HOSPITAL_COMMUNITY)
Admission: RE | Admit: 2018-09-23 | Discharge: 2018-09-23 | Disposition: A | Discharge status: Home / Self Care | Payer: Medicare Other | Source: Ambulatory Visit | Attending: Cardiology | Admitting: Cardiology

## 2018-09-23 ENCOUNTER — Other Ambulatory Visit: Payer: Self-pay

## 2018-09-23 DIAGNOSIS — R0602 Shortness of breath: Secondary | ICD-10-CM | POA: Insufficient documentation

## 2018-09-23 NOTE — Progress Notes (Signed)
*  PRELIMINARY RESULTS* Echocardiogram 2D Echocardiogram has been performed.  Ricardo Hunt 09/23/2018, 12:29 PM

## 2018-09-24 ENCOUNTER — Telehealth: Payer: Self-pay | Admitting: Cardiology

## 2018-09-24 NOTE — Telephone Encounter (Signed)
Called to discuss echo. LMTCB.  [name] PA-C 09/24/2018 2:30 PM

## 2018-09-30 NOTE — Telephone Encounter (Signed)
Follow up    Patients daughter Vista Deck is returning call in reference to echo results.

## 2018-10-01 ENCOUNTER — Telehealth: Payer: Self-pay | Admitting: Cardiology

## 2018-10-01 NOTE — Telephone Encounter (Signed)
Erlene Quan, PA-C  [place], Erik Obey, LPN  Caller: Unspecified (1 week ago, 2:30 PM)        I've called his home twice and cell-no answer. No number for his daughter listed. If he calls back please let him know his labs looked OK. His echo still shows his heart is weak. Keep f/u with Dr [name] as scheduled to discuss pacemaker upgrade.   Thanks   [name] PA-C  10/01/2018  9:07 AM    DAUGHTER- LISA BULLINS notified she will make sure pt goes to scheduled appt with Dr [name] end of July.

## 2018-10-01 NOTE — Telephone Encounter (Signed)
I've called his home twice and cell-no answer.  The number listed  for his daughter does not work.  If he calls back please let him know his labs looked OK. His echo still shows his heart is weak.  Keep f/u with Dr [name] as scheduled to discuss pacemaker upgrade.  [name] PA-C 10/01/2018 9:08 AM

## 2018-10-20 ENCOUNTER — Encounter (HOSPITAL_COMMUNITY): Payer: Self-pay | Admitting: Cardiology

## 2018-10-28 ENCOUNTER — Telehealth: Payer: Self-pay

## 2018-10-28 NOTE — Telephone Encounter (Signed)
LMOVM for the pt to send a manual transmission with his home monitor. I left my direct office number for the pt to call if he has any questions.

## 2018-10-29 NOTE — Telephone Encounter (Signed)
LMOVM

## 2018-10-30 NOTE — Telephone Encounter (Signed)
LMOVM

## 2018-10-31 ENCOUNTER — Ambulatory Visit (INDEPENDENT_AMBULATORY_CARE_PROVIDER_SITE_OTHER): Payer: Medicare Other | Admitting: Internal Medicine

## 2018-10-31 DIAGNOSIS — I442 Atrioventricular block, complete: Secondary | ICD-10-CM | POA: Diagnosis not present

## 2018-10-31 DIAGNOSIS — I5022 Chronic systolic (congestive) heart failure: Secondary | ICD-10-CM | POA: Diagnosis not present

## 2018-10-31 DIAGNOSIS — I4819 Other persistent atrial fibrillation: Secondary | ICD-10-CM

## 2018-10-31 DIAGNOSIS — I25708 Atherosclerosis of coronary artery bypass graft(s), unspecified, with other forms of angina pectoris: Secondary | ICD-10-CM | POA: Diagnosis not present

## 2018-10-31 NOTE — Telephone Encounter (Signed)
I attempted to call the pt 3 times. I have sent a letter for the pt to send a manual transmission with his home monitor.

## 2018-10-31 NOTE — Progress Notes (Signed)
Electrophysiology TeleHealth Note   Due to national recommendations of social distancing due to [place] 19, an audio telehealth visit is felt to be most appropriate for this patient at this time.  Verbal consent was obtained by me for the telehealth visit today.  The patient does not have capability for a virtual visit.  A phone visit is therefore required today.   Date:  10/31/2018   ID:  Ricardo Hunt, DOB [birth year], MRN [id number]  Location: patient's home  Provider location:  [facility]  Evaluation Performed: Follow-up visit  PCP:  Dione Housekeeper, MD   Electrophysiologist:  Dr [name]  Chief Complaint:  Device follow up  History of Present Illness:    Ricardo Hunt is a 78 y.o. male who presents via telehealth conferencing today.  Since last being seen in our clinic, the patient reports ongoing shortness of breath with exertion.   Today, he denies symptoms of palpitations, chest pain, lower extremity edema, dizziness, presyncope, or syncope.  The patient is otherwise without complaint today.  The patient denies symptoms of fevers, chills, cough, or new SOB worrisome for COVID 19.  Past Medical History:  Diagnosis Date  . Arthritis    " all over the body"  . Atrial fibrillation and flutter ([place])    seen on PPM interrogation; Rivaroxaban started 04/2018  . Chronic systolic CHF (congestive heart failure) ([place])    Echo 05/2018: EF 20-25, severe LVE, diffuse HK with septal and apical akinesis, moderate LAE, moderate MAC, severe aortic valve calcification - degree of AS diff to judge with low EF (likely mild to mod)  . Coronary artery disease    s/p CABG 1996 // LHC 05/2018: oLAD 90, pLAD 100 after Dx, LCx 90 at bifurcation of OM1/OM2, mRCA 50; L-LAD ok; S-D1/OM2 100 >> PCI: DES to mLCx and orbital atherectomy and DES to oLAD  . GERD (gastroesophageal reflux disease)   . Hyperlipidemia   . Hypertension   . Mobitz (type) II atrioventricular block    s/p STJ Accent pacemaker  implanted by Dr [name] 10-16-2012  . Myocardial infarction [facility])    per pt., told that that the stress test shows that there is a weakening evident on the stress test  . Pacemaker 10/16/2012   St. Jude   . Personal history of colonic polyps-tubular adenomas 08/31/2013  . Vertigo     Past Surgical History:  Procedure Laterality Date  . cardiac bypass  1996   3 vessels  . CATARACT EXTRACTION W/ INTRAOCULAR LENS  IMPLANT, BILATERAL Bilateral 2010  . COLONOSCOPY    . CORONARY ARTERY BYPASS GRAFT    . CORONARY ATHERECTOMY N/A 05/23/2018   Procedure: CORONARY ATHERECTOMY;  Surgeon: [name], Peter M, MD;  Location: [place] CV LAB;  Service: Cardiovascular;  Laterality: N/A;  . CORONARY STENT INTERVENTION N/A 05/23/2018   Procedure: CORONARY STENT INTERVENTION;  Surgeon: [name], Peter M, MD;  Location: [place] CV LAB;  Service: Cardiovascular;  Laterality: N/A;  . INGUINAL HERNIA REPAIR Left 2010  . LUMBAR LAMINECTOMY/DECOMPRESSION MICRODISCECTOMY Right 07/19/2016   Procedure: Right Lumbar Four-Five Hemilaminectomy and Bilateral Lumbar Two-Three Laminectomy;  Surgeon: Eustace Moore, MD;  Location: [place];  Service: Neurosurgery;  Laterality: Right;  Right Lumbar Four-Five Hemilaminectomy and Bilateral Lumbar Two-Three Laminectomy  . LUMBAR SPINE SURGERY  2006  . NASAL SEPTUM SURGERY  09/01/13   Dr. Adriana Reams  . PACEMAKER INSERTION  10-16-2012   STJ Accent dual chamber pacemaker implanted by Dr [name] for  symptomatic Mobitz II heart block  . PERMANENT PACEMAKER INSERTION N/A 10/16/2012   Procedure: PERMANENT PACEMAKER INSERTION;  Surgeon: Thompson Grayer, MD;  Location[facility] CATH LAB;  Service: Cardiovascular;  Laterality: N/A;  . RIGHT/LEFT HEART CATH AND CORONARY/GRAFT ANGIOGRAPHY N/A 05/20/2018   Procedure: RIGHT/LEFT HEART CATH AND CORONARY/GRAFT ANGIOGRAPHY;  Surgeon: [name], Peter M, MD;  Location: [place] CV LAB;  Service: Cardiovascular;  Laterality: N/A;    Current Outpatient  Medications  Medication Sig Dispense Refill  . acetaminophen (TYLENOL) 500 MG tablet Take 1,000 mg by mouth 2 (two) times daily as needed (for leg pain/pain).     . carvedilol (COREG) 12.5 MG tablet Take 1 tablet (12.5 mg total) by mouth 2 (two) times daily with a meal. 180 tablet 3  . clopidogrel (PLAVIX) 75 MG tablet Take 1 tablet (75 mg total) by mouth daily. 30 tablet 11  . Doxylamine Succinate, Sleep, (SLEEP AID PO) Take 1 tablet by mouth at bedtime as needed (for sleep).    . furosemide (LASIX) 40 MG tablet Take 1 tablet (40 mg total) by mouth 2 (two) times daily. 180 tablet 0  . Omega-3 Fatty Acids (FISH OIL) 1000 MG CAPS Take 2,000 mg by mouth daily.     . potassium chloride SA (K-DUR,KLOR-CON) 20 MEQ tablet Take 1 tablet (20 mEq total) by mouth daily. 7 tablet 0  . RaNITidine HCl (ACID REDUCER PO) Take 1 tablet by mouth daily as needed (for heartburn/indigestion).    . rivaroxaban (XARELTO) 20 MG TABS tablet Take 1 tablet (20 mg total) by mouth daily with supper. 30 tablet 0  . rosuvastatin (CRESTOR) 20 MG tablet Take 1 tablet (20 mg total) by mouth daily at 6 PM. 30 tablet 11  . sacubitril-valsartan (ENTRESTO) 24-26 MG Take 1 tablet by mouth 2 (two) times daily. 60 tablet 11  . spironolactone (ALDACTONE) 25 MG tablet Take 0.5 tablets (12.5 mg total) by mouth daily. 15 tablet 11   No current facility-administered medications for this visit.     Allergies:   Patient has no known allergies.   Social History:  The patient  reports that he quit smoking about 24 years ago. His smoking use included cigarettes. He has a 80.00 pack-year smoking history. He has never used smokeless tobacco. He reports previous alcohol use. He reports that he does not use drugs.   Family History:  The patient's  family history includes Breast cancer in an other family member; CAD in his father and mother; Diabetes in his mother; Hypertension in his father.   ROS:  Please see the history of present illness.    All other systems are personally reviewed and negative.    Exam:    Vital Signs:  There were no vitals taken for this visit.  Well sounding    Labs/Other Tests and Data Reviewed:    Recent Labs: 09/18/2018: Brain Natriuretic Peptide 578; BUN 24; Creat 1.31; Hemoglobin 16.7; Platelets 180; Potassium 4.2; Sodium 140   Wt Readings from Last 3 Encounters:  09/17/18 183 lb (83 kg)  06/02/18 184 lb 6.4 oz (83.6 kg)  05/25/18 177 lb 8 oz (80.5 kg)     Last device remote is reviewed from [facility] PDF which reveals normal device function, no arrhythmias     ASSESSMENT & PLAN:    1.  Complete heart block Normal device function by last check in January - we have asked him to send a remote transmission  2.  ICM/chronic systolic heart failure EF remains depressed  despite medical therapy He is symptomatic with shortness of breath on exertion Risks, benefits, alternatives to CRTP vs CRTD upgrade were discussed with the patient and daughter in detail with the patient today. They prefer CRTP upgrade after a shared decision making process. The patient understands that the risks include but are not limited to bleeding, infection, pneumothorax, perforation, tamponade, vascular damage, renal failure, MI, stroke, death, inappropriate shocks, and lead dislodgement and wishes to proceed.  We will therefore schedule device implantation at the next available time. Hold Xarelto for 24 hours prior to procedure   3.  CAD Stable No change required today  4.  Persistent atrial fibrillation Continue Xarelto for CHADS2VASC of 5    Follow-up:  After CRTP upgrade    Patient Risk:  after full review of this patients clinical status, I feel that they are at moderate risk at this time.  Today, I have spent 15 minutes with the patient with telehealth technology discussing arrhythmia management .    Army Fossa, MD  10/31/2018 9:01 AM     [facility] [address]  [place] [address] [phone] (office) [phone] (fax)

## 2018-11-04 ENCOUNTER — Other Ambulatory Visit: Payer: Self-pay | Admitting: Cardiovascular Disease

## 2018-11-06 ENCOUNTER — Encounter: Payer: Self-pay | Admitting: Cardiology

## 2018-11-06 NOTE — Telephone Encounter (Signed)
Certified letter mailed 11/06/2018

## 2018-11-13 ENCOUNTER — Other Ambulatory Visit: Payer: Self-pay | Admitting: Internal Medicine

## 2018-11-13 MED ORDER — ROSUVASTATIN CALCIUM 20 MG PO TABS: 20.0000 mg | ORAL_TABLET | Freq: Every day | ORAL | 3 refills | Status: DC

## 2018-11-16 ENCOUNTER — Other Ambulatory Visit: Payer: Self-pay

## 2018-11-16 ENCOUNTER — Emergency Department (HOSPITAL_COMMUNITY): Payer: Medicare Other

## 2018-11-16 ENCOUNTER — Inpatient Hospital Stay (HOSPITAL_COMMUNITY)
Admission: EM | Admit: 2018-11-16 | Discharge: 2018-11-27 | DRG: 242 | Disposition: A | Discharge status: Home / Self Care | Payer: Medicare Other | Attending: Internal Medicine | Admitting: Internal Medicine

## 2018-11-16 ENCOUNTER — Encounter (HOSPITAL_COMMUNITY): Payer: Self-pay

## 2018-11-16 DIAGNOSIS — I442 Atrioventricular block, complete: Secondary | ICD-10-CM | POA: Diagnosis present

## 2018-11-16 DIAGNOSIS — I509 Heart failure, unspecified: Secondary | ICD-10-CM | POA: Diagnosis not present

## 2018-11-16 DIAGNOSIS — Z951 Presence of aortocoronary bypass graft: Secondary | ICD-10-CM

## 2018-11-16 DIAGNOSIS — Z7902 Long term (current) use of antithrombotics/antiplatelets: Secondary | ICD-10-CM

## 2018-11-16 DIAGNOSIS — N183 Chronic kidney disease, stage 3 unspecified: Secondary | ICD-10-CM

## 2018-11-16 DIAGNOSIS — I4892 Unspecified atrial flutter: Secondary | ICD-10-CM | POA: Diagnosis present

## 2018-11-16 DIAGNOSIS — I130 Hypertensive heart and chronic kidney disease with heart failure and stage 1 through stage 4 chronic kidney disease, or unspecified chronic kidney disease: Secondary | ICD-10-CM | POA: Diagnosis not present

## 2018-11-16 DIAGNOSIS — Z87891 Personal history of nicotine dependence: Secondary | ICD-10-CM

## 2018-11-16 DIAGNOSIS — Z7952 Long term (current) use of systemic steroids: Secondary | ICD-10-CM

## 2018-11-16 DIAGNOSIS — I4891 Unspecified atrial fibrillation: Secondary | ICD-10-CM | POA: Diagnosis present

## 2018-11-16 DIAGNOSIS — Z791 Long term (current) use of non-steroidal anti-inflammatories (NSAID): Secondary | ICD-10-CM

## 2018-11-16 DIAGNOSIS — D696 Thrombocytopenia, unspecified: Secondary | ICD-10-CM | POA: Diagnosis present

## 2018-11-16 DIAGNOSIS — I255 Ischemic cardiomyopathy: Secondary | ICD-10-CM | POA: Diagnosis present

## 2018-11-16 DIAGNOSIS — N179 Acute kidney failure, unspecified: Secondary | ICD-10-CM | POA: Diagnosis present

## 2018-11-16 DIAGNOSIS — I5023 Acute on chronic systolic (congestive) heart failure: Secondary | ICD-10-CM | POA: Diagnosis present

## 2018-11-16 DIAGNOSIS — I252 Old myocardial infarction: Secondary | ICD-10-CM

## 2018-11-16 DIAGNOSIS — Z20828 Contact with and (suspected) exposure to other viral communicable diseases: Secondary | ICD-10-CM | POA: Diagnosis present

## 2018-11-16 DIAGNOSIS — I350 Nonrheumatic aortic (valve) stenosis: Secondary | ICD-10-CM | POA: Diagnosis present

## 2018-11-16 DIAGNOSIS — I959 Hypotension, unspecified: Secondary | ICD-10-CM | POA: Diagnosis present

## 2018-11-16 DIAGNOSIS — Z959 Presence of cardiac and vascular implant and graft, unspecified: Secondary | ICD-10-CM

## 2018-11-16 DIAGNOSIS — Z955 Presence of coronary angioplasty implant and graft: Secondary | ICD-10-CM

## 2018-11-16 DIAGNOSIS — Z8249 Family history of ischemic heart disease and other diseases of the circulatory system: Secondary | ICD-10-CM

## 2018-11-16 DIAGNOSIS — I480 Paroxysmal atrial fibrillation: Secondary | ICD-10-CM | POA: Diagnosis present

## 2018-11-16 DIAGNOSIS — Z833 Family history of diabetes mellitus: Secondary | ICD-10-CM

## 2018-11-16 DIAGNOSIS — I25119 Atherosclerotic heart disease of native coronary artery with unspecified angina pectoris: Secondary | ICD-10-CM | POA: Diagnosis present

## 2018-11-16 DIAGNOSIS — Z7901 Long term (current) use of anticoagulants: Secondary | ICD-10-CM

## 2018-11-16 DIAGNOSIS — M109 Gout, unspecified: Secondary | ICD-10-CM | POA: Diagnosis present

## 2018-11-16 DIAGNOSIS — E785 Hyperlipidemia, unspecified: Secondary | ICD-10-CM | POA: Diagnosis present

## 2018-11-16 LAB — BASIC METABOLIC PANEL
Anion gap: 11 (ref 5–15)
BUN: 27 mg/dL — ABNORMAL HIGH (ref 8–23)
CO2: 24 mmol/L (ref 22–32)
Calcium: 9.6 mg/dL (ref 8.9–10.3)
Chloride: 103 mmol/L (ref 98–111)
Creatinine, Ser: 1.37 mg/dL — ABNORMAL HIGH (ref 0.61–1.24)
GFR calc Af Amer: 56 mL/min — ABNORMAL LOW (ref >60)
GFR calc non Af Amer: 48 mL/min — ABNORMAL LOW (ref >60)
Glucose, Bld: 156 mg/dL — ABNORMAL HIGH (ref 70–99)
Potassium: 4.6 mmol/L (ref 3.5–5.1)
Sodium: 138 mmol/L (ref 135–145)

## 2018-11-16 LAB — CBC
HCT: 48.2 % (ref 39.0–52.0)
Hemoglobin: 15.9 g/dL (ref 13.0–17.0)
MCH: 32.3 pg (ref 26.0–34.0)
MCHC: 33 g/dL (ref 30.0–36.0)
MCV: 98 fL (ref 80.0–100.0)
Platelets: 216 10*3/uL (ref 150–400)
RBC: 4.92 MIL/uL (ref 4.22–5.81)
RDW: 15 % (ref 11.5–15.5)
WBC: 10.4 10*3/uL (ref 4.0–10.5)
nRBC: 0 % (ref 0.0–0.2)

## 2018-11-16 LAB — TROPONIN I (HIGH SENSITIVITY): Troponin I (High Sensitivity): 24 ng/L — ABNORMAL HIGH (ref <18)

## 2018-11-16 LAB — BRAIN NATRIURETIC PEPTIDE: B Natriuretic Peptide: 783.8 pg/mL — ABNORMAL HIGH (ref 0.0–100.0)

## 2018-11-16 MED ORDER — SODIUM CHLORIDE 0.9% FLUSH
3.0000 mL | Freq: Once | INTRAVENOUS | Status: AC
  Administered 2018-11-16: 3 mL via INTRAVENOUS

## 2018-11-16 MED ORDER — FUROSEMIDE 10 MG/ML IJ SOLN
40.0000 mg | Freq: Once | INTRAMUSCULAR | Status: AC
  Administered 2018-11-17: 40 mg via INTRAVENOUS
  Filled 2018-11-16: qty 4

## 2018-11-16 NOTE — ED Triage Notes (Signed)
Pt states that he has SOB that has been going on for 3 months, worse today with exertion, mild swelling in ankles.

## 2018-11-17 ENCOUNTER — Encounter (HOSPITAL_COMMUNITY): Payer: Self-pay | Admitting: Family Medicine

## 2018-11-17 DIAGNOSIS — I5023 Acute on chronic systolic (congestive) heart failure: Secondary | ICD-10-CM | POA: Diagnosis present

## 2018-11-17 DIAGNOSIS — Z8249 Family history of ischemic heart disease and other diseases of the circulatory system: Secondary | ICD-10-CM | POA: Diagnosis not present

## 2018-11-17 DIAGNOSIS — I442 Atrioventricular block, complete: Secondary | ICD-10-CM | POA: Diagnosis present

## 2018-11-17 DIAGNOSIS — Z7901 Long term (current) use of anticoagulants: Secondary | ICD-10-CM | POA: Diagnosis not present

## 2018-11-17 DIAGNOSIS — N179 Acute kidney failure, unspecified: Secondary | ICD-10-CM | POA: Diagnosis present

## 2018-11-17 DIAGNOSIS — I480 Paroxysmal atrial fibrillation: Secondary | ICD-10-CM | POA: Diagnosis present

## 2018-11-17 DIAGNOSIS — Z7902 Long term (current) use of antithrombotics/antiplatelets: Secondary | ICD-10-CM | POA: Diagnosis not present

## 2018-11-17 DIAGNOSIS — I4891 Unspecified atrial fibrillation: Secondary | ICD-10-CM | POA: Diagnosis not present

## 2018-11-17 DIAGNOSIS — N183 Chronic kidney disease, stage 3 unspecified: Secondary | ICD-10-CM | POA: Diagnosis present

## 2018-11-17 DIAGNOSIS — Z955 Presence of coronary angioplasty implant and graft: Secondary | ICD-10-CM | POA: Diagnosis not present

## 2018-11-17 DIAGNOSIS — I252 Old myocardial infarction: Secondary | ICD-10-CM | POA: Diagnosis not present

## 2018-11-17 DIAGNOSIS — E785 Hyperlipidemia, unspecified: Secondary | ICD-10-CM | POA: Diagnosis present

## 2018-11-17 DIAGNOSIS — I255 Ischemic cardiomyopathy: Secondary | ICD-10-CM | POA: Diagnosis present

## 2018-11-17 DIAGNOSIS — Z791 Long term (current) use of non-steroidal anti-inflammatories (NSAID): Secondary | ICD-10-CM | POA: Diagnosis not present

## 2018-11-17 DIAGNOSIS — M109 Gout, unspecified: Secondary | ICD-10-CM | POA: Diagnosis present

## 2018-11-17 DIAGNOSIS — I509 Heart failure, unspecified: Secondary | ICD-10-CM | POA: Diagnosis present

## 2018-11-17 DIAGNOSIS — I25119 Atherosclerotic heart disease of native coronary artery with unspecified angina pectoris: Secondary | ICD-10-CM

## 2018-11-17 DIAGNOSIS — Z7952 Long term (current) use of systemic steroids: Secondary | ICD-10-CM | POA: Diagnosis not present

## 2018-11-17 DIAGNOSIS — Z951 Presence of aortocoronary bypass graft: Secondary | ICD-10-CM | POA: Diagnosis not present

## 2018-11-17 DIAGNOSIS — I130 Hypertensive heart and chronic kidney disease with heart failure and stage 1 through stage 4 chronic kidney disease, or unspecified chronic kidney disease: Secondary | ICD-10-CM | POA: Diagnosis present

## 2018-11-17 DIAGNOSIS — Z20828 Contact with and (suspected) exposure to other viral communicable diseases: Secondary | ICD-10-CM | POA: Diagnosis present

## 2018-11-17 DIAGNOSIS — Z87891 Personal history of nicotine dependence: Secondary | ICD-10-CM | POA: Diagnosis not present

## 2018-11-17 DIAGNOSIS — I959 Hypotension, unspecified: Secondary | ICD-10-CM | POA: Diagnosis present

## 2018-11-17 DIAGNOSIS — I5022 Chronic systolic (congestive) heart failure: Secondary | ICD-10-CM | POA: Diagnosis not present

## 2018-11-17 DIAGNOSIS — I4892 Unspecified atrial flutter: Secondary | ICD-10-CM | POA: Diagnosis present

## 2018-11-17 DIAGNOSIS — D696 Thrombocytopenia, unspecified: Secondary | ICD-10-CM | POA: Diagnosis present

## 2018-11-17 DIAGNOSIS — I350 Nonrheumatic aortic (valve) stenosis: Secondary | ICD-10-CM | POA: Diagnosis present

## 2018-11-17 LAB — TROPONIN I (HIGH SENSITIVITY): Troponin I (High Sensitivity): 27 ng/L — ABNORMAL HIGH (ref <18)

## 2018-11-17 LAB — SARS CORONAVIRUS 2 (TAT 6-24 HRS): SARS Coronavirus 2: NEGATIVE

## 2018-11-17 MED ORDER — SODIUM CHLORIDE 0.9 % IV SOLN: 250.0000 mL | INTRAVENOUS | Status: DC | PRN

## 2018-11-17 MED ORDER — CLOPIDOGREL BISULFATE 75 MG PO TABS
75.0000 mg | ORAL_TABLET | Freq: Every day | ORAL | Status: DC
  Administered 2018-11-17 – 2018-11-27 (×11): 75 mg via ORAL
  Filled 2018-11-17 (×11): qty 1

## 2018-11-17 MED ORDER — FUROSEMIDE 10 MG/ML IJ SOLN
40.0000 mg | Freq: Two times a day (BID) | INTRAMUSCULAR | Status: DC
  Administered 2018-11-17 (×3): 40 mg via INTRAVENOUS
  Filled 2018-11-17 (×3): qty 4

## 2018-11-17 MED ORDER — SODIUM CHLORIDE 0.9% FLUSH
3.0000 mL | Freq: Two times a day (BID) | INTRAVENOUS | Status: DC
  Administered 2018-11-17 – 2018-11-21 (×10): 3 mL via INTRAVENOUS

## 2018-11-17 MED ORDER — CARVEDILOL 12.5 MG PO TABS
12.5000 mg | ORAL_TABLET | Freq: Two times a day (BID) | ORAL | Status: DC
  Administered 2018-11-17 – 2018-11-18 (×4): 12.5 mg via ORAL
  Filled 2018-11-17 (×5): qty 1

## 2018-11-17 MED ORDER — DIPHENHYDRAMINE HCL 12.5 MG/5ML PO ELIX
12.5000 mg | ORAL_SOLUTION | Freq: Once | ORAL | Status: AC
  Administered 2018-11-17: 12.5 mg via ORAL
  Filled 2018-11-17: qty 5

## 2018-11-17 MED ORDER — ROSUVASTATIN CALCIUM 20 MG PO TABS
20.0000 mg | ORAL_TABLET | Freq: Every day | ORAL | Status: DC
  Administered 2018-11-17 – 2018-11-26 (×10): 20 mg via ORAL
  Filled 2018-11-17 (×11): qty 1

## 2018-11-17 MED ORDER — SACUBITRIL-VALSARTAN 24-26 MG PO TABS
1.0000 | ORAL_TABLET | Freq: Two times a day (BID) | ORAL | Status: DC
  Administered 2018-11-17 – 2018-11-24 (×11): 1 via ORAL
  Filled 2018-11-17 (×17): qty 1

## 2018-11-17 MED ORDER — SODIUM CHLORIDE 0.9% FLUSH: 3.0000 mL | INTRAVENOUS | Status: DC | PRN

## 2018-11-17 MED ORDER — RIVAROXABAN 20 MG PO TABS
20.0000 mg | ORAL_TABLET | Freq: Every day | ORAL | Status: DC
  Administered 2018-11-17 – 2018-11-18 (×2): 20 mg via ORAL
  Filled 2018-11-17 (×3): qty 1

## 2018-11-17 MED ORDER — ONDANSETRON HCL 4 MG/2ML IJ SOLN: 4.0000 mg | Freq: Four times a day (QID) | INTRAMUSCULAR | Status: DC | PRN

## 2018-11-17 MED ORDER — SPIRONOLACTONE 12.5 MG HALF TABLET
12.5000 mg | ORAL_TABLET | Freq: Every day | ORAL | Status: DC
  Administered 2018-11-17 – 2018-11-25 (×9): 12.5 mg via ORAL
  Filled 2018-11-17 (×12): qty 1

## 2018-11-17 MED ORDER — ACETAMINOPHEN 325 MG PO TABS
650.0000 mg | ORAL_TABLET | ORAL | Status: DC | PRN
  Administered 2018-11-19 – 2018-11-24 (×3): 650 mg via ORAL
  Filled 2018-11-17 (×4): qty 2

## 2018-11-17 NOTE — ED Provider Notes (Signed)
Medical screening examination/treatment/procedure(s) were conducted as a shared visit with non-physician practitioner(s) and myself.  I personally evaluated the patient during the encounter.  Long history of heart disease recently had a stent placed a couple months ago last echo in June with a EF of 20% here with progressively worsening shortness of breath specifically worse today especially with exertion.  No specific chest pain.  Has generalized malaise as well. Patient has crackles bilaterally mild lower extremity edema mild JVD no S3.  Vitals are within normal limits besides being slightly tachypneic and slightly low on his oxygen. Likely fluid overload, will give Lasix and reassess for improvement in disposition.        Ramaya Guile, Corene Cornea, MD 11/17/18 [phone]

## 2018-11-17 NOTE — ED Provider Notes (Signed)
[facility] EMERGENCY DEPARTMENT Provider Note   CSN: [id number] Arrival date & time: 11/16/18  2123    History   Chief Complaint Chief Complaint  Patient presents with  . Shortness of Breath    HPI Ricardo Hunt is a 78 y.o. male with a hx of hypertension, hyperlipidemia, coronary artery disease, thrombocytopenia, complete heart block status post pacemaker insertion in 2014, CABG, chronic systolic congestive heart failure presents to the Emergency Department complaining of gradual, persistent, progressively worsening shortness of breath onset several weeks ago with acute worsening in the last 24-48 hours.  Patient reports he has noticed an increase in his peripheral edema over the last several days and by this afternoon he can only take several steps without severe shortness of breath.  Patient also endorses worsening orthopnea.  Patient reports compliance with his medications including Lasix, spironolactone, Plavix and Xarelto.  Patient denies recent illness or known sick contacts.  He denies fever, chills, headache, neck pain, chest pain, abdominal pain, nausea, vomiting, diarrhea, weakness, dizziness, syncope.  Patient is a former smoker but quit in 1996.  He does report a history of CABG in 1996 and coronary stent placement in February 2020.  Patient is followed by Dr. Thompson Grayer and Dr. [name] of cardiology.  Patient does not wear oxygen at home.     The history is provided by the patient and medical records. No language interpreter was used.    Past Medical History:  Diagnosis Date  . Arthritis    " all over the body"  . Atrial fibrillation and flutter ([place])    seen on PPM interrogation; Rivaroxaban started 04/2018  . Chronic systolic CHF (congestive heart failure) ([place])    Echo 05/2018: EF 20-25, severe LVE, diffuse HK with septal and apical akinesis, moderate LAE, moderate MAC, severe aortic valve calcification - degree of AS diff to judge with low EF  (likely mild to mod)  . Coronary artery disease    s/p CABG 1996 // LHC 05/2018: oLAD 90, pLAD 100 after Dx, LCx 90 at bifurcation of OM1/OM2, mRCA 50; L-LAD ok; S-D1/OM2 100 >> PCI: DES to mLCx and orbital atherectomy and DES to oLAD  . GERD (gastroesophageal reflux disease)   . Hyperlipidemia   . Hypertension   . Mobitz (type) II atrioventricular block    s/p STJ Accent pacemaker implanted by Dr [name] 10-16-2012  . Myocardial infarction [facility])    per pt., told that that the stress test shows that there is a weakening evident on the stress test  . Pacemaker 10/16/2012   St. Jude   . Personal history of colonic polyps-tubular adenomas 08/31/2013  . Vertigo     Patient Active Problem List   Diagnosis Date Noted  . Atrial fibrillation and flutter ([place])   . CKD (chronic kidney disease), stage III ([place])   . AKI (acute kidney injury) ([place]) 05/25/2018  . Chronic systolic CHF (congestive heart failure) ([place]) 05/20/2018  . Dyspnea on exertion 05/20/2018  . Acute on chronic systolic CHF (congestive heart failure) ([place]) 05/20/2018  . S/P lumbar laminectomy 07/19/2016  . Torticollis, acute 01/21/2015  . Personal history of colonic polyps-tubular adenomas 08/31/2013  . Complete heart block [facility]) s/p Pacemaker in 2014 10/15/2012  . S/P CABG (coronary artery bypass graft) 10/06/2012  . Rib fracture 10/05/2012  . Syncope 10/03/2012  . Coronary artery disease involving native coronary artery of native heart with angina pectoris ([place]) 10/03/2012  . Hypertension 10/03/2012  . Hyperlipidemia 10/03/2012  .  Thrombocytopenia ([place]) 10/03/2012  . Dehydration 10/03/2012    Past Surgical History:  Procedure Laterality Date  . cardiac bypass  1996   3 vessels  . CATARACT EXTRACTION W/ INTRAOCULAR LENS  IMPLANT, BILATERAL Bilateral 2010  . COLONOSCOPY    . CORONARY ARTERY BYPASS GRAFT    . CORONARY ATHERECTOMY N/A 05/23/2018   Procedure: CORONARY ATHERECTOMY;  Surgeon: [name], Peter M, MD;  Location: [place] CV LAB;  Service: Cardiovascular;  Laterality: N/A;  . CORONARY STENT INTERVENTION N/A 05/23/2018   Procedure: CORONARY STENT INTERVENTION;  Surgeon: [name], Peter M, MD;  Location: [place] CV LAB;  Service: Cardiovascular;  Laterality: N/A;  . INGUINAL HERNIA REPAIR Left 2010  . LUMBAR LAMINECTOMY/DECOMPRESSION MICRODISCECTOMY Right 07/19/2016   Procedure: Right Lumbar Four-Five Hemilaminectomy and Bilateral Lumbar Two-Three Laminectomy;  Surgeon: Eustace Moore, MD;  Location: [facility];  Service: Neurosurgery;  Laterality: Right;  Right Lumbar Four-Five Hemilaminectomy and Bilateral Lumbar Two-Three Laminectomy  . LUMBAR SPINE SURGERY  2006  . NASAL SEPTUM SURGERY  09/01/13   Dr. Adriana Reams  . PACEMAKER INSERTION  10-16-2012   STJ Accent dual chamber pacemaker implanted by Dr [name] for symptomatic Mobitz II heart block  . PERMANENT PACEMAKER INSERTION N/A 10/16/2012   Procedure: PERMANENT PACEMAKER INSERTION;  Surgeon: Thompson Grayer, MD;  Location: [facility] CATH LAB;  Service: Cardiovascular;  Laterality: N/A;  . RIGHT/LEFT HEART CATH AND CORONARY/GRAFT ANGIOGRAPHY N/A 05/20/2018   Procedure: RIGHT/LEFT HEART CATH AND CORONARY/GRAFT ANGIOGRAPHY;  Surgeon: [name], Peter M, MD;  Location: [place] [facility] LAB;  Service: Cardiovascular;  Laterality: N/A;        Home Medications    Prior to Admission medications   Medication Sig Start Date End Date Taking? Authorizing Provider  meloxicam (MOBIC) 7.5 MG tablet TAKE 1 TABLET BY MOUTH  DAILY 06/23/18  Yes [provider]  predniSONE (DELTASONE) 5 MG tablet TAKE 1 TABLET BY MOUTH  DAILY 08/14/18  Yes [provider]  acetaminophen (TYLENOL) 500 MG tablet Take 1,000 mg by mouth 2 (two) times daily as needed (for leg pain/pain).     [provider]  carvedilol (COREG) 12.5 MG tablet Take 1 tablet (12.5 mg total) by mouth 2 (two) times daily with a meal. 05/25/18 05/25/19  Richardson Dopp T, PA-C  clopidogrel (PLAVIX) 75 MG  tablet Take 1 tablet (75 mg total) by mouth daily. 05/26/18 05/26/19  Richardson Dopp T, PA-C  Doxylamine Succinate, Sleep, (SLEEP AID PO) Take 1 tablet by mouth at bedtime as needed (for sleep).    [provider]  furosemide (LASIX) 40 MG tablet Take 1 tablet (40 mg total) by mouth 2 (two) times daily. 11/04/18 02/02/19  Herminio Commons, MD  Omega-3 Fatty Acids (FISH OIL) 1000 MG CAPS Take 2,000 mg by mouth daily.     [provider]  potassium chloride SA (K-DUR,KLOR-CON) 20 MEQ tablet Take 1 tablet (20 mEq total) by mouth daily. 02/07/18 11/16/18  Herminio Commons, MD  rivaroxaban (XARELTO) 20 MG TABS tablet Take 1 tablet (20 mg total) by mouth daily with supper. 05/02/18   Allred, Jeneen Rinks, MD  rosuvastatin (CRESTOR) 20 MG tablet Take 1 tablet (20 mg total) by mouth daily at 6 PM. 11/13/18 11/13/19  Allred, Jeneen Rinks, MD  sacubitril-valsartan (ENTRESTO) 24-26 MG Take 1 tablet by mouth 2 (two) times daily. 05/25/18 05/25/19  Richardson Dopp T, PA-C  spironolactone (ALDACTONE) 25 MG tablet Take 0.5 tablets (12.5 mg total) by mouth daily. 05/26/18 05/26/19  Richardson Dopp  T, PA-C    Family History Family History  Problem Relation Age of Onset  . CAD Father   . Hypertension Father   . CAD Mother   . Diabetes Mother   . Breast cancer Other        Niece  . Colon cancer Neg Hx   . Pancreatic cancer Neg Hx   . Stomach cancer Neg Hx     Social History Social History   Tobacco Use  . Smoking status: Former Smoker    Packs/day: 2.00    Years: 40.00    Pack years: 80.00    Types: Cigarettes    Quit date: 04/02/1994    Years since quitting: 24.6  . Smokeless tobacco: Never Used  Substance Use Topics  . Alcohol use: Not Currently    Alcohol/week: 0.0 standard drinks  . Drug use: No     Allergies   Patient has no known allergies.   Review of Systems Review of Systems  Constitutional: Negative for appetite change, diaphoresis, fatigue, fever and unexpected weight change.  HENT:  Negative for mouth sores.   Eyes: Negative for visual disturbance.  Respiratory: Positive for shortness of breath. Negative for cough, chest tightness and wheezing.        Dyspnea on exertion, orthopnea  Cardiovascular: Positive for leg swelling. Negative for chest pain.  Gastrointestinal: Negative for abdominal pain, constipation, diarrhea, nausea and vomiting.  Endocrine: Negative for polydipsia, polyphagia and polyuria.  Genitourinary: Negative for dysuria, frequency, hematuria and urgency.  Musculoskeletal: Negative for back pain and neck stiffness.  Skin: Negative for rash.  Allergic/Immunologic: Negative for immunocompromised state.  Neurological: Negative for syncope, light-headedness and headaches.  Hematological: Does not bruise/bleed easily.  Psychiatric/Behavioral: Negative for sleep disturbance. The patient is not nervous/anxious.      Physical Exam Updated Vital Signs BP 108/76   Pulse (!) 54   Temp 98.8 F (37.1 C) (Oral)   Resp (!) 26   Ht _0  (1.727 m)   Wt 81.6 kg   SpO2 95%   BMI 27.37 kg/m   Physical Exam Vitals signs and nursing note reviewed.  Constitutional:      General: He is not in acute distress.    Appearance: He is not diaphoretic.  HENT:     Head: Normocephalic.  Eyes:     General: No scleral icterus.    Conjunctiva/sclera: Conjunctivae normal.  Neck:     Musculoskeletal: Normal range of motion.  Cardiovascular:     Rate and Rhythm: Normal rate and regular rhythm.     Pulses: Normal pulses.          Radial pulses are 2+ on the right side and 2+ on the left side.  Pulmonary:     Effort: Tachypnea and accessory muscle usage present. No prolonged expiration, respiratory distress or retractions.     Breath sounds: No stridor. Examination of the right-lower field reveals rales. Examination of the left-lower field reveals rales. Decreased breath sounds and rales present.     Comments: Equal chest rise. Increased work of breathing. SPO2  94-92% at rest. Abdominal:     General: There is no distension.     Palpations: Abdomen is soft.     Tenderness: There is no abdominal tenderness. There is no guarding or rebound.  Musculoskeletal:     Right lower leg: Edema present.     Left lower leg: Edema present.     Comments: Moves all extremities equally and without difficulty.  Skin:  General: Skin is warm and dry.     Capillary Refill: Capillary refill takes less than 2 seconds.  Neurological:     Mental Status: He is alert.     GCS: GCS eye subscore is 4. GCS verbal subscore is 5. GCS motor subscore is 6.     Comments: Speech is clear and goal oriented.  Psychiatric:        Mood and Affect: Mood normal.      ED Treatments / Results  Labs (all labs ordered are listed, but only abnormal results are displayed) Labs Reviewed  BASIC METABOLIC PANEL - Abnormal; Notable for the following components:      Result Value   Glucose, Bld 156 (*)    BUN 27 (*)    Creatinine, Ser 1.37 (*)    GFR calc non Af Amer 48 (*)    GFR calc Af Amer 56 (*)    All other components within normal limits  BRAIN NATRIURETIC PEPTIDE - Abnormal; Notable for the following components:   B Natriuretic Peptide 783.8 (*)    All other components within normal limits  TROPONIN I (HIGH SENSITIVITY) - Abnormal; Notable for the following components:   Troponin I (High Sensitivity) 24 (*)    All other components within normal limits  TROPONIN I (HIGH SENSITIVITY) - Abnormal; Notable for the following components:   Troponin I (High Sensitivity) 27 (*)    All other components within normal limits  SARS CORONAVIRUS 2  CBC    EKG EKG Interpretation  Date/Time:  Sunday November 16 2018 21:32:40 EDT Ventricular Rate:  79 PR Interval:  204 QRS Duration: 194 QT Interval:  452 QTC Calculation: 518 R Axis:   -80 Text Interpretation:  Sinus rhythm with occasional Premature ventricular complexes and Fusion complexes Left bundle branch block Abnormal ECG  No significant change since last tracing Confirmed by Merrily Pew [phone]) on 11/17/2018 1:42:14 AM   Radiology Dg Chest 2 View  Result Date: 11/16/2018 CLINICAL DATA:  Shortness of breath. EXAM: CHEST - 2 VIEW COMPARISON:  October 16, 2016. FINDINGS: The heart size is enlarged. There is volume overload with mild developing pulmonary edema. There is a dual chamber left-sided pacemaker. The patient is status post prior median sternotomy. Aortic calcifications are noted. There are degenerative changes throughout the thoracic spine. There is no pneumothorax. IMPRESSION: Cardiomegaly with vascular congestion and possible early developing pulmonary edema. Electronically Signed   By: Constance Holster M.D.   On: 11/16/2018 21:56    Procedures Procedures (including critical care time)  Medications Ordered in ED Medications  furosemide (LASIX) injection 40 mg (40 mg Intravenous Given 11/17/18 0301)  sodium chloride flush (NS) 0.9 % injection 3 mL (3 mLs Intravenous Given 11/16/18 2245)  furosemide (LASIX) injection 40 mg (40 mg Intravenous Given 11/17/18 0018)     Initial Impression / Assessment and Plan / ED Course  I have reviewed the triage vital signs and the nursing notes.  Pertinent labs & imaging results that were available during my care of the patient were reviewed by me and considered in my medical decision making (see chart for details).  Clinical Course as of Nov 17 307  Nancy Fetter Nov 16, 2018  2230 Echo 09/2018: 1. EF 20% 2. Elevated left ventricular end-diastolic pressure Left ventricular diffuse hypokinesis. 3. The right ventricle has moderately reduced systolic function. 4. Moderate aortic stenosis   [HM]  2231 baseline  Creatinine(!): 1.37 [HM]  2231 elevated  Troponin I (High Sensitivity)(!): 24 [HM]  2231 WNL  WBC: 10.4 [HM]  2231 No fever  Temp: 98.8 F (37.1 C) [HM]  Mon Nov 17, 2018  0146 PCP: [place]   [HM]  [phone] Patient continues to have tachypnea at rest even  after Lasix.  While walking, patient becomes tachycardic and oxygen saturations decreased to 90%.   [HM]  0308 Discussed with Dr. Myna Hidalgo who will admit   [HM]    Clinical Course User Index [HM] Tirth Cothron, Gwenlyn Perking       Patient presents with dyspnea on exertion, orthopnea and history of CHF.  No chest pain today.  EKG with normal sinus rhythm and left bundle branch block.  Patient is tachypneic with some sensory muscle usage.  Significant shortness of breath with ambulation and oxygen saturations dipping to 90%.  No fevers or chills, no COVID contacts.  Chest x-ray does show evidence of pulmonary edema but no evidence of pneumonia.  Mild elevation of troponin here in the emergency department but suspect this is likely secondary to congestive heart failure.  Given Lasix here in the emergency department with minimal output and no improvement in his shortness of breath.  Will need admission.   The patient was discussed with and seen by Dr. Dayna Barker who agrees with the treatment plan.   Final Clinical Impressions(s) / ED Diagnoses   Final diagnoses:  Acute on chronic congestive heart failure, unspecified heart failure type [facility])    ED Discharge Orders    None       Agapito Games 11/17/18 0309    Mesner, Corene Cornea, MD 11/17/18 [phone]

## 2018-11-17 NOTE — H&P (Signed)
History and Physical    Amaru JD MCCASTER QIH:[id number] DOB: [birth year] DOA: 11/16/2018  PCP: Dione Housekeeper, MD   Patient coming from: Home   Chief Complaint: SOB   HPI: Ricardo Hunt is a 78 y.o. male with medical history significant for coronary artery disease status post remote CABG and then 2 stents in February 2020, atrial fibrillation on Xarelto, complete heart block with pacer, and ischemic cardiomyopathy with EF 20% in June, now presenting to the emergency department for insidiously worsening dyspnea.  Patient reports that he has had some shortness of breath for the past 3 months or so, but has worsened significantly over the past couple days.  He reports some bilateral ankle swelling, but does not feel that this has changed much recently.  He denied chest pain, fevers, chills, or cough.  Patient believes his weight has been fairly stable.  He reports eating a lot of salty foods, but states that he always has and there has not been any increase in salt or fluid intake.  States that there has not been any medication changes since February and he has continued to be adherent to his regimen.  ED Course: Upon arrival to the ED, patient is found to be afebrile, saturating mid 90s on room air, slightly tachypneic, and with stable blood pressure.  EKG features a sinus rhythm with PVCs, fusion complexes, and LBBB.  Chest x-ray is notable for cardiomegaly and vascular congestion with concern for early pulmonary edema.  Chemistry panel is notable for a creatinine 1.37, similar to priors.  CBC is unremarkable.  Troponin is mildly elevated and BNP is elevated to 784 which is higher than priors.  Patient was given 40 mg IV Lasix in the emergency department a couple hours ago, but has not urinated much and remains dyspneic at rest, only able to ambulate a few steps without beginning to develop distress, and he will be observed for ongoing evaluation and management.  Review of Systems:  All other systems  reviewed and apart from HPI, are negative.  Past Medical History:  Diagnosis Date   Arthritis    " all over the body"   Atrial fibrillation and flutter ([place])    seen on PPM interrogation; Rivaroxaban started 11/7562   Chronic systolic CHF (congestive heart failure) ([place])    Echo 05/2018: EF 20-25, severe LVE, diffuse HK with septal and apical akinesis, moderate LAE, moderate MAC, severe aortic valve calcification - degree of AS diff to judge with low EF (likely mild to mod)   Coronary artery disease    s/p CABG 1996 // [place] 05/2018: oLAD 90, pLAD 100 after Dx, LCx 90 at bifurcation of OM1/OM2, mRCA 50; L-LAD ok; S-D1/OM2 100 >> PCI: DES to mLCx and orbital atherectomy and DES to oLAD   GERD (gastroesophageal reflux disease)    Hyperlipidemia    Hypertension    Mobitz (type) II atrioventricular block    s/p STJ Accent pacemaker implanted by Dr [name] 10-16-2012   Myocardial infarction [facility])    per pt., told that that the stress test shows that there is a weakening evident on the stress test   Pacemaker 10/16/2012   St. Jude    Personal history of colonic polyps-tubular adenomas 08/31/2013   Vertigo     Past Surgical History:  Procedure Laterality Date   cardiac bypass  1996   3 vessels   CATARACT EXTRACTION W/ INTRAOCULAR LENS  IMPLANT, BILATERAL Bilateral 2010   COLONOSCOPY     CORONARY ARTERY BYPASS  GRAFT     CORONARY ATHERECTOMY N/A 05/23/2018   Procedure: CORONARY ATHERECTOMY;  Surgeon: [name], Peter M, MD;  Location: [place];  Service: Cardiovascular;  Laterality: N/A;   CORONARY STENT INTERVENTION N/A 05/23/2018   Procedure: CORONARY STENT INTERVENTION;  Surgeon: [name], Peter M, MD;  Location: [place] [facility];  Service: Cardiovascular;  Laterality: N/A;   INGUINAL HERNIA REPAIR Left 2010   LUMBAR LAMINECTOMY/DECOMPRESSION MICRODISCECTOMY Right 07/19/2016   Procedure: Right Lumbar Four-Five Hemilaminectomy and Bilateral Lumbar Two-Three Laminectomy;   Surgeon: Eustace Moore, MD;  Location: [facility];  Service: Neurosurgery;  Laterality: Right;  Right Lumbar Four-Five Hemilaminectomy and Bilateral Lumbar Two-Three Laminectomy   LUMBAR SPINE SURGERY  2006   NASAL SEPTUM SURGERY  09/01/13   Dr. Adriana Reams   PACEMAKER INSERTION  10-16-2012   STJ Accent dual chamber pacemaker implanted by Dr [name] for symptomatic Mobitz II heart block   PERMANENT PACEMAKER INSERTION N/A 10/16/2012   Procedure: PERMANENT PACEMAKER INSERTION;  Surgeon: Thompson Grayer, MD;  Location: [facility] C[facility];  Service: Cardiovascular;  Laterality: N/A;   RIGHT/LEFT HEART CATH AND CORONARY/GRAFT ANGIOGRAPHY N/A 05/20/2018   Procedure: RIGHT/LEFT HEART CATH AND CORONARY/GRAFT ANGIOGRAPHY;  Surgeon: [name], Peter M, MD;  Location: [place];  Service: Cardiovascular;  Laterality: N/A;     reports that he quit smoking about 24 years ago. His smoking use included cigarettes. He has a 80.00 pack-year smoking history. He has never used smokeless tobacco. He reports previous alcohol use. He reports that he does not use drugs.  No Known Allergies  Family History  Problem Relation Age of Onset   CAD Father    Hypertension Father    CAD Mother    Diabetes Mother    Breast cancer Other        Niece   Colon cancer Neg Hx    Pancreatic cancer Neg Hx    Stomach cancer Neg Hx      Prior to Admission medications   Medication Sig Start Date End Date Taking? Authorizing Provider  meloxicam (MOBIC) 7.5 MG tablet TAKE 1 TABLET BY MOUTH  DAILY 06/23/18  Yes [provider]  predniSONE (DELTASONE) 5 MG tablet TAKE 1 TABLET BY MOUTH  DAILY 08/14/18  Yes [provider]  acetaminophen (TYLENOL) 500 MG tablet Take 1,000 mg by mouth 2 (two) times daily as needed (for leg pain/pain).     [provider]  carvedilol (COREG) 12.5 MG tablet Take 1 tablet (12.5 mg total) by mouth 2 (two) times daily with a meal. 05/25/18 05/25/19  Richardson Dopp T, PA-C   clopidogrel (PLAVIX) 75 MG tablet Take 1 tablet (75 mg total) by mouth daily. 05/26/18 05/26/19  Richardson Dopp T, PA-C  Doxylamine Succinate, Sleep, (SLEEP AID PO) Take 1 tablet by mouth at bedtime as needed (for sleep).    [provider]  furosemide (LASIX) 40 MG tablet Take 1 tablet (40 mg total) by mouth 2 (two) times daily. 11/04/18 02/02/19  Herminio Commons, MD  Omega-3 Fatty Acids (FISH OIL) 1000 MG CAPS Take 2,000 mg by mouth daily.     [provider]  potassium chloride SA (K-DUR,KLOR-CON) 20 MEQ tablet Take 1 tablet (20 mEq total) by mouth daily. 02/07/18 11/16/18  Herminio Commons, MD  rivaroxaban (XARELTO) 20 MG TABS tablet Take 1 tablet (20 mg total) by mouth daily with supper. 05/02/18   Allred, Jeneen Rinks, MD  rosuvastatin (CRESTOR) 20 MG tablet Take 1 tablet (20 mg  total) by mouth daily at 6 PM. 11/13/18 11/13/19  Allred, Jeneen Rinks, MD  sacubitril-valsartan (ENTRESTO) 24-26 MG Take 1 tablet by mouth 2 (two) times daily. 05/25/18 05/25/19  Richardson Dopp T, PA-C  spironolactone (ALDACTONE) 25 MG tablet Take 0.5 tablets (12.5 mg total) by mouth daily. 05/26/18 05/26/19  Richardson Dopp T, PA-C    Physical Exam: Vitals:   11/17/18 0000 11/17/18 0015 11/17/18 0030 11/17/18 0100  BP: 109/85 108/77 (!) 113/101 108/76  Pulse:  (!) 42 (!) 141 (!) 54  Resp: (!) 23 (!) 21 (!) 26 (!) 26  Temp:      TempSrc:      SpO2:  94% 95% 95%  Weight:      Height:        Constitutional: NAD, calm  Eyes: PERTLA, lids and conjunctivae normal ENMT: Mucous membranes are moist. Posterior pharynx clear of any exudate or lesions.   Neck: normal, supple, no masses, no thyromegaly Respiratory: Rales bilaterally. No pallor. No accessory muscle use.  Cardiovascular: S1 & S2 heard, regular rate and rhythm. Trace ankle edema bilaterally. Abdomen: No distension, no tenderness, soft. Bowel sounds normal.  Musculoskeletal: no clubbing / cyanosis. No joint deformity upper and lower extremities.   Skin:  no significant rashes, lesions, ulcers. Warm, dry, well-perfused. Neurologic: CN 2-12 grossly intact. Sensation intact. Strength 5/5 in all 4 limbs.  Psychiatric: Alert and oriented x 3. Very pleasant, cooperative.    Labs on Admission: I have personally reviewed following labs and imaging studies  CBC: Recent Labs  Lab 11/16/18 2135  WBC 10.4  HGB 15.9  HCT 48.2  MCV 98.0  PLT 694   Basic Metabolic Panel: Recent Labs  Lab 11/16/18 2135  NA 138  K 4.6  CL 103  CO2 24  GLUCOSE 156*  BUN 27*  CREATININE 1.37*  CALCIUM 9.6   GFR: Estimated Creatinine Clearance: 41.6 mL/min (A) (by C-G formula based on SCr of 1.37 mg/dL (H)). Liver Function Tests: No results for input(s): AST, ALT, ALKPHOS, BILITOT, PROT, ALBUMIN in the last 168 hours. No results for input(s): LIPASE, AMYLASE in the last 168 hours. No results for input(s): AMMONIA in the last 168 hours. Coagulation Profile: No results for input(s): INR, PROTIME in the last 168 hours. Cardiac Enzymes: No results for input(s): CKTOTAL, CKMB, CKMBINDEX, TROPONINI in the last 168 hours. BNP (last 3 results) No results for input(s): PROBNP in the last 8760 hours. HbA1C: No results for input(s): HGBA1C in the last 72 hours. CBG: No results for input(s): GLUCAP in the last 168 hours. Lipid Profile: No results for input(s): CHOL, HDL, LDLCALC, TRIG, CHOLHDL, LDLDIRECT in the last 72 hours. Thyroid Function Tests: No results for input(s): TSH, T4TOTAL, FREET4, T3FREE, THYROIDAB in the last 72 hours. Anemia Panel: No results for input(s): VITAMINB12, FOLATE, FERRITIN, TIBC, IRON, RETICCTPCT in the last 72 hours. Urine analysis: No results found for: COLORURINE, APPEARANCEUR, LABSPEC, PHURINE, GLUCOSEU, HGBUR, BILIRUBINUR, KETONESUR, PROTEINUR, UROBILINOGEN, NITRITE, LEUKOCYTESUR Sepsis Labs: _0 (procalcitonin:4,lacticidven:4) )No results found for this or any previous visit (from the past 240 hour(s)).    Radiological Exams on Admission: Dg Chest 2 View  Result Date: 11/16/2018 CLINICAL DATA:  Shortness of breath. EXAM: CHEST - 2 VIEW COMPARISON:  October 16, 2016. FINDINGS: The heart size is enlarged. There is volume overload with mild developing pulmonary edema. There is a dual chamber left-sided pacemaker. The patient is status post prior median sternotomy. Aortic calcifications are noted. There are degenerative changes throughout the thoracic spine. There is no  pneumothorax. IMPRESSION: Cardiomegaly with vascular congestion and possible early developing pulmonary edema. Electronically Signed   By: Constance Holster M.D.   On: 11/16/2018 21:56    EKG: Independently reviewed. Sinus rhythm, PVC's, fusion beats, LBBB.   Assessment/Plan   1. Acute on chronic systolic CHF  - Presents with SOB, found to have mild peripheral edema, BNP 784 and higher than priors, and CXR consistent with CHF  - Echo from June 2020 with EF 20%, diffuse HK, moderate LAE, mild-mod TR, and at least moderate AS  - His electrophysiologist is planning for CRTP upgrade at some point  - Diurese with Lasix 40 mg IV q12h, follow daily wt and I/O's, continue Coreg and Entresto, monitor renal function and electrolytes during diuresis   2. CAD  - No anginal complaints  - Continue Plavix, statin, and beta-blocker    3. Atrial fibrillation  - In sinus rhythm on admission  - CHADS-VASc is 44 (age x2, CHF, CAD) - Continue Xarelto and beta-blocker    4. Complete heart block  - Pacemaker placed in 2014, denies lightheadedness, palpitations, or syncope     PPE: Mask, face shield  DVT prophylaxis: Xarelto  Code Status: Full  Family Communication: Discussed with patient  Consults called: None  Admission status: Observation     Vianne Bulls, MD [facility] Pager [phone]  If 7PM-7AM, please contact night-coverage [email or web address] Password [email or web address]  11/17/2018, 2:19 AM

## 2018-11-17 NOTE — ED Notes (Signed)
Lunch tray ordered for this patient.

## 2018-11-17 NOTE — ED Notes (Signed)
Lunch tray ordered 

## 2018-11-17 NOTE — ED Notes (Signed)
O2 sats remained between 90% - 93% on ambulation. As we ambulated O2 sats steadily dropped, patient dyspneic afterwards.

## 2018-11-17 NOTE — Progress Notes (Signed)
PROGRESS NOTE    Ricardo Hunt  WCH:[id number] DOB: 1940/10/16 DOA: 11/16/2018 PCP: Dione Housekeeper, MD    Brief Narrative:   Ricardo Hunt is a 78 y.o. male with medical history significant for coronary artery disease status post remote CABG and then 2 stents in February 2020, atrial fibrillation on Xarelto, complete heart block with pacer, and ischemic cardiomyopathy with EF 20% in June, now presenting to the emergency department for insidiously worsening dyspnea.  Patient reports that he has had some shortness of breath for the past 3 months or so, but has worsened significantly over the past couple days.  He reports some bilateral ankle swelling, but does not feel that this has changed much recently.  He denied chest pain, fevers, chills, or cough.  Patient believes his weight has been fairly stable.  He reports eating a lot of salty foods, but states that he always has and there has not been any increase in salt or fluid intake.  States that there has not been any medication changes since February and he has continued to be adherent to his regimen.  ED Course: Upon arrival to the ED, patient is found to be afebrile, saturating mid 90s on room air, slightly tachypneic, and with stable blood pressure.  EKG features a sinus rhythm with PVCs, fusion complexes, and LBBB.  Chest x-ray is notable for cardiomegaly and vascular congestion with concern for early pulmonary edema.  Chemistry panel is notable for a creatinine 1.37, similar to priors.  CBC is unremarkable.  Troponin is mildly elevated and BNP is elevated to 784 which is higher than priors.  Patient was given 40 mg IV Lasix in the emergency department a couple hours ago, but has not urinated much and remains dyspneic at rest, only able to ambulate a few steps without beginning to develop distress, and he will be observed for ongoing evaluation and management.    Assessment & Plan:   Principal Problem:   Acute on chronic systolic CHF  (congestive heart failure) (HCC) Active Problems:   Coronary artery disease involving native coronary artery of native heart with angina pectoris (HCC)   Complete heart block ([place]) s/p Pacemaker in 2014   Atrial fibrillation and flutter (H[facility])   CKD (chronic kidney disease), stage III (H[facility])   Acute on chronic systolic congestive heart failure Patient presenting from home with progressive shortness of breath and lower extremity edema.  BNP elevated to 784 with chest x-ray notable for cardiomegaly with vascular congestion and pulmonary edema.  Previous echo from June 2020 with EF 20%, diffuse HK, moderate LAE, mild-mod TR, and at least moderate AS.  --Continue home Coreg, Spironolactone and Entresto --Continue furosemide 40 mg IV BID --Daily weights and strict I's and O's --Monitor renal function daily  CAD  No anginal complaints  --Continue Plavix, statin, and beta-blocker    Paroxysmal atrial fibrillation  Currently in normal sinus rhythm on telemetry. CHADS-VASc is 89 (age x2, CHF, CAD) --Continue Xarelto and beta-blocker    Complete heart block  Pacemaker placed in 2014, denies lightheadedness, palpitations, or syncope . His electrophysiologist is planning for CRTP upgrade at some point  --Continue to monitor on telemetry  DVT prophylaxis: Xarelto Code Status: Full code Family Communication: None Disposition Plan: Continue inpatient hospitalization, IV diuresis, PT/OT consult pending, further depending on clinical course   Consultants:   none  Procedures:   None  Antimicrobials:   None   Subjective: Patient seen and examined at bedside, resting comfortably.  Reports good  urine output with IV furosemide.  Continues with shortness of breath.  No other specific complaints or concerns at this time.  Denies headache, no fever/chills/night sweats, no nausea/vomiting/diarrhea, no chest pain, no palpitations, abdominal pain, no weakness, paresthesias.  No acute events  overnight per nursing staff.  Objective: Vitals:   11/17/18 1045 11/17/18 1200 11/17/18 1300 11/17/18 1417  BP: 98/68 114/74 (!) 120/94 (!) 121/94  Pulse: (!) 58 69 84 84  Resp: (!) 23 (!) 21 20   Temp:      TempSrc:      SpO2: 93% 99% 94% 96%  Weight:      Height:        Intake/Output Summary (Last 24 hours) at 11/17/2018 1655 Last data filed at 11/17/2018 1321 Gross per 24 hour  Intake 222 ml  Output 1400 ml  Net -1178 ml   Filed Weights   11/16/18 2139  Weight: 81.6 kg    Examination:  General exam: Appears calm and comfortable  Respiratory system: Breath sounds decreased bilateral bases with rales/crackles, no wheezes, normal respiratory effort, currently on room air Cardiovascular system: S1 & S2 heard, RRR. No JVD, murmurs, rubs, gallops or clicks.  Trace to 1+ pedal edema noted bilateral lower extremities to level of ankle Gastrointestinal system: Abdomen is nondistended, soft and nontender. No organomegaly or masses felt. Normal bowel sounds heard. Central nervous system: Alert and oriented. No focal neurological deficits. Extremities: Symmetric 5 x 5 power. Skin: No rashes, lesions or ulcers Psychiatry: Judgement and insight appear normal. Mood & affect appropriate.     Data Reviewed: I have personally reviewed following labs and imaging studies  CBC: Recent Labs  Lab 11/16/18 2135  WBC 10.4  HGB 15.9  HCT 48.2  MCV 98.0  PLT 786   Basic Metabolic Panel: Recent Labs  Lab 11/16/18 2135  NA 138  K 4.6  CL 103  CO2 24  GLUCOSE 156*  BUN 27*  CREATININE 1.37*  CALCIUM 9.6   GFR: Estimated Creatinine Clearance: 41.6 mL/min (A) (by C-G formula based on SCr of 1.37 mg/dL (H)). Liver Function Tests: No results for input(s): AST, ALT, ALKPHOS, BILITOT, PROT, ALBUMIN in the last 168 hours. No results for input(s): LIPASE, AMYLASE in the last 168 hours. No results for input(s): AMMONIA in the last 168 hours. Coagulation Profile: No results for  input(s): INR, PROTIME in the last 168 hours. Cardiac Enzymes: No results for input(s): CKTOTAL, CKMB, CKMBINDEX, TROPONINI in the last 168 hours. BNP (last 3 results) No results for input(s): PROBNP in the last 8760 hours. HbA1C: No results for input(s): HGBA1C in the last 72 hours. CBG: No results for input(s): GLUCAP in the last 168 hours. Lipid Profile: No results for input(s): CHOL, HDL, LDLCALC, TRIG, CHOLHDL, LDLDIRECT in the last 72 hours. Thyroid Function Tests: No results for input(s): TSH, T4TOTAL, FREET4, T3FREE, THYROIDAB in the last 72 hours. Anemia Panel: No results for input(s): VITAMINB12, FOLATE, FERRITIN, TIBC, IRON, RETICCTPCT in the last 72 hours. Sepsis Labs: No results for input(s): PROCALCITON, LATICACIDVEN in the last 168 hours.  Recent Results (from the past 240 hour(s))  SARS CORONAVIRUS 2 Nasal Swab Aptima Multi Swab     Status: None   Collection Time: 11/17/18  2:14 AM   Specimen: Aptima Multi Swab; Nasal Swab  Result Value Ref Range Status   SARS Coronavirus 2 NEGATIVE NEGATIVE Final    Comment: (NOTE) SARS-CoV-2 target nucleic acids are NOT DETECTED. The SARS-CoV-2 RNA is generally detectable in upper and  lower respiratory specimens during the acute phase of infection. Negative results do not preclude SARS-CoV-2 infection, do not rule out co-infections with other pathogens, and should not be used as the sole basis for treatment or other patient management decisions. Negative results must be combined with clinical observations, patient history, and epidemiological information. The expected result is Negative. Fact Sheet for Patients: SugarRoll.be Fact Sheet for Healthcare Providers: https://www.woods-mathews.com/ This test is not yet approved or cleared by the [place] FDA and  has been authorized for detection and/or diagnosis of SARS-CoV-2 by FDA under an Emergency Use Authorization (EUA). This EUA  will remain  in effect (meaning this test can be used) for the duration of the COVID-19 declaration under Section 56 4(b)(1) of the Act, 21 U.S.C. section 360bbb-3(b)(1), unless the authorization is terminated or revoked sooner. Performed at [facility], [place] [address] [place], [place] [address]          Radiology Studies: Dg Chest 2 View  Result Date: 11/16/2018 CLINICAL DATA:  Shortness of breath. EXAM: CHEST - 2 VIEW COMPARISON:  October 16, 2016. FINDINGS: The heart size is enlarged. There is volume overload with mild developing pulmonary edema. There is a dual chamber left-sided pacemaker. The patient is status post prior median sternotomy. Aortic calcifications are noted. There are degenerative changes throughout the thoracic spine. There is no pneumothorax. IMPRESSION: Cardiomegaly with vascular congestion and possible early developing pulmonary edema. Electronically Signed   By: Constance Holster M.D.   On: 11/16/2018 21:56        Scheduled Meds:  carvedilol  12.5 mg Oral BID WC   clopidogrel  75 mg Oral Daily   furosemide  40 mg Intravenous BID   rivaroxaban  20 mg Oral Q supper   rosuvastatin  20 mg Oral q1800   sacubitril-valsartan  1 tablet Oral BID   sodium chloride flush  3 mL Intravenous Q12H   spironolactone  12.5 mg Oral Daily   Continuous Infusions:  sodium chloride       LOS: 0 days    Time spent: 38 minutes spent on chart review, personally reviewed all imaging studies and labs, discussion with nursing staff, consultants, updating family and interview/physical exam; more than 50% of that time was spent in counseling and/or coordination of care.    Ledford Goodson J [place] DO [facility] Pager [phone]  If 7PM-7AM, please contact night-coverage [email or web address] Password [facility] 11/17/2018, 4:55 PM

## 2018-11-18 LAB — BASIC METABOLIC PANEL
Anion gap: 12 (ref 5–15)
BUN: 31 mg/dL — ABNORMAL HIGH (ref 8–23)
CO2: 30 mmol/L (ref 22–32)
Calcium: 9.3 mg/dL (ref 8.9–10.3)
Chloride: 98 mmol/L (ref 98–111)
Creatinine, Ser: 1.72 mg/dL — ABNORMAL HIGH (ref 0.61–1.24)
GFR calc Af Amer: 43 mL/min — ABNORMAL LOW (ref >60)
GFR calc non Af Amer: 37 mL/min — ABNORMAL LOW (ref >60)
Glucose, Bld: 117 mg/dL — ABNORMAL HIGH (ref 70–99)
Potassium: 4.1 mmol/L (ref 3.5–5.1)
Sodium: 140 mmol/L (ref 135–145)

## 2018-11-18 LAB — MAGNESIUM: Magnesium: 2.2 mg/dL (ref 1.7–2.4)

## 2018-11-18 MED ORDER — SODIUM CHLORIDE 0.9 % IV BOLUS
250.0000 mL | Freq: Once | INTRAVENOUS | Status: AC
  Administered 2018-11-18: 250 mL via INTRAVENOUS

## 2018-11-18 MED ORDER — FUROSEMIDE 40 MG PO TABS
40.0000 mg | ORAL_TABLET | Freq: Two times a day (BID) | ORAL | Status: DC
  Administered 2018-11-18: 40 mg via ORAL
  Filled 2018-11-18 (×3): qty 1

## 2018-11-18 NOTE — Progress Notes (Signed)
   11/18/18 2118  Vitals  BP (!) 88/58  MAP (mmHg) 68  BP Location Left Arm  BP Method Automatic  Pulse Rate (!) 55  MEWS Score  MEWS RR 0  MEWS Pulse 0  MEWS Systolic 1  MEWS LOC 0  MEWS Temp 0  MEWS Score 1  MEWS Score Color Green   Paged on call provider concerning patients BP. Pt asymptomatic. Pt due to receive entresto. Per on on call provider manual BP taken.  86/54. 250 mL bolus ordered. Will repeat Manual BP after completion of bolus.

## 2018-11-18 NOTE — Progress Notes (Signed)
PROGRESS NOTE    Roshard GAURAV BALDREE  ERX:[id number] DOB: 01/10/1941 DOA: 11/16/2018 PCP: Dione Housekeeper, MD    Brief Narrative:   Ricardo Hunt is a 78 y.o. male with medical history significant for coronary artery disease status post remote CABG and then 2 stents in February 2020, atrial fibrillation on Xarelto, complete heart block with pacer, and ischemic cardiomyopathy with EF 20% in June, now presenting to the emergency department for insidiously worsening dyspnea.  Patient reports that he has had some shortness of breath for the past 3 months or so, but has worsened significantly over the past couple days.  He reports some bilateral ankle swelling, but does not feel that this has changed much recently.  He denied chest pain, fevers, chills, or cough.  Patient believes his weight has been fairly stable.  He reports eating a lot of salty foods, but states that he always has and there has not been any increase in salt or fluid intake.  States that there has not been any medication changes since February and he has continued to be adherent to his regimen.  ED Course: Upon arrival to the ED, patient is found to be afebrile, saturating mid 90s on room air, slightly tachypneic, and with stable blood pressure.  EKG features a sinus rhythm with PVCs, fusion complexes, and LBBB.  Chest x-ray is notable for cardiomegaly and vascular congestion with concern for early pulmonary edema.  Chemistry panel is notable for a creatinine 1.37, similar to priors.  CBC is unremarkable.  Troponin is mildly elevated and BNP is elevated to 784 which is higher than priors.  Patient was given 40 mg IV Lasix in the emergency department a couple hours ago, but has not urinated much and remains dyspneic at rest, only able to ambulate a few steps without beginning to develop distress, and he will be observed for ongoing evaluation and management.    Assessment & Plan:   Principal Problem:   Acute on chronic systolic CHF  (congestive heart failure) (HCC) Active Problems:   Coronary artery disease involving native coronary artery of native heart with angina pectoris (HCC)   Complete heart block ([place]) s/p Pacemaker in 2014   Atrial fibrillation and flutter (HCC)   CKD (chronic kidney disease), stage III (HCC)   Acute on chronic systolic congestive heart failure Patient presenting from home with progressive shortness of breath and lower extremity edema.  BNP elevated to 784 with chest x-ray notable for cardiomegaly with vascular congestion and pulmonary edema.  Previous echo from June 2020 with EF 20%, diffuse HK, moderate LAE, mild-mod TR, and at least moderate AS.  --wt 81.6-->80.2kg --net negative 564mL past 24h and net negative 1.7L since admission --Continue home Coreg, Spironolactone and Entresto --will change from IV lasix back to PO lasix 40mg  BID today for bump in creatine (cr 1.37-->1.72) --Daily weights and strict I's and O's --Monitor renal function daily  CAD  No anginal complaints  --Continue Plavix, statin, and beta-blocker    Paroxysmal atrial fibrillation  Currently in normal sinus rhythm on telemetry. CHADS-VASc is 46 (age x2, CHF, CAD) --Continue Xarelto and beta-blocker    Complete heart block  Pacemaker placed in 2014, denies lightheadedness, palpitations, or syncope . His electrophysiologist is planning for upgrade at some point  --Continue to monitor on telemetry  DVT prophylaxis: Xarelto Code Status: Full code Family Communication: None Disposition Plan: Continue inpatient hospitalization, PT/OT consult pending, further dependent on clinical course   Consultants:   none  Procedures:  None  Antimicrobials:   None   Subjective: Patient seen and examined at bedside, resting comfortably.  Reports good urine output with IV furosemide.  Shortness of breath about improved to his normal baseline.  Continues with global weakness.  No other specific complaints or concerns  at this time.  Denies headache, no fever/chills/night sweats, no nausea/vomiting/diarrhea, no chest pain, no palpitations, no abdominal pain, no paresthesias.  No acute events overnight per nursing staff.  Objective: Vitals:   11/17/18 1924 11/17/18 2123 11/18/18 0446 11/18/18 0848  BP: 108/60 106/65 98/69 (!) 100/55  Pulse: (!) 55 68 71 (!) 56  Resp:   18 18  Temp: 98 F (36.7 C)  97.8 F (36.6 C) 98.4 F (36.9 C)  TempSrc: Oral  Oral Oral  SpO2: 95%  97% 92%  Weight:   80.2 kg   Height:        Intake/Output Summary (Last 24 hours) at 11/18/2018 1018 Last data filed at 11/18/2018 6644 Gross per 24 hour  Intake 600 ml  Output 1175 ml  Net -575 ml   Filed Weights   11/16/18 2139 11/18/18 0446  Weight: 81.6 kg 80.2 kg    Examination:  General exam: Appears calm and comfortable  Respiratory system: Breath sounds decreased bilateral bases with rales/crackles, no wheezes, normal respiratory effort, currently on room air Cardiovascular system: S1 & S2 heard, RRR. No JVD, murmurs, rubs, gallops or clicks.  Trace pedal edema noted bilateral lower extremities to level of ankle Gastrointestinal system: Abdomen is nondistended, soft and nontender. No organomegaly or masses felt. Normal bowel sounds heard. Central nervous system: Alert and oriented. No focal neurological deficits. Extremities: Symmetric 5 x 5 power. Skin: No rashes, lesions or ulcers Psychiatry: Judgement and insight appear normal. Mood & affect appropriate.     Data Reviewed: I have personally reviewed following labs and imaging studies  CBC: Recent Labs  Lab 11/16/18 2135  WBC 10.4  HGB 15.9  HCT 48.2  MCV 98.0  PLT 034   Basic Metabolic Panel: Recent Labs  Lab 11/16/18 2135 11/18/18 0426  NA 138 140  K 4.6 4.1  CL 103 98  CO2 24 30  GLUCOSE 156* 117*  BUN 27* 31*  CREATININE 1.37* 1.72*  CALCIUM 9.6 9.3  MG  --  2.2   GFR: Estimated Creatinine Clearance: 33.1 mL/min (A) (by C-G formula  based on SCr of 1.72 mg/dL (H)). Liver Function Tests: No results for input(s): AST, ALT, ALKPHOS, BILITOT, PROT, ALBUMIN in the last 168 hours. No results for input(s): LIPASE, AMYLASE in the last 168 hours. No results for input(s): AMMONIA in the last 168 hours. Coagulation Profile: No results for input(s): INR, PROTIME in the last 168 hours. Cardiac Enzymes: No results for input(s): CKTOTAL, CKMB, CKMBINDEX, TROPONINI in the last 168 hours. BNP (last 3 results) No results for input(s): PROBNP in the last 8760 hours. HbA1C: No results for input(s): HGBA1C in the last 72 hours. CBG: No results for input(s): GLUCAP in the last 168 hours. Lipid Profile: No results for input(s): CHOL, HDL, LDLCALC, TRIG, CHOLHDL, LDLDIRECT in the last 72 hours. Thyroid Function Tests: No results for input(s): TSH, T4TOTAL, FREET4, T3FREE, THYROIDAB in the last 72 hours. Anemia Panel: No results for input(s): VITAMINB12, FOLATE, FERRITIN, TIBC, IRON, RETICCTPCT in the last 72 hours. Sepsis Labs: No results for input(s): PROCALCITON, LATICACIDVEN in the last 168 hours.  Recent Results (from the past 240 hour(s))  SARS CORONAVIRUS 2 Nasal Swab Aptima Multi Swab  Status: None   Collection Time: 11/17/18  2:14 AM   Specimen: Aptima Multi Swab; Nasal Swab  Result Value Ref Range Status   SARS Coronavirus 2 NEGATIVE NEGATIVE Final    Comment: (NOTE) SARS-CoV-2 target nucleic acids are NOT DETECTED. The SARS-CoV-2 RNA is generally detectable in upper and lower respiratory specimens during the acute phase of infection. Negative results do not preclude SARS-CoV-2 infection, do not rule out co-infections with other pathogens, and should not be used as the sole basis for treatment or other patient management decisions. Negative results must be combined with clinical observations, patient history, and epidemiological information. The expected result is Negative. Fact Sheet for Patients:  SugarRoll.be Fact Sheet for Healthcare Providers: https://www.woods-mathews.com/ This test is not yet approved or cleared by the [place] FDA and  has been authorized for detection and/or diagnosis of SARS-CoV-2 by FDA under an Emergency Use Authorization (EUA). This EUA will remain  in effect (meaning this test can be used) for the duration of the COVID-19 declaration under Section 56 4(b)(1) of the Act, 21 U.S.C. section 360bbb-3(b)(1), unless the authorization is terminated or revoked sooner. Performed at [facility], [place] [address], [place], [place] [address]          Radiology Studies: Dg Chest 2 View  Result Date: 11/16/2018 CLINICAL DATA:  Shortness of breath. EXAM: CHEST - 2 VIEW COMPARISON:  October 16, 2016. FINDINGS: The heart size is enlarged. There is volume overload with mild developing pulmonary edema. There is a dual chamber left-sided pacemaker. The patient is status post prior median sternotomy. Aortic calcifications are noted. There are degenerative changes throughout the thoracic spine. There is no pneumothorax. IMPRESSION: Cardiomegaly with vascular congestion and possible early developing pulmonary edema. Electronically Signed   By: Constance Holster M.D.   On: 11/16/2018 21:56        Scheduled Meds: . carvedilol  12.5 mg Oral BID WC  . clopidogrel  75 mg Oral Daily  . furosemide  40 mg Oral BID  . rivaroxaban  20 mg Oral Q supper  . rosuvastatin  20 mg Oral q1800  . sacubitril-valsartan  1 tablet Oral BID  . sodium chloride flush  3 mL Intravenous Q12H  . spironolactone  12.5 mg Oral Daily   Continuous Infusions: . sodium chloride       LOS: 1 day    Time spent: 36 minutes spent on chart review, personally reviewed all imaging studies and labs, discussion with nursing staff, consultants, updating family and interview/physical exam; more than 50% of that time was spent in counseling and/or  coordination of care.    Akirra Lacerda J [place] DO [facility] Pager [phone]  If 7PM-7AM, please contact night-coverage [email or web address] Password [facility] 11/18/2018, 10:18 AM

## 2018-11-18 NOTE — Evaluation (Signed)
Physical Therapy Evaluation Patient Details Name: Donne CURRAN LENDERMAN MRN: [id number] DOB: [birth year] Today's Date: 11/18/2018   History of Present Illness  78 y.o. male with medical history significant for vertigo, coronary artery disease status post remote CABG and then 2 stents in February 2020, atrial fibrillation on Xarelto, complete heart block with pacer, and ischemic cardiomyopathy with EF 20% in June, now presenting to the emergency department for insidiously worsening dyspnea. BNP is elevated to 784, chest x-ray notable for cardiomegaly with vascular congestion and pulmonary edema. acute on chronic CHF  Clinical Impression   Pt admitted with above diagnosis. Patient with no imbalance during functional tasks, however admits to occasional vertigo and does have imbalance with standing with eyes closed. Unable to elicit vertigo with functional mobility today.  Pt currently with functional limitations due to the deficits listed below (see PT Problem List). Pt may benefit from skilled PT to increase their independence and safety with mobility to allow discharge to the venue listed below.       Follow Up Recommendations No PT follow up  (unless vertigo can be re-produced);Supervision - Intermittent   Equipment Recommendations  None recommended by PT    Recommendations for Other Services OT consult(for energy conservation with ADLs)     Precautions / Restrictions Precautions Precautions: Fall Precaution Comments: denies h/o falls; reports things spin when he first stands up sometimes and he just sits back down until it passes      Mobility  Bed Mobility Overal bed mobility: Independent             General bed mobility comments: HOB flat, no rail  Transfers Overall transfer level: Needs assistance Equipment used: None Transfers: Sit to/from [facility] Sit to Stand: Supervision Stand pivot transfers: Supervision       General transfer comment: for safety;  denied vertigo at this time; no imbalance  Ambulation/Gait Ambulation/Gait assistance: Min guard;Modified independent (Device/Increase time) Gait Distance (Feet): 45 Feet Assistive device: None Gait Pattern/deviations: Step-through pattern;Decreased stride length Gait velocity: decr   General Gait Details: wanted to stay in room; avoided multiple objects and made multiple turns with no imbalance  Stairs            Wheelchair Mobility    Modified Rankin (Stroke Patients Only)       Balance Overall balance assessment: Needs assistance   Sitting balance-Leahy Scale: Good     Standing balance support: No upper extremity supported Standing balance-Leahy Scale: Good           Rhomberg - Eyes Opened: 0(imbalance with independent recovery as he attempted) Rhomberg - Eyes Closed: 7(feet apart, could not do rhomberg)                 Pertinent Vitals/Pain Pain Assessment: No/denies pain    Home Living Family/patient expects to be discharged to:: Private residence Living Arrangements: Alone Available Help at Discharge: Family;Available PRN/intermittently(reports children nearby and check in daily) Type of Home: House Home Access: Stairs to enter Entrance Stairs-Rails: Right Entrance Stairs-Number of Steps: 2 Home Layout: One level Home Equipment: Walker - 2 wheels;Bedside commode(was his wife's)      Prior Function Level of Independence: Independent         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...my son does the rest"     Hand Dominance        Extremity/Trunk Assessment   Upper Extremity Assessment Upper Extremity Assessment: Defer to OT evaluation  Lower Extremity Assessment Lower Extremity Assessment: Overall WFL for tasks assessed       Communication   Communication: No difficulties  Cognition Arousal/Alertness: Awake/alert Behavior During Therapy: Flat affect Overall Cognitive Status: Within Functional  Limits for tasks assessed                                        General Comments General comments (skin integrity, edema, etc.): states he doesn't feel well; reports feels he's not breathing as well; noted multiple PVCs on tele and spoke with tele personnel who reported pt's paced rhythm with multiple PVCs has been his baseline with no changes detected    Exercises     Assessment/Plan    PT Assessment Patient needs continued PT services  PT Problem List Decreased activity tolerance;Decreased balance;Decreased knowledge of use of DME       PT Treatment Interventions DME instruction;Gait training;Functional mobility training;Therapeutic activities;Therapeutic exercise;Balance training;Patient/family education    PT Goals (Current goals can be found in the Care Plan section)  Acute Rehab PT Goals Patient Stated Goal: feel better and go home to take care of farm PT Goal Formulation: With patient Time For Goal Achievement: 12/02/18 Potential to Achieve Goals: Good    Frequency Min 3X/week   Barriers to discharge        Co-evaluation               AM-PAC PT "6 Clicks" Mobility  Outcome Measure Help needed turning from your back to your side while in a flat bed without using bedrails?: None Help needed moving from lying on your back to sitting on the side of a flat bed without using bedrails?: None Help needed moving to and from a bed to a chair (including a wheelchair)?: None Help needed standing up from a chair using your arms (e.g., wheelchair or bedside chair)?: None Help needed to walk in hospital room?: None Help needed climbing 3-5 steps with a railing? : None 6 Click Score: 24    End of Session   Activity Tolerance: Patient limited by fatigue Patient left: in chair;with call bell/phone within reach Nurse Communication: Mobility status PT Visit Diagnosis: Unsteadiness on feet (R26.81)    Time: 5615-3794 PT Time Calculation (min) (ACUTE  ONLY): 22 min   Charges:   PT Evaluation $PT Eval Low Complexity: 1 Low            Barry Brunner, PT      [facility] 11/18/2018, 11:29 AM

## 2018-11-19 LAB — BASIC METABOLIC PANEL
Anion gap: 9 (ref 5–15)
BUN: 42 mg/dL — ABNORMAL HIGH (ref 8–23)
CO2: 28 mmol/L (ref 22–32)
Calcium: 9 mg/dL (ref 8.9–10.3)
Chloride: 101 mmol/L (ref 98–111)
Creatinine, Ser: 1.95 mg/dL — ABNORMAL HIGH (ref 0.61–1.24)
GFR calc Af Amer: 37 mL/min — ABNORMAL LOW (ref >60)
GFR calc non Af Amer: 32 mL/min — ABNORMAL LOW (ref >60)
Glucose, Bld: 134 mg/dL — ABNORMAL HIGH (ref 70–99)
Potassium: 4.3 mmol/L (ref 3.5–5.1)
Sodium: 138 mmol/L (ref 135–145)

## 2018-11-19 LAB — MAGNESIUM: Magnesium: 2.2 mg/dL (ref 1.7–2.4)

## 2018-11-19 MED ORDER — RIVAROXABAN 15 MG PO TABS
15.0000 mg | ORAL_TABLET | Freq: Every day | ORAL | Status: DC
  Administered 2018-11-19 – 2018-11-20 (×2): 15 mg via ORAL
  Filled 2018-11-19 (×2): qty 1

## 2018-11-19 MED ORDER — FUROSEMIDE 20 MG PO TABS
20.0000 mg | ORAL_TABLET | Freq: Two times a day (BID) | ORAL | Status: DC
  Administered 2018-11-19 – 2018-11-20 (×2): 20 mg via ORAL
  Filled 2018-11-19 (×5): qty 1

## 2018-11-19 MED ORDER — CARVEDILOL 6.25 MG PO TABS
6.2500 mg | ORAL_TABLET | Freq: Two times a day (BID) | ORAL | Status: DC
  Administered 2018-11-19 – 2018-11-20 (×2): 6.25 mg via ORAL
  Filled 2018-11-19 (×5): qty 1

## 2018-11-19 NOTE — Progress Notes (Signed)
Physical Therapy Treatment Patient Details Name: Ricardo Hunt MRN: [id number] DOB: 22-Jul-1940 Today's Date: 11/19/2018    History of Present Illness 78 y.o. male with medical history significant for vertigo, coronary artery disease status post remote CABG and then 2 stents in February 2020, atrial fibrillation on Xarelto, complete heart block with pacer, and ischemic cardiomyopathy with EF 20% in June, now presenting to the emergency department for insidiously worsening dyspnea. BNP is elevated to 784, chest x-ray notable for cardiomegaly with vascular congestion and pulmonary edema. acute on chronic CHF    PT Comments    Patient demonstrates symptoms consistent with R peripheral vestibular hypofunction.  He may have a small component of BPPV though not detectible today with testing he did report symptoms.  Discussed benefit of outpatient vestibular rehab to help with balance issues which he does report are an issue.  He may or may not participate, but worthy of referral to [facility] outpatient.  PT to follow acutely.   Orthostatic VS for the past 24 hrs (Last 3 readings):  BP- Sitting Pulse- Sitting BP- Standing at 0 minutes Pulse- Standing at 0 minutes BP- Standing at 3 minutes Pulse- Standing at 3 minutes  11/19/18 1100 99/62 70 112/68 74 92/62 77    Follow Up Recommendations  Outpatient PT     Equipment Recommendations  None recommended by PT    Recommendations for Other Services       Precautions / Restrictions Precautions Precautions: Fall Precaution Comments: denies h/o falls; reports things spin when he first stands up sometimes and he just sits back down until it passes    Mobility  Bed Mobility Overal bed mobility: Independent             General bed mobility comments: sitting in recliner upon arrival  Transfers Overall transfer level: Modified independent Equipment used: None Transfers: Sit to/from [facility] Sit to Stand:  Supervision Stand pivot transfers: Supervision       General transfer comment: for safety; denied vertigo at this time; no imbalance  Ambulation/Gait                 Stairs             Wheelchair Mobility    Modified Rankin (Stroke Patients Only)       Balance Overall balance assessment: Needs assistance Sitting-balance support: No upper extremity supported Sitting balance-Leahy Scale: Good     Standing balance support: No upper extremity supported Standing balance-Leahy Scale: Good                              Cognition Arousal/Alertness: Awake/alert Behavior During Therapy: WFL for tasks assessed/performed Overall Cognitive Status: Within Functional Limits for tasks assessed                                        Exercises      General Comments General comments (skin integrity, edema, etc.): standing during orthostatic testing; educated on peripheral vestibular hypofunction and possible benefit to balance training & vestibular adaptation especially after hospitalization.  He said he probably wouldn't go but then said okay to set it up and closest place is [facility]   Vestibular Assessment - 11/19/18 1247      Symptom Behavior   Subjective history of current problem  Patient reports "vertigo" due to "  inner ear" for years.  Had been treated with medication at one point, but feels it has basically been affecting him since '96 when he had open heart surgery.  Describes as momentary spinning when lying down in bed.  It does affect his balance as states if he can't grab onto something at times when standing up he will end up in the floor.     Type of Dizziness   Spinning    Frequency of Dizziness  intermittent    Duration of Dizziness  seconds    Symptom Nature  Positional;Variable;Intermittent    Aggravating Factors  Rolling to right;Lying supine   sit to supine   Relieving Factors  Closing eyes    Progression of  Symptoms  Better      Oculomotor Exam   Oculomotor Alignment  Abnormal   L eye lower   Ocular ROM  WNL    Spontaneous  Absent    Gaze-induced   Age appropriate nystagmus at end range    Smooth Pursuits  Intact    Saccades  Poor trajectory;Slow      Vestibulo-Ocular Reflex   VOR 1 Head Only (x 1 viewing)  difficulty with target maintentance initially and hard to coordinate keeping eyes still while moving head; denies change in symptoms with horizontal and vertical movements    VOR to Slow Head Movement  Positive right    VOR Cancellation  Normal      Auditory   Comments  intact and equal to scratch test, though reports some issues due to working in [employer] years ago      Positional Testing   Sidelying Test  Sidelying Right;Sidelying Left    Horizontal Canal Testing  Horizontal Canal Right;Horizontal Canal Left      Sidelying Right   Sidelying Right Duration  30 s    Sidelying Right Symptoms  No nystagmus   but positive for spinning initially     Sidelying Left   Sidelying Left Duration  30 s    Sidelying Left Symptoms  No nystagmus      Horizontal Canal Right   Horizontal Canal Right Duration  30 s    Horizontal Canal Right Symptoms  Normal      Horizontal Canal Left   Horizontal Canal Left Duration  30 s    Horizontal Canal Left Symptoms  Normal           Pertinent Vitals/Pain Pain Assessment: No/denies pain    Home Living Family/patient expects to be discharged to:: Private residence Living Arrangements: Alone Available Help at Discharge: Family;Available PRN/intermittently(reports children nearby and check in daily) Type of Home: House Home Access: Stairs to enter Entrance Stairs-Rails: Right Home Layout: One level Home Equipment: Walker - 2 wheels;Bedside commode(was his wife's) Additional Comments: uses tub shower    Prior Function Level of Independence: Independent      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...my son  does the rest"   PT Goals (current goals can now be found in the care plan section) Acute Rehab PT Goals Patient Stated Goal: continue to take care of farm Progress towards PT goals: Progressing toward goals    Frequency    Min 3X/week      PT Plan      Co-evaluation              AM-PAC PT "6 Clicks" Mobility   Outcome Measure  Help needed turning from your back to your  side while in a flat bed without using bedrails?: None Help needed moving from lying on your back to sitting on the side of a flat bed without using bedrails?: None Help needed moving to and from a bed to a chair (including a wheelchair)?: None Help needed standing up from a chair using your arms (e.g., wheelchair or bedside chair)?: None Help needed to walk in hospital room?: None Help needed climbing 3-5 steps with a railing? : None 6 Click Score: 24    End of Session   Activity Tolerance: Patient tolerated treatment well Patient left: in bed;with call bell/phone within reach   PT Visit Diagnosis: Unsteadiness on feet (R26.81);Dizziness and giddiness (R42)     Time: 3383-2919 PT Time Calculation (min) (ACUTE ONLY): 28 min  Charges:  $Therapeutic Activity: 8-22 mins $Physical Performance Test: 8-22 mins                     Magda Kiel, [place] [facility] [phone] 11/19/2018    Reginia Naas 11/19/2018, 1:13 PM

## 2018-11-19 NOTE — TOC Progression Note (Signed)
Transition of Care [facility]) - Progression Note    Patient Details  Name: Ricardo Hunt MRN: [id number] Date of Birth: 05-Jan-1941  Transition of Care [facility]) CM/SW Contact  Carles Collet, RN Phone Number: 11/19/2018, 1:59 PM  Clinical Narrative:   Spoke w patient again at bedside, he declined [facility] services, stating he has had it before and did not believe it did very much. Offered to make referral to outpatient therapies, he made a face and said no. Instructed patient to consider them and let us know if changes his mind so we can make referral before he leaves the hospital.     Expected Discharge Plan: Home/Self Care Barriers to Discharge: Continued Medical Work up  Expected Discharge Plan and Services Expected Discharge Plan: Home/Self Care   Discharge Planning Services: CM Consult   Living arrangements for the past 2 months: Mobile Home(double wide)                           HH Arranged: Patient Refused HH           Social Determinants of Health (SDOH) Interventions    Readmission Risk Interventions No flowsheet data found.

## 2018-11-19 NOTE — TOC Initial Note (Signed)
Transition of Care [facility]) - Initial/Assessment Note    Patient Details  Name: Ricardo Hunt MRN: [id number] Date of Birth: [birth year]  Transition of Care [facility]) CM/SW Contact:    Carles Collet, RN Phone Number: 11/19/2018, 10:40 AM  Clinical Narrative:             HF consult ordered. Spoke w patient at bedside. He states that he lives at home alone in a double wide mobile home. He wife is recently deceased. At home he has access to RW WC cane, but he does not use any of this equipment, he is independently ambulatory. He states that his PCP is retired and he goes to Dr Stanford Breed for cardiology who writes his Rxs. He denies any difficulties getting medications or to MD. He states ho daughter assists with anything else he needs.  He states he has a scale.       Expected Discharge Plan: Home/Self Care Barriers to Discharge: Continued Medical Work up   Patient Goals and CMS Choice Patient states their goals for this hospitalization and ongoing recovery are:: to return home      Expected Discharge Plan and Services Expected Discharge Plan: Home/Self Care   Discharge Planning Services: CM Consult   Living arrangements for the past 2 months: Mobile Home(double wide)                                      Prior Living Arrangements/Services Living arrangements for the past 2 months: Mobile Home(double wide) Lives with:: Self Patient language and need for interpreter reviewed:: Yes Do you feel safe going back to the place where you live?: Yes            Criminal Activity/Legal Involvement Pertinent to Current Situation/Hospitalization: No - Comment as needed  Activities of Daily Living Home Assistive Devices/Equipment: None ADL Screening (condition at time of admission) Patient's cognitive ability adequate to safely complete daily activities?: Yes Is the patient deaf or have difficulty hearing?: Yes Does the patient have difficulty seeing, even when wearing glasses/contacts?:  No Does the patient have difficulty concentrating, remembering, or making decisions?: No Patient able to express need for assistance with ADLs?: Yes Does the patient have difficulty dressing or bathing?: No Independently performs ADLs?: Yes (appropriate for developmental age) Does the patient have difficulty walking or climbing stairs?: Yes Weakness of Legs: Both Weakness of Arms/Hands: None  Permission Sought/Granted                  Emotional Assessment              Admission diagnosis:  Acute on chronic congestive heart failure, unspecified heart failure type ([place]) [I50.9] Acute on chronic systolic CHF (congestive heart failure) ([place]) [I50.23] Patient Active Problem List   Diagnosis Date Noted  . Atrial fibrillation and flutter ([place])   . CKD (chronic kidney disease), stage III ([place])   . AKI (acute kidney injury) ([place]) 05/25/2018  . Chronic systolic CHF (congestive heart failure) ([place]) 05/20/2018  . Dyspnea on exertion 05/20/2018  . Acute on chronic systolic CHF (congestive heart failure) ([place]) 05/20/2018  . S/P lumbar laminectomy 07/19/2016  . Torticollis, acute 01/21/2015  . Personal history of colonic polyps-tubular adenomas 08/31/2013  . Complete heart block [facility]) s/p Pacemaker in 2014 10/15/2012  . S/P CABG (coronary artery bypass graft) 10/06/2012  . Rib fracture 10/05/2012  . Syncope 10/03/2012  . Coronary artery disease  involving native coronary artery of native heart with angina pectoris ([place]) 10/03/2012  . Hypertension 10/03/2012  . Hyperlipidemia 10/03/2012  . Thrombocytopenia ([place]) 10/03/2012  . Dehydration 10/03/2012   PCP:  Dione Housekeeper, MD Pharmacy:   [facility], [place] [address] Phone: [phone] Fax: [place], [address] [place] [address] Phone: [phone] Fax: [phone]     Social Determinants of  Health (SDOH) Interventions    Readmission Risk Interventions No flowsheet data found.

## 2018-11-19 NOTE — Progress Notes (Signed)
Occupational Therapy Evaluation Patient Details Name: Ricardo Hunt MRN: [id number] DOB: [birth year] Today's Date: 11/19/2018    History of Present Illness 78 y.o. male with medical history significant for vertigo, coronary artery disease status post remote CABG and then 2 stents in February 2020, atrial fibrillation on Xarelto, complete heart block with pacer, and ischemic cardiomyopathy with EF 20% in June, now presenting to the emergency department for insidiously worsening dyspnea. BNP is elevated to 784, chest x-ray notable for cardiomegaly with vascular congestion and pulmonary edema. 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 Pt currently requires S for ADL and functional mobility. Educated pt on energy conservation strategies with provided handout. Pt reported he does not want [place] services at this time, educated pt HHOT would be beneficial to continue to maximize pt's independence and safety with ADL/IADL and functional mobility. Due to decline in current level of function, pt would benefit from acute OT to address established goals to facilitate safe D/C to venue listed below. At this time, recommend HHOT follow-up. Will continue to follow acutely.     Follow Up Recommendations  Home health OT;Supervision - Intermittent    Equipment Recommendations  None recommended by OT    Recommendations for Other Services       Precautions / Restrictions Precautions Precautions: Fall Precaution Comments: denies h/o falls; reports things spin when he first stands up sometimes and he just sits back down until it passes      Mobility Bed Mobility               General bed mobility comments: sitting in recliner upon arrival  Transfers Overall transfer level: Needs assistance Equipment used: None Transfers: Sit to/from  Stand;Stand Pivot Transfers Sit to Stand: Supervision Stand pivot transfers: Supervision       General transfer comment: for safety; denied vertigo at this time; no imbalance    Balance Overall balance assessment: Needs assistance Sitting-balance support: No upper extremity supported Sitting balance-Leahy Scale: Good     Standing balance support: No upper extremity supported Standing balance-Leahy Scale: Good                             ADL either performed or assessed with clinical judgement   ADL Overall ADL's : Needs assistance/impaired Eating/Feeding: Modified independent   Grooming: Standing;Min guard   Upper Body Bathing: Supervision/ safety   Lower Body Bathing: Supervison/ safety   Upper Body Dressing : Supervision/safety   Lower Body Dressing: Supervision/safety   Toilet Transfer: Min guard   Toileting- Clothing Manipulation and Hygiene: Supervision/safety   Tub/ Banker: Supervision/safety   Functional mobility during ADLs: Min guard General ADL Comments: educated pt on energy conservation strategies with provided handout;pt required vc for use of energy conservation strategies during ADL completion;HR up to 88bpm with activity;noted multiple PVCs on tele during grooming task while standing, initiated seated rest break prior to returning to recliner     Vision Baseline Vision/History: Wears glasses Wears Glasses: Reading only Patient Visual Report: No change from baseline       Perception     Praxis      Pertinent Vitals/Pain Pain Assessment: No/denies pain     Hand Dominance Right   Extremity/Trunk Assessment Upper Extremity Assessment Upper Extremity Assessment: Overall WFL for  tasks assessed   Lower Extremity Assessment Lower Extremity Assessment: Overall WFL for tasks assessed   Cervical / Trunk Assessment Cervical / Trunk Assessment: Normal   Communication Communication Communication: No difficulties    Cognition Arousal/Alertness: Awake/alert Behavior During Therapy: Flat affect Overall Cognitive Status: Within Functional Limits for tasks assessed                                     General Comments       Exercises     Shoulder Instructions      Home Living Family/patient expects to be discharged to:: Private residence Living Arrangements: Alone Available Help at Discharge: Family;Available PRN/intermittently(reports children nearby and check in daily) Type of Home: House Home Access: Stairs to enter [employer] of Steps: 2 Entrance Stairs-Rails: Right Home Layout: One level     Bathroom Shower/Tub: Tub/shower unit;Walk-in shower   Bathroom Toilet: Standard     Home Equipment: Environmental consultant - 2 wheels;Bedside commode(was his wife's)   Additional Comments: uses tub shower      Prior Functioning/Environment Level of Independence: Independent        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...my son does the rest"        OT Problem List: Decreased strength;Decreased activity tolerance;Impaired balance (sitting and/or standing);Decreased safety awareness;Decreased knowledge of use of DME or AE;Cardiopulmonary status limiting activity      OT Treatment/Interventions: Self-care/ADL training;Therapeutic exercise;Energy conservation;DME and/or AE instruction;Therapeutic activities;Patient/family education;Balance training    OT Goals(Current goals can be found in the care plan section) Acute Rehab OT Goals Patient Stated Goal: continue to take care of farm OT Goal Formulation: With patient Time For Goal Achievement: 12/03/18 Potential to Achieve Goals: Good ADL Goals Pt Will Perform Grooming: with modified independence Pt Will Perform Upper Body Dressing: with modified independence Pt Will Perform Lower Body Dressing: with modified independence Pt Will Transfer to Toilet: with modified  independence;ambulating Additional ADL Goal #1: Pt will independently demonstrate use of 3 energy conservation strategies.  OT Frequency: Min 2X/week   Barriers to D/C: Decreased caregiver support;Inaccessible home environment  pt has family available PRN;pt runs a farm       Co-evaluation              AM-PAC OT "6 Clicks" Daily Activity     Outcome Measure Help from another person eating meals?: None Help from another person taking care of personal grooming?: A Little Help from another person toileting, which includes using toliet, bedpan, or urinal?: A Little Help from another person bathing (including washing, rinsing, drying)?: A Little Help from another person to put on and taking off regular upper body clothing?: A Little Help from another person to put on and taking off regular lower body clothing?: A Little 6 Click Score: 19   End of Session Equipment Utilized During Treatment: Gait belt Nurse Communication: Mobility status  Activity Tolerance: Patient tolerated treatment well Patient left: in chair;with call bell/phone within reach  OT Visit Diagnosis: Unsteadiness on feet (R26.81);Other abnormalities of gait and mobility (R26.89)                Time: 1123-1140 OT Time Calculation (min): 17 min Charges:  OT General Charges $OT Visit: 1 Visit OT Evaluation $OT Eval Low Complexity: [place] OTR/L Acute Rehabilitation Services Office: [place] 11/19/2018, 12:33 PM

## 2018-11-19 NOTE — Progress Notes (Signed)
PROGRESS NOTE  Ricardo Hunt:[id number] DOB: 04/30/1940 DOA: 11/16/2018 PCP: Dione Housekeeper, MD  Brief History   Ricardo Hunt a 78 y.o.malewith medical history significant forcoronary artery disease status postremoteCABGand then 2 stents in February 2020, atrial fibrillation on Xarelto, complete heart block with pacer, and ischemic cardiomyopathy with EF 20% in June, now presenting to the emergency department for insidiously worsening dyspnea. Patient reports that he has had some shortness of breath for the past 3 months or so, but has worsened significantly over the past couple days.He reports some bilateral ankle swelling, but does not feel that this has changed much recently. He denied chest pain, fevers, chills, or cough.Patient believes his weight has been fairly stable. He reports eating a lot of salty foods, but states that he always has and there has not been any increase in salt or fluid intake. States that there has not been any medication changes since February and he has continued to be adherent tohis regimen.  ED Course:Upon arrival to the ED, patient is found to be afebrile, saturating mid 90s on room air, slightly tachypneic, and with stable blood pressure. EKG features a sinus rhythm with PVCs, fusion complexes, and LBBB. Chest x-ray is notable for cardiomegaly and vascular congestion with concern for early pulmonary edema. Chemistry panel is notable for a creatinine 1.37, similar to priors. CBC is unremarkable. Troponin is mildly elevated and BNP is elevated to 784 which is higher than priors. Patient was given 40 mg IV Lasix in the emergency department a couple hours ago, but has not urinated much and remains dyspneic at rest, only able to ambulate a few steps without beginning to develop distress, and he will be observed for ongoing evaluation and management.  The patient was admitted to a telemetry bed. He was diuresed, but has been de-escalated to oral  lasix 40 mg bid due to a bump in creatinine and hypotension. Beta blocker and lasix doses have been decreased. Monitor. Ambulate.  Consultants  . None  Procedures  . None  Antibiotics   Anti-infectives (From admission, onward)   None    .  Subjective  The patient is resting comfortably. No new complaints.   Objective   Vitals:  Vitals:   11/19/18 0451 11/19/18 1043  BP: 96/61 91/68  Pulse: 72 74  Resp: 18 16  Temp: 98.4 F (36.9 C) 98.2 F (36.8 C)  SpO2: 95% 97%    Exam:  Constitutional:  . The patient is awake, alert, and oriented x 3. No acute distress. Marland Kitchen  Respiratory:  . No increased work of breathing. . No wheezes, rales, or rhonchi . No tactile fremitus. Cardiovascular:  . Regular rate and rhythm. . No murmurs, ectopy, or gallups.  . No lateral PMI. No thrills. Abdomen:  . Abdomen is soft, non-tender, non-distended. . No hernias, masses, or hernias are appreciated. . Normoactive bowel sounds. Musculoskeletal:  . No cyanosis, clubbing, or edema. Skin:  . No rashes, lesions, ulcers . palpation of skin: no induration or nodules Neurologic:  . CN 2-12 intact . Sensation all 4 extremities intact Psychiatric:  . Mental status o Mood, affect appropriate o Orientation to person, place, time  . judgment and insight appear intact   I have personally reviewed the following:   Today's Data  . Vitals, BMP  Cardiology Data  . Echo 09/2018 EF 20% with global hypokinesis.  Scheduled Meds: . carvedilol  6.25 mg Oral BID WC  . clopidogrel  75 mg Oral Daily  .  furosemide  20 mg Oral BID  . rivaroxaban  15 mg Oral Q supper  . rosuvastatin  20 mg Oral q1800  . sacubitril-valsartan  1 tablet Oral BID  . sodium chloride flush  3 mL Intravenous Q12H  . spironolactone  12.5 mg Oral Daily   Continuous Infusions: . sodium chloride      Principal Problem:   Acute on chronic systolic CHF (congestive heart failure) (HCC) Active Problems:   Coronary artery  disease involving native coronary artery of native heart with angina pectoris (HCC)   Complete heart block (HCC) s/p Pacemaker in 2014   Atrial fibrillation and flutter (HCC)   CKD (chronic kidney disease), stage III ([place])   LOS: 2 days   A & P  Acute on chronic systolic congestive heart failure Patient presenting from home with progressive shortness of breath and lower extremity edema.  BNP elevated to 784 with chest x-ray notable for cardiomegaly with vascular congestion and pulmonary edema.  Previous echo from June 2020 with EF 20%, diffuse HK, moderate LAE, mild-mod TR, and at least moderate AS. Fluid balance negative by 659.8. Lasix reduced to 20 mg bid due to hypotension and AKI. Coreg also reduced by 1/2. Consider cardiology/palliative care consult.  CAD: Continue home doses of plavix, statin and beta blocker.  Paroxysmal atrial fibrillation: Heart rate is controlled on beta blocker. Xarelto for CVA prophylaxis for CHADS-VASc of 4. Currently in normal sinus rhythm on telemetry.  Complete heart block: Pacemaker placed in 2014, denies lightheadedness, palpitations, or syncope. His electrophysiologist is planning for upgrade at some point. Monitor on telemetry.  DVT prophylaxis: Xarelto Code Status: Full code Family Communication: None Disposition Plan: Continue inpatient hospitalization, PT/OT consult pending, further dependent on clinical course  Cayetano Mikita, DO [facility] Direct contact: see [email or web address]  7PM-7AM contact night coverage as above 11/19/2018, 4:44 PM  LOS: 2 days

## 2018-11-19 NOTE — Plan of Care (Signed)
  Problem: Health Behavior/Discharge Planning: Goal: Ability to manage health-related needs will improve Outcome: Progressing   Problem: Clinical Measurements: Goal: Ability to maintain clinical measurements within normal limits will improve Outcome: Progressing Goal: Will remain free from infection Outcome: Progressing Goal: Diagnostic test results will improve Outcome: Progressing   

## 2018-11-20 LAB — BASIC METABOLIC PANEL
Anion gap: 7 (ref 5–15)
BUN: 37 mg/dL — ABNORMAL HIGH (ref 8–23)
CO2: 28 mmol/L (ref 22–32)
Calcium: 9.1 mg/dL (ref 8.9–10.3)
Chloride: 103 mmol/L (ref 98–111)
Creatinine, Ser: 1.42 mg/dL — ABNORMAL HIGH (ref 0.61–1.24)
GFR calc Af Amer: 54 mL/min — ABNORMAL LOW (ref >60)
GFR calc non Af Amer: 46 mL/min — ABNORMAL LOW (ref >60)
Glucose, Bld: 132 mg/dL — ABNORMAL HIGH (ref 70–99)
Potassium: 4.4 mmol/L (ref 3.5–5.1)
Sodium: 138 mmol/L (ref 135–145)

## 2018-11-20 MED ORDER — SODIUM CHLORIDE 0.9 % IV BOLUS
500.0000 mL | Freq: Once | INTRAVENOUS | Status: AC
  Administered 2018-11-20: 11:00:00 500 mL via INTRAVENOUS

## 2018-11-20 NOTE — Care Management Important Message (Signed)
Important Message  Patient Details  Name: Ricardo Hunt MRN: [id number] Date of Birth: 22-Jan-1941   Medicare Important Message Given:  Yes     Shelda Altes 11/20/2018, 1:39 PM

## 2018-11-20 NOTE — Progress Notes (Signed)
PROGRESS NOTE  Ricardo Hunt:[id number] DOB: 1940-06-09 DOA: 11/16/2018 PCP: Dione Housekeeper, MD  Brief History   Ricardo Hunt a 78 y.o.malewith medical history significant forcoronary artery disease status postremoteCABGand then 2 stents in February 2020, atrial fibrillation on Xarelto, complete heart block with pacer, and ischemic cardiomyopathy with EF 20% in June, now presenting to the emergency department for insidiously worsening dyspnea. Patient reports that he has had some shortness of breath for the past 3 months or so, but has worsened significantly over the past couple days.He reports some bilateral ankle swelling, but does not feel that this has changed much recently. He denied chest pain, fevers, chills, or cough.Patient believes his weight has been fairly stable. He reports eating a lot of salty foods, but states that he always has and there has not been any increase in salt or fluid intake. States that there has not been any medication changes since February and he has continued to be adherent tohis regimen.  ED Course:Upon arrival to the ED, patient is found to be afebrile, saturating mid 90s on room air, slightly tachypneic, and with stable blood pressure. EKG features a sinus rhythm with PVCs, fusion complexes, and LBBB. Chest x-ray is notable for cardiomegaly and vascular congestion with concern for early pulmonary edema. Chemistry panel is notable for a creatinine 1.37, similar to priors. CBC is unremarkable. Troponin is mildly elevated and BNP is elevated to 784 which is higher than priors. Patient was given 40 mg IV Lasix in the emergency department a couple hours ago, but has not urinated much and remains dyspneic at rest, only able to ambulate a few steps without beginning to develop distress, and he will be observed for ongoing evaluation and management.  The patient was admitted to a telemetry bed. He was diuresed, but has been de-escalated to oral  lasix 40 mg bid due to a bump in creatinine and hypotension. Beta blocker and lasix doses have been decreased. Monitor. Ambulate.   Consultants   None  Procedures   None  Antibiotics   Anti-infectives (From admission, onward)   None     Subjective  The patient is resting comfortably. Blood pressures have been low this morning. Entresto held as per parameters.  Objective   Vitals:  Vitals:   11/20/18 1616 11/20/18 1647  BP: 98/75 96/68  Pulse: 73 70  Resp: 16   Temp: 97.7 F (36.5 C)   SpO2: 96%     Exam:  Constitutional:   The patient is awake, alert, and oriented x 3. No acute distress.   Respiratory:   No increased work of breathing.  No wheezes, rales, or rhonchi  No tactile fremitus. Cardiovascular:   Regular rate and rhythm.  No murmurs, ectopy, or gallups.   No lateral PMI. No thrills. Abdomen:   Abdomen is soft, non-tender, non-distended.  No hernias, masses, or hernias are appreciated.  Normoactive bowel sounds. Musculoskeletal:   No cyanosis, clubbing, or edema. Skin:   No rashes, lesions, ulcers  palpation of skin: no induration or nodules Neurologic:   CN 2-12 intact  Sensation all 4 extremities intact Psychiatric:   Mental status o Mood, affect appropriate o Orientation to person, place, time   judgment and insight appear intact   I have personally reviewed the following:   Today's Data   Vitals, BMP  Cardiology Data   Echo 09/2018 EF 20% with global hypokinesis.  Scheduled Meds:  carvedilol  6.25 mg Oral BID WC   clopidogrel  75  mg Oral Daily   furosemide  20 mg Oral BID   rivaroxaban  15 mg Oral Q supper   rosuvastatin  20 mg Oral q1800   sacubitril-valsartan  1 tablet Oral BID   sodium chloride flush  3 mL Intravenous Q12H   spironolactone  12.5 mg Oral Daily   Continuous Infusions:  sodium chloride      Principal Problem:   Acute on chronic systolic CHF (congestive heart failure)  (HCC) Active Problems:   Coronary artery disease involving native coronary artery of native heart with angina pectoris (HCC)   Complete heart block ([place]) s/p Pacemaker in 2014   Atrial fibrillation and flutter (HCC)   CKD (chronic kidney disease), stage III ([place])   LOS: 3 days   A & P  Acute on chronic systolic congestive heart failure Patient presenting from home with progressive shortness of breath and lower extremity edema.  BNP elevated to 784 with chest x-ray notable for cardiomegaly with vascular congestion and pulmonary edema.  Previous echo from June 2020 with EF 20%, diffuse HK, moderate LAE, mild-mod TR, and at least moderate AS. Fluid balance negative by 659.8. Lasix reduced to 20 mg bid due to hypotension and AKI. Coreg also reduced by 1/2. Consider cardiology/palliative care consult.  Hypotension: parameters written for lasix, metoprolol, and entresto, however, unbeknownst to me the nurse is not able to view these parameters. I have now made her aware. The patient has been given a 500 cc bolus. Monitor.   CAD: Continue home doses of plavix, statin and beta blocker ( according to parameters)  Paroxysmal atrial fibrillation: Heart rate is controlled on beta blocker. Xarelto for CVA prophylaxis for CHADS-VASc of 4. Currently in normal sinus rhythm on telemetry.  Complete heart block: Pacemaker placed in 2014, denies lightheadedness, palpitations, or syncope. His electrophysiologist is planning for upgrade at some point. Monitor on telemetry.  DVT prophylaxis: Xarelto Code Status: Full code Family Communication: None Disposition Plan: Continue inpatient hospitalization, PT/OT consult pending, further dependent on clinical course  Ricardo Rosner, DO [facility] Direct contact: see [email or web address]  7PM-7AM contact night coverage as above 11/20/2018, 5:23 PM  LOS: 2 days

## 2018-11-20 NOTE — Progress Notes (Signed)
Pt's BP is now 89/57. MD made aware in regards to holding entresto this AM.

## 2018-11-21 ENCOUNTER — Inpatient Hospital Stay: Payer: Self-pay

## 2018-11-21 DIAGNOSIS — I5023 Acute on chronic systolic (congestive) heart failure: Secondary | ICD-10-CM

## 2018-11-21 LAB — BASIC METABOLIC PANEL
Anion gap: 8 (ref 5–15)
BUN: 29 mg/dL — ABNORMAL HIGH (ref 8–23)
CO2: 25 mmol/L (ref 22–32)
Calcium: 8.7 mg/dL — ABNORMAL LOW (ref 8.9–10.3)
Chloride: 103 mmol/L (ref 98–111)
Creatinine, Ser: 1.17 mg/dL (ref 0.61–1.24)
GFR calc Af Amer: >60 mL/min (ref >60)
GFR calc non Af Amer: 59 mL/min — ABNORMAL LOW (ref >60)
Glucose, Bld: 119 mg/dL — ABNORMAL HIGH (ref 70–99)
Potassium: 4.6 mmol/L (ref 3.5–5.1)
Sodium: 136 mmol/L (ref 135–145)

## 2018-11-21 MED ORDER — DOXYLAMINE SUCCINATE (SLEEP) 25 MG PO TABS
25.0000 mg | ORAL_TABLET | Freq: Once | ORAL | Status: AC
  Administered 2018-11-21: 25 mg via ORAL
  Filled 2018-11-21: qty 1

## 2018-11-21 MED ORDER — RIVAROXABAN 20 MG PO TABS
20.0000 mg | ORAL_TABLET | Freq: Every day | ORAL | Status: DC
  Administered 2018-11-21: 20 mg via ORAL
  Filled 2018-11-21: qty 1

## 2018-11-21 MED ORDER — FUROSEMIDE 10 MG/ML IJ SOLN
80.0000 mg | Freq: Once | INTRAMUSCULAR | Status: AC
  Administered 2018-11-21: 19:00:00 80 mg via INTRAVENOUS
  Filled 2018-11-21: qty 8

## 2018-11-21 MED ORDER — DIGOXIN 125 MCG PO TABS
0.1250 mg | ORAL_TABLET | Freq: Every day | ORAL | Status: DC
  Administered 2018-11-21 – 2018-11-27 (×7): 0.125 mg via ORAL
  Filled 2018-11-21 (×7): qty 1

## 2018-11-21 MED ORDER — METOLAZONE 5 MG PO TABS
2.5000 mg | ORAL_TABLET | Freq: Once | ORAL | Status: AC
  Administered 2018-11-21: 2.5 mg via ORAL
  Filled 2018-11-21: qty 1

## 2018-11-21 NOTE — Progress Notes (Signed)
Updated patients daughter, Lattie Haw, on patients plan of care.

## 2018-11-21 NOTE — Progress Notes (Signed)
   11/21/18 1819  Vitals  BP (!) 94/51  MAP (mmHg) 65  ECG Heart Rate 75  MEWS Score  MEWS RR 0  MEWS Pulse 0  MEWS Systolic 1  MEWS LOC 0  MEWS Temp 0  MEWS Score 1  MEWS Score Color Green   Paged MD Bensimhon to okay giving new medication orders with lower blood pressure. Medication to give are IV lasix, digoxin, and metolazone. Verbal okay to proceed with medication as ordered.   Saddie Benders RN BSN

## 2018-11-21 NOTE — Consult Note (Addendum)
Cardiology Consultation:   Patient ID: Ricardo Hunt MRN: [id number]; DOB: [birth year]  Admit date: 11/16/2018 Date of Consult: 11/21/2018  Primary Care Provider: Dione Housekeeper, MD Primary Cardiologist: Kate Sable, MD  Primary Electrophysiologist:  Thompson Grayer, MD  HF MD: Aundra Dubin  Patient Profile:   Ricardo Hunt is a 78 y.o. male with a hx of CAD status post remote CABG in [place] to LCx & atherectomy and stenting of left main/ostial LAD February 2020, chronic combined systolic and diastolic heart failure, complete heart block status post pacemaker in 2014, atrial fibrillation/flutter, hypertension, and  hyperlipidemia who is being seen today for the evaluation of CHF  at the request of Dr. Benny Lennert.   He underwent PCI with drug-eluting stent placement to the left circumflex as well as orbital atherectomy and drug-eluting stent placement to the LAD on 05/23/2018.  He was treated with dual antiplatelet therapy with aspirin and Plavix in addition to rivaroxaban for 1 month. He was evaluated by the advanced heart failure team during admission.  Echocardiogram showed LVEF 20 to 25% with severe left ventricular dilatation and diffuse hypokinesis and mild to moderate aortic stenosis. Cardiomyopathy was presumed to be ischemic but chronic RV pacing could have been playing a role. Heart failure regimen was adjusted.   Patient had mild volume overload 09/17/2018 when evaluated by [name], PA-C.   Follow-up echocardiogram 09/23/2018 showed persistently low LV function at 20%, wall motion abnormality, reduced right ventricular function, moderate low output low gradient aortic stenosis.  Patient was seen by Dr. [name] October 31, 2018 for persistently low EF despite guideline directed therapy for heart failure.  Recommended upgrade to CRT-P vs CRT-D for next available slot.  History of Present Illness:   [name] presented 8/17 with progressive worsening shortness of breath mostly with exertion.   He reports feeling well for month or 2 after his last PCI and then started to have a progressive dyspnea with exertion.  He needed to stop doing what he is doing.  No exertional chest pain or pressure.  He might have intermittent orthopnea without PND.  Intermittent lower extremity edema. Reports compliance with all medication except intermittently missing diuretics.  He was eating higher sodium diet.  His symptoms were different from prior angina.  BNP noted elevated at 784.  He was given IV diuretics but kidney function worsened and become hypotensive.  His Lasix is reduced with normalization of kidney function.  His heart failure medication also reduced for low blood pressure with minimal improvement.  He is on Xarelto 15 mg daily for anticoagulation.  Patient reports no significant improvement on his symptoms.  His dyspnea worsens with walking in hallway.  Able to lay flat.  BUN/Scr of 29/1.17 today. HS-Troponin 24>>27. COVID negative.    Heart Pathway Score:     Past Medical History:  Diagnosis Date   Arthritis    " all over the body"   Atrial fibrillation and flutter ([place])    seen on PPM interrogation; Rivaroxaban started 09/261   Chronic systolic CHF (congestive heart failure) ([place])    Echo 05/2018: EF 20-25, severe LVE, diffuse HK with septal and apical akinesis, moderate LAE, moderate MAC, severe aortic valve calcification - degree of AS diff to judge with low EF (likely mild to mod)   Coronary artery disease    s/p CABG 1996 // LHC 05/2018: oLAD 90, pLAD 100 after Dx, LCx 90 at bifurcation of OM1/OM2, mRCA 50; L-LAD ok; S-D1/OM2 100 >> PCI: DES to  mLCx and orbital atherectomy and DES to oLAD   GERD (gastroesophageal reflux disease)    Hyperlipidemia    Hypertension    Mobitz (type) II atrioventricular block    s/p STJ Accent pacemaker implanted by Dr [name] 10-16-2012   Myocardial infarction [facility])    per pt., told that that the stress test shows that there is a weakening  evident on the stress test   Pacemaker 10/16/2012   St. Jude    Personal history of colonic polyps-tubular adenomas 08/31/2013   Vertigo     Past Surgical History:  Procedure Laterality Date   cardiac bypass  1996   3 vessels   CATARACT EXTRACTION W/ INTRAOCULAR LENS  IMPLANT, BILATERAL Bilateral 2010   COLONOSCOPY     CORONARY ARTERY BYPASS GRAFT     CORONARY ATHERECTOMY N/A 05/23/2018   Procedure: CORONARY ATHERECTOMY;  Surgeon: [name], Peter M, MD;  Location: [place] [facility];  Service: Cardiovascular;  Laterality: N/A;   CORONARY STENT INTERVENTION N/A 05/23/2018   Procedure: CORONARY STENT INTERVENTION;  Surgeon: [name], Peter M, MD;  Location: [place] [facility];  Service: Cardiovascular;  Laterality: N/A;   INGUINAL HERNIA REPAIR Left 2010   LUMBAR LAMINECTOMY/DECOMPRESSION MICRODISCECTOMY Right 07/19/2016   Procedure: Right Lumbar Four-Five Hemilaminectomy and Bilateral Lumbar Two-Three Laminectomy;  Surgeon: Eustace Moore, MD;  Location: [facility] [facility];  Service: Neurosurgery;  Laterality: Right;  Right Lumbar Four-Five Hemilaminectomy and Bilateral Lumbar Two-Three Laminectomy   LUMBAR SPINE SURGERY  2006   NASAL SEPTUM SURGERY  09/01/13   Dr. Adriana Reams   PACEMAKER INSERTION  10-16-2012   STJ Accent dual chamber pacemaker implanted by Dr [name] for symptomatic Mobitz II heart block   PERMANENT PACEMAKER INSERTION N/A 10/16/2012   Procedure: PERMANENT PACEMAKER INSERTION;  Surgeon: [place] Grayer, MD;  Location: [facility];  Service: Cardiovascular;  Laterality: N/A;   RIGHT/LEFT HEART CATH AND CORONARY/GRAFT ANGIOGRAPHY N/A 05/20/2018   Procedure: RIGHT/LEFT HEART CATH AND CORONARY/GRAFT ANGIOGRAPHY;  Surgeon: [name], Peter M, MD;  Location: [place] [facility];  Service: Cardiovascular;  Laterality: N/A;     Inpatient Medications: Scheduled Meds:  carvedilol  6.25 mg Oral BID WC   clopidogrel  75 mg Oral Daily   furosemide  20 mg Oral BID   rivaroxaban  15 mg Oral  Q supper   rosuvastatin  20 mg Oral q1800   sacubitril-valsartan  1 tablet Oral BID   sodium chloride flush  3 mL Intravenous Q12H   spironolactone  12.5 mg Oral Daily   Continuous Infusions:  sodium chloride     PRN Meds: sodium chloride, acetaminophen, ondansetron (ZOFRAN) IV, sodium chloride flush  Allergies:   No Known Allergies  Social History:   Social History   Socioeconomic History   Marital status: Widowed    Spouse name: Not on file   Number of children: Not on file   Years of education: Not on file   Highest education level: Not on file  Occupational History   Not on file  Social Needs   Financial resource strain: Not on file   Food insecurity    Worry: Not on file    Inability: Not on file   Transportation needs    Medical: Not on file    Non-medical: Not on file  Tobacco Use   Smoking status: Former Smoker    Packs/day: 2.00    Years: 40.00    Pack years: 80.00    Types: Cigarettes    Quit date:  04/02/1994    Years since quitting: 24.6   Smokeless tobacco: Never Used  Substance and Sexual Activity   Alcohol use: Not Currently    Alcohol/week: 0.0 standard drinks   Drug use: No   Sexual activity: Not Currently  Lifestyle   Physical activity    Days per week: Not on file    Minutes per session: Not on file   Stress: Not on file  Relationships   Social connections    Talks on phone: Not on file    Gets together: Not on file    Attends religious service: Not on file    Active member of club or organization: Not on file    Attends meetings of clubs or organizations: Not on file    Relationship status: Not on file   Intimate partner violence    Fear of current or ex partner: Not on file    Emotionally abused: Not on file    Physically abused: Not on file    Forced sexual activity: Not on file  Other Topics Concern   Not on file  Social History Narrative   Not on file    Family History:   Family History  Problem  Relation Age of Onset   CAD Father    Hypertension Father    CAD Mother    Diabetes Mother    Breast cancer Other        Niece   Colon cancer Neg Hx    Pancreatic cancer Neg Hx    Stomach cancer Neg Hx      ROS:  Please see the history of present illness.  All other ROS reviewed and negative.     Physical Exam/Data:   Vitals:   11/21/18 0110 11/21/18 0359 11/21/18 0919 11/21/18 1110  BP: 92/60 107/71 96/61 102/62  Pulse: (!) 57 70 78 61  Resp:  20  18  Temp:  98 F (36.7 C)  98.3 F (36.8 C)  TempSrc:  Oral  Oral  SpO2:  99% 96% 95%  Weight:  81.3 kg    Height:        Intake/Output Summary (Last 24 hours) at 11/21/2018 1206 Last data filed at 11/21/2018 0840 Gross per 24 hour  Intake 1563.02 ml  Output 1025 ml  Net 538.02 ml   Last 3 Weights 11/21/2018 11/20/2018 11/19/2018  Weight (lbs) 179 lb 4.8 oz 178 lb 4.8 oz 176 lb 3.2 oz  Weight (kg) 81.33 kg 80.876 kg 79.924 kg     Body mass index is 27.26 kg/m.  General:  Well nourished, well developed, in no acute distress HEENT: normal Lymph: no adenopathy Neck: no JVD Endocrine:  No thryomegaly Vascular: No carotid bruits; FA pulses 2+ bilaterally without bruits  Cardiac:  normal S1, S2; RRR; Systolic murmur Lungs:  clear to auscultation bilaterally, no wheezing, rhonchi or rales  Abd: soft, nontender, no hepatomegaly  Ext: trace edema  Musculoskeletal:  No deformities, BUE and BLE strength normal and equal Skin: warm and dry  Neuro:  CNs 2-12 intact, no focal abnormalities noted Psych:  Normal affect   EKG:  The EKG was personally reviewed and demonstrates: Sinus rhythm at rate of 75 bpm, PVC Telemetry:  Telemetry was personally reviewed and demonstrates:  AV paced rhythm   Relevant CV Studies:  Echo 09/23/2018   1. Stage 1: 1: Basal and mid inferolateral wall, mid anterolateral segment, and mid inferior segment are abnormal.  2. The left ventricle has a visually estimated ejection fraction  of 20%.  The cavity size was moderate to severely dilated. Mild basal septal hypertrophy. Left ventricular diastolic Doppler parameters are consistent with restrictive filling. Elevated  left ventricular end-diastolic pressure Left ventricular diffuse hypokinesis.  3. The right ventricle has moderately reduced systolic function. The cavity was mildly enlarged. There is no increase in right ventricular wall thickness.  4. Left atrial size was moderately dilated.  5. Right atrial size was mildly dilated.  6. Mildly thickened tricuspid valve leaflets.  7. The mitral valve is degenerative. Mild thickening of the mitral valve leaflet. Mild calcification of the mitral valve leaflet. There is moderate mitral annular calcification present.  8. The tricuspid valve is grossly normal. Tricuspid valve regurgitation is mild-moderate.  9. The aortic valve is tricuspid. Moderate thickening of the aortic valve. Moderate calcification of the aortic valve. Low output, low gradient calcific aortic stenosis. Degree of stenosis is at least moderate of the aortic valve. 10. Moderate aortic annular calcification. 11. There is mild dilatation of the aortic root measuring 40 mm. 12. The inferior vena cava was dilated in size with >50% respiratory variability.  Percutaneous coronary intervention 05/23/2018 1. Successful PCI of the proximal to mid LCx with DES x 1 (2.5 x 15 mm Resolute Onyx DES) 2. Successful PCI of the ostial LAD with orbital atherectomy and DES x 1 (2.5 x 26 Resolute Onyx DES).  Post PCI:   Right and left heart catheterization 05/20/2018 1. Severe 2 vessel obstructive CAD.  - 90% ostial LAD supplying a large first diagonal - 100% LAD after the first diagonal. - 90% LCx at bifurcation of OM1 and OM2 - 50% mid RCA 2. Patent LIMA to the LAD 3. Occluded sequential SVG to the first diagonal and OM2 4. Severe LV dysfunction, EF <25 5. Mild pulmonary venous HTN 6. Elevated LV filling pressures 7.  Cardiac index 2.34 Plan: will admit to telemetry. Consult advanced heart failure team to optimize CHF therapy. Will load with Plavix. Anticipate complex PCI later this week including stenting of the LCx and atherectomy and stenting of the left main/ostial LAD.      Laboratory Data:  High Sensitivity Troponin:   Recent Labs  Lab 11/16/18 2135 11/16/18 2339  TROPONINIHS 24* 27*      Chemistry Recent Labs  Lab 11/19/18 0513 11/20/18 0645 11/21/18 0431  NA 138 138 136  K 4.3 4.4 4.6  CL 101 103 103  CO2 _0 GLUCOSE 134* 132* 119*  BUN 42* 37* 29*  CREATININE 1.95* 1.42* 1.17  CALCIUM 9.0 9.1 8.7*  GFRNONAA 32* 46* 59*  GFRAA 37* 54* >60  ANIONGAP _1 No results for input(s): PROT, ALBUMIN, AST, ALT, ALKPHOS, BILITOT in the last 168 hours. Hematology Recent Labs  Lab 11/16/18 2135  WBC 10.4  RBC 4.92  HGB 15.9  HCT 48.2  MCV 98.0  MCH 32.3  MCHC 33.0  RDW 15.0  PLT 216   BNP Recent Labs  Lab 11/16/18 2135  BNP 783.8*     Radiology/Studies:  No results found.  Assessment and Plan:   1. Acute on chronic combined systolic heart failure BNP 783 on admit.  He was given IV Lasix 40 mg x 4 on 8/17 (admitted) leading to worsening renal function.  Since then he got Lasix 20 mg daily by mouth.  Renal function has been normalized. He was over diuresed.  -He appears euvolemic by exam -Heart failure medication adjusted due to hypotension -Currently on Coreg 6.25  mg twice daily (not getting), Entresto 24/26 twice daily (not getting) and Spironolactone 12.5 mg daily  2.  Ischemic cardiomyopathy -  echocardiogram 09/23/2018 showed persistently low LV function at 20%, wall motion abnormality, reduced right ventricular function, moderate low output low gradient aortic stenosis. -Plan for ICD upgrade per Dr. [name] - Will review medications adjustment with Dr. Haroldine Laws  3.  Moderate aortic stenosis -Patient reports dyspnea on exertion rather than dyspnea  with laying down -Suspicious worsening valvular disease  4.  CAD -He denies exertional chest pressure/tightness.  Symptoms not similar to prior angina. -Continue Plavix and statin.  5.  Paroxysmal atrial fibrillation/flutter -He was in sinus rhythm on admission. -Currently AV paced rhythm -Continue Xarelto for anticoagulation.  No bleeding issue.  Rate controlled.  6.  Complete heart block status post pacemaker -Plan to upgrade to ICD per Dr. [name]  7.  Hypertension -Medication adjusted due to soft blood pressure this admission  8.  AKI -Resolved with reducing diuretics and hydration    For questions or updates, please contact [facility] Please consult [email or web address] for contact info under    Jarrett Soho, PA  11/21/2018 12:06 PM   Patient seen and examined with the above-signed Advanced Practice Provider and/or Housestaff. I personally reviewed laboratory data, imaging studies and relevant notes. I independently examined the patient and formulated the important aspects of the plan. I have edited the note to reflect any of my changes or salient points. I have personally discussed the plan with the patient and/or family.  78 y/o male with h/o CAD s/p CABG, PAF, probable low-gradient severe AS and severe systolic HF due to probable mixed/ischemic CM with EF 20% by echo. Admitted in 2/20 with HF. Underwent cath which showed severe 2v CAD with successful PCI of the proximal to mid LCx with DES x 1& successful PCI of the ostial LAD with orbital atherectomy and DES x 1. RHC at that time with RA 7, PA 43/20 (33), PCW 22 Fick 4.6/2.3.  Echo in 6/20 SHOWED EF 20% with moderate to severe RV dysfunction and probable low gradient AS (dimmensionless index not measured)  Since discharge has not felt great. NYHA III. Saw Dr. [name] and plan was for upgrade to CRT-P or -D to avoid chronic RV pacing and hoepfully improve EF. However was admitted this week with worsening HF and  NYHA IV symptoms.   On admit BNP 783 (390) and CXR suggestive of pulmonary edema. Failed attempts at diuresis with AKI upt to 1.95. Diuretics held and creatinine back to 1.17  ReDS Vest today 43%. Remains symptomatic at rest.   On exam  General:  Mildly dyspneic HEENT: normal Neck: supple. JVP to jaw. Carotids 2+ bilat; no bruits. No lymphadenopathy or thryomegaly appreciated. Cor: PMI nondisplaced. Regular rate & rhythm. 2/6 AS. Lungs: + basilar crackles  Abdomen: soft, nontender, mildly distended. No hepatosplenomegaly. No bruits or masses. Good bowel sounds. Extremities: no cyanosis, clubbing, rash,1+ edema Neuro: alert & orientedx3, cranial nerves grossly intact. moves all 4 extremities w/o difficulty. Affect pleasant  He is volume overloaded on exam and by ReDS in setting of severe biventricular dysfunction.. Given lack of response to IV diuresis, I worry about low output due to severe LV dysfunction and probable severe low gradient AS. I will challenge with another dose of IV lasix. Place PICC to check co-ox and CVP. Will likely need inotropes (would use dobutamine with AS). Will need to consider structural heart eval and possible dobutamine challenge in cath lab.  I also worry that chronic RV pacing has been detrimental but not sure an upgrade to CRT-P or -D will be much help until he is more tuned up and there issues addressed. CAD status is stable. Doubt ischemia is playing a major role here. Stop carvedilol. Add digoxin.   Glori Bickers, MD  6:13 PM

## 2018-11-21 NOTE — Progress Notes (Signed)
Phone call received from IV team's PICC nurse.  States he is unable to place PICC tonight, and it will be placed in the morning.  [name]

## 2018-11-21 NOTE — Plan of Care (Signed)

## 2018-11-21 NOTE — Progress Notes (Signed)
ReDS Vest - 11/21/18 1200      ReDS Vest   Fitting Posture  Sitting    Height Marker  --   Station C   Ruler Value  [place]  Aligned    ReDS Value  43

## 2018-11-21 NOTE — Progress Notes (Signed)
Spoke with RN from IV team regarding PICC placement.  She states that PICC lines cannot be placed after 10pm.  If the PICC nurse doesn't arrive to the floor by 10pm, the PICC will be placed by the day shift PICC nurse in the morning.  [name]

## 2018-11-21 NOTE — Progress Notes (Signed)
PROGRESS NOTE  Rollyn KAMALI MENG [id number] DOB: [birth year] DOA: 11/16/2018 PCP: Dione Housekeeper, MD  Brief History   Ricardo Hunt a 78 y.o.malewith medical history significant forcoronary artery disease status postremoteCABGand then 2 stents in February 2020, atrial fibrillation on Xarelto, complete heart block with pacer, and ischemic cardiomyopathy with EF 20% in June, now presenting to the emergency department for insidiously worsening dyspnea. Patient reports that he has had some shortness of breath for the past 3 months or so, but has worsened significantly over the past couple days.He reports some bilateral ankle swelling, but does not feel that this has changed much recently. He denied chest pain, fevers, chills, or cough.Patient believes his weight has been fairly stable. He reports eating a lot of salty foods, but states that he always has and there has not been any increase in salt or fluid intake. States that there has not been any medication changes since February and he has continued to be adherent tohis regimen.  ED Course:Upon arrival to the ED, patient is found to be afebrile, saturating mid 90s on room air, slightly tachypneic, and with stable blood pressure. EKG features a sinus rhythm with PVCs, fusion complexes, and LBBB. Chest x-ray is notable for cardiomegaly and vascular congestion with concern for early pulmonary edema. Chemistry panel is notable for a creatinine 1.37, similar to priors. CBC is unremarkable. Troponin is mildly elevated and BNP is elevated to 784 which is higher than priors. Patient was given 40 mg IV Lasix in the emergency department a couple hours ago, but has not urinated much and remains dyspneic at rest, only able to ambulate a few steps without beginning to develop distress, and he will be observed for ongoing evaluation and management.  The patient was admitted to a telemetry bed. He was diuresed, but has been de-escalated to oral  lasix 40 mg bid due to a bump in creatinine and hypotension. Beta blocker and lasix doses have been held due to hypotension as has entresto. Heart failure team has been consulted. Monitor.   Consultants  . None  Procedures  . None  Antibiotics   Anti-infectives (From admission, onward)   None     Subjective  The patient is resting comfortably. Blood pressures continue to limit.  Objective   Vitals:  Vitals:   11/21/18 1110 11/21/18 1514  BP: 102/62 109/75  Pulse: 61 73  Resp: 18 18  Temp: 98.3 F (36.8 C) 97.9 F (36.6 C)  SpO2: 95% 96%    Exam:  Constitutional:  . The patient is awake, alert, and oriented x 3. No acute distress. Respiratory:  . No increased work of breathing. . No wheezes, rales, or rhonchi . No tactile fremitus. Cardiovascular:  . Regular rate and rhythm. . No murmurs, ectopy, or gallups.  . No lateral PMI. No thrills. Abdomen:  . Abdomen is soft, non-tender, non-distended. . No hernias, masses, or hernias are appreciated. . Normoactive bowel sounds. Musculoskeletal:  . No cyanosis, clubbing, or edema. Skin:  . No rashes, lesions, ulcers . palpation of skin: no induration or nodules Neurologic:  . CN 2-12 intact . Sensation all 4 extremities intact Psychiatric:  . Mental status o Mood, affect appropriate o Orientation to person, place, time  . judgment and insight appear intact   I have personally reviewed the following:   Today's Data  . Vitals, BMP  Cardiology Data  . Echo 09/2018 EF 20% with global hypokinesis.  Scheduled Meds: . carvedilol  6.25 mg Oral BID WC  .  clopidogrel  75 mg Oral Daily  . furosemide  20 mg Oral BID  . rivaroxaban  20 mg Oral Q supper  . rosuvastatin  20 mg Oral q1800  . sacubitril-valsartan  1 tablet Oral BID  . sodium chloride flush  3 mL Intravenous Q12H  . spironolactone  12.5 mg Oral Daily   Continuous Infusions: . sodium chloride      Principal Problem:   Acute on chronic systolic  CHF (congestive heart failure) (HCC) Active Problems:   Coronary artery disease involving native coronary artery of native heart with angina pectoris (HCC)   Complete heart block (HCC) s/p Pacemaker in 2014   Atrial fibrillation and flutter ([facility])   CKD (chronic kidney disease), stage III ([place])   LOS: 4 days   A & P  Acute on chronic systolic congestive heart failure Patient presenting from home with progressive shortness of breath and lower extremity edema.  BNP elevated to 784 with chest x-ray notable for cardiomegaly with vascular congestion and pulmonary edema.  Previous echo from June 2020 with EF 20%, diffuse HK, moderate LAE, mild-mod TR, and at least moderate AS. Fluid balance negative by 659.8. Coreg, Lasix, and entresto held due to hypotension. Advanced heart failure team consulted.  Hypotension: Likely due to low CO. Entresto, lasix, and beta blocker held.    CAD: Continue home doses of plavix, statin and beta blocker (held according to parameters).  Paroxysmal atrial fibrillation: Heart rate is controlled on beta blocker. Xarelto for CVA prophylaxis for CHADS-VASc of 4. Currently in normal sinus rhythm on telemetry.  Complete heart block: Pacemaker placed in 2014, denies lightheadedness, palpitations, or syncope. His electrophysiologist is planning for upgrade at some point. Monitor on telemetry.  I have seen and examined this patient myself. I have spent 35 minutes in his evaluation and care.  DVT prophylaxis: Xarelto Code Status: Full code Family Communication: None Disposition Plan: Continue inpatient hospitalization, PT/OT consult pending, further dependent on clinical course  Kadeisha Betsch, DO [facility] Direct contact: see [email or web address]  7PM-7AM contact night coverage as above 11/21/2018, 3:34 PM  LOS: 2 days

## 2018-11-21 NOTE — Progress Notes (Signed)
Physical Therapy Treatment Patient Details Name: Ricardo Hunt MRN: [id number] DOB: 14-Aug-1940 Today's Date: 11/21/2018    History of Present Illness 78 y.o. male with medical history significant for vertigo, coronary artery disease status post remote CABG and then 2 stents in February 2020, atrial fibrillation on Xarelto, complete heart block with pacer, and ischemic cardiomyopathy with EF 20% in June, now presenting to the emergency department for insidiously worsening dyspnea. BNP is elevated to 784, chest x-ray notable for cardiomegaly with vascular congestion and pulmonary edema. acute on chronic CHF    PT Comments    Patient progressing well towards PT goals. Improved ambulation distance with Min guard for balance/safety. Mainly limited by DOE, endurance and left knee pain. Sp02 dropped to 87% on RA, recovered quickly with rest break and cues for pursed lip breathing. No reports of dizziness today. Mild imbalance noted today- pt reports his knee has not been bothering him for some time. Encouraged OOB to chair and increasing activity as tolerated to improve ventilation. Will follow.    Follow Up Recommendations  Outpatient PT(vestibular PT)     Equipment Recommendations  None recommended by PT    Recommendations for Other Services       Precautions / Restrictions Precautions Precautions: Fall Precaution Comments: denies h/o falls; reports things spin when he first stands up sometimes and he just sits back down until it passes Restrictions Weight Bearing Restrictions: No    Mobility  Bed Mobility Overal bed mobility: Modified Independent             General bed mobility comments: HOB elevated.  Transfers Overall transfer level: Needs assistance Equipment used: None Transfers: Sit to/from Stand Sit to Stand: Supervision         General transfer comment: for safety; denied vertigo at this time. Holding onto rail on 1 instance. When attempting to turn to sit in  chair, pt with LOB posteriorly onto bed. Transferred to chair post ambulation.  Ambulation/Gait Ambulation/Gait assistance: Min guard Gait Distance (Feet): 75 Feet Assistive device: None Gait Pattern/deviations: Step-through pattern;Decreased stride length;Drifts right/left Gait velocity: decr   General Gait Details: Slow, mildly unsteady gait with drifting noted and stumbling due to left knee stiffness/pain. No overt LOB. 2-3/4 DOE. Sp02 dropped to 87% on RA, recovered quickly with rest breaks.   Stairs             Wheelchair Mobility    Modified Rankin (Stroke Patients Only)       Balance Overall balance assessment: Needs assistance Sitting-balance support: Feet supported;No upper extremity supported Sitting balance-Leahy Scale: Good Sitting balance - Comments: Able to reach outside BoS and donn socks without difficulty or LOB.   Standing balance support: During functional activity Standing balance-Leahy Scale: Good                              Cognition Arousal/Alertness: Awake/alert Behavior During Therapy: WFL for tasks assessed/performed Overall Cognitive Status: Within Functional Limits for tasks assessed                                 General Comments: for basic mobility tasks      Exercises      General Comments        Pertinent Vitals/Pain Pain Assessment: Faces Faces Pain Scale: Hurts little more Pain Location: left knee Pain Descriptors / Indicators: Sore;Aching Pain Intervention(s): Repositioned;Monitored  during session;Limited activity within patient's tolerance    Home Living                      Prior Function            PT Goals (current goals can now be found in the care plan section) Progress towards PT goals: Progressing toward goals    Frequency    Min 3X/week      PT Plan Current plan remains appropriate    Co-evaluation              AM-PAC PT "6 Clicks" Mobility    Outcome Measure  Help needed turning from your back to your side while in a flat bed without using bedrails?: None Help needed moving from lying on your back to sitting on the side of a flat bed without using bedrails?: None Help needed moving to and from a bed to a chair (including a wheelchair)?: None Help needed standing up from a chair using your arms (e.g., wheelchair or bedside chair)?: None Help needed to walk in hospital room?: A Little Help needed climbing 3-5 steps with a railing? : A Little 6 Click Score: 22    End of Session Equipment Utilized During Treatment: Gait belt Activity Tolerance: Patient tolerated treatment well;Patient limited by pain;Other (comment)(SOB) Patient left: in chair;with call bell/phone within reach Nurse Communication: Mobility status PT Visit Diagnosis: Unsteadiness on feet (R26.81)     Time: [id number] PT Time Calculation (min) (ACUTE ONLY): 14 min  Charges:  $Therapeutic Activity: 8-22 mins                     Wray Kearns, PT, DPT Acute Rehabilitation Services Pager [phone] Office [phone]       [place] 11/21/2018, 8:16 AM

## 2018-11-21 NOTE — Progress Notes (Signed)
Pt will be transferred to 6E08 to monitor CVP. Report was given to RN.

## 2018-11-21 NOTE — Progress Notes (Signed)
Pt has been routinely completing ADL and ambulating around his room and to bathroom independently. Reinforced energy conservation strategies. No further OT needs.   11/21/18 1000  OT Visit Information  Last OT Received On 11/21/18  Assistance Needed +1  History of Present Illness 78 y.o. male with medical history significant for vertigo, coronary artery disease status post remote CABG and then 2 stents in February 2020, atrial fibrillation on Xarelto, complete heart block with pacer, and ischemic cardiomyopathy with EF 20% in June, now presenting to the emergency department for insidiously worsening dyspnea. BNP is elevated to 784, chest x-ray notable for cardiomegaly with vascular congestion and pulmonary edema. acute on chronic CHF  Precautions  Precaution Comments h/o vertigo, sits at EOB before standing  Pain Assessment  Pain Assessment Faces  Faces Pain Scale 4  Pain Location left knee  Pain Descriptors / Indicators Sore;Aching  Pain Intervention(s) Monitored during session  Cognition  Arousal/Alertness Awake/alert  Behavior During Therapy WFL for tasks assessed/performed  Overall Cognitive Status Within Functional Limits for tasks assessed  ADL  Overall ADL's  Modified independent  General ADL Comments Pt has been routinely taking himself to the bathroom and performing ADL. Demonstrated ability to complete LB dressing, toileting and oral care standing at sink. Reinforced energy conservation strategies  Bed Mobility  Overal bed mobility Modified Independent  General bed mobility comments HOB flat  Balance  Sitting balance-Leahy Scale Good  Sitting balance - Comments no LOB with LB dressing  Standing balance-Leahy Scale Good  Transfers  Overall transfer level Modified independent  Equipment used None  OT - End of Session  Activity Tolerance Patient tolerated treatment well  Patient left in bed;with call bell/phone within reach  OT Assessment/Plan  OT Plan All goals met and  education completed, patient discharged from OT services  OT Visit Diagnosis Other (comment);Pain;Other abnormalities of gait and mobility (R26.89) (decreased activity tolerance)  Follow Up Recommendations No OT follow up  OT Equipment None recommended by OT  AM-PAC OT "6 Clicks" Daily Activity Outcome Measure (Version 2)  Help from another person eating meals? 4  Help from another person taking care of personal grooming? 4  Help from another person toileting, which includes using toliet, bedpan, or urinal? 4  Help from another person bathing (including washing, rinsing, drying)? 4  Help from another person to put on and taking off regular upper body clothing? 4  Help from another person to put on and taking off regular lower body clothing? 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

## 2018-11-21 NOTE — Progress Notes (Signed)
Noted order for PICC.  Plan to place in am

## 2018-11-22 LAB — BASIC METABOLIC PANEL
Anion gap: 11 (ref 5–15)
BUN: 29 mg/dL — ABNORMAL HIGH (ref 8–23)
CO2: 29 mmol/L (ref 22–32)
Calcium: 9.4 mg/dL (ref 8.9–10.3)
Chloride: 97 mmol/L — ABNORMAL LOW (ref 98–111)
Creatinine, Ser: 1.49 mg/dL — ABNORMAL HIGH (ref 0.61–1.24)
GFR calc Af Amer: 51 mL/min — ABNORMAL LOW (ref >60)
GFR calc non Af Amer: 44 mL/min — ABNORMAL LOW (ref >60)
Glucose, Bld: 137 mg/dL — ABNORMAL HIGH (ref 70–99)
Potassium: 4.2 mmol/L (ref 3.5–5.1)
Sodium: 137 mmol/L (ref 135–145)

## 2018-11-22 LAB — COOXEMETRY PANEL
Carboxyhemoglobin: 2.1 % — ABNORMAL HIGH (ref 0.5–1.5)
Methemoglobin: 0.9 % (ref 0.0–1.5)
O2 Saturation: 70.9 %
Total hemoglobin: 15.8 g/dL (ref 12.0–16.0)

## 2018-11-22 LAB — URIC ACID: Uric Acid, Serum: 9 mg/dL — ABNORMAL HIGH (ref 3.7–8.6)

## 2018-11-22 MED ORDER — PREDNISONE 20 MG PO TABS
40.0000 mg | ORAL_TABLET | Freq: Every day | ORAL | Status: AC
  Administered 2018-11-22 – 2018-11-24 (×3): 40 mg via ORAL
  Filled 2018-11-22 (×3): qty 2

## 2018-11-22 MED ORDER — RIVAROXABAN 15 MG PO TABS
15.0000 mg | ORAL_TABLET | Freq: Every day | ORAL | Status: DC
  Administered 2018-11-22 – 2018-11-23 (×2): 15 mg via ORAL
  Filled 2018-11-22 (×2): qty 1

## 2018-11-22 MED ORDER — SODIUM CHLORIDE 0.9% FLUSH
10.0000 mL | Freq: Two times a day (BID) | INTRAVENOUS | Status: DC
  Administered 2018-11-22 – 2018-11-25 (×4): 10 mL

## 2018-11-22 MED ORDER — SODIUM CHLORIDE 0.9% FLUSH: 10.0000 mL | INTRAVENOUS | Status: DC | PRN

## 2018-11-22 NOTE — Plan of Care (Signed)
  Problem: Clinical Measurements: Goal: Respiratory complications will improve Outcome: Progressing Note: No s/s of respiratory complications noted.  Stable on room air. Goal: Cardiovascular complication will be avoided Outcome: Progressing Note: No s/s of cardiovascular complications noted.  AV paced on telemetry.  VSS.

## 2018-11-22 NOTE — Progress Notes (Signed)
PROGRESS NOTE  Ricardo Hunt [id number] DOB: November 13, 1940 DOA: 11/16/2018 PCP: Dione Housekeeper, MD  Brief History   Ricardo Hunt a 78 y.o.malewith medical history significant forcoronary artery disease status postremoteCABGand then 2 stents in February 2020, atrial fibrillation on Xarelto, complete heart block with pacer, and ischemic cardiomyopathy with EF 20% in June, now presenting to the emergency department for insidiously worsening dyspnea. Patient reports that he has had some shortness of breath for the past 3 months or so, but has worsened significantly over the past couple days.He reports some bilateral ankle swelling, but does not feel that this has changed much recently. He denied chest pain, fevers, chills, or cough.Patient believes his weight has been fairly stable. He reports eating a lot of salty foods, but states that he always has and there has not been any increase in salt or fluid intake. States that there has not been any medication changes since February and he has continued to be adherent tohis regimen.  ED Course:Upon arrival to the ED, patient is found to be afebrile, saturating mid 90s on room air, slightly tachypneic, and with stable blood pressure. EKG features a sinus rhythm with PVCs, fusion complexes, and LBBB. Chest x-ray is notable for cardiomegaly and vascular congestion with concern for early pulmonary edema. Chemistry panel is notable for a creatinine 1.37, similar to priors. CBC is unremarkable. Troponin is mildly elevated and BNP is elevated to 784 which is higher than priors. Patient was given 40 mg IV Lasix in the emergency department a couple hours ago, but has not urinated much and remains dyspneic at rest, only able to ambulate a few steps without beginning to develop distress, and he will be observed for ongoing evaluation and management.  The patient was admitted to a telemetry bed. He was diuresed, but has been de-escalated to oral  lasix 40 mg bid due to a bump in creatinine and hypotension. Beta blocker and lasix doses have been held due to hypotension as has entresto. Heart failure team has been consulted. PICC has been placed and CVP is being monitored.  Consultants  . Heart Failure Team  Procedures  . None  Antibiotics   Anti-infectives (From admission, onward)   None     Subjective  The patient is resting comfortably. He states that he feels better.  Objective   Vitals:  Vitals:   11/22/18 1443 11/22/18 1624  BP: 96/63 95/68  Pulse: 72 (!) 109  Resp:  18  Temp: 98.9 F (37.2 C) 98.2 F (36.8 C)  SpO2: 96% 100%    Exam:  Constitutional:  . The patient is awake, alert, and oriented x 3. No acute distress. Respiratory:  . No increased work of breathing. . No wheezes, rales, or rhonchi . No tactile fremitus. Cardiovascular:  . Regular rate and rhythm. . No murmurs, ectopy, or gallups.  . No lateral PMI. No thrills. Abdomen:  . Abdomen is soft, non-tender, non-distended. . No hernias, masses, or hernias are appreciated. . Normoactive bowel sounds. Musculoskeletal:  . No cyanosis, clubbing, or edema. Skin:  . No rashes, lesions, ulcers . palpation of skin: no induration or nodules Neurologic:  . CN 2-12 intact . Sensation all 4 extremities intact Psychiatric:  . Mental status o Mood, affect appropriate o Orientation to person, place, time  . judgment and insight appear intact   I have personally reviewed the following:   Today's Data  . Vitals, BMP  Cardiology Data  . Echo 09/2018 EF 20% with global hypokinesis.  Scheduled Meds: . clopidogrel  75 mg Oral Daily  . digoxin  0.125 mg Oral Daily  . predniSONE  40 mg Oral Q breakfast  . rivaroxaban  15 mg Oral Q supper  . rosuvastatin  20 mg Oral q1800  . sacubitril-valsartan  1 tablet Oral BID  . sodium chloride flush  10-40 mL Intracatheter Q12H  . sodium chloride flush  3 mL Intravenous Q12H  . spironolactone  12.5 mg  Oral Daily   Continuous Infusions: . sodium chloride      Principal Problem:   Acute on chronic systolic CHF (congestive heart failure) (HCC) Active Problems:   Coronary artery disease involving native coronary artery of native heart with angina pectoris (HCC)   Complete heart block (HCC) s/p Pacemaker in 2014   Atrial fibrillation and flutter (HCC)   CKD (chronic kidney disease), stage III ([place])   LOS: 5 days   A & P  Acute on chronic systolic congestive heart failure Patient presenting from home with progressive shortness of breath and lower extremity edema.  BNP elevated to 784 with chest x-ray notable for cardiomegaly with vascular congestion and pulmonary edema.  Previous echo from June 2020 with EF 20%, diffuse HK, moderate LAE, mild-mod TR, and at least moderate AS. Fluid balance negative by 659.8. Coreg, Lasix, and entresto held due to hypotension. Advanced heart failure team consulted. The patient now has a PICC and his CVP is being monitored.  Hypotension: CVP is 3-4. Not orthostatic.  CAD: Continue home doses of plavix, statin and beta blocker (held according to parameters).  Paroxysmal atrial fibrillation: Heart rate is controlled on beta blocker. Xarelto for CVA prophylaxis for CHADS-VASc of 4. Currently in normal sinus rhythm on telemetry.  Complete heart block: Pacemaker placed in 2014, denies lightheadedness, palpitations, or syncope. His electrophysiologist is planning for upgrade at some point. Monitor on telemetry.  I have seen and examined this patient myself. I have spent 32 minutes in his evaluation and care.  DVT prophylaxis: Xarelto Code Status: Full code Family Communication: None Disposition Plan: Continue inpatient hospitalization, PT/OT consult pending, further dependent on clinical course  Ricardo Rask, DO [facility] Direct contact: see [email or web address]  7PM-7AM contact night coverage as above 11/22/2018, 5:02 PM  LOS: 2 days

## 2018-11-22 NOTE — Progress Notes (Signed)
ANTICOAGULATION NOTE  Pharmacy Consult for Xarelto Indication: atrial fibrillation  No Known Allergies  Patient Measurements: Height: 5\' 8"  (172.7 cm) Weight: 174 lb 11.2 oz (79.2 kg) IBW/kg (Calculated) : 68.4   Labs: Recent Labs    11/20/18 0645 11/21/18 0431 11/22/18 0228  CREATININE 1.42* 1.17 1.49*    Estimated Creatinine Clearance: 38.3 mL/min (A) (by C-G formula based on SCr of 1.49 mg/dL (H)).   Assessment: 78 yo male on chronic Xarelto for afib.  Scr improved a bit yesterday and dose was increase to 20 mg daily.  However, Scr back up today.  Goal of Therapy:  Monitor platelets by anticoagulation protocol: Yes   Plan:  Decrease Xarelto to 15 mg daily for now. Will watch renal function.  Marguerite Olea, [facility] Clinical Pharmacist Phone [phone]  11/22/2018 2:22 PM

## 2018-11-22 NOTE — Progress Notes (Signed)
Peripherally Inserted Central Catheter/Midline Placement  The IV Nurse has discussed with the patient and/or persons authorized to consent for the patient, the purpose of this procedure and the potential benefits and risks involved with this procedure.  The benefits include less needle sticks, lab draws from the catheter, and the patient may be discharged home with the catheter. Risks include, but not limited to, infection, bleeding, blood clot (thrombus formation), and puncture of an artery; nerve damage and irregular heartbeat and possibility to perform a PICC exchange if needed/ordered by physician.  Alternatives to this procedure were also discussed.  Bard Power PICC patient education guide, fact sheet on infection prevention and patient information card has been provided to patient /or left at bedside.    PICC/Midline Placement Documentation  PICC Double Lumen 11/22/18 PICC Right Cephalic 38 cm 0 cm (Active)  Indication for Insertion or Continuance of Line Chronic illness with exacerbations (CF, Sickle Cell, etc.) 11/22/18 0917  Exposed Catheter (cm) 0 cm 11/22/18 0917  Site Assessment Clean;Dry;Intact 11/22/18 0917  Lumen #1 Status Flushed;Saline locked;Blood return noted 11/22/18 0917  Lumen #2 Status Flushed;Saline locked;Blood return noted 11/22/18 0917  Dressing Type Transparent 11/22/18 0917  Dressing Status Clean;Dry;Intact;Antimicrobial disc in place 11/22/18 [place] checked and tightened 11/22/18 0917  Line Adjustment (NICU/IV Team Only) No 11/22/18 0917  Dressing Intervention New dressing 11/22/18 0917  Dressing Change Due 11/29/18 11/22/18 [place]       Rolena Infante 11/22/2018, 9:20 AM

## 2018-11-22 NOTE — Progress Notes (Signed)
Advanced Heart Failure Rounding Note   Subjective:    Admitted with recurrent HF. Creatinine bumped with diuresis but ReDS still 43%. I gave him another dose of IV lasix last night. Weight down 5 pounds but creatinine bumped again.   PICC placed this am. CVP 5 (after diuresis) Co-ox 71%   Says he feels a bit better. Breathing improved. Orthopnea resolved but still SOB with mild activity. No CP or dizziness. Left knee sore.   Objective:   Weight Range:  Vital Signs:   Temp:  [97.9 F (36.6 C)-99 F (37.2 C)] 97.9 F (36.6 C) (08/22 0810) Pulse Rate:  [60-141] 85 (08/22 0930) Resp:  [15-20] 18 (08/22 0810) BP: (94-111)/(51-80) 111/80 (08/22 0810) SpO2:  [95 %-99 %] 96 % (08/22 0810) Weight:  [79.2 kg] 79.2 kg (08/22 0701) Last BM Date: 11/20/18  Weight change: Filed Weights   11/20/18 0416 11/21/18 0359 11/22/18 0701  Weight: 80.9 kg 81.3 kg 79.2 kg    Intake/Output:   Intake/Output Summary (Last 24 hours) at 11/22/2018 1026 Last data filed at 11/22/2018 0931 Gross per 24 hour  Intake 490 ml  Output 1690 ml  Net -1200 ml     Physical Exam: General:  Lying in bed. No resp difficulty HEENT: normal Neck: supple. JVP 5-6 . Carotids 2+ bilat; no bruits. No lymphadenopathy or thryomegaly appreciated. Cor: PMI nondisplaced. Distant HS s2 depressed  I dont hear much of an aortic murmur Lungs: clear Abdomen: soft, nontender, nondistended. No hepatosplenomegaly. No bruits or masses. Good bowel sounds. Extremities: no cyanosis, clubbing, rash, edema Left knee swollen and sore. No cellulitis painful ROM Neuro: alert & orientedx3, cranial nerves grossly intact. moves all 4 extremities w/o difficulty. Affect pleasant  Telemetry: AVpaced 80-90s Personally reviewed   Labs: Basic Metabolic Panel: Recent Labs  Lab 11/18/18 0426 11/19/18 0513 11/20/18 0645 11/21/18 0431 11/22/18 0228  NA 140 138 138 136 137  K 4.1 4.3 4.4 4.6 4.2  CL 98 101 103 103 97*  CO2 30 28 28  25 29   GLUCOSE 117* 134* 132* 119* 137*  BUN 31* 42* 37* 29* 29*  CREATININE 1.72* 1.95* 1.42* 1.17 1.49*  CALCIUM 9.3 9.0 9.1 8.7* 9.4  MG 2.2 2.2  --   --   --     Liver Function Tests: No results for input(s): AST, ALT, ALKPHOS, BILITOT, PROT, ALBUMIN in the last 168 hours. No results for input(s): LIPASE, AMYLASE in the last 168 hours. No results for input(s): AMMONIA in the last 168 hours.  CBC: Recent Labs  Lab 11/16/18 2135  WBC 10.4  HGB 15.9  HCT 48.2  MCV 98.0  PLT 216    Cardiac Enzymes: No results for input(s): CKTOTAL, CKMB, CKMBINDEX, TROPONINI in the last 168 hours.  BNP: BNP (last 3 results) Recent Labs    05/20/18 1218 09/18/18 0833 11/16/18 2135  BNP 390.6* 578* 783.8*    ProBNP (last 3 results) No results for input(s): PROBNP in the last 8760 hours.    Other results:  Imaging: [place] Ekg Site Rite  Result Date: 11/21/2018 If [facility] image not attached, placement could not be confirmed due to current cardiac rhythm.     Medications:     Scheduled Medications: . clopidogrel  75 mg Oral Daily  . digoxin  0.125 mg Oral Daily  . rivaroxaban  20 mg Oral Q supper  . rosuvastatin  20 mg Oral q1800  . sacubitril-valsartan  1 tablet Oral BID  . sodium chloride  flush  10-40 mL Intracatheter Q12H  . sodium chloride flush  3 mL Intravenous Q12H  . spironolactone  12.5 mg Oral Daily     Infusions: . sodium chloride       PRN Medications:  sodium chloride, acetaminophen, ondansetron (ZOFRAN) IV, sodium chloride flush, sodium chloride flush   Assessment/Plan:   1. Acute on chronic combined systolic heart failure  - due to ischemic CM and likely low-gradient severe AS +/- chronic RV pacing - echocardiogram 09/23/2018 showed persistently low LV function at 20%, wall motion abnormality, reduced right ventricular function, moderate low output low gradient aortic stenosis. - developed AKI with IV lasix so lasix held and renal function  improved but RedS still 43%. I gave another dose IV lasix on 8/21. Weight down 5 pounds but creatininee up again - PICC placed this am. CVP 5. Co-ox 71% (co-ox much better than I expected) - Will stop IV lasix and resume po diuretics. Watch renal function closely appears to have very narrow euvolemic window  2.  Probable low-gradient severe AS - Echo reviewed personally. Dimensionless index not measured on last echo - will repeat echo  - review with Dr. Aundra Dubin and Structural heart team  - can start dobutamine as needed but co-ox improved  3. CHB s/p [employer] PPM - Chronic RV pacing could be contributing. Will check EKG - Dr. [name] follows closely. Recent plant for CRT-P vs CRT-D upgrade but doubt that would be sufficient now to help him    4.  CADs/p CABG x 3 1996. -No s/s ischemia -Continue Plavix and statin.  5.  Paroxysmal atrial fibrillation/flutter - He was in sinus rhythm on admission. - Currently AV paced rhythm - Continue Xarelto for anticoagulation.  No bleeding issue.  Rate controlled.  6.  AKI - Resolved with reducing diuretics and hydration but recurred with diuresis.  - Will stop IV lasix and resume po diuretics. Watch renal function closely appears to have very narrow euvolemic window  7. Left knee pain - suspect acute gout - check uric acid. Start prednisone   Length of Stay: 5   Glori Bickers MD 11/22/2018, 10:26 AM  Advanced Heart Failure Team Pager [phone] (M-F; [place])  Please contact [facility] for night-coverage after hours (4p -7a ) and weekends on [email or web address]

## 2018-11-23 ENCOUNTER — Inpatient Hospital Stay (HOSPITAL_COMMUNITY): Payer: Medicare Other

## 2018-11-23 DIAGNOSIS — I350 Nonrheumatic aortic (valve) stenosis: Secondary | ICD-10-CM

## 2018-11-23 LAB — BASIC METABOLIC PANEL
Anion gap: 12 (ref 5–15)
BUN: 37 mg/dL — ABNORMAL HIGH (ref 8–23)
CO2: 24 mmol/L (ref 22–32)
Calcium: 9.4 mg/dL (ref 8.9–10.3)
Chloride: 99 mmol/L (ref 98–111)
Creatinine, Ser: 1.25 mg/dL — ABNORMAL HIGH (ref 0.61–1.24)
GFR calc Af Amer: >60 mL/min (ref >60)
GFR calc non Af Amer: 54 mL/min — ABNORMAL LOW (ref >60)
Glucose, Bld: 215 mg/dL — ABNORMAL HIGH (ref 70–99)
Potassium: 4.5 mmol/L (ref 3.5–5.1)
Sodium: 135 mmol/L (ref 135–145)

## 2018-11-23 LAB — COOXEMETRY PANEL
Carboxyhemoglobin: 2 % — ABNORMAL HIGH (ref 0.5–1.5)
Methemoglobin: 0.9 % (ref 0.0–1.5)
O2 Saturation: 59.2 %
Total hemoglobin: 16.4 g/dL — ABNORMAL HIGH (ref 12.0–16.0)

## 2018-11-23 LAB — ECHOCARDIOGRAM COMPLETE
Height: 68 in
Weight: 2804.25 oz

## 2018-11-23 MED ORDER — FUROSEMIDE 40 MG PO TABS
60.0000 mg | ORAL_TABLET | Freq: Two times a day (BID) | ORAL | Status: DC
  Administered 2018-11-23 – 2018-11-24 (×3): 60 mg via ORAL
  Filled 2018-11-23 (×4): qty 1

## 2018-11-23 MED ORDER — FUROSEMIDE 40 MG PO TABS
40.0000 mg | ORAL_TABLET | Freq: Two times a day (BID) | ORAL | Status: DC
  Administered 2018-11-23: 40 mg via ORAL
  Filled 2018-11-23: qty 1

## 2018-11-23 NOTE — Progress Notes (Signed)
  Echocardiogram 2D Echocardiogram has been performed.  Ricardo Hunt 11/23/2018, 12:02 PM

## 2018-11-23 NOTE — Progress Notes (Signed)
PROGRESS NOTE  Yuriy KEVON MEULEMANS [id number] DOB: [birth year] DOA: 11/16/2018 PCP: Dione Housekeeper, MD  Brief History   Hutton O Siskis a 78 y.o.malewith medical history significant forcoronary artery disease status postremoteCABGand then 2 stents in February 2020, atrial fibrillation on Xarelto, complete heart block with pacer, and ischemic cardiomyopathy with EF 20% in June, now presenting to the emergency department for insidiously worsening dyspnea. Patient reports that he has had some shortness of breath for the past 3 months or so, but has worsened significantly over the past couple days.He reports some bilateral ankle swelling, but does not feel that this has changed much recently. He denied chest pain, fevers, chills, or cough.Patient believes his weight has been fairly stable. He reports eating a lot of salty foods, but states that he always has and there has not been any increase in salt or fluid intake. States that there has not been any medication changes since February and he has continued to be adherent tohis regimen.  ED Course:Upon arrival to the ED, patient is found to be afebrile, saturating mid 90s on room air, slightly tachypneic, and with stable blood pressure. EKG features a sinus rhythm with PVCs, fusion complexes, and LBBB. Chest x-ray is notable for cardiomegaly and vascular congestion with concern for early pulmonary edema. Chemistry panel is notable for a creatinine 1.37, similar to priors. CBC is unremarkable. Troponin is mildly elevated and BNP is elevated to 784 which is higher than priors. Patient was given 40 mg IV Lasix in the emergency department a couple hours ago, but has not urinated much and remains dyspneic at rest, only able to ambulate a few steps without beginning to develop distress, and he will be observed for ongoing evaluation and management.  The patient was admitted to a telemetry bed. He was diuresed, but has been de-escalated to oral  lasix 40 mg bid due to a bump in creatinine and hypotension. Beta blocker and lasix doses have been held due to hypotension as has entresto. Heart failure team has been consulted. PICC has been placed and CVP is being monitored.  Echocardiogram was performed on 11/23/2018. It demonstrated an EF of 123456 with diastolic dysfunction, akinesis of the inferior wall, mid-apical anterior wall, and apex.   Consultants  . Heart Failure Team  Procedures  . None  Antibiotics   Anti-infectives (From admission, onward)   None     Subjective  The patient is resting comfortably. No new complaints.  Objective   Vitals:  Vitals:   11/23/18 1110 11/23/18 1603  BP: 93/75 103/65  Pulse: 72 70  Resp:  18  Temp: 98.4 F (36.9 C) 97.8 F (36.6 C)  SpO2: 97% 94%    Exam:  Constitutional:  . The patient is awake, alert, and oriented x 3. No acute distress. Respiratory:  . No increased work of breathing. . No wheezes, rales, or rhonchi . No tactile fremitus. Cardiovascular:  . Regular rate and rhythm. . No murmurs, ectopy, or gallups.  . No lateral PMI. No thrills. Abdomen:  . Abdomen is soft, non-tender, non-distended. . No hernias, masses, or hernias are appreciated. . Normoactive bowel sounds. Musculoskeletal:  . No cyanosis, clubbing, or edema. Skin:  . No rashes, lesions, ulcers . palpation of skin: no induration or nodules Neurologic:  . CN 2-12 intact . Sensation all 4 extremities intact Psychiatric:  . Mental status o Mood, affect appropriate o Orientation to person, place, time  . judgment and insight appear intact   I have personally reviewed  the following:   Today's Data  . Vitals, BMP  Cardiology Data  . Echo 09/2018 EF 20% with global hypokinesis. Marland Kitchen Echo 123XX123 EF 123456, Diastolic dysfunction, akinesis of the inferior wall, and mid-apical anterior wall and apex.  Scheduled Meds: . clopidogrel  75 mg Oral Daily  . digoxin  0.125 mg Oral Daily  . furosemide   60 mg Oral BID  . predniSONE  40 mg Oral Q breakfast  . rivaroxaban  15 mg Oral Q supper  . rosuvastatin  20 mg Oral q1800  . sacubitril-valsartan  1 tablet Oral BID  . sodium chloride flush  10-40 mL Intracatheter Q12H  . sodium chloride flush  3 mL Intravenous Q12H  . spironolactone  12.5 mg Oral Daily   Continuous Infusions: . sodium chloride      Principal Problem:   Acute on chronic systolic CHF (congestive heart failure) (HCC) Active Problems:   Coronary artery disease involving native coronary artery of native heart with angina pectoris (HCC)   Complete heart block (HCC) s/p Pacemaker in 2014   Atrial fibrillation and flutter (HCC)   CKD (chronic kidney disease), stage III ([place])   LOS: 6 days   A & P  Acute on chronic systolic congestive heart failure . Patient presenting from home with progressive shortness of breath and lower extremity edema.  BNP elevated to 784 with chest x-ray notable for cardiomegaly with vascular congestion and pulmonary edema.  Previous echo from June 2020 with EF 20%, diffuse HK, moderate LAE, mild-mod TR, and at least moderate AS. Fluid balance negative by 659.8. Coreg, Lasix, and entresto held due to hypotension. Advanced heart failure team consulted. The patient now has a PICC and his CVP is being monitored. Echo 123XX123 EF 123456, Diastolic dysfunction, akinesis of the inferior wall, and mid-apical anterior wall and apex.  Hypotension: Resolving. CVP is 8-9 today. Not orthostatic. Lasix has been increased to 80 mg daily.  CAD: Continue home doses of plavix, statin and beta blocker (held according to parameters).  Paroxysmal atrial fibrillation: Heart rate is controlled on beta blocker. Xarelto for CVA prophylaxis for CHADS-VASc of 4. Currently in normal sinus rhythm on telemetry.  Complete heart block: Pacemaker placed in 2014, denies lightheadedness, palpitations, or syncope. His electrophysiologist is planning for upgrade at some point.  Monitor on telemetry.  I have seen and examined this patient myself. I have spent 35 minutes in his evaluation and care.  DVT prophylaxis: Xarelto Code Status: Full code Family Communication: None Disposition Plan: Continue inpatient hospitalization, PT/OT consult pending, further dependent on clinical course  Thaddaeus Granja, DO [facility] Direct contact: see [email or web address]  7PM-7AM contact night coverage as above 11/23/2018, 5:47 PM  LOS: 2 days

## 2018-11-23 NOTE — Progress Notes (Signed)
Advanced Heart Failure Rounding Note   Subjective:    Admitted with recurrent HF. Creatinine bumped with diuresis but ReDS still 43%. I gave him another dose of IV lasix when I saw him in consult. Weight down 5 pounds but creatinine bumped again.   Now back on po lasix. Co-ox 70% - > 59% today Creatinine improved. Weight up 1 pound.CVP 8-9 (was 5 yesterday)  Says he feels a bit better. Breathing improved but still SOB with any exertion. No orthopnea or PND. No CP.  Left knee pain improving with steroids)   Objective:   Weight Range:  Vital Signs:   Temp:  [97.6 F (36.4 C)-98.9 F (37.2 C)] 97.8 F (36.6 C) (08/23 0744) Pulse Rate:  [70-109] 72 (08/23 1000) Resp:  [16-19] 17 (08/23 0744) BP: (89-129)/(47-93) 108/75 (08/23 0744) SpO2:  [93 %-100 %] 98 % (08/23 0744) Weight:  [79.5 kg] 79.5 kg (08/23 0500) Last BM Date: 11/21/18  Weight change: Filed Weights   11/21/18 0359 11/22/18 0701 11/23/18 0500  Weight: 81.3 kg 79.2 kg 79.5 kg    Intake/Output:   Intake/Output Summary (Last 24 hours) at 11/23/2018 1103 Last data filed at 11/23/2018 1002 Gross per 24 hour  Intake 510 ml  Output 200 ml  Net 310 ml     Physical Exam: General:  Well appearing. No resp difficulty HEENT: normal Neck: supple. 8-9. Carotids 2+ bilat; . No lymphadenopathy or thryomegaly appreciated. Cor: PMI nondisplaced. Regular rate & rhythm. 2/6 AS  Lungs: clear Abdomen: soft, nontender, nondistended. No hepatosplenomegaly. No bruits or masses. Good bowel sounds. Extremities: no cyanosis, clubbing, rash, edema L knee mildly swollen but improved Neuro: alert & orientedx3, cranial nerves grossly intact. moves all 4 extremities w/o difficulty. Affect pleasant   Telemetry: AVpaced 70-80s Personally reviewed   Labs: Basic Metabolic Panel: Recent Labs  Lab 11/18/18 0426 11/19/18 0513 11/20/18 0645 11/21/18 0431 11/22/18 0228 11/23/18 0458  NA 140 138 138 136 137 135  K 4.1 4.3 4.4 4.6  4.2 4.5  CL 98 101 103 103 97* 99  CO2 30 28 28 25 29 24   GLUCOSE 117* 134* 132* 119* 137* 215*  BUN 31* 42* 37* 29* 29* 37*  CREATININE 1.72* 1.95* 1.42* 1.17 1.49* 1.25*  CALCIUM 9.3 9.0 9.1 8.7* 9.4 9.4  MG 2.2 2.2  --   --   --   --     Liver Function Tests: No results for input(s): AST, ALT, ALKPHOS, BILITOT, PROT, ALBUMIN in the last 168 hours. No results for input(s): LIPASE, AMYLASE in the last 168 hours. No results for input(s): AMMONIA in the last 168 hours.  CBC: Recent Labs  Lab 11/16/18 2135  WBC 10.4  HGB 15.9  HCT 48.2  MCV 98.0  PLT 216    Cardiac Enzymes: No results for input(s): CKTOTAL, CKMB, CKMBINDEX, TROPONINI in the last 168 hours.  BNP: BNP (last 3 results) Recent Labs    05/20/18 1218 09/18/18 0833 11/16/18 2135  BNP 390.6* 578* 783.8*    ProBNP (last 3 results) No results for input(s): PROBNP in the last 8760 hours.    Other results:  Imaging: [place] Ekg Site Rite  Result Date: 11/21/2018 If [facility] image not attached, placement could not be confirmed due to current cardiac rhythm.    Medications:     Scheduled Medications:  clopidogrel  75 mg Oral Daily   digoxin  0.125 mg Oral Daily   furosemide  40 mg Oral BID   predniSONE  40 mg Oral Q breakfast   rivaroxaban  15 mg Oral Q supper   rosuvastatin  20 mg Oral q1800   sacubitril-valsartan  1 tablet Oral BID   sodium chloride flush  10-40 mL Intracatheter Q12H   sodium chloride flush  3 mL Intravenous Q12H   spironolactone  12.5 mg Oral Daily    Infusions:  sodium chloride      PRN Medications: sodium chloride, acetaminophen, ondansetron (ZOFRAN) IV, sodium chloride flush, sodium chloride flush   Assessment/Plan:   1. Acute on chronic combined systolic heart failure  - due to ischemic CM and possibly low-gradient severe AS +/- chronic RV pacing - echocardiogram 09/23/2018 showed persistently low LV function at 20%, wall motion abnormality, reduced right  ventricular function, moderate low output low gradient aortic stenosis. - I was in the room while echo being done today. EF 20-25% and AS does not appear critical. RV mild to moderate - developed AKI with IV lasix so lasix held and renal function improved but RedS still 43% earlier this week. I gave another dose IV lasix on 8/21. Weight down 5 pounds but creatinine up again - IV lasix stopped yesterday with CVP 5 and back on po diuretics. CVP 8-9 today.  Weight up 1 pound. Increase lasix to 60 bid. Watch renal function closely appears to have very narrow euvolemic window - Co-ox 71%-> 59%. Will follow. If continues to drop may need to consider LVAD.   2.  Probable low-gradient severe AS - I was in the room while echo being done today. EF 20-25% and AS does not appear critical. RV mild to moderate  3. CHB s/p [employer] PPM - Chronic RV pacing could be contributing.  - Dr. [name] follows closely.  - Recent plan for CRT-P vs CRT-D upgrade. Given that AS not critical this may be viable option to help him   4.  CADs/p CABG x 3 1996. -No s/s ischemia -Continue Plavix and statin.  5.  Paroxysmal atrial fibrillation/flutter - He was in sinus rhythm on admission. - Currently AV paced rhythm - Continue Xarelto for anticoagulation.  No bleeding issue.  Rate controlled.  6.  AKI - Resolved with reducing diuretics and hydration but recurred with diuresis.  - Now back on po diuretics. Weight up slightly. Watch renal function closely appears to have very narrow euvolemic window  7. Left knee pain - suspect acute gout - Uric acid 9.0 - On prednisone burst. Today 2/3. Pain improving   Length of Stay: 6   Glori Bickers MD 11/23/2018, 11:03 AM  Advanced Heart Failure Team Pager [phone] (M-F; 7a - 4p)  Please contact [facility] for night-coverage after hours (4p -7a ) and weekends on [email or web address]

## 2018-11-24 DIAGNOSIS — I4892 Unspecified atrial flutter: Secondary | ICD-10-CM

## 2018-11-24 DIAGNOSIS — I4891 Unspecified atrial fibrillation: Secondary | ICD-10-CM

## 2018-11-24 LAB — BASIC METABOLIC PANEL
Anion gap: 10 (ref 5–15)
BUN: 46 mg/dL — ABNORMAL HIGH (ref 8–23)
CO2: 26 mmol/L (ref 22–32)
Calcium: 9.4 mg/dL (ref 8.9–10.3)
Chloride: 100 mmol/L (ref 98–111)
Creatinine, Ser: 1.41 mg/dL — ABNORMAL HIGH (ref 0.61–1.24)
GFR calc Af Amer: 54 mL/min — ABNORMAL LOW (ref >60)
GFR calc non Af Amer: 47 mL/min — ABNORMAL LOW (ref >60)
Glucose, Bld: 186 mg/dL — ABNORMAL HIGH (ref 70–99)
Potassium: 4.1 mmol/L (ref 3.5–5.1)
Sodium: 136 mmol/L (ref 135–145)

## 2018-11-24 LAB — COOXEMETRY PANEL
Carboxyhemoglobin: 1.6 % — ABNORMAL HIGH (ref 0.5–1.5)
Methemoglobin: 1 % (ref 0.0–1.5)
O2 Saturation: 65.7 %
Total hemoglobin: 16.5 g/dL — ABNORMAL HIGH (ref 12.0–16.0)

## 2018-11-24 LAB — SURGICAL PCR SCREEN
MRSA, PCR: NEGATIVE
Staphylococcus aureus: NEGATIVE

## 2018-11-24 MED ORDER — SODIUM CHLORIDE 0.9 % IV SOLN
80.0000 mg | INTRAVENOUS | Status: AC
  Administered 2018-11-25: 80 mg
  Filled 2018-11-24: qty 2

## 2018-11-24 MED ORDER — DIPHENHYDRAMINE HCL 12.5 MG/5ML PO ELIX
12.5000 mg | ORAL_SOLUTION | Freq: Once | ORAL | Status: AC
  Administered 2018-11-24: 12.5 mg via ORAL
  Filled 2018-11-24: qty 10

## 2018-11-24 MED ORDER — SODIUM CHLORIDE 0.9 % IV SOLN
INTRAVENOUS | Status: DC
  Administered 2018-11-25: 50 mL/h via INTRAVENOUS

## 2018-11-24 MED ORDER — CHLORHEXIDINE GLUCONATE 4 % EX LIQD
60.0000 mL | Freq: Once | CUTANEOUS | Status: AC
  Administered 2018-11-25: 4 via TOPICAL

## 2018-11-24 MED ORDER — CHLORHEXIDINE GLUCONATE 4 % EX LIQD
60.0000 mL | Freq: Once | CUTANEOUS | Status: AC
  Administered 2018-11-24: 4 via TOPICAL
  Filled 2018-11-24: qty 60

## 2018-11-24 MED ORDER — CEFAZOLIN SODIUM-DEXTROSE 2-4 GM/100ML-% IV SOLN
2.0000 g | INTRAVENOUS | Status: AC
  Administered 2018-11-25: 2 g via INTRAVENOUS
  Filled 2018-11-24: qty 100

## 2018-11-24 MED ORDER — CARVEDILOL 3.125 MG PO TABS
3.1250 mg | ORAL_TABLET | Freq: Two times a day (BID) | ORAL | Status: DC
  Administered 2018-11-24 – 2018-11-27 (×7): 3.125 mg via ORAL
  Filled 2018-11-24 (×7): qty 1

## 2018-11-24 NOTE — TOC Progression Note (Signed)
Transition of Care [facility]) - Progression Note    Patient Details  Name: Ricardo Hunt MRN: [id number] Date of Birth: 03-04-1941  Transition of Care [facility]) CM/SW Contact  Graves-Bigelow, Ocie Cornfield, RN Phone Number: 11/24/2018, 3:22 PM  Clinical Narrative: CM received referral for outpatient PT Services- vestibular. Patient did not think he needed Outpatient PT- CM did consult with PT and new note placed- no needs. Patient is aware and agreeable. No services set up at this time and patient is aware to contact PCP in the future if he needs anything.      Expected Discharge Plan: Home/Self Care Barriers to Discharge: Continued Medical Work up  Expected Discharge Plan and Services Expected Discharge Plan: Home/Self Care   Discharge Planning Services: CM Consult   Living arrangements for the past 2 months: Mobile Home(double wide)                 HH Arranged: Patient Refused HH      Social Determinants of Health (SDOH) Interventions    Readmission Risk Interventions No flowsheet data found.

## 2018-11-24 NOTE — Progress Notes (Signed)
Pt's daughter, Lattie Haw, called to give update per patient request, however Lattie Haw did not answer the phone. Will try again later this evening.

## 2018-11-24 NOTE — Progress Notes (Signed)
  EP appreciated.  Plan for device upgrade tomorrow.  Remove PICC and placed saline locks per EP recommendations.  Amy Clegg   NP-C  12:32 PM

## 2018-11-24 NOTE — Progress Notes (Signed)
Xarelto to be held this evening per Chanetta Marshall, NP with EP.

## 2018-11-24 NOTE — Progress Notes (Signed)
Asked to see patient to evaluate for CRT upgrade while admitted.  Pt feels well today.  Will plan for CRT upgrade tomorrow. Need PICC line pulled ASAP. Discussed with nursing and HF team. He needs 24 hours without central lines prior to upgrade of device.   Orders placed. Dr [name] to see tomorrow  Chanetta Marshall, NP 11/24/2018 12:09 PM

## 2018-11-24 NOTE — Discharge Instructions (Addendum)
Supplemental Discharge Instructions for  Pacemaker/Defibrillator Patients  Activity No heavy lifting or vigorous activity with your left/right arm for 6 to 8 weeks.  Do not raise your left/right arm above your head for one week.  Gradually raise your affected arm as drawn below.           __          11/30/18                   12/01/18                       12/02/18                       12/03/18  NO DRIVING for   1 week  ; you may begin driving on   [id number]  .  WOUND CARE - Keep the wound area clean and dry.  Do not get this area wet for one week. No showers for one week; you may shower on   12/03/18  . - The tape/steri-strips on your wound will fall off; do not pull them off.  No bandage is needed on the site.  DO  NOT apply any creams, oils, or ointments to the wound area. - If you notice any drainage or discharge from the wound, any swelling or bruising at the site, or you develop a fever > 101? F after you are discharged home, call the office at once.  Special Instructions - You are still able to use cellular telephones; use the ear opposite the side where you have your pacemaker/defibrillator.  Avoid carrying your cellular phone near your device. - When traveling through airports, show security personnel your identification card to avoid being screened in the metal detectors.  Ask the security personnel to use the hand wand. - Avoid arc welding equipment, MRI testing (magnetic resonance imaging), TENS units (transcutaneous nerve stimulators).  Call the office for questions about other devices. - Avoid electrical appliances that are in poor condition or are not properly grounded. - Microwave ovens are safe to be near or to operate.   Information on my medicine - XARELTO (Rivaroxaban)  Why was Xarelto prescribed for you? Xarelto was prescribed for you to reduce the risk of a blood clot forming that can cause a stroke if you have a medical condition called atrial fibrillation (a type  of irregular heartbeat).  What do you need to know about xarelto ? Take your Xarelto ONCE DAILY at the same time every day with your evening meal. If you have difficulty swallowing the tablet whole, you may crush it and mix in applesauce just prior to taking your dose.  Take Xarelto exactly as prescribed by your doctor and DO NOT stop taking Xarelto without talking to the doctor who prescribed the medication.  Stopping without other stroke prevention medication to take the place of Xarelto may increase your risk of developing a clot that causes a stroke.  Refill your prescription before you run out.  After discharge, you should have regular check-up appointments with your healthcare provider that is prescribing your Xarelto.  In the future your dose may need to be changed if your kidney function or weight changes by a significant amount.  What do you do if you miss a dose? If you are taking Xarelto ONCE DAILY and you miss a dose, take it as soon as you remember on the same day then continue  your regularly scheduled once daily regimen the next day. Do not take two doses of Xarelto at the same time or on the same day.   Important Safety Information A possible side effect of Xarelto is bleeding. You should call your healthcare provider right away if you experience any of the following: ? Bleeding from an injury or your nose that does not stop. ? Unusual colored urine (red or dark brown) or unusual colored stools (red or black). ? Unusual bruising for unknown reasons. ? A serious fall or if you hit your head (even if there is no bleeding).  Some medicines may interact with Xarelto and might increase your risk of bleeding while on Xarelto. To help avoid this, consult your healthcare provider or pharmacist prior to using any new prescription or non-prescription medications, including herbals, vitamins, non-steroidal anti-inflammatory drugs (NSAIDs) and supplements.  This website has more  information on Xarelto: [email or web address]

## 2018-11-24 NOTE — Progress Notes (Signed)
PROGRESS NOTE  Ricardo Hunt [id number] DOB: 01/05/1941 DOA: 11/16/2018 PCP: Dione Housekeeper, MD  Brief History   Ricardo Hunt a 78 y.o.malewith medical history significant forcoronary artery disease status postremoteCABGand then 2 stents in February 2020, atrial fibrillation on Xarelto, complete heart block with pacer, and ischemic cardiomyopathy with EF 20% in June, now presenting to the emergency department for insidiously worsening dyspnea. Patient reports that he has had some shortness of breath for the past 3 months or so, but has worsened significantly over the past couple days.He reports some bilateral ankle swelling, but does not feel that this has changed much recently. He denied chest pain, fevers, chills, or cough.Patient believes his weight has been fairly stable. He reports eating a lot of salty foods, but states that he always has and there has not been any increase in salt or fluid intake. States that there has not been any medication changes since February and he has continued to be adherent tohis regimen.  ED Course:Upon arrival to the ED, patient is found to be afebrile, saturating mid 90s on room air, slightly tachypneic, and with stable blood pressure. EKG features a sinus rhythm with PVCs, fusion complexes, and LBBB. Chest x-ray is notable for cardiomegaly and vascular congestion with concern for early pulmonary edema. Chemistry panel is notable for a creatinine 1.37, similar to priors. CBC is unremarkable. Troponin is mildly elevated and BNP is elevated to 784 which is higher than priors. Patient was given 40 mg IV Lasix in the emergency department a couple hours ago, but has not urinated much and remains dyspneic at rest, only able to ambulate a few steps without beginning to develop distress, and he will be observed for ongoing evaluation and management.  The patient was admitted to a telemetry bed. He was diuresed, but has been de-escalated to oral  lasix 40 mg bid due to a bump in creatinine and hypotension. Beta blocker and lasix doses have been held due to hypotension as has entresto. Heart failure team has been consulted. PICC has been placed and CVP is being monitored.  Echocardiogram was performed on 11/23/2018. It demonstrated an EF of 123456 with diastolic dysfunction, akinesis of the inferior wall, mid-apical anterior wall, and apex.   EP Cardiology plans to upgrade the patient's pacemaker tomorrow to improve his cardiac function.  Consultants  . Heart Failure Team  Procedures  . None  Antibiotics   Anti-infectives (From admission, onward)   Start     Dose/Rate Route Frequency Ordered Stop   11/25/18 1400  gentamicin (GARAMYCIN) 80 mg in sodium chloride 0.9 % 500 mL irrigation     80 mg Irrigation To Cath Lab 11/24/18 1254 11/26/18 1400   11/25/18 1400  ceFAZolin (ANCEF) IVPB 2g/100 mL premix     2 g 200 mL/hr over 30 Minutes Intravenous To Cath Lab 11/24/18 1254 11/26/18 1400     Subjective  The patient is resting comfortably. No new complaints.  Objective   Vitals:  Vitals:   11/24/18 1501 11/24/18 1504  BP: 94/65 102/63  Pulse: 72   Resp:    Temp: 97.9 F (36.6 C)   SpO2: 94%     Exam:  Constitutional:  . The patient is awake, alert, and oriented x 3. No acute distress. Respiratory:  . No increased work of breathing. . No wheezes, rales, or rhonchi . No tactile fremitus. Cardiovascular:  . Regular rate and rhythm. . No murmurs, ectopy, or gallups.  . No lateral PMI. No thrills. Abdomen:  .  Abdomen is soft, non-tender, non-distended. . No hernias, masses, or hernias are appreciated. . Normoactive bowel sounds. Musculoskeletal:  . No cyanosis, clubbing, or edema. Skin:  . No rashes, lesions, ulcers . palpation of skin: no induration or nodules Neurologic:  . CN 2-12 intact . Sensation all 4 extremities intact Psychiatric:  . Mental status o Mood, affect appropriate o Orientation to person,  place, time  . judgment and insight appear intact   I have personally reviewed the following:   Today's Data  . Vitals, BMP  Cardiology Data  . Echo 09/2018 EF 20% with global hypokinesis. Marland Kitchen Echo 123XX123 EF 123456, Diastolic dysfunction, akinesis of the inferior wall, and mid-apical anterior wall and apex.  Scheduled Meds: . carvedilol  3.125 mg Oral BID WC  . chlorhexidine  60 mL Topical Once  . [START ON 11/25/2018] chlorhexidine  60 mL Topical Once  . clopidogrel  75 mg Oral Daily  . digoxin  0.125 mg Oral Daily  . furosemide  60 mg Oral BID  . [START ON 11/25/2018] gentamicin irrigation  80 mg Irrigation To Cath  . rivaroxaban  15 mg Oral Q supper  . rosuvastatin  20 mg Oral q1800  . sacubitril-valsartan  1 tablet Oral BID  . sodium chloride flush  10-40 mL Intracatheter Q12H  . spironolactone  12.5 mg Oral Daily   Continuous Infusions: . sodium chloride    . [START ON 11/25/2018]  ceFAZolin (ANCEF) IV      Principal Problem:   Acute on chronic systolic CHF (congestive heart failure) (HCC) Active Problems:   Coronary artery disease involving native coronary artery of native heart with angina pectoris (HCC)   Complete heart block (HCC) s/p Pacemaker in 2014   Atrial fibrillation and flutter (HCC)   CKD (chronic kidney disease), stage III (HCC)   LOS: 7 days   A & P  Acute on chronic systolic congestive heart failure Patient presenting from home with progressive shortness of breath and lower extremity edema.  BNP elevated to 784 with chest x-ray notable for cardiomegaly with vascular congestion and pulmonary edema.  Previous echo from June 2020 with EF 20%, diffuse HK, moderate LAE, mild-mod TR, and at least moderate AS. Fluid balance negative by 659.8. Coreg, Lasix, and entresto held due to hypotension. Advanced heart failure team consulted. The patient now has a PICC and his CVP is being monitored. Echo 123XX123 EF 123456, Diastolic dysfunction, akinesis of the inferior wall,  and mid-apical anterior wall and apex. EP Cardiology plans to upgrade the patient's pacemaker tomorrow to improve his cardiac function.  Hypotension: Resolving. CVP is 8-9 today. Not orthostatic. Lasix has been increased to 80 mg daily.  CAD: Continue home doses of plavix, statin and beta blocker (held according to parameters).  Paroxysmal atrial fibrillation: Heart rate is controlled on beta blocker. Xarelto for CVA prophylaxis for CHADS-VASc of 4. Currently in normal sinus rhythm on telemetry.  Complete heart block: Pacemaker placed in 2014, denies lightheadedness, palpitations, or syncope. His electrophysiologist is planning for upgrade at some point. Monitor on telemetry.  I have seen and examined this patient myself. I have spent 32 minutes in his evaluation and care.  DVT prophylaxis: Xarelto Code Status: Full code Family Communication: None Disposition Plan: Continue inpatient hospitalization, PT/OT consult pending, further dependent on clinical course  Malcolm Quast, DO [facility] Direct contact: see [email or web address]  7PM-7AM contact night coverage as above 11/23/2018, 5:47 PM  LOS: 2 days

## 2018-11-24 NOTE — Progress Notes (Addendum)
Advanced Heart Failure Rounding Note   Subjective:   CO-OX 66%. Yesterday switched to lasix 60 mg daily.   Feeling better. Denies SOB.   Objective:   Weight Range:  Vital Signs:   Temp:  [97.8 F (36.6 C)-98.6 F (37 C)] 98.6 F (37 C) (08/24 0511) Pulse Rate:  [70-76] 70 (08/24 0511) Resp:  [18] 18 (08/23 1603) BP: (93-117)/(62-80) 117/74 (08/24 0511) SpO2:  [94 %-97 %] 96 % (08/24 0511) Weight:  [79.2 kg] 79.2 kg (08/24 0513) Last BM Date: 11/23/18  Weight change: Filed Weights   11/22/18 0701 11/23/18 0500 11/24/18 0513  Weight: 79.2 kg 79.5 kg 79.2 kg    Intake/Output:   Intake/Output Summary (Last 24 hours) at 11/24/2018 0753 Last data filed at 11/24/2018 [id number] Gross per 24 hour  Intake 940 ml  Output 2100 ml  Net -1160 ml     Physical Exam: CVP 5-6  General:   No resp difficulty. Sitting on the side of the bed.  HEENT: normal Neck: supple. no JVD. Carotids 2+ bilat; no bruits. No lymphadenopathy or thryomegaly appreciated. Cor: PMI nondisplaced. Regular rate & rhythm. No rubs, gallops. 2/6 AS  Lungs: clear Abdomen: soft, nontender, nondistended. No hepatosplenomegaly. No bruits or masses. Good bowel sounds. Extremities: no cyanosis, clubbing, rash, edema. RUE PICC Neuro: alert & orientedx3, cranial nerves grossly intact. moves all 4 extremities w/o difficulty. Affect pleasant   Telemetry: A sensed V paced 90 bpm    Labs: Basic Metabolic Panel: Recent Labs  Lab 11/18/18 0426 11/19/18 0513 11/20/18 0645 11/21/18 0431 11/22/18 0228 11/23/18 0458 11/24/18 0454  NA 140 138 138 136 137 135 136  K 4.1 4.3 4.4 4.6 4.2 4.5 4.1  CL 98 101 103 103 97* 99 100  CO2 30 28 28 25 29 24 26   GLUCOSE 117* 134* 132* 119* 137* 215* 186*  BUN 31* 42* 37* 29* 29* 37* 46*  CREATININE 1.72* 1.95* 1.42* 1.17 1.49* 1.25* 1.41*  CALCIUM 9.3 9.0 9.1 8.7* 9.4 9.4 9.4  MG 2.2 2.2  --   --   --   --   --     Liver Function Tests: No results for input(s): AST,  ALT, ALKPHOS, BILITOT, PROT, ALBUMIN in the last 168 hours. No results for input(s): LIPASE, AMYLASE in the last 168 hours. No results for input(s): AMMONIA in the last 168 hours.  CBC: No results for input(s): WBC, NEUTROABS, HGB, HCT, MCV, PLT in the last 168 hours.  Cardiac Enzymes: No results for input(s): CKTOTAL, CKMB, CKMBINDEX, TROPONINI in the last 168 hours.  BNP: BNP (last 3 results) Recent Labs    05/20/18 1218 09/18/18 0833 11/16/18 2135  BNP 390.6* 578* 783.8*    ProBNP (last 3 results) No results for input(s): PROBNP in the last 8760 hours.    Other results:  Imaging: No results found.   Medications:     Scheduled Medications: . clopidogrel  75 mg Oral Daily  . digoxin  0.125 mg Oral Daily  . furosemide  60 mg Oral BID  . predniSONE  40 mg Oral Q breakfast  . rivaroxaban  15 mg Oral Q supper  . rosuvastatin  20 mg Oral q1800  . sacubitril-valsartan  1 tablet Oral BID  . sodium chloride flush  10-40 mL Intracatheter Q12H  . sodium chloride flush  3 mL Intravenous Q12H  . spironolactone  12.5 mg Oral Daily    Infusions: . sodium chloride      PRN Medications: sodium  chloride, acetaminophen, ondansetron (ZOFRAN) IV, sodium chloride flush, sodium chloride flush   Assessment/Plan:   1. Acute on chronic combined systolic heart failure  - due to ischemic CM and possibly low-gradient severe AS +/- chronic RV pacing - echocardiogram 09/23/2018 showed persistently low LV function at 20%, wall motion abnormality, reduced right ventricular function, moderate low output low gradient aortic stenosis. - Per Dr Haroldine Laws. EF 20-25% and AS does not appear critical. RV mild to moderate - developed AKI with IV lasix so lasix held and renal function improved but RedS still 43% earlier this week.  -CVP 5-6. Volume status improved. Continue lasix 60 mg po lasix.  - CO-OX 66%. Restart carvedilol 3.125 mg twice a day. (PTA carvedilol 12.5 mg twice a day)  -  Continue entresto 24-26 mg twice a day.  - Continue 12.5 mg spironolactone daily.  - Continue digoxin 0.125 mg daily.  - Will follow. If continues to drop may need to consider LVAD.   2.  Probable low-gradient severe AS - Per Dr BEnsimhon-EF 20-25% and AS does not appear critical. RV mild to moderate  3. CHB s/p St Jude PPM - Chronic RV pacing could be contributing.  - Dr. [name] follows closely.  - Recent plan for CRT-P vs CRT-D upgrade. Given that AS not critical this may be viable option to help him  - Consult EP   4.  CADs/p CABG x 3 1996. -No chest pain.  -Continue Plavix and statin.  5.  Paroxysmal atrial fibrillation/flutter - He was in sinus rhythm on admission. - Currently AV paced rhythm - Continue Xarelto for anticoagulation.  No bleeding issue.  Rate controlled.  6.  AKI Creatinine peaked at 1.9.  - Resolved with reducing diuretics and hydration but recurred with diuresis.  - Creatinine 1.4 . -  7. Left knee pain - suspect acute gout - Uric acid 9.0 - Completing prednisone burst.   Consult cardiac rehab.    Length of Stay: [place] NP-C  11/24/2018, 7:53 AM  Advanced Heart Failure Team Pager [phone] (M-F; 7a - 4p)  Please contact [facility] for night-coverage after hours (4p -7a ) and weekends on [email or web address]  Patient seen with NP, agree with the above note.   He feels good this morning, not short of breath. Co-ox 66% with CVP 5-6.  He is in NSR with V-pacing.   General: NAD Neck: No JVD, no thyromegaly or thyroid nodule.  Lungs: Clear to auscultation bilaterally with normal respiratory effort. CV: Nondisplaced PMI.  Heart regular S1/S2, no S3/S4, 2/6 SEM RUSB with clear S2.  No peripheral edema.   Abdomen: Soft, nontender, no hepatosplenomegaly, no distention.  Skin: Intact without lesions or rashes.  Neurologic: Alert and oriented x 3.  Psych: Normal affect. Extremities: No clubbing or cyanosis.  HEENT: Normal.   1. Acute on  chronic systolic CHF: Ischemic cardiomyopathy.  Echo this admission with EF 20%, mild-moderate AS.  He was admitted with volume overload and diuresed, CVP down to 5-6 today.  Co-ox good at 66%. Creatinine 1.49 => 1.25 => 1.41.  - Continue Lasix 60 mg po bid (was on 40 mg po bid at home).  - Continue Entresto 24/26 bid. - Continue digoxin.  - Continue spironolactone 12.5 mg daily.  - Can add back Coreg at 3.125 mg bid.  - He is chronically RV pacing, think he would benefit from upgrade to CRT-P.  Will discuss with EP and see if we can do this admission (has been  discussed with Dr. [name] in the past).  2. CHB: Has St Jude PPM, chronically RV pacing.  - Upgrade to CRT-P as above.  3. Aortic stenosis: Suspect no more than moderate based on echo this admission (read as mild-moderate).  4. CAD: s/p CABG 1996.  In 2/20, found to have occlusion of sequential SVG-D and OM.  Therefore, had PCI with DES to native left main and proximal LCx.   - Continue Plavix x 1 year ideally, at least 6 months.  - Continue statin.  - No ASA given Xarelto use.  5. Atrial fibrillation: Paroxysmal.  He is in NSR.   - Continue Xarelto 15 mg daily.  6. AKI: Creatinine peaked at 1.9, 1.4 today.  Follow.  7. Gout flare: Left knee. Prednisone burst.   Loralie Champagne 11/24/2018 8:25 AM

## 2018-11-24 NOTE — Care Management Important Message (Signed)
Important Message  Patient Details  Name: Ricardo Hunt MRN: [id number] Date of Birth: 1940/05/08   Medicare Important Message Given:  Yes     Shelda Altes 11/24/2018, 1:09 PM

## 2018-11-24 NOTE — Progress Notes (Signed)
CARDIAC REHAB PHASE I   PRE:  Rate/Rhythm: 75 paced  BP:  Supine: 106/74  Sitting:   Standing:    SaO2: 95%RA  MODE:  Ambulation: 400 ft   POST:  Rate/Rhythm: 106  BP:  Supine: 120/80  Sitting:   Standing:    SaO2: 96%RA 1350-1425 Pt walked 400 ft on RA with rolling walker with steady gait and tolerated well. C/o knees hurting at beginning of walk that got a little better with activity. Pt brushed teeth standing at sink. Gave low sodium diets and discussed with little effect. Pt stated he loves salt on all his fresh vegetables. He did say he tried to stay away from canned foods.   Graylon Good, RN BSN  11/24/2018 2:22 PM

## 2018-11-24 NOTE — Progress Notes (Signed)
Physical Therapy Treatment Patient Details Name: Ricardo Hunt MRN: [id number] DOB: September 23, 1940 Today's Date: 11/24/2018    History of Present Illness 78 y.o. male with medical history significant for vertigo, CAD s/p remote CABG, atrial fibrillation on Xarelto, complete heart block with pacer, and ischemic cardiomyopathy with EF 20% in June, now presenting to the emergency department for insidiously worsening dyspnea. acute on chronic CHF    PT Comments    Pt pleasant sitting in chair on arrival. Pt reports he developed Rt knee pain in addition to left knee pain today and required use of RW for off weighting bil LE. Pt with increased activity tolerance despite pain with HR 87-107 and no SOB. Pt educated for HEP and continued progression as well as DME use. D/C plan updated.     Follow Up Recommendations  No PT follow up     Equipment Recommendations  None recommended by PT    Recommendations for Other Services       Precautions / Restrictions Precautions Precautions: Fall    Mobility  Bed Mobility               General bed mobility comments: in recliner on arrival and end of session  Transfers Overall transfer level: Modified independent                  Ambulation/Gait Ambulation/Gait assistance: Supervision Gait Distance (Feet): 350 Feet Assistive device: Rolling walker (2 wheeled) Gait Pattern/deviations: Step-through pattern;Decreased stride length   Gait velocity interpretation: >2.62 ft/sec, indicative of community ambulatory General Gait Details: pt with bil knee pain today and initiated gait without UE support with increased difficulty maintaining standing. With use of RW after 10' pt able to increase stride and distance with cues for posture and position in RW   Stairs             Wheelchair Mobility    Modified Rankin (Stroke Patients Only)       Balance Overall balance assessment: No apparent balance deficits (not formally  assessed)                                          Cognition Arousal/Alertness: Awake/alert Behavior During Therapy: WFL for tasks assessed/performed Overall Cognitive Status: Within Functional Limits for tasks assessed                                        Exercises General Exercises - Lower Extremity Long Arc Quad: AROM;20 reps;Both;Seated Hip ABduction/ADduction: AROM;20 reps;Both;Seated Hip Flexion/Marching: AROM;20 reps;Both;Seated    General Comments        Pertinent Vitals/Pain Pain Score: 4  Pain Location: bil knee pain 6/10 with standing, 4/10 with gait, none at rest Pain Descriptors / Indicators: Sore;Aching Pain Intervention(s): Monitored during session;Limited activity within patient's tolerance;Repositioned    Home Living                      Prior Function            PT Goals (current goals can now be found in the care plan section) Progress towards PT goals: Progressing toward goals    Frequency           PT Plan Discharge plan needs to be updated    Co-evaluation  AM-PAC PT "6 Clicks" Mobility   Outcome Measure  Help needed turning from your back to your side while in a flat bed without using bedrails?: None Help needed moving from lying on your back to sitting on the side of a flat bed without using bedrails?: None Help needed moving to and from a bed to a chair (including a wheelchair)?: None Help needed standing up from a chair using your arms (e.g., wheelchair or bedside chair)?: None Help needed to walk in hospital room?: A Little Help needed climbing 3-5 steps with a railing? : A Little 6 Click Score: 22    End of Session   Activity Tolerance: Patient tolerated treatment well Patient left: in chair;with call bell/phone within reach Nurse Communication: Mobility status PT Visit Diagnosis: Other abnormalities of gait and mobility (R26.89)     Time: 1100-1113 PT Time  Calculation (min) (ACUTE ONLY): 13 min  Charges:  $Gait Training: 8-22 mins                     [place] Pager: [phone] Office: [place] 11/24/2018, 12:11 PM

## 2018-11-25 ENCOUNTER — Encounter (HOSPITAL_COMMUNITY)
Admission: EM | Disposition: A | Discharge status: Home / Self Care | Payer: Self-pay | Source: Home / Self Care | Attending: Internal Medicine

## 2018-11-25 DIAGNOSIS — I5022 Chronic systolic (congestive) heart failure: Secondary | ICD-10-CM

## 2018-11-25 DIAGNOSIS — I442 Atrioventricular block, complete: Secondary | ICD-10-CM

## 2018-11-25 HISTORY — PX: BIV UPGRADE: EP1202

## 2018-11-25 LAB — CBC
HCT: 46.8 % (ref 39.0–52.0)
Hemoglobin: 15.7 g/dL (ref 13.0–17.0)
MCH: 32.2 pg (ref 26.0–34.0)
MCHC: 33.5 g/dL (ref 30.0–36.0)
MCV: 96.1 fL (ref 80.0–100.0)
Platelets: 228 10*3/uL (ref 150–400)
RBC: 4.87 MIL/uL (ref 4.22–5.81)
RDW: 14.7 % (ref 11.5–15.5)
WBC: 10.6 10*3/uL — ABNORMAL HIGH (ref 4.0–10.5)
nRBC: 0 % (ref 0.0–0.2)

## 2018-11-25 LAB — BASIC METABOLIC PANEL
Anion gap: 11 (ref 5–15)
BUN: 63 mg/dL — ABNORMAL HIGH (ref 8–23)
CO2: 28 mmol/L (ref 22–32)
Calcium: 9.3 mg/dL (ref 8.9–10.3)
Chloride: 97 mmol/L — ABNORMAL LOW (ref 98–111)
Creatinine, Ser: 1.74 mg/dL — ABNORMAL HIGH (ref 0.61–1.24)
GFR calc Af Amer: 42 mL/min — ABNORMAL LOW (ref >60)
GFR calc non Af Amer: 36 mL/min — ABNORMAL LOW (ref >60)
Glucose, Bld: 148 mg/dL — ABNORMAL HIGH (ref 70–99)
Potassium: 4.1 mmol/L (ref 3.5–5.1)
Sodium: 136 mmol/L (ref 135–145)

## 2018-11-25 SURGERY — BIV UPGRADE

## 2018-11-25 MED ORDER — HEPARIN (PORCINE) IN NACL 1000-0.9 UT/500ML-% IV SOLN
INTRAVENOUS | Status: AC
  Filled 2018-11-25: qty 500

## 2018-11-25 MED ORDER — ACETAMINOPHEN 325 MG PO TABS: 325.0000 mg | ORAL_TABLET | ORAL | Status: DC | PRN

## 2018-11-25 MED ORDER — LIDOCAINE HCL (PF) 1 % IJ SOLN
INTRAMUSCULAR | Status: DC | PRN
  Administered 2018-11-25: 60 mL

## 2018-11-25 MED ORDER — SODIUM CHLORIDE 0.9% FLUSH
3.0000 mL | Freq: Two times a day (BID) | INTRAVENOUS | Status: DC
  Administered 2018-11-25 – 2018-11-26 (×3): 3 mL via INTRAVENOUS

## 2018-11-25 MED ORDER — HEPARIN (PORCINE) IN NACL 1000-0.9 UT/500ML-% IV SOLN
INTRAVENOUS | Status: DC | PRN
  Administered 2018-11-25: 500 mL

## 2018-11-25 MED ORDER — FENTANYL CITRATE (PF) 100 MCG/2ML IJ SOLN
INTRAMUSCULAR | Status: AC
  Filled 2018-11-25: qty 2

## 2018-11-25 MED ORDER — HYDROCODONE-ACETAMINOPHEN 5-325 MG PO TABS
1.0000 | ORAL_TABLET | ORAL | Status: DC | PRN
  Administered 2018-11-25 – 2018-11-26 (×2): 1 via ORAL
  Filled 2018-11-25 (×2): qty 1

## 2018-11-25 MED ORDER — SODIUM CHLORIDE 0.9% FLUSH: 3.0000 mL | INTRAVENOUS | Status: DC | PRN

## 2018-11-25 MED ORDER — ONDANSETRON HCL 4 MG/2ML IJ SOLN: 4.0000 mg | Freq: Four times a day (QID) | INTRAMUSCULAR | Status: DC | PRN

## 2018-11-25 MED ORDER — SODIUM CHLORIDE 0.9 % IV SOLN
INTRAVENOUS | Status: AC
  Filled 2018-11-25: qty 2

## 2018-11-25 MED ORDER — SODIUM CHLORIDE 0.9 % IV SOLN: 250.0000 mL | INTRAVENOUS | Status: DC | PRN

## 2018-11-25 MED ORDER — CEFAZOLIN SODIUM-DEXTROSE 2-4 GM/100ML-% IV SOLN
INTRAVENOUS | Status: AC
  Filled 2018-11-25: qty 100

## 2018-11-25 MED ORDER — CEFAZOLIN SODIUM-DEXTROSE 1-4 GM/50ML-% IV SOLN
1.0000 g | Freq: Four times a day (QID) | INTRAVENOUS | Status: AC
  Administered 2018-11-25 – 2018-11-26 (×3): 1 g via INTRAVENOUS
  Filled 2018-11-25 (×3): qty 50

## 2018-11-25 MED ORDER — LIDOCAINE HCL (PF) 1 % IJ SOLN
INTRAMUSCULAR | Status: AC
  Filled 2018-11-25: qty 60

## 2018-11-25 MED ORDER — IOHEXOL 350 MG/ML SOLN
INTRAVENOUS | Status: DC | PRN
  Administered 2018-11-25 (×2): 15 mL via INTRAVENOUS

## 2018-11-25 MED ORDER — FENTANYL CITRATE (PF) 100 MCG/2ML IJ SOLN
INTRAMUSCULAR | Status: DC | PRN
  Administered 2018-11-25: 12.5 ug via INTRAVENOUS

## 2018-11-25 SURGICAL SUPPLY — 16 items
ADAPTER SEALING SSA-EW-09 (MISCELLANEOUS) ×3 IMPLANT
CABLE SURGICAL S-101-97-12 (CABLE) ×3 IMPLANT
CATH ATTAIN COM SURV 6250V-MB2 (CATHETERS) ×3 IMPLANT
CATH HEX JOS 2-5-2 65CM 6F REP (CATHETERS) ×3 IMPLANT
DEVICE DISSECT PLASMABLAD 3.0S (MISCELLANEOUS) ×1 IMPLANT
KIT MICROPUNCTURE NIT STIFF (SHEATH) ×3 IMPLANT
LEAD QUARTET 1458Q-86CM (Lead) ×3 IMPLANT
PACEMAKER QUDR ALLR CRT PM3562 (Pacemaker) ×1 IMPLANT
PAD PRO RADIOLUCENT 2001M-C (PAD) ×3 IMPLANT
PLASMABLADE 3.0S (MISCELLANEOUS) ×3
PMKR QUADRA ALLURE CRT PM3562 (Pacemaker) ×3 IMPLANT
SHEATH CLASSIC 9.5F (SHEATH) ×3 IMPLANT
SHEATH PINNACLE 6F 10CM (SHEATH) ×3 IMPLANT
SLITTER 6232ADJ (MISCELLANEOUS) ×3 IMPLANT
TRAY PACEMAKER INSERTION (PACKS) ×3 IMPLANT
WIRE ACUITY WHISPER EDS 4648 (WIRE) ×3 IMPLANT

## 2018-11-25 NOTE — H&P (View-Only) (Signed)
ELECTROPHYSIOLOGY CONSULT NOTE    Patient ID: Ricardo Hunt MRN: [id number], DOB/AGE: 80/05/1940 80 y.o.  Admit date: 11/16/2018 Date of Consult: 11/25/2018  Primary Physician: Dione Housekeeper, MD Primary Cardiologist: Aundra Dubin Electrophysiologist: Haset Oaxaca  Patient Profile: Ricardo Hunt is a 78 y.o. male with a history of Mobitz II s/p STJ dual chamber PPM, chronic systolic heart failure, HTN, atrial fibrillation who is being seen today for the evaluation of CRT upgrade at the request of Dr Aundra Dubin.  HPI:  Ricardo Hunt is a 78 y.o. male with the above past medical history. He was seen by Dr [name] on 10/31/18 via telehealth visit and CRTP upgrade was recommended at that time. He unfortunately developed worsening HF symptoms and was admitted on 8/16 with HF exacerbation. He has been diuresed and is feeling much improved since admission. EP has been asked to consider CRT upgrade while patient is admitted.   Echo this admission demonstrated EF 20%, moderate AS, akinesis of the left ventricular, entire inferior wall. There is akinesis of the left ventricular, mid-apical anteroseptal wall and apical segment.  He denies chest pain, palpitations, dyspnea, PND, orthopnea, nausea, vomiting, dizziness, syncope, edema, weight gain, or early satiety.  Past Medical History:  Diagnosis Date  . Arthritis    " all over the body"  . Atrial fibrillation and flutter ([place])    seen on PPM interrogation; Rivaroxaban started 04/2018  . Chronic systolic CHF (congestive heart failure) ([place])    Echo 05/2018: EF 20-25, severe LVE, diffuse HK with septal and apical akinesis, moderate LAE, moderate MAC, severe aortic valve calcification - degree of AS diff to judge with low EF (likely mild to mod)  . Coronary artery disease    s/p CABG 1996 // LHC 05/2018: oLAD 90, pLAD 100 after Dx, LCx 90 at bifurcation of OM1/OM2, mRCA 50; L-LAD ok; S-D1/OM2 100 >> PCI: DES to mLCx and orbital atherectomy and DES to oLAD  . GERD  (gastroesophageal reflux disease)   . Hyperlipidemia   . Hypertension   . Mobitz (type) II atrioventricular block    s/p STJ Accent pacemaker implanted by Dr [name] 10-16-2012  . Myocardial infarction [facility])    per pt., told that that the stress test shows that there is a weakening evident on the stress test  . Pacemaker 10/16/2012   St. Jude   . Personal history of colonic polyps-tubular adenomas 08/31/2013  . Vertigo      Surgical History:  Past Surgical History:  Procedure Laterality Date  . cardiac bypass  1996   3 vessels  . CATARACT EXTRACTION W/ INTRAOCULAR LENS  IMPLANT, BILATERAL Bilateral 2010  . COLONOSCOPY    . CORONARY ARTERY BYPASS GRAFT    . CORONARY ATHERECTOMY N/A 05/23/2018   Procedure: CORONARY ATHERECTOMY;  Surgeon: [name], Peter M, MD;  Location: [place] CV LAB;  Service: Cardiovascular;  Laterality: N/A;  . CORONARY STENT INTERVENTION N/A 05/23/2018   Procedure: CORONARY STENT INTERVENTION;  Surgeon: [name], Peter M, MD;  Location: [place] CV [facility];  Service: Cardiovascular;  Laterality: N/A;  . INGUINAL HERNIA REPAIR Left 2010  . LUMBAR LAMINECTOMY/DECOMPRESSION MICRODISCECTOMY Right 07/19/2016   Procedure: Right Lumbar Four-Five Hemilaminectomy and Bilateral Lumbar Two-Three Laminectomy;  Surgeon: Eustace Moore, MD;  Location: [place];  Service: Neurosurgery;  Laterality: Right;  Right Lumbar Four-Five Hemilaminectomy and Bilateral Lumbar Two-Three Laminectomy  . LUMBAR SPINE SURGERY  2006  . NASAL SEPTUM SURGERY  09/01/13   Dr. Adriana Reams  . PACEMAKER INSERTION  10-16-2012   STJ Accent dual chamber pacemaker implanted by Dr [name] for symptomatic Mobitz II heart block  . PERMANENT PACEMAKER INSERTION N/A 10/16/2012   Procedure: PERMANENT PACEMAKER INSERTION;  Surgeon: Thompson Grayer, MD;  Location[facility] C[facility] LAB;  Service: Cardiovascular;  Laterality: N/A;  . RIGHT/LEFT HEART CATH AND CORONARY/GRAFT ANGIOGRAPHY N/A 05/20/2018   Procedure: RIGHT/LEFT HEART CATH AND  CORONARY/GRAFT ANGIOGRAPHY;  Surgeon: [name], Peter M, MD;  Location: [place] CV LAB;  Service: Cardiovascular;  Laterality: N/A;     Medications Prior to Admission  Medication Sig Dispense Refill Last Dose  . acetaminophen (TYLENOL) 500 MG tablet Take 1,000 mg by mouth 2 (two) times daily as needed (for leg pain/pain).    unk  . aspirin EC 81 MG tablet Take 81 mg by mouth daily.   11/16/2018 at Unknown time  . carvedilol (COREG) 12.5 MG tablet Take 1 tablet (12.5 mg total) by mouth 2 (two) times daily with a meal. 180 tablet 3 11/16/2018 at 0800  . clopidogrel (PLAVIX) 75 MG tablet Take 1 tablet (75 mg total) by mouth daily. 30 tablet 11 11/16/2018 at 0800  . Doxylamine Succinate, Sleep, (SLEEP AID PO) Take 1 tablet by mouth at bedtime as needed (for sleep).   unk  . furosemide (LASIX) 40 MG tablet Take 1 tablet (40 mg total) by mouth 2 (two) times daily. 180 tablet 0 11/16/2018 at Unknown time  . meloxicam (MOBIC) 7.5 MG tablet Take 7.5 mg by mouth daily as needed for pain.    unk  . Omega-3 Fatty Acids (FISH OIL) 1000 MG CAPS Take 2,000 mg by mouth daily.    11/16/2018 at Unknown time  . potassium chloride SA (K-DUR,KLOR-CON) 20 MEQ tablet Take 1 tablet (20 mEq total) by mouth daily. 7 tablet 0 11/16/2018 at Unknown time  . rosuvastatin (CRESTOR) 20 MG tablet Take 1 tablet (20 mg total) by mouth daily at 6 PM. 90 tablet 3 11/15/2018  . spironolactone (ALDACTONE) 25 MG tablet Take 0.5 tablets (12.5 mg total) by mouth daily. 15 tablet 11 11/16/2018 at Unknown time  . rivaroxaban (XARELTO) 20 MG TABS tablet Take 1 tablet (20 mg total) by mouth daily with supper. (Patient not taking: Reported on 11/17/2018) 30 tablet 0 Not Taking at Unknown time  . sacubitril-valsartan (ENTRESTO) 24-26 MG Take 1 tablet by mouth 2 (two) times daily. (Patient not taking: Reported on 11/17/2018) 60 tablet 11 Not Taking at Unknown time    Inpatient Medications:  . carvedilol  3.125 mg Oral BID WC  . clopidogrel  75 mg  Oral Daily  . digoxin  0.125 mg Oral Daily  . gentamicin irrigation  80 mg Irrigation To Cath  . rivaroxaban  15 mg Oral Q supper  . rosuvastatin  20 mg Oral q1800  . sodium chloride flush  10-40 mL Intracatheter Q12H  . spironolactone  12.5 mg Oral Daily    Allergies: No Known Allergies  Social History   Socioeconomic History  . Marital status: Widowed    Spouse name: Not on file  . Number of children: Not on file  . Years of education: Not on file  . Highest education level: Not on file  Occupational History  . Not on file  Social Needs  . Financial resource strain: Not on file  . Food insecurity    Worry: Not on file    Inability: Not on file  . Transportation needs    Medical: Not on file    Non-medical: Not on  file  Tobacco Use  . Smoking status: Former Smoker    Packs/day: 2.00    Years: 40.00    Pack years: 80.00    Types: Cigarettes    Quit date: 04/02/1994    Years since quitting: 24.6  . Smokeless tobacco: Never Used  Substance and Sexual Activity  . Alcohol use: Not Currently    Alcohol/week: 0.0 standard drinks  . Drug use: No  . Sexual activity: Not Currently  Lifestyle  . Physical activity    Days per week: Not on file    Minutes per session: Not on file  . Stress: Not on file  Relationships  . Social connections    Talks on phone: Not on file    Gets together: Not on file    Attends religious service: Not on file    Active member of club or organization: Not on file    Attends meetings of clubs or organizations: Not on file    Relationship status: Not on file  . Intimate partner violence    Fear of current or ex partner: Not on file    Emotionally abused: Not on file    Physically abused: Not on file    Forced sexual activity: Not on file  Other Topics Concern  . Not on file  Social History Narrative  . Not on file     Family History  Problem Relation Age of Onset  . CAD Father   . Hypertension Father   . CAD Mother   . Diabetes  Mother   . Breast cancer Other        Niece  . Colon cancer Neg Hx   . Pancreatic cancer Neg Hx   . Stomach cancer Neg Hx      Review of Systems: All other systems reviewed and are otherwise negative except as noted above.  Physical Exam: Vitals:   11/25/18 0707 11/25/18 0849 11/25/18 0850 11/25/18 0855  BP: 118/71  93/64 95/70  Pulse:  84    Resp:      Temp:      TempSrc:      SpO2:      Weight:      Height:        GEN- The patient is well appearing, alert and oriented x 3 today.   HEENT: normocephalic, atraumatic; sclera clear, conjunctiva pink; hearing intact; oropharynx clear; neck supple Lungs- Clear to ausculation bilaterally, normal work of breathing.  No wheezes, rales, rhonchi Heart- Regular rate and rhythm, no murmurs, rubs or gallops GI- soft, non-tender, non-distended, bowel sounds present Extremities- no clubbing, cyanosis, or edema; DP/PT/radial pulses 2+ bilaterally MS- no significant deformity or atrophy Skin- warm and dry, no rash or lesion Psych- euthymic mood, full affect Neuro- strength and sensation are intact  Labs:   Lab Results  Component Value Date   WBC 10.6 (H) 11/25/2018   HGB 15.7 11/25/2018   HCT 46.8 11/25/2018   MCV 96.1 11/25/2018   PLT 228 11/25/2018    Recent Labs  Lab 11/25/18 0335  NA 136  K 4.1  CL 97*  CO2 28  BUN 63*  CREATININE 1.74*  CALCIUM 9.3  GLUCOSE 148*      Radiology/Studies: Dg Chest 2 View  Result Date: 11/16/2018 CLINICAL DATA:  Shortness of breath. EXAM: CHEST - 2 VIEW COMPARISON:  October 16, 2016. FINDINGS: The heart size is enlarged. There is volume overload with mild developing pulmonary edema. There is a dual chamber left-sided pacemaker. The patient   is status post prior median sternotomy. Aortic calcifications are noted. There are degenerative changes throughout the thoracic spine. There is no pneumothorax. IMPRESSION: Cardiomegaly with vascular congestion and possible early developing pulmonary  edema. Electronically Signed   By: Constance Holster M.D.   On: 11/16/2018 21:56   [place] Ekg Site Rite  Result Date: 11/21/2018 If Site Rite image not attached, placement could not be confirmed due to current cardiac rhythm.   VXY:IAXK difficult baseline, likely AV pacing.  EKG from 11/17/18 SR with normal intervals - I am not sure this is on the correct patient (personally reviewed)  TELEMETRY: AV pacing (personally reviewed)  DEVICE HISTORY: STJ dual chamber ICD   Assessment/Plan: 1.  Mobitz II/CHB The patient RV paces 92% of the time by last device interrogation Normal device function  2.  Chronic systolic heart failure/ICM EF remains depressed despite medical therapy. Risks, benefits to CRTP upgrade reviewed again with patient today who wishes to proceed.  At visit 10/31/18 with patient and daughter, they were clear that they preferred CRTP upgrade in a shared decision making process.  The patient understands that the risks include but are not limited to bleeding, infection, pneumothorax, perforation, tamponade, vascular damage, renal failure, MI, stroke, death, inappropriate shocks, and lead dislodgement and wishes to proceed.  We will therefore schedule device implantation at the next available time.  3.  CAD Stable No change required today  4.  Persistent atrial fibrillation Continue Xarelto long term for CHADS2VASC of 5      For questions or updates, please contact [place] Please consult [email or web address] for contact info under Cardiology/STEMI.  Signed, Chanetta Marshall, NP 11/25/2018 9:26 AM   I have seen, examined the patient, and reviewed the above assessment and plan.  Changes to above are made where necessary.  On exam, RRR.  Pt with worsening CHF in the setting of chronic RV pacing due to complete heart block.  I would advise device upgrade at this time to CRT.  We discussed pros and cons of ICD therapy.  He is very clear that he would prefer CRT-P over CRT-D.  Given  advanced age, I think that this is appropriate.  Risks, benefits, and alternatives to upgrade to CRT-P were discussed in detail today.  The patient understands that risks include but are not limited to bleeding, infection, pneumothorax, perforation, tamponade, vascular damage, renal failure, MI, stroke, death, damage to his existing leads, and lead dislodgement and wishes to proceed.  We will therefore schedule the procedure for later today.  Co Sign: Thompson Grayer, MD 11/25/2018 10:47 AM

## 2018-11-25 NOTE — Progress Notes (Signed)
Orthopedic Tech Progress Note Patient Details:  Ricardo Hunt Sep 26, 1940 [id number] RN said patient has on arm sling Patient ID: [name], male   DOB: 16-Nov-1940, 78 y.o.   MRN: [id number]   [name] 11/25/2018, 5:44 PM

## 2018-11-25 NOTE — Consult Note (Addendum)
ELECTROPHYSIOLOGY CONSULT NOTE    Patient ID: Ricardo Hunt MRN: [id number], DOB/AGE: 80/05/1940 80 y.o.  Admit date: 11/16/2018 Date of Consult: 11/25/2018  Primary Physician: Dione Housekeeper, MD Primary Cardiologist: Aundra Dubin Electrophysiologist: Daryel Kenneth  Patient Profile: Ricardo Hunt is a 78 y.o. male with a history of Mobitz II s/p STJ dual chamber PPM, chronic systolic heart failure, HTN, atrial fibrillation who is being seen today for the evaluation of CRT upgrade at the request of Dr Aundra Dubin.  HPI:  Ricardo Hunt is a 78 y.o. male with the above past medical history. He was seen by Dr [name] on 10/31/18 via telehealth visit and CRTP upgrade was recommended at that time. He unfortunately developed worsening HF symptoms and was admitted on 8/16 with HF exacerbation. He has been diuresed and is feeling much improved since admission. EP has been asked to consider CRT upgrade while patient is admitted.   Echo this admission demonstrated EF 20%, moderate AS, akinesis of the left ventricular, entire inferior wall. There is akinesis of the left ventricular, mid-apical anteroseptal wall and apical segment.  He denies chest pain, palpitations, dyspnea, PND, orthopnea, nausea, vomiting, dizziness, syncope, edema, weight gain, or early satiety.  Past Medical History:  Diagnosis Date  . Arthritis    " all over the body"  . Atrial fibrillation and flutter ([place])    seen on PPM interrogation; Rivaroxaban started 04/2018  . Chronic systolic CHF (congestive heart failure) ([place])    Echo 05/2018: EF 20-25, severe LVE, diffuse HK with septal and apical akinesis, moderate LAE, moderate MAC, severe aortic valve calcification - degree of AS diff to judge with low EF (likely mild to mod)  . Coronary artery disease    s/p CABG 1996 // LHC 05/2018: oLAD 90, pLAD 100 after Dx, LCx 90 at bifurcation of OM1/OM2, mRCA 50; L-LAD ok; S-D1/OM2 100 >> PCI: DES to mLCx and orbital atherectomy and DES to oLAD  . GERD  (gastroesophageal reflux disease)   . Hyperlipidemia   . Hypertension   . Mobitz (type) II atrioventricular block    s/p STJ Accent pacemaker implanted by Dr [name] 10-16-2012  . Myocardial infarction [facility])    per pt., told that that the stress test shows that there is a weakening evident on the stress test  . Pacemaker 10/16/2012   St. Jude   . Personal history of colonic polyps-tubular adenomas 08/31/2013  . Vertigo      Surgical History:  Past Surgical History:  Procedure Laterality Date  . cardiac bypass  1996   3 vessels  . CATARACT EXTRACTION W/ INTRAOCULAR LENS  IMPLANT, BILATERAL Bilateral 2010  . COLONOSCOPY    . CORONARY ARTERY BYPASS GRAFT    . CORONARY ATHERECTOMY N/A 05/23/2018   Procedure: CORONARY ATHERECTOMY;  Surgeon: [name], Peter M, MD;  Location: [place] CV LAB;  Service: Cardiovascular;  Laterality: N/A;  . CORONARY STENT INTERVENTION N/A 05/23/2018   Procedure: CORONARY STENT INTERVENTION;  Surgeon: [name], Peter M, MD;  Location: [place] CV LAB;  Service: Cardiovascular;  Laterality: N/A;  . INGUINAL HERNIA REPAIR Left 2010  . LUMBAR LAMINECTOMY/DECOMPRESSION MICRODISCECTOMY Right 07/19/2016   Procedure: Right Lumbar Four-Five Hemilaminectomy and Bilateral Lumbar Two-Three Laminectomy;  Surgeon: Eustace Moore, MD;  Location: [place];  Service: Neurosurgery;  Laterality: Right;  Right Lumbar Four-Five Hemilaminectomy and Bilateral Lumbar Two-Three Laminectomy  . LUMBAR SPINE SURGERY  2006  . NASAL SEPTUM SURGERY  09/01/13   Dr. Adriana Reams  . PACEMAKER INSERTION  10-16-2012   STJ Accent dual chamber pacemaker implanted by Dr [name] for symptomatic Mobitz II heart block  . PERMANENT PACEMAKER INSERTION N/A 10/16/2012   Procedure: PERMANENT PACEMAKER INSERTION;  Surgeon: Thompson Grayer, MD;  Location[facility] CATH LAB;  Service: Cardiovascular;  Laterality: N/A;  . RIGHT/LEFT HEART CATH AND CORONARY/GRAFT ANGIOGRAPHY N/A 05/20/2018   Procedure: RIGHT/LEFT HEART CATH AND  CORONARY/GRAFT ANGIOGRAPHY;  Surgeon: [name], Peter M, MD;  Location: [place] CV LAB;  Service: Cardiovascular;  Laterality: N/A;     Medications Prior to Admission  Medication Sig Dispense Refill Last Dose  . acetaminophen (TYLENOL) 500 MG tablet Take 1,000 mg by mouth 2 (two) times daily as needed (for leg pain/pain).    unk  . aspirin EC 81 MG tablet Take 81 mg by mouth daily.   11/16/2018 at Unknown time  . carvedilol (COREG) 12.5 MG tablet Take 1 tablet (12.5 mg total) by mouth 2 (two) times daily with a meal. 180 tablet 3 11/16/2018 at 0800  . clopidogrel (PLAVIX) 75 MG tablet Take 1 tablet (75 mg total) by mouth daily. 30 tablet 11 11/16/2018 at 0800  . Doxylamine Succinate, Sleep, (SLEEP AID PO) Take 1 tablet by mouth at bedtime as needed (for sleep).   unk  . furosemide (LASIX) 40 MG tablet Take 1 tablet (40 mg total) by mouth 2 (two) times daily. 180 tablet 0 11/16/2018 at Unknown time  . meloxicam (MOBIC) 7.5 MG tablet Take 7.5 mg by mouth daily as needed for pain.    unk  . Omega-3 Fatty Acids (FISH OIL) 1000 MG CAPS Take 2,000 mg by mouth daily.    11/16/2018 at Unknown time  . potassium chloride SA (K-DUR,KLOR-CON) 20 MEQ tablet Take 1 tablet (20 mEq total) by mouth daily. 7 tablet 0 11/16/2018 at Unknown time  . rosuvastatin (CRESTOR) 20 MG tablet Take 1 tablet (20 mg total) by mouth daily at 6 PM. 90 tablet 3 11/15/2018  . spironolactone (ALDACTONE) 25 MG tablet Take 0.5 tablets (12.5 mg total) by mouth daily. 15 tablet 11 11/16/2018 at Unknown time  . rivaroxaban (XARELTO) 20 MG TABS tablet Take 1 tablet (20 mg total) by mouth daily with supper. (Patient not taking: Reported on 11/17/2018) 30 tablet 0 Not Taking at Unknown time  . sacubitril-valsartan (ENTRESTO) 24-26 MG Take 1 tablet by mouth 2 (two) times daily. (Patient not taking: Reported on 11/17/2018) 60 tablet 11 Not Taking at Unknown time    Inpatient Medications:  . carvedilol  3.125 mg Oral BID WC  . clopidogrel  75 mg  Oral Daily  . digoxin  0.125 mg Oral Daily  . gentamicin irrigation  80 mg Irrigation To Cath  . rivaroxaban  15 mg Oral Q supper  . rosuvastatin  20 mg Oral q1800  . sodium chloride flush  10-40 mL Intracatheter Q12H  . spironolactone  12.5 mg Oral Daily    Allergies: No Known Allergies  Social History   Socioeconomic History  . Marital status: Widowed    Spouse name: Not on file  . Number of children: Not on file  . Years of education: Not on file  . Highest education level: Not on file  Occupational History  . Not on file  Social Needs  . Financial resource strain: Not on file  . Food insecurity    Worry: Not on file    Inability: Not on file  . Transportation needs    Medical: Not on file    Non-medical: Not on  file  Tobacco Use  . Smoking status: Former Smoker    Packs/day: 2.00    Years: 40.00    Pack years: 80.00    Types: Cigarettes    Quit date: 04/02/1994    Years since quitting: 24.6  . Smokeless tobacco: Never Used  Substance and Sexual Activity  . Alcohol use: Not Currently    Alcohol/week: 0.0 standard drinks  . Drug use: No  . Sexual activity: Not Currently  Lifestyle  . Physical activity    Days per week: Not on file    Minutes per session: Not on file  . Stress: Not on file  Relationships  . Social Herbalist on phone: Not on file    Gets together: Not on file    Attends religious service: Not on file    Active member of club or organization: Not on file    Attends meetings of clubs or organizations: Not on file    Relationship status: Not on file  . Intimate partner violence    Fear of current or ex partner: Not on file    Emotionally abused: Not on file    Physically abused: Not on file    Forced sexual activity: Not on file  Other Topics Concern  . Not on file  Social History Narrative  . Not on file     Family History  Problem Relation Age of Onset  . CAD Father   . Hypertension Father   . CAD Mother   . Diabetes  Mother   . Breast cancer Other        Niece  . Colon cancer Neg Hx   . Pancreatic cancer Neg Hx   . Stomach cancer Neg Hx      Review of Systems: All other systems reviewed and are otherwise negative except as noted above.  Physical Exam: Vitals:   11/25/18 0707 11/25/18 0849 11/25/18 0850 11/25/18 0855  BP: 118/71  93/64 95/70  Pulse:  84    Resp:      Temp:      TempSrc:      SpO2:      Weight:      Height:        GEN- The patient is well appearing, alert and oriented x 3 today.   HEENT: normocephalic, atraumatic; sclera clear, conjunctiva pink; hearing intact; oropharynx clear; neck supple Lungs- Clear to ausculation bilaterally, normal work of breathing.  No wheezes, rales, rhonchi Heart- Regular rate and rhythm, no murmurs, rubs or gallops GI- soft, non-tender, non-distended, bowel sounds present Extremities- no clubbing, cyanosis, or edema; DP/PT/radial pulses 2+ bilaterally MS- no significant deformity or atrophy Skin- warm and dry, no rash or lesion Psych- euthymic mood, full affect Neuro- strength and sensation are intact  Labs:   Lab Results  Component Value Date   WBC 10.6 (H) 11/25/2018   HGB 15.7 11/25/2018   HCT 46.8 11/25/2018   MCV 96.1 11/25/2018   PLT 228 11/25/2018    Recent Labs  Lab 11/25/18 0335  NA 136  K 4.1  CL 97*  CO2 28  BUN 63*  CREATININE 1.74*  CALCIUM 9.3  GLUCOSE 148*      Radiology/Studies: Dg Chest 2 View  Result Date: 11/16/2018 CLINICAL DATA:  Shortness of breath. EXAM: CHEST - 2 VIEW COMPARISON:  October 16, 2016. FINDINGS: The heart size is enlarged. There is volume overload with mild developing pulmonary edema. There is a dual chamber left-sided pacemaker. The patient  is status post prior median sternotomy. Aortic calcifications are noted. There are degenerative changes throughout the thoracic spine. There is no pneumothorax. IMPRESSION: Cardiomegaly with vascular congestion and possible early developing pulmonary  edema. Electronically Signed   By: Constance Holster M.D.   On: 11/16/2018 21:56   [place] Ekg Site Rite  Result Date: 11/21/2018 If Site Rite image not attached, placement could not be confirmed due to current cardiac rhythm.   VXY:IAXK difficult baseline, likely AV pacing.  EKG from 11/17/18 SR with normal intervals - I am not sure this is on the correct patient (personally reviewed)  TELEMETRY: AV pacing (personally reviewed)  DEVICE HISTORY: STJ dual chamber ICD   Assessment/Plan: 1.  Mobitz II/CHB The patient RV paces 92% of the time by last device interrogation Normal device function  2.  Chronic systolic heart failure/ICM EF remains depressed despite medical therapy. Risks, benefits to CRTP upgrade reviewed again with patient today who wishes to proceed.  At visit 10/31/18 with patient and daughter, they were clear that they preferred CRTP upgrade in a shared decision making process.  The patient understands that the risks include but are not limited to bleeding, infection, pneumothorax, perforation, tamponade, vascular damage, renal failure, MI, stroke, death, inappropriate shocks, and lead dislodgement and wishes to proceed.  We will therefore schedule device implantation at the next available time.  3.  CAD Stable No change required today  4.  Persistent atrial fibrillation Continue Xarelto long term for CHADS2VASC of 5      For questions or updates, please contact [place] Please consult [email or web address] for contact info under Cardiology/STEMI.  Signed, Chanetta Marshall, NP 11/25/2018 9:26 AM   I have seen, examined the patient, and reviewed the above assessment and plan.  Changes to above are made where necessary.  On exam, RRR.  Pt with worsening CHF in the setting of chronic RV pacing due to complete heart block.  I would advise device upgrade at this time to CRT.  We discussed pros and cons of ICD therapy.  He is very clear that he would prefer CRT-P over CRT-D.  Given  advanced age, I think that this is appropriate.  Risks, benefits, and alternatives to upgrade to CRT-P were discussed in detail today.  The patient understands that risks include but are not limited to bleeding, infection, pneumothorax, perforation, tamponade, vascular damage, renal failure, MI, stroke, death, damage to his existing leads, and lead dislodgement and wishes to proceed.  We will therefore schedule the procedure for later today.  Co Sign: Thompson Grayer, MD 11/25/2018 10:47 AM

## 2018-11-25 NOTE — Progress Notes (Signed)
CARDIAC REHAB PHASE I   PRE:  Rate/Rhythm: 77 paced  BP:  Supine: 107/75  Sitting:   Standing:    SaO2: 95%RA  MODE:  Ambulation: 470 ft   POST:  Rate/Rhythm: 99- 115 paced  BP:  Supine: 110/79  Sitting:   Standing:    SaO2: 95%RA 0941-1005 Pt walked 470 ft on RA with rolling walker with steady gait. No c/o dizziness or knee pain. Back to bed after walk. For upgrade today.   Graylon Good, RN BSN  11/25/2018 10:02 AM

## 2018-11-25 NOTE — Interval H&P Note (Signed)
History and Physical Interval Note:  11/25/2018 12:51 PM  Ricardo Hunt  has presented today for surgery, with the diagnosis of heart failure.  The various methods of treatment have been discussed with the patient and family. After consideration of risks, benefits and other options for treatment, the patient has consented to  Procedure(s): BIV PACEMAKER INSERTION CRT-P (N/A) as a surgical intervention.  The patient's history has been reviewed, patient examined, no change in status, stable for surgery.  I have reviewed the patient's chart and labs.  Questions were answered to the patient's satisfaction.     Thompson Grayer

## 2018-11-25 NOTE — Progress Notes (Signed)
PROGRESS NOTE  Ricardo Hunt [id number] DOB: 1940-05-23 DOA: 11/16/2018 PCP: Dione Housekeeper, MD  Brief History   Ricardo Hunt a 78 y.o.malewith medical history significant forcoronary artery disease status postremoteCABGand then 2 stents in February 2020, atrial fibrillation on Xarelto, complete heart block with pacer, and ischemic cardiomyopathy with EF 20% in June, now presenting to the emergency department for insidiously worsening dyspnea. Patient reports that he has had some shortness of breath for the past 3 months or so, but has worsened significantly over the past couple days.He reports some bilateral ankle swelling, but does not feel that this has changed much recently. He denied chest pain, fevers, chills, or cough.Patient believes his weight has been fairly stable. He reports eating a lot of salty foods, but states that he always has and there has not been any increase in salt or fluid intake. States that there has not been any medication changes since February and he has continued to be adherent tohis regimen.  ED Course:Upon arrival to the ED, patient is found to be afebrile, saturating mid 90s on room air, slightly tachypneic, and with stable blood pressure. EKG features a sinus rhythm with PVCs, fusion complexes, and LBBB. Chest x-ray is notable for cardiomegaly and vascular congestion with concern for early pulmonary edema. Chemistry panel is notable for a creatinine 1.37, similar to priors. CBC is unremarkable. Troponin is mildly elevated and BNP is elevated to 784 which is higher than priors. Patient was given 40 mg IV Lasix in the emergency department a couple hours ago, but has not urinated much and remains dyspneic at rest, only able to ambulate a few steps without beginning to develop distress, and he will be observed for ongoing evaluation and management.  The patient was admitted to a telemetry bed. He was diuresed, but has been de-escalated to oral  lasix 40 mg bid due to a bump in creatinine and hypotension. Beta blocker and lasix doses have been held due to hypotension as has entresto. Heart failure team has been consulted. PICC has been placed and CVP is being monitored.  Echocardiogram was performed on 11/23/2018. It demonstrated an EF of 123456 with diastolic dysfunction, akinesis of the inferior wall, mid-apical anterior wall, and apex.   EP Cardiology plans to upgrade the patient's pacemaker later today to improve his cardiac function.  Consultants  . Heart Failure Team  Procedures  . None  Antibiotics   Anti-infectives (From admission, onward)   Start     Dose/Rate Route Frequency Ordered Stop   11/25/18 1400  gentamicin (GARAMYCIN) 80 mg in sodium chloride 0.9 % 500 mL irrigation     80 mg Irrigation To Cath Lab 11/24/18 1254 11/26/18 1400   11/25/18 1400  ceFAZolin (ANCEF) IVPB 2g/100 mL premix     2 g 200 mL/hr over 30 Minutes Intravenous To Cath Lab 11/24/18 1254 11/25/18 1410     Subjective  The patient is resting comfortably. No new complaints.  Objective   Vitals:  Vitals:   11/25/18 0855 11/25/18 1336  BP: 95/70   Pulse:    Resp:    Temp:    SpO2: 97% 99%    Exam:  Constitutional:  . The patient is awake, alert, and oriented x 3. No acute distress. Respiratory:  . No increased work of breathing. . No wheezes, rales, or rhonchi . No tactile fremitus. Cardiovascular:  . Regular rate and rhythm. . No murmurs, ectopy, or gallups.  . No lateral PMI. No thrills. Abdomen:  .  Abdomen is soft, non-tender, non-distended. . No hernias, masses, or hernias are appreciated. . Normoactive bowel sounds. Musculoskeletal:  . No cyanosis, clubbing, or edema. Skin:  . No rashes, lesions, ulcers . palpation of skin: no induration or nodules Neurologic:  . CN 2-12 intact . Sensation all 4 extremities intact Psychiatric:  . Mental status o Mood, affect appropriate o Orientation to person, place, time  .  judgment and insight appear intact   I have personally reviewed the following:   Today's Data  . Vitals, BMP  Cardiology Data  . Echo 09/2018 EF 20% with global hypokinesis. Marland Kitchen Echo 123XX123 EF 123456, Diastolic dysfunction, akinesis of the inferior wall, and mid-apical anterior wall and apex.  Scheduled Meds: . [MAR Hold] carvedilol  3.125 mg Oral BID WC  . [MAR Hold] clopidogrel  75 mg Oral Daily  . [MAR Hold] digoxin  0.125 mg Oral Daily  . gentamicin irrigation  80 mg Irrigation To Cath  . [MAR Hold] rivaroxaban  15 mg Oral Q supper  . [MAR Hold] rosuvastatin  20 mg Oral q1800  . [MAR Hold] sodium chloride flush  10-40 mL Intracatheter Q12H  . [MAR Hold] spironolactone  12.5 mg Oral Daily   Continuous Infusions: . sodium chloride 50 mL/hr (11/25/18 1253)    Principal Problem:   Acute on chronic systolic CHF (congestive heart failure) (HCC) Active Problems:   Coronary artery disease involving native coronary artery of native heart with angina pectoris (HCC)   Complete heart block (HCC) s/p Pacemaker in 2014   Atrial fibrillation and flutter (HCC)   CKD (chronic kidney disease), stage III (HCC)   LOS: 8 days   A & P  Acute on chronic systolic congestive heart failure Patient presenting from home with progressive shortness of breath and lower extremity edema.  BNP elevated to 784 with chest x-ray notable for cardiomegaly with vascular congestion and pulmonary edema.  Previous echo from June 2020 with EF 20%, diffuse HK, moderate LAE, mild-mod TR, and at least moderate AS. Fluid balance negative by 659.8. Coreg, Lasix, and entresto held due to hypotension. Advanced heart failure team consulted. The patient now has a PICC and his CVP is being monitored. Echo 123XX123 EF 123456, Diastolic dysfunction, akinesis of the inferior wall, and mid-apical anterior wall and apex. EP Cardiology plans to upgrade the patient's pacemaker tomorrow to improve his cardiac function.  Hypotension:  Resolving. CVP is 8-9 today. Not orthostatic. Lasix has been increased to 80 mg daily.  CAD: Continue home doses of plavix, statin and beta blocker (held according to parameters).  Paroxysmal atrial fibrillation: Heart rate is controlled on beta blocker. Xarelto for CVA prophylaxis for CHADS-VASc of 4. Currently in normal sinus rhythm on telemetry.  Complete heart block: Pacemaker placed in 2014, denies lightheadedness, palpitations, or syncope. His electrophysiologist is planning for upgrade at some point. Monitor on telemetry.  I have seen and examined this patient myself. I have spent 34 minutes in his evaluation and care.  DVT prophylaxis: Xarelto Code Status: Full code Family Communication: None Disposition Plan: Continue inpatient hospitalization, PT/OT consult pending, further dependent on clinical course  Ricardo Anfinson, DO [facility] Direct contact: see [email or web address]  7PM-7AM contact night coverage as above 11/25/2018, 2:17 PM  LOS: 2 days

## 2018-11-25 NOTE — Progress Notes (Signed)
Patient ID: Ricardo Hunt, male   DOB: 1940-08-29, 78 y.o.   MRN: [id number]    Advanced Heart Failure Rounding Note   Subjective:    No complaints today, feels ok.  PICC out for CRT upgrade today.  I/Os negative yesterday, BUN and creatinine higher today. SBP generally 100s-110s.   Objective:   Weight Range:  Vital Signs:   Temp:  [97.6 F (36.4 C)-97.9 F (36.6 C)] 97.6 F (36.4 C) (08/25 0649) Pulse Rate:  [53-107] 84 (08/25 0849) BP: (94-116)/(63-73) 116/73 (08/25 0649) SpO2:  [94 %-96 %] 94 % (08/25 0649) Weight:  [78.7 kg] 78.7 kg (08/25 0649) Last BM Date: 11/23/18  Weight change: Filed Weights   11/23/18 0500 11/24/18 0513 11/25/18 0649  Weight: 79.5 kg 79.2 kg 78.7 kg    Intake/Output:   Intake/Output Summary (Last 24 hours) at 11/25/2018 0855 Last data filed at 11/25/2018 0700 Gross per 24 hour  Intake 920 ml  Output 1950 ml  Net -1030 ml     Physical Exam:  General: NAD Neck: No JVD, no thyromegaly or thyroid nodule.  Lungs: Clear to auscultation bilaterally with normal respiratory effort. CV: Lateral PMI.  Heart regular S1/S2, no S3/S4, 1/6 SEM RUSB (clear S2).  No peripheral edema.  No carotid bruit.  Normal pedal pulses.  Abdomen: Soft, nontender, no hepatosplenomegaly, no distention.  Skin: Intact without lesions or rashes.  Neurologic: Alert and oriented x 3.  Psych: Normal affect. Extremities: No clubbing or cyanosis.  HEENT: Normal.   Telemetry: A-V sequential pacing 80s (personally reviewed)   Labs: Basic Metabolic Panel: Recent Labs  Lab 11/19/18 0513  11/21/18 0431 11/22/18 0228 11/23/18 0458 11/24/18 0454 11/25/18 0335  NA 138   < > 136 137 135 136 136  K 4.3   < > 4.6 4.2 4.5 4.1 4.1  CL 101   < > 103 97* 99 100 97*  CO2 28   < > 25 29 24 26 28   GLUCOSE 134*   < > 119* 137* 215* 186* 148*  BUN 42*   < > 29* 29* 37* 46* 63*  CREATININE 1.95*   < > 1.17 1.49* 1.25* 1.41* 1.74*  CALCIUM 9.0   < > 8.7* 9.4 9.4 9.4 9.3  MG 2.2   --   --   --   --   --   --    < > = values in this interval not displayed.    Liver Function Tests: No results for input(s): AST, ALT, ALKPHOS, BILITOT, PROT, ALBUMIN in the last 168 hours. No results for input(s): LIPASE, AMYLASE in the last 168 hours. No results for input(s): AMMONIA in the last 168 hours.  CBC: Recent Labs  Lab 11/25/18 0335  WBC 10.6*  HGB 15.7  HCT 46.8  MCV 96.1  PLT 228    Cardiac Enzymes: No results for input(s): CKTOTAL, CKMB, CKMBINDEX, TROPONINI in the last 168 hours.  BNP: BNP (last 3 results) Recent Labs    05/20/18 1218 09/18/18 0833 11/16/18 2135  BNP 390.6* 578* 783.8*    ProBNP (last 3 results) No results for input(s): PROBNP in the last 8760 hours.    Other results:  Imaging: No results found.   Medications:     Scheduled Medications: . carvedilol  3.125 mg Oral BID WC  . clopidogrel  75 mg Oral Daily  . digoxin  0.125 mg Oral Daily  . gentamicin irrigation  80 mg Irrigation To Cath  . rivaroxaban  15 mg  Oral Q supper  . rosuvastatin  20 mg Oral q1800  . sodium chloride flush  10-40 mL Intracatheter Q12H  . spironolactone  12.5 mg Oral Daily    Infusions: . sodium chloride    .  ceFAZolin (ANCEF) IV      PRN Medications: acetaminophen, ondansetron (ZOFRAN) IV, sodium chloride flush   Assessment/Plan:   1. Acute on chronic systolic CHF: Ischemic cardiomyopathy.  Echo this admission with EF 20%, mild-moderate AS.  He was admitted with volume overload and diuresed, looks euvolemic now.  Co-ox good at 66% on 8/24, PICC now out. Creatinine 1.49 => 1.25 => 1.41 => 1.74 with rise in BUN.   - Hold Lasix today, resume tomorrow if creatinine lower.  Would restart at lower dose, probably 40 mg po bid.  - Hold Entresto today with rise in creatinine, hopefully restart tomorrow if creatinine stabilizes.  - Continue digoxin.  - Continue spironolactone 12.5 mg daily.  - Continue Coreg 3.125 mg bid.  - He is chronically RV  pacing, think he would benefit from upgrade to CRT-P.  Have discussed with EP, plan upgrade today.  2. CHB: Has St Jude PPM, chronically RV pacing.  - Upgrade to CRT-P as above.  3. Aortic stenosis: Suspect no more than moderate based on echo this admission (read as mild-moderate).  4. CAD: s/p CABG 1996.  In 2/20, found to have occlusion of sequential SVG-D and OM.  Therefore, had PCI with DES to native left main and proximal LCx.   - Continue Plavix x 1 year ideally, at least 6 months.  - Continue statin.  - No ASA given Xarelto use.  5. Atrial fibrillation: Paroxysmal.  He is in NSR.   - Continue Xarelto 15 mg daily.  6. AKI: Creatinine peaked at 1.9 but back up to 1.7 today.   - Holding Lasix and Entresto today.  7. Gout flare: Left knee. Prednisone burst completed, no complaint of knee pain today.   Loralie Champagne 11/25/2018 8:55 AM

## 2018-11-26 ENCOUNTER — Encounter (HOSPITAL_COMMUNITY): Payer: Self-pay | Admitting: Internal Medicine

## 2018-11-26 ENCOUNTER — Inpatient Hospital Stay (HOSPITAL_COMMUNITY): Payer: Medicare Other

## 2018-11-26 DIAGNOSIS — N179 Acute kidney failure, unspecified: Secondary | ICD-10-CM

## 2018-11-26 LAB — BASIC METABOLIC PANEL
Anion gap: 13 (ref 5–15)
BUN: 63 mg/dL — ABNORMAL HIGH (ref 8–23)
CO2: 22 mmol/L (ref 22–32)
Calcium: 8.9 mg/dL (ref 8.9–10.3)
Chloride: 98 mmol/L (ref 98–111)
Creatinine, Ser: 1.54 mg/dL — ABNORMAL HIGH (ref 0.61–1.24)
GFR calc Af Amer: 49 mL/min — ABNORMAL LOW (ref >60)
GFR calc non Af Amer: 42 mL/min — ABNORMAL LOW (ref >60)
Glucose, Bld: 175 mg/dL — ABNORMAL HIGH (ref 70–99)
Potassium: 3.9 mmol/L (ref 3.5–5.1)
Sodium: 133 mmol/L — ABNORMAL LOW (ref 135–145)

## 2018-11-26 LAB — CBC
HCT: 46.3 % (ref 39.0–52.0)
Hemoglobin: 15.1 g/dL (ref 13.0–17.0)
MCH: 31.8 pg (ref 26.0–34.0)
MCHC: 32.6 g/dL (ref 30.0–36.0)
MCV: 97.5 fL (ref 80.0–100.0)
Platelets: 243 10*3/uL (ref 150–400)
RBC: 4.75 MIL/uL (ref 4.22–5.81)
RDW: 14.6 % (ref 11.5–15.5)
WBC: 8.9 10*3/uL (ref 4.0–10.5)
nRBC: 0 % (ref 0.0–0.2)

## 2018-11-26 MED ORDER — LOSARTAN POTASSIUM 25 MG PO TABS
12.5000 mg | ORAL_TABLET | Freq: Every day | ORAL | Status: DC
  Administered 2018-11-26 – 2018-11-27 (×2): 12.5 mg via ORAL
  Filled 2018-11-26 (×2): qty 1

## 2018-11-26 MED ORDER — SPIRONOLACTONE 12.5 MG HALF TABLET
12.5000 mg | ORAL_TABLET | Freq: Every day | ORAL | Status: DC
  Administered 2018-11-26 – 2018-11-27 (×2): 12.5 mg via ORAL
  Filled 2018-11-26 (×2): qty 1

## 2018-11-26 NOTE — Progress Notes (Signed)
PROGRESS NOTE  Ricardo Hunt [id number] DOB: Feb 02, 1941 DOA: 11/16/2018 PCP: Dione Housekeeper, MD  Brief History   Ricardo Hunt a 78 y.o.malewith medical history significant forcoronary artery disease status postremoteCABGand then 2 stents in February 2020, atrial fibrillation on Xarelto, complete heart block with pacer, and ischemic cardiomyopathy with EF 20% in June, now presenting to the emergency department for insidiously worsening dyspnea. Patient reports that he has had some shortness of breath for the past 3 months or so, but has worsened significantly over the past couple days.He reports some bilateral ankle swelling, but does not feel that this has changed much recently. He denied chest pain, fevers, chills, or cough.Patient believes his weight has been fairly stable. He reports eating a lot of salty foods, but states that he always has and there has not been any increase in salt or fluid intake. States that there has not been any medication changes since February and he has continued to be adherent tohis regimen.  ED Course:Upon arrival to the ED, patient is found to be afebrile, saturating mid 90s on room air, slightly tachypneic, and with stable blood pressure. EKG features a sinus rhythm with PVCs, fusion complexes, and LBBB. Chest x-ray is notable for cardiomegaly and vascular congestion with concern for early pulmonary edema. Chemistry panel is notable for a creatinine 1.37, similar to priors. CBC is unremarkable. Troponin is mildly elevated and BNP is elevated to 784 which is higher than priors. Patient was given 40 mg IV Lasix in the emergency department a couple hours ago, but has not urinated much and remains dyspneic at rest, only able to ambulate a few steps without beginning to develop distress, and he will be observed for ongoing evaluation and management.  The patient was admitted to a telemetry bed. He was diuresed, but has been de-escalated to oral  lasix 40 mg bid due to a bump in creatinine and hypotension. Beta blocker and lasix doses have been held due to hypotension as has entresto. Heart failure team has been consulted. PICC has been placed and CVP is being monitored.  Echocardiogram was performed on 11/23/2018. It demonstrated an EF of 123456 with diastolic dysfunction, akinesis of the inferior wall, mid-apical anterior wall, and apex.   EP Cardiology upgraded the patient's pacemaker on 11/25/2018 to improve his cardiac function. The patient tolerated the procedure well.  Consultants  . Heart Failure Team  Procedures  . None  Antibiotics   Anti-infectives (From admission, onward)   Start     Dose/Rate Route Frequency Ordered Stop   11/25/18 1830  ceFAZolin (ANCEF) IVPB 1 g/50 mL premix     1 g 100 mL/hr over 30 Minutes Intravenous Every 6 hours 11/25/18 1543 11/26/18 0624   11/25/18 1400  gentamicin (GARAMYCIN) 80 mg in sodium chloride 0.9 % 500 mL irrigation     80 mg Irrigation To Cath Lab 11/24/18 1254 11/25/18 1458   11/25/18 1400  ceFAZolin (ANCEF) IVPB 2g/100 mL premix     2 g 200 mL/hr over 30 Minutes Intravenous To Cath Lab 11/24/18 1254 11/25/18 1410     Subjective  The patient is resting comfortably. No new complaints.  Objective   Vitals:  Vitals:   11/26/18 0534 11/26/18 1318  BP: 124/80 121/71  Pulse: 72 70  Resp: 17 14  Temp: (!) 97.4 F (36.3 C) 98.3 F (36.8 C)  SpO2: 98% 99%    Exam:  Constitutional:  . The patient is awake, alert, and oriented x 3. No acute  distress. Respiratory:  . No increased work of breathing. . No wheezes, rales, or rhonchi . No tactile fremitus. Cardiovascular:  . Regular rate and rhythm. . No murmurs, ectopy, or gallups.  . No lateral PMI. No thrills. Abdomen:  . Abdomen is soft, non-tender, non-distended. . No hernias, masses, or hernias are appreciated. . Normoactive bowel sounds. Musculoskeletal:  . No cyanosis, clubbing, or edema. Skin:  . No rashes,  lesions, ulcers . palpation of skin: no induration or nodules Neurologic:  . CN 2-12 intact . Sensation all 4 extremities intact Psychiatric:  . Mental status o Mood, affect appropriate o Orientation to person, place, time  . judgment and insight appear intact   I have personally reviewed the following:   Today's Data  . Vitals, BMP  Cardiology Data  . Echo 09/2018 EF 20% with global hypokinesis. Marland Kitchen Echo 123XX123 EF 123456, Diastolic dysfunction, akinesis of the inferior wall, and mid-apical anterior wall and apex.  Scheduled Meds: . carvedilol  3.125 mg Oral BID WC  . clopidogrel  75 mg Oral Daily  . digoxin  0.125 mg Oral Daily  . losartan  12.5 mg Oral Daily  . rosuvastatin  20 mg Oral q1800  . sodium chloride flush  3 mL Intravenous Q12H  . spironolactone  12.5 mg Oral Daily   Continuous Infusions: . sodium chloride      Principal Problem:   Acute on chronic systolic CHF (congestive heart failure) (HCC) Active Problems:   Coronary artery disease involving native coronary artery of native heart with angina pectoris (HCC)   Complete heart block (HCC) s/p Pacemaker in 2014   Atrial fibrillation and flutter (HCC)   CKD (chronic kidney disease), stage III (HCC)   LOS: 9 days   A & P  Acute on chronic systolic congestive heart failure Patient presenting from home with progressive shortness of breath and lower extremity edema.  BNP elevated to 784 with chest x-ray notable for cardiomegaly with vascular congestion and pulmonary edema.  Previous echo from June 2020 with EF 20%, diffuse HK, moderate LAE, mild-mod TR, and at least moderate AS. Fluid balance negative by 659.8. Coreg, Lasix, and entresto held due to hypotension. Advanced heart failure team consulted. The patient now has a PICC and his CVP is being monitored. Echo 123XX123 EF 123456, Diastolic dysfunction, akinesis of the inferior wall, and mid-apical anterior wall and apex. EP Cardiology upgraded the patient's pacemaker  tomorrow to improve his cardiac function.  Hypotension: Resolving. Not orthostatic. Lasix has been discontinued.  CAD: Continue home doses of plavix, statin and beta blocker (held according to parameters).  Paroxysmal atrial fibrillation: Heart rate is controlled on beta blocker. Xarelto for CVA prophylaxis for CHADS-VASc of 4. Currently in normal sinus rhythm on telemetry.  Complete heart block: Pacemaker placed in 2014, denies lightheadedness, palpitations, or syncope. Monitor on telemetry. Pacemaker has been upgraded.  I have seen and examined this patient myself. I have spent 36 minutes in his evaluation and care.  DVT prophylaxis: Xarelto Code Status: Full code Family Communication: None Disposition Plan: Continue inpatient hospitalization, PT/OT consult pending, further dependent on clinical course  Leidy Massar, DO [facility] Direct contact: see [email or web address]  7PM-7AM contact night coverage as above 11/26/2018, 7:45 PM  LOS: 2 days

## 2018-11-26 NOTE — Progress Notes (Signed)
Doing well sp CRT-P upgrade  Device interrogation is reviewed and normal CXR reveals stable leads no ptx.  No further inpatient workup planned from [place] to discharge from out standpoint.  EP PA to educate patient on routine wound care this am.  Electrophysiology team to see as needed while here. Please call with questions.  Thompson Grayer MD, [facility] 11/26/2018 8:38 AM

## 2018-11-26 NOTE — Progress Notes (Addendum)
Electrophysiology Rounding Note  Patient Name: Ricardo Hunt Date of Encounter: 11/26/2018  Primary Cardiologist: Dr. Aundra Dubin Electrophysiologist: Dr. [name]   Subjective   S/p CRT-D upgrade 11/25/2018  The patient is doing well today.  At this time, the patient denies chest pain, shortness of breath, or any new concerns.  CXR and wound site look stable.   Inpatient Medications    Scheduled Meds: . carvedilol  3.125 mg Oral BID WC  . clopidogrel  75 mg Oral Daily  . digoxin  0.125 mg Oral Daily  . rosuvastatin  20 mg Oral q1800  . sodium chloride flush  3 mL Intravenous Q12H   Continuous Infusions: . sodium chloride     PRN Meds: sodium chloride, acetaminophen, HYDROcodone-acetaminophen, ondansetron (ZOFRAN) IV, sodium chloride flush   Vital Signs    Vitals:   11/25/18 1915 11/25/18 2148 11/26/18 0534 11/26/18 0540  BP: 97/65 115/71 124/80   Pulse:  72 72   Resp:   17   Temp:  98 F (36.7 C) (!) 97.4 F (36.3 C)   TempSrc:  Oral Oral   SpO2:  97% 98%   Weight:    79.3 kg  Height:        Intake/Output Summary (Last 24 hours) at 11/26/2018 0822 Last data filed at 11/26/2018 0554 Gross per 24 hour  Intake 343 ml  Output 750 ml  Net -407 ml   Filed Weights   11/24/18 0513 11/25/18 0649 11/26/18 0540  Weight: 79.2 kg 78.7 kg 79.3 kg    Physical Exam    GEN- The patient is well appearing, alert and oriented x 3 today.   Head- normocephalic, atraumatic Eyes-  Sclera clear, conjunctiva pink Ears- hearing intact Oropharynx- clear Neck- supple Lungs- Clear to ausculation bilaterally, normal work of breathing Heart- Regular rate and rhythm, no murmurs, rubs or gallops GI- soft, NT, ND, + BS Extremities- no clubbing, cyanosis, or edema Skin- no rash or lesion Psych- euthymic mood, full affect Neuro- strength and sensation are intact  Labs    CBC Recent Labs    11/25/18 0335 11/26/18 0311  WBC 10.6* 8.9  HGB 15.7 15.1  HCT 46.8 46.3  MCV 96.1  97.5  PLT 228 0000000   Basic Metabolic Panel Recent Labs    11/25/18 0335 11/26/18 0311  NA 136 133*  K 4.1 3.9  CL 97* 98  CO2 28 22  GLUCOSE 148* 175*  BUN 63* 63*  CREATININE 1.74* 1.54*  CALCIUM 9.3 8.9   Liver Function Tests No results for input(s): AST, ALT, ALKPHOS, BILITOT, PROT, ALBUMIN in the last 72 hours. No results for input(s): LIPASE, AMYLASE in the last 72 hours. Cardiac Enzymes No results for input(s): CKTOTAL, CKMB, CKMBINDEX, TROPONINI in the last 72 hours. BNP Invalid input(s): POCBNP D-Dimer No results for input(s): DDIMER in the last 72 hours. Hemoglobin A1C No results for input(s): HGBA1C in the last 72 hours. Fasting Lipid Panel No results for input(s): CHOL, HDL, LDLCALC, TRIG, CHOLHDL, LDLDIRECT in the last 72 hours. Thyroid Function Tests No results for input(s): TSH, T4TOTAL, T3FREE, THYROIDAB in the last 72 hours.  Invalid input(s): FREET3  Telemetry    AS-V paced 60-70s (95% BiV-P by device interrogation. (personally reviewed)  Radiology    No results found.  Assessment & Plan    1. [place] II/CHB Was RV pacing 92% by previous device interrogation. Now s/p CRT-P upgrade 11/25/2018 ECG shows V paced at 68 bpm with QRS 178 ms (compared to 194  ms pre-op/11/24/18)  2. Chronic systolic CHF EF remains low despite GDMT. Now s/p CRT- P upgrade.   3. CAD Stable  4. Persistent Afib Continue Xarelto for CHA2DS2VASC of 5  Follow up made. EP will see as needed if not discharged today. Please call with any questions!  For questions or updates, please contact [place] Please consult [email or web address] for contact info under Cardiology/STEMI.  Signed, Shirley Friar, PA-C  11/26/2018, 8:22 AM   I have reviewed the above assessment and plan.  Changes to above are made where necessary.  CXR reveals stable leads, no ptx.  Device interrogation is reviewed and norma  Ok to discharge from EP standpoint with routine wound care and  follow-up  Co Sign: Thompson Grayer, MD 11/26/2018 10:36 AM

## 2018-11-26 NOTE — Progress Notes (Signed)
Patient ID: Ricardo Hunt, male   DOB: 07/25/1940, 78 y.o.   MRN: [id number]    Advanced Heart Failure Rounding Note   Subjective:    No complaints today, feels ok.  St Jude CRT-P upgrade yesterday.  BUN remains high but creatinine lower at 1.5.   Objective:   Weight Range:  Vital Signs:   Temp:  [97 F (36.1 C)-98 F (36.7 C)] 97.4 F (36.3 C) (08/26 0534) Pulse Rate:  [22-181] 72 (08/26 0534) Resp:  [9-52] 17 (08/26 0534) BP: (80-150)/(57-85) 124/80 (08/26 0534) SpO2:  [96 %-100 %] 98 % (08/26 0534) Weight:  [79.3 kg] 79.3 kg (08/26 0540) Last BM Date: 11/25/18  Weight change: Filed Weights   11/24/18 0513 11/25/18 0649 11/26/18 0540  Weight: 79.2 kg 78.7 kg 79.3 kg    Intake/Output:   Intake/Output Summary (Last 24 hours) at 11/26/2018 0927 Last data filed at 11/26/2018 0554 Gross per 24 hour  Intake 343 ml  Output 750 ml  Net -407 ml     Physical Exam:  General: NAD Neck: No JVD, no thyromegaly or thyroid nodule.  Lungs: Clear to auscultation bilaterally with normal respiratory effort. CV: Lateral PMI.  Heart regular S1/S2, no S3/S4, 1/6 SEM RUSB.  No peripheral edema.   Abdomen: Soft, nontender, no hepatosplenomegaly, no distention.  Skin: Intact without lesions or rashes.  Neurologic: Alert and oriented x 3.  Psych: Normal affect. Extremities: No clubbing or cyanosis.  HEENT: Normal.   Telemetry: NSR, BiV-paced 80s (personally reviewed)   Labs: Basic Metabolic Panel: Recent Labs  Lab 11/22/18 0228 11/23/18 0458 11/24/18 0454 11/25/18 0335 11/26/18 0311  NA 137 135 136 136 133*  K 4.2 4.5 4.1 4.1 3.9  CL 97* 99 100 97* 98  CO2 29 24 26 28 22   GLUCOSE 137* 215* 186* 148* 175*  BUN 29* 37* 46* 63* 63*  CREATININE 1.49* 1.25* 1.41* 1.74* 1.54*  CALCIUM 9.4 9.4 9.4 9.3 8.9    Liver Function Tests: No results for input(s): AST, ALT, ALKPHOS, BILITOT, PROT, ALBUMIN in the last 168 hours. No results for input(s): LIPASE, AMYLASE in the last 168  hours. No results for input(s): AMMONIA in the last 168 hours.  CBC: Recent Labs  Lab 11/25/18 0335 11/26/18 0311  WBC 10.6* 8.9  HGB 15.7 15.1  HCT 46.8 46.3  MCV 96.1 97.5  PLT 228 243    Cardiac Enzymes: No results for input(s): CKTOTAL, CKMB, CKMBINDEX, TROPONINI in the last 168 hours.  BNP: BNP (last 3 results) Recent Labs    05/20/18 1218 09/18/18 0833 11/16/18 2135  BNP 390.6* 578* 783.8*    ProBNP (last 3 results) No results for input(s): PROBNP in the last 8760 hours.    Other results:  Imaging: No results found.   Medications:     Scheduled Medications: . carvedilol  3.125 mg Oral BID WC  . clopidogrel  75 mg Oral Daily  . digoxin  0.125 mg Oral Daily  . losartan  12.5 mg Oral Daily  . rosuvastatin  20 mg Oral q1800  . sodium chloride flush  3 mL Intravenous Q12H    Infusions: . sodium chloride      PRN Medications: sodium chloride, acetaminophen, HYDROcodone-acetaminophen, ondansetron (ZOFRAN) IV, sodium chloride flush   Assessment/Plan:   1. Acute on chronic systolic CHF: Ischemic cardiomyopathy.  Echo this admission with EF 20%, mild-moderate AS.  He was admitted with volume overload and diuresed, looks euvolemic now.  Co-ox good at 66% on 8/24, PICC  now out. Creatinine 1.49 => 1.25 => 1.41 => 1.74 => 1.5 with rise in BUN.  Now s/p St Jude CRT-P upgrade.  - I think we can hold Lasix again today.  Would restart at lower dose tomorrow, probably 40 mg po bid.  - With soft BP and creatinine up, will not use Entresto for now.  Start losartan 12.5 daily.  - Continue digoxin.  - Continue spironolactone 12.5 mg daily.  - Continue Coreg 3.125 mg bid.  2. CHB: Upgraded to CRT-P [facility] Jude).  3. Aortic stenosis: Suspect no more than moderate based on echo this admission (read as mild-moderate).  4. CAD: s/p CABG 1996.  In 2/20, found to have occlusion of sequential SVG-D and OM.  Therefore, had PCI with DES to native left main and proximal LCx.    - Continue Plavix x 1 year ideally, at least 6 months.  - Continue statin.  - No ASA given Xarelto use.  5. Atrial fibrillation: Paroxysmal.  He is in NSR.   - Continue Xarelto 15 mg daily.  6. AKI: Creatinine peaked at 1.9, 1.5 today.   - Hold Lasix again today.  7. Gout flare: Left knee. Prednisone burst completed, no complaint of knee pain today.   Would watch back on losartan and spironolactone today, if creatinine stable to lower can restart Lasix po tomorrow and send home.   Loralie Champagne 11/26/2018 9:27 AM

## 2018-11-27 LAB — BASIC METABOLIC PANEL
Anion gap: 10 (ref 5–15)
BUN: 45 mg/dL — ABNORMAL HIGH (ref 8–23)
CO2: 26 mmol/L (ref 22–32)
Calcium: 9.2 mg/dL (ref 8.9–10.3)
Chloride: 100 mmol/L (ref 98–111)
Creatinine, Ser: 1.23 mg/dL (ref 0.61–1.24)
GFR calc Af Amer: >60 mL/min (ref >60)
GFR calc non Af Amer: 55 mL/min — ABNORMAL LOW (ref >60)
Glucose, Bld: 122 mg/dL — ABNORMAL HIGH (ref 70–99)
Potassium: 4.1 mmol/L (ref 3.5–5.1)
Sodium: 136 mmol/L (ref 135–145)

## 2018-11-27 LAB — CBC
HCT: 46.5 % (ref 39.0–52.0)
Hemoglobin: 15.2 g/dL (ref 13.0–17.0)
MCH: 32 pg (ref 26.0–34.0)
MCHC: 32.7 g/dL (ref 30.0–36.0)
MCV: 97.9 fL (ref 80.0–100.0)
Platelets: 193 10*3/uL (ref 150–400)
RBC: 4.75 MIL/uL (ref 4.22–5.81)
RDW: 14.6 % (ref 11.5–15.5)
WBC: 7.4 10*3/uL (ref 4.0–10.5)
nRBC: 0 % (ref 0.0–0.2)

## 2018-11-27 MED ORDER — CARVEDILOL 3.125 MG PO TABS: 3.1250 mg | ORAL_TABLET | Freq: Two times a day (BID) | ORAL | 0 refills | Status: DC

## 2018-11-27 MED ORDER — HYDROCODONE-ACETAMINOPHEN 5-325 MG PO TABS: 1.0000 | ORAL_TABLET | ORAL | 0 refills | Status: DC | PRN

## 2018-11-27 MED ORDER — DIGOXIN 125 MCG PO TABS: 0.1250 mg | ORAL_TABLET | Freq: Every day | ORAL | 0 refills | Status: DC

## 2018-11-27 MED ORDER — LOSARTAN POTASSIUM 25 MG PO TABS: 12.5000 mg | ORAL_TABLET | Freq: Every day | ORAL | 0 refills | Status: DC

## 2018-11-27 MED ORDER — RIVAROXABAN 15 MG PO TABS: 15.0000 mg | ORAL_TABLET | Freq: Every day | ORAL | 0 refills | Status: DC

## 2018-11-27 MED ORDER — FUROSEMIDE 40 MG PO TABS
40.0000 mg | ORAL_TABLET | Freq: Two times a day (BID) | ORAL | Status: DC
  Administered 2018-11-27: 10:00:00 40 mg via ORAL
  Filled 2018-11-27: qty 1

## 2018-11-27 MED ORDER — RIVAROXABAN 15 MG PO TABS
15.0000 mg | ORAL_TABLET | Freq: Every day | ORAL | Status: DC
  Administered 2018-11-27: 10:00:00 15 mg via ORAL
  Filled 2018-11-27: qty 1

## 2018-11-27 MED FILL — LOSARTAN POTASSIUM 25 MG TA: 25 | 60 days supply | Qty: 30 | Fill #0

## 2018-11-27 MED FILL — XARELTO STARTER PACK: 15 & 20 | 30 days supply | Qty: 51 | Fill #0

## 2018-11-27 MED FILL — CARVEDILOL 3.125 MG TABLET: 3.125 | 30 days supply | Qty: 60 | Fill #0

## 2018-11-27 MED FILL — DIGOXIN 0.125 MG TABLET: 125 | 30 days supply | Qty: 30 | Fill #0

## 2018-11-27 NOTE — Discharge Summary (Signed)
Physician Discharge Summary  Ricardo Hunt [id number] DOB: 08/16/1940 DOA: 11/16/2018  PCP: Dione Housekeeper, MD  Admit date: 11/16/2018 Discharge date: 11/27/2018  Recommendations for Outpatient Follow-up:  1. Follow up with PCP in 7-10 days 2. Follow up with cardiology as directed.  3. BMP drawn in one week and reported to PCP.  Follow-up Information    Dione Housekeeper, MD.   Specialty: [facility] Contact information: [place] [address] [phone]        Baldwin Jamaica, PA-C Follow up on 12/05/2018.   Specialty: Cardiology Why: at [facility] information: [address] [place] [address] [phone]        Thompson Grayer, MD Follow up on 02/25/2019.   Specialty: Cardiology Why: at [facility] information: [place] [address] [phone]        Larey Dresser, MD Follow up.   Specialty: Cardiology Why: Dr. Claris Gladden office will call you with a hospital follow-up appointment in 10-14 days Contact information: [place] [address] [phone]            Discharge Diagnoses: Principal diagnosis is #1 1. Acute on chronic systolic congestive heart failure 2. Hypotension 3. CAD 4. Paroxysmal atrial fibrillation 5. Complete heart block  Discharge Condition: Good  Disposition: Home  Diet recommendation: Heart Healthy  Filed Weights   11/25/18 0649 11/26/18 0540 11/27/18 0729  Weight: 78.7 kg 79.3 kg 78.8 kg    History of present illness:   Ricardo Hunt is a 78 y.o. male with medical history significant for coronary artery disease status post remote CABG and then 2 stents in February 2020, atrial fibrillation on Xarelto, complete heart block with pacer, and ischemic cardiomyopathy with EF 20% in June, now presenting to the emergency department for insidiously worsening dyspnea.  Patient reports that he has had some shortness of breath for the past 3 months  or so, but has worsened significantly over the past couple days.  He reports some bilateral ankle swelling, but does not feel that this has changed much recently.  He denied chest pain, fevers, chills, or cough.  Patient believes his weight has been fairly stable.  He reports eating a lot of salty foods, but states that he always has and there has not been any increase in salt or fluid intake.  States that there has not been any medication changes since February and he has continued to be adherent to his regimen.  ED Course: Upon arrival to the ED, patient is found to be afebrile, saturating mid 90s on room air, slightly tachypneic, and with stable blood pressure.  EKG features a sinus rhythm with PVCs, fusion complexes, and LBBB.  Chest x-ray is notable for cardiomegaly and vascular congestion with concern for early pulmonary edema.  Chemistry panel is notable for a creatinine 1.37, similar to priors.  CBC is unremarkable.  Troponin is mildly elevated and BNP is elevated to 784 which is higher than priors.  Patient was given 40 mg IV Lasix in the emergency department a couple hours ago, but has not urinated much and remains dyspneic at rest, only able to ambulate a few steps without beginning to develop distress, and he will be observed for ongoing evaluation and management.  Hospital Course:  The patient was admitted to a telemetry bed. He was diuresed, but has been de-escalated to oral lasix 40 mg bid due to a bump in creatinine and hypotension.  Beta blocker and lasix doses have been held due to hypotension as has entresto. Heart failure team has been consulted. PICC has been placed and CVP is being monitored.  Echocardiogram was performed on 11/23/2018. It demonstrated an EF of 123456 with diastolic dysfunction, akinesis of the inferior wall, mid-apical anterior wall, and apex.   EP Cardiology upgraded the patient's pacemaker on 11/25/2018 to improve his cardiac function. The patient tolerated the  procedure well.  He has been cleared for discharge by cardiology.  Today's assessment: S: The patient is resting comfortably. No new complaints. O: Vitals:  Vitals:   11/26/18 2038 11/27/18 0500  BP: 135/80 116/72  Pulse: 72 70  Resp: 16 19  Temp: 98.6 F (37 C) 98.3 F (36.8 C)  SpO2: 99% 100%   Constitutional:   The patient is awake, alert, and oriented x 3. No acute distress. Respiratory:   No increased work of breathing.  No wheezes, rales, or rhonchi  No tactile fremitus. Cardiovascular:   Regular rate and rhythm.  No murmurs, ectopy, or gallups.   No lateral PMI. No thrills. Abdomen:   Abdomen is soft, non-tender, non-distended.  No hernias, masses, or hernias are appreciated.  Normoactive bowel sounds. Musculoskeletal:   No cyanosis, clubbing, or edema. Skin:   No rashes, lesions, ulcers  palpation of skin: no induration or nodules Neurologic:   CN 2-12 intact  Sensation all 4 extremities intact Psychiatric:   Mental status ? Mood, affect appropriate ? Orientation to person, place, time   judgment and insight appear intact    Discharge Instructions  Discharge Instructions    (HEART FAILURE PATIENTS) Call MD:  Anytime you have any of the following symptoms: 1) 3 pound weight gain in 24 hours or 5 pounds in 1 week 2) shortness of breath, with or without a dry hacking cough 3) swelling in the hands, feet or stomach 4) if you have to sleep on extra pillows at night in order to breathe.   Complete by: As directed    Call MD for:  difficulty breathing, headache or visual disturbances   Complete by: As directed    Call MD for:  persistant dizziness or light-headedness   Complete by: As directed    Call MD for:  severe uncontrolled pain   Complete by: As directed    Diet - low sodium heart healthy   Complete by: As directed    Discharge instructions   Complete by: As directed    Follow up with PCP in 7-10 days.  Have chemistry drawn in 1  week and reported to PCP. Follow up with CHF clinic as directed.   Heart Failure patients record your daily weight using the same scale at the same time of day   Complete by: As directed    Increase activity slowly   Complete by: As directed    STOP any activity that causes chest pain, shortness of breath, dizziness, sweating, or exessive weakness   Complete by: As directed      Allergies as of 11/27/2018   No Known Allergies     Medication List    STOP taking these medications   aspirin EC 81 MG tablet   Fish Oil 1000 MG Caps   meloxicam 7.5 MG tablet Commonly known as: MOBIC   potassium chloride SA 20 MEQ tablet Commonly known as: K-DUR   sacubitril-valsartan 24-26 MG Commonly known as: ENTRESTO     TAKE these medications   acetaminophen 500 MG tablet Commonly known as:  TYLENOL Take 1,000 mg by mouth 2 (two) times daily as needed (for leg pain/pain).   carvedilol 3.125 MG tablet Commonly known as: COREG Take 1 tablet (3.125 mg total) by mouth 2 (two) times daily with a meal. What changed:   medication strength  how much to take   clopidogrel 75 MG tablet Commonly known as: PLAVIX Take 1 tablet (75 mg total) by mouth daily.   digoxin 0.125 MG tablet Commonly known as: LANOXIN Take 1 tablet (0.125 mg total) by mouth daily. Start taking on: November 28, 2018   furosemide 40 MG tablet Commonly known as: LASIX Take 1 tablet (40 mg total) by mouth 2 (two) times daily.   HYDROcodone-acetaminophen 5-325 MG tablet Commonly known as: NORCO/VICODIN Take 1-2 tablets by mouth every 4 (four) hours as needed for moderate pain.   losartan 25 MG tablet Commonly known as: COZAAR Take 0.5 tablets (12.5 mg total) by mouth daily. Start taking on: November 28, 2018   Rivaroxaban 15 MG Tabs tablet Commonly known as: XARELTO Take 1 tablet (15 mg total) by mouth daily. Start taking on: November 28, 2018 What changed:   medication strength  how much to take  when to take  this   rosuvastatin 20 MG tablet Commonly known as: CRESTOR Take 1 tablet (20 mg total) by mouth daily at 6 PM.   SLEEP AID PO Take 1 tablet by mouth at bedtime as needed (for sleep).   spironolactone 25 MG tablet Commonly known as: ALDACTONE Take 0.5 tablets (12.5 mg total) by mouth daily.      No Known Allergies  The results of significant diagnostics from this hospitalization (including imaging, microbiology, ancillary and laboratory) are listed below for reference.    Significant Diagnostic Studies: Dg Chest 2 View  Result Date: 11/26/2018 CLINICAL DATA:  Patient status post pacemaker placement yesterday. EXAM: CHEST - 2 VIEW COMPARISON:  PA and lateral chest 11/16/2018. FINDINGS: Three lead pacing device is in place with leads in the right atrium, right ventricle and exiting the coronary sinus. No pneumothorax. Lungs are clear. There is cardiomegaly. Atherosclerosis noted. No acute or focal bony abnormality. IMPRESSION: Pacemaker leads project in good position. No pneumothorax or acute disease. Cardiomegaly. Electronically Signed   By: Inge Rise M.D.   On: 11/26/2018 09:28   Dg Chest 2 View  Result Date: 11/16/2018 CLINICAL DATA:  Shortness of breath. EXAM: CHEST - 2 VIEW COMPARISON:  October 16, 2016. FINDINGS: The heart size is enlarged. There is volume overload with mild developing pulmonary edema. There is a dual chamber left-sided pacemaker. The patient is status post prior median sternotomy. Aortic calcifications are noted. There are degenerative changes throughout the thoracic spine. There is no pneumothorax. IMPRESSION: Cardiomegaly with vascular congestion and possible early developing pulmonary edema. Electronically Signed   By: Constance Holster M.D.   On: 11/16/2018 21:56   [place] Ekg Site Rite  Result Date: 11/21/2018 If Site Rite image not attached, placement could not be confirmed due to current cardiac rhythm.   Microbiology: Recent Results (from the past 240  hour(s))  Surgical PCR screen     Status: None   Collection Time: 11/24/18 12:55 PM   Specimen: Nasal Mucosa; Nasal Swab  Result Value Ref Range Status   MRSA, PCR NEGATIVE NEGATIVE Final   Staphylococcus aureus NEGATIVE NEGATIVE Final    Comment: (NOTE) The Xpert SA Assay (FDA approved for NASAL specimens in patients 23 years of age and older), is one component of a comprehensive surveillance  program. It is not intended to diagnose infection nor to guide or monitor treatment. Performed at [facility], [place] [address] [place], [place] [address]      Labs: Basic Metabolic Panel: Recent Labs  Lab 11/23/18 0458 11/24/18 0454 11/25/18 0335 11/26/18 0311 11/27/18 0307  NA 135 136 136 133* 136  K 4.5 4.1 4.1 3.9 4.1  CL 99 100 97* 98 100  CO2 24 26 28 22 26   GLUCOSE 215* 186* 148* 175* 122*  BUN 37* 46* 63* 63* 45*  CREATININE 1.25* 1.41* 1.74* 1.54* 1.23  CALCIUM 9.4 9.4 9.3 8.9 9.2   Liver Function Tests: No results for input(s): AST, ALT, ALKPHOS, BILITOT, PROT, ALBUMIN in the last 168 hours. No results for input(s): LIPASE, AMYLASE in the last 168 hours. No results for input(s): AMMONIA in the last 168 hours. CBC: Recent Labs  Lab 11/25/18 0335 11/26/18 0311 11/27/18 0307  WBC 10.6* 8.9 7.4  HGB 15.7 15.1 15.2  HCT 46.8 46.3 46.5  MCV 96.1 97.5 97.9  PLT 228 243 193   Cardiac Enzymes: No results for input(s): CKTOTAL, CKMB, CKMBINDEX, TROPONINI in the last 168 hours. BNP: BNP (last 3 results) Recent Labs    05/20/18 1218 09/18/18 0833 11/16/18 2135  BNP 390.6* 578* 783.8*    ProBNP (last 3 results) No results for input(s): PROBNP in the last 8760 hours.  CBG: No results for input(s): GLUCAP in the last 168 hours.  Principal Problem:   Acute on chronic systolic CHF (congestive heart failure) (HCC) Active Problems:   Coronary artery disease involving native coronary artery of native heart with angina pectoris ([place])   Complete heart block  ([place]) s/p Pacemaker in 2014   Atrial fibrillation and flutter (HCC)   CKD (chronic kidney disease), stage III ([place])   Time coordinating discharge: 38 minutes.  Signed:        Saahas Hidrogo, DO [facility]  11/27/2018, 5:15 PM

## 2018-11-27 NOTE — Progress Notes (Signed)
Patient ID: Ricardo Hunt, male   DOB: [birth year], 78 y.o.   MRN: [id number]    Advanced Heart Failure Rounding Note   Subjective:    No complaints today, feels ok.  St Jude CRT-P upgrade 8/25.  BUN/creatinine improved.   Objective:   Weight Range:  Vital Signs:   Temp:  [98.3 F (36.8 C)-98.6 F (37 C)] 98.3 F (36.8 C) (08/27 0500) Pulse Rate:  [70-72] 70 (08/27 0500) Resp:  [14-19] 19 (08/27 0500) BP: (116-135)/(71-80) 116/72 (08/27 0500) SpO2:  [99 %-100 %] 100 % (08/27 0500) Weight:  [78.8 kg] 78.8 kg (08/27 0729) Last BM Date: 11/25/18  Weight change: Filed Weights   11/25/18 0649 11/26/18 0540 11/27/18 0729  Weight: 78.7 kg 79.3 kg 78.8 kg    Intake/Output:   Intake/Output Summary (Last 24 hours) at 11/27/2018 0839 Last data filed at 11/26/2018 2207 Gross per 24 hour  Intake 483 ml  Output 550 ml  Net -67 ml     Physical Exam:  General: NAD Neck: No JVD, no thyromegaly or thyroid nodule.  Lungs: Clear to auscultation bilaterally with normal respiratory effort. CV: Nondisplaced PMI.  Heart regular S1/S2, no S3/S4, no murmur.  No peripheral edema.   Abdomen: Soft, nontender, no hepatosplenomegaly, no distention.  Skin: Intact without lesions or rashes.  Neurologic: Alert and oriented x 3.  Psych: Normal affect. Extremities: No clubbing or cyanosis.  HEENT: Normal.   Telemetry: NSR, BiV-paced 80s (personally reviewed)   Labs: Basic Metabolic Panel: Recent Labs  Lab 11/23/18 0458 11/24/18 0454 11/25/18 0335 11/26/18 0311 11/27/18 0307  NA 135 136 136 133* 136  K 4.5 4.1 4.1 3.9 4.1  CL 99 100 97* 98 100  CO2 24 26 28 22 26   GLUCOSE 215* 186* 148* 175* 122*  BUN 37* 46* 63* 63* 45*  CREATININE 1.25* 1.41* 1.74* 1.54* 1.23  CALCIUM 9.4 9.4 9.3 8.9 9.2    Liver Function Tests: No results for input(s): AST, ALT, ALKPHOS, BILITOT, PROT, ALBUMIN in the last 168 hours. No results for input(s): LIPASE, AMYLASE in the last 168 hours. No results  for input(s): AMMONIA in the last 168 hours.  CBC: Recent Labs  Lab 11/25/18 0335 11/26/18 0311 11/27/18 0307  WBC 10.6* 8.9 7.4  HGB 15.7 15.1 15.2  HCT 46.8 46.3 46.5  MCV 96.1 97.5 97.9  PLT 228 243 193    Cardiac Enzymes: No results for input(s): CKTOTAL, CKMB, CKMBINDEX, TROPONINI in the last 168 hours.  BNP: BNP (last 3 results) Recent Labs    05/20/18 1218 09/18/18 0833 11/16/18 2135  BNP 390.6* 578* 783.8*    ProBNP (last 3 results) No results for input(s): PROBNP in the last 8760 hours.    Other results:  Imaging: Dg Chest 2 View  Result Date: 11/26/2018 CLINICAL DATA:  Patient status post pacemaker placement yesterday. EXAM: CHEST - 2 VIEW COMPARISON:  PA and lateral chest 11/16/2018. FINDINGS: Three lead pacing device is in place with leads in the right atrium, right ventricle and exiting the coronary sinus. No pneumothorax. Lungs are clear. There is cardiomegaly. Atherosclerosis noted. No acute or focal bony abnormality. IMPRESSION: Pacemaker leads project in good position. No pneumothorax or acute disease. Cardiomegaly. Electronically Signed   By: Inge Rise M.D.   On: 11/26/2018 09:28     Medications:     Scheduled Medications: . carvedilol  3.125 mg Oral BID WC  . clopidogrel  75 mg Oral Daily  . digoxin  0.125 mg Oral Daily  .  furosemide  40 mg Oral BID  . losartan  12.5 mg Oral Daily  . rosuvastatin  20 mg Oral q1800  . sodium chloride flush  3 mL Intravenous Q12H  . spironolactone  12.5 mg Oral Daily    Infusions: . sodium chloride      PRN Medications: sodium chloride, acetaminophen, HYDROcodone-acetaminophen, ondansetron (ZOFRAN) IV, sodium chloride flush   Assessment/Plan:   1. Acute on chronic systolic CHF: Ischemic cardiomyopathy.  Echo this admission with EF 20%, mild-moderate AS.  He was admitted with volume overload and diuresed, looks euvolemic now.  Co-ox good at 66% on 8/24, PICC now out. Creatinine 1.49 => 1.25 =>  1.41 => 1.74 => 1.5 => 1.23.  Now s/p St Jude CRT-P upgrade. Volume status looks ok. - Can restart Lasix 40 mg po bid for home.  - With soft BP and recent AKI, will not use Entresto for now.  Continue losartan 12.5 daily.  - Continue digoxin.  - Continue spironolactone 12.5 mg daily.  - Continue Coreg 3.125 mg bid.  2. CHB: Upgraded to CRT-P [facility] Jude).  3. Aortic stenosis: Suspect no more than moderate based on echo this admission (read as mild-moderate).  4. CAD: s/p CABG 1996.  In 2/20, found to have occlusion of sequential SVG-D and OM.  Therefore, had PCI with DES to native left main and proximal LCx.   - Continue Plavix x 1 year ideally, at least 6 months.  - Continue statin.  - No ASA given Xarelto use.  5. Atrial fibrillation: Paroxysmal.  He is in NSR.   - Continue Xarelto 15 mg daily.  6. AKI: Creatinine peaked at 1.9, 1.23 today.   7. Gout flare: Left knee. Prednisone burst completed, no complaint of knee pain today.   Think he is ready for discharge. Needs followup in 10-14 days in CHF clinic and will need BMET 1 week.  Cardiac meds for home: Lasix 40 mg po bid, spironolactone 12.5 daily, losartan 12.5 daily, Coreg 3.125 mg bid, digoxin 0.125 daily, Plavix 75 daily, Xarelto 15 daily, Crestor 40 daily.   Loralie Champagne 11/27/2018 8:39 AM

## 2018-12-02 ENCOUNTER — Telehealth: Payer: Self-pay | Admitting: Internal Medicine

## 2018-12-02 NOTE — Telephone Encounter (Signed)
Device site swollen, size of a golf ball per daughter. Per daughter, patient says it was swollen when he left the hospital but it has more swelling now.   Images sent to Amber for review and advised to hold xarelto for 2 days (today and tomorrow) Come to the office for a pressure dressing, leave on til Friday. Patient will come to the office on Friday to have site assessed by [employer].  Patient lives in [place] and will come to the office in the morning and 8:00 am to have pressure dressing placed.  Daughter aware and verbalized understanding of plan.

## 2018-12-02 NOTE — Telephone Encounter (Signed)
Vista Deck -daughter called stating that patient had a procedure on 11-25-2018 with Dr. [name]. States that the area is swollen.  No redness or no fever.   [phone].

## 2018-12-03 ENCOUNTER — Encounter: Payer: Self-pay | Admitting: *Deleted

## 2018-12-03 NOTE — Progress Notes (Signed)
Present to office to have pressure dressing placed per [employer]. Left upper chest device site puffy. No redness. Dark dried blood on steri-strips. Pressure applied to site with 4 4x4 gauze for 5 minutes. [place] wrapped around shoulders to mid-chest. Tolerated well. Advised to hold xarelto for 2 days as directed, no shower and return to office on Friday around 8:00-8:15 am to have wound assessed. Verbalized understanding. Instructions read to daughter Lattie Haw Bullins).

## 2018-12-04 NOTE — Progress Notes (Signed)
Per [employer], okay to cancel appt with Charlcie Cradle and will come to [place] office for wound check.

## 2018-12-05 ENCOUNTER — Encounter: Payer: Medicare Other | Admitting: Physician Assistant

## 2018-12-05 ENCOUNTER — Encounter: Payer: Self-pay | Admitting: Internal Medicine

## 2018-12-05 ENCOUNTER — Other Ambulatory Visit: Payer: Self-pay

## 2018-12-05 ENCOUNTER — Ambulatory Visit (INDEPENDENT_AMBULATORY_CARE_PROVIDER_SITE_OTHER): Payer: Medicare Other | Admitting: Internal Medicine

## 2018-12-05 VITALS — BP 152/82 | HR 69 | Ht 68.0 in | Wt 176.0 lb

## 2018-12-05 DIAGNOSIS — I442 Atrioventricular block, complete: Secondary | ICD-10-CM

## 2018-12-05 NOTE — Progress Notes (Signed)
Wound check visit Performed by Chanetta Marshall NP  Small soft hematoma,  Does not require pressure dressing  Hold xarelto and reassess on Tuesday  Thompson Grayer MD, [facility] 12/05/2018 9:25 AM

## 2018-12-05 NOTE — Patient Instructions (Signed)
Medication Instructions:  Continue to hold your Xarelto till Tuesday.  Continue all other medications.    Labwork: none  Testing/Procedures: none  Follow-Up: Already scheduled.   Any Other Special Instructions Will Be Listed Below (If Applicable).  If you need a refill on your cardiac medications before your next appointment, please call your pharmacy.

## 2018-12-09 ENCOUNTER — Other Ambulatory Visit: Payer: Self-pay

## 2018-12-09 ENCOUNTER — Ambulatory Visit (INDEPENDENT_AMBULATORY_CARE_PROVIDER_SITE_OTHER): Payer: Medicare Other | Admitting: *Deleted

## 2018-12-09 ENCOUNTER — Ambulatory Visit: Payer: Medicare Other

## 2018-12-09 DIAGNOSIS — I442 Atrioventricular block, complete: Secondary | ICD-10-CM

## 2018-12-09 NOTE — Patient Instructions (Addendum)
Remove pressure dressing on Friday , 12/12/18. Do not take Eliquis for next 3 days. Resume Xarelto 12/12/18 after dressing removed. Call office if you have increased swelling or drainage from wound site. Follow-up wound check in Monday 12/15/18 at 8:30.

## 2018-12-09 NOTE — Progress Notes (Signed)
Soft raised area over wound site. Steri-strips in place, no drainage. Patient reports no pain and that area is no better or worse from last visit on 12/05/2018. Per Lynnell Jude NP, pressure dressing reapplied and to remain in place until 12/12/18 and hold Eliquis until 12/12/18.F/U wound check scheduled for 12/15/18 to reassess. Patient to contact DC if he has increased edema or drainage from site.

## 2018-12-15 ENCOUNTER — Other Ambulatory Visit: Payer: Self-pay

## 2018-12-15 ENCOUNTER — Ambulatory Visit (INDEPENDENT_AMBULATORY_CARE_PROVIDER_SITE_OTHER): Payer: Medicare Other | Admitting: *Deleted

## 2018-12-15 DIAGNOSIS — I442 Atrioventricular block, complete: Secondary | ICD-10-CM | POA: Diagnosis not present

## 2018-12-15 LAB — CUP PACEART INCLINIC DEVICE CHECK
Battery Remaining Longevity: 90 mo
Battery Voltage: 3.05 V
Brady Statistic RA Percent Paced: 21 %
Brady Statistic RV Percent Paced: 96 %
Date Time Interrogation Session: 20200914092931
Implantable Lead Implant Date: 20140717
Implantable Lead Implant Date: 20140717
Implantable Lead Implant Date: 20200825
Implantable Lead Location: 753858
Implantable Lead Location: 753859
Implantable Lead Location: 753860
Implantable Lead Model: 1948
Implantable Pulse Generator Implant Date: 20200825
Lead Channel Impedance Value: 1012.5 Ohm
Lead Channel Impedance Value: 1012.5 Ohm
Lead Channel Impedance Value: 437.5 Ohm
Lead Channel Impedance Value: 437.5 Ohm
Lead Channel Impedance Value: 637.5 Ohm
Lead Channel Impedance Value: 637.5 Ohm
Lead Channel Pacing Threshold Amplitude: 0.625 V
Lead Channel Pacing Threshold Amplitude: 0.75 V
Lead Channel Pacing Threshold Amplitude: 1 V
Lead Channel Pacing Threshold Pulse Width: 0.4 ms
Lead Channel Pacing Threshold Pulse Width: 0.4 ms
Lead Channel Pacing Threshold Pulse Width: 0.5 ms
Lead Channel Sensing Intrinsic Amplitude: 4.7 mV
Lead Channel Sensing Intrinsic Amplitude: 7.5 mV
Lead Channel Setting Pacing Amplitude: 2 V
Lead Channel Setting Pacing Amplitude: 2.25 V
Lead Channel Setting Pacing Amplitude: 3.5 V
Lead Channel Setting Pacing Pulse Width: 0.4 ms
Lead Channel Setting Pacing Pulse Width: 0.5 ms
Lead Channel Setting Sensing Sensitivity: 4 mV
Pulse Gen Serial Number: 9158588

## 2018-12-15 NOTE — Progress Notes (Signed)
Wound check CRT-P . Strei-strips removed , wound edges approximated, no drainage. Edema at site of incision, slight increase from last check.Dr [name] aware Xarelto on hold until Friday, 12/19/18 . CRT-P device check in clinic. Normal device function. Thresholds, sensing, impedance consistent with previous measurements. Histograms appropriate for patient and level of activity. No mode switches or ventricular high rate episodes. Patient bi-ventricularly pacing 96% of the time. Device programmed with appropriate safety margins. Device heart failure diagnostics are within normal limits and stable over time. Estimated longevity 7 yrs 6 months Patient enrolled in remote follow-up, next remote 02/24/19. Will recheck wound site on 12/17/18 and f/u with Dr [name] on 02/24/19.

## 2018-12-15 NOTE — Patient Instructions (Addendum)
Hold Xarelto until Friday. Call office if swelling increases or drainage occurs at wound site. No lifting, pushing or pulling more than 10 lbs with left arm for next 4 weeks.

## 2018-12-17 ENCOUNTER — Ambulatory Visit (HOSPITAL_COMMUNITY)
Admission: RE | Admit: 2018-12-17 | Discharge: 2018-12-17 | Disposition: A | Discharge status: Home / Self Care | Payer: Medicare Other | Source: Ambulatory Visit | Attending: Cardiology | Admitting: Cardiology

## 2018-12-17 ENCOUNTER — Other Ambulatory Visit: Payer: Self-pay

## 2018-12-17 ENCOUNTER — Ambulatory Visit (INDEPENDENT_AMBULATORY_CARE_PROVIDER_SITE_OTHER): Payer: Medicare Other | Admitting: *Deleted

## 2018-12-17 ENCOUNTER — Encounter (HOSPITAL_COMMUNITY): Payer: Self-pay | Admitting: Cardiology

## 2018-12-17 VITALS — BP 150/72 | HR 69 | Wt 178.0 lb

## 2018-12-17 DIAGNOSIS — I350 Nonrheumatic aortic (valve) stenosis: Secondary | ICD-10-CM | POA: Diagnosis not present

## 2018-12-17 DIAGNOSIS — I130 Hypertensive heart and chronic kidney disease with heart failure and stage 1 through stage 4 chronic kidney disease, or unspecified chronic kidney disease: Secondary | ICD-10-CM | POA: Diagnosis not present

## 2018-12-17 DIAGNOSIS — I255 Ischemic cardiomyopathy: Secondary | ICD-10-CM | POA: Insufficient documentation

## 2018-12-17 DIAGNOSIS — I5022 Chronic systolic (congestive) heart failure: Secondary | ICD-10-CM

## 2018-12-17 DIAGNOSIS — E785 Hyperlipidemia, unspecified: Secondary | ICD-10-CM | POA: Diagnosis not present

## 2018-12-17 DIAGNOSIS — Z951 Presence of aortocoronary bypass graft: Secondary | ICD-10-CM | POA: Diagnosis not present

## 2018-12-17 DIAGNOSIS — I25708 Atherosclerosis of coronary artery bypass graft(s), unspecified, with other forms of angina pectoris: Secondary | ICD-10-CM

## 2018-12-17 DIAGNOSIS — Z950 Presence of cardiac pacemaker: Secondary | ICD-10-CM | POA: Diagnosis not present

## 2018-12-17 DIAGNOSIS — I480 Paroxysmal atrial fibrillation: Secondary | ICD-10-CM | POA: Diagnosis not present

## 2018-12-17 DIAGNOSIS — Z8249 Family history of ischemic heart disease and other diseases of the circulatory system: Secondary | ICD-10-CM | POA: Diagnosis not present

## 2018-12-17 DIAGNOSIS — Z79899 Other long term (current) drug therapy: Secondary | ICD-10-CM | POA: Diagnosis not present

## 2018-12-17 DIAGNOSIS — N183 Chronic kidney disease, stage 3 (moderate): Secondary | ICD-10-CM | POA: Insufficient documentation

## 2018-12-17 DIAGNOSIS — I2510 Atherosclerotic heart disease of native coronary artery without angina pectoris: Secondary | ICD-10-CM | POA: Diagnosis not present

## 2018-12-17 DIAGNOSIS — Z955 Presence of coronary angioplasty implant and graft: Secondary | ICD-10-CM | POA: Insufficient documentation

## 2018-12-17 DIAGNOSIS — Z87891 Personal history of nicotine dependence: Secondary | ICD-10-CM | POA: Diagnosis not present

## 2018-12-17 DIAGNOSIS — Z7901 Long term (current) use of anticoagulants: Secondary | ICD-10-CM | POA: Diagnosis not present

## 2018-12-17 DIAGNOSIS — Z7902 Long term (current) use of antithrombotics/antiplatelets: Secondary | ICD-10-CM | POA: Insufficient documentation

## 2018-12-17 DIAGNOSIS — I442 Atrioventricular block, complete: Secondary | ICD-10-CM

## 2018-12-17 LAB — BASIC METABOLIC PANEL
Anion gap: 11 (ref 5–15)
BUN: 18 mg/dL (ref 8–23)
CO2: 25 mmol/L (ref 22–32)
Calcium: 9.2 mg/dL (ref 8.9–10.3)
Chloride: 101 mmol/L (ref 98–111)
Creatinine, Ser: 1.15 mg/dL (ref 0.61–1.24)
GFR calc Af Amer: >60 mL/min (ref >60)
GFR calc non Af Amer: 60 mL/min — ABNORMAL LOW (ref >60)
Glucose, Bld: 126 mg/dL — ABNORMAL HIGH (ref 70–99)
Potassium: 3.6 mmol/L (ref 3.5–5.1)
Sodium: 137 mmol/L (ref 135–145)

## 2018-12-17 LAB — DIGOXIN LEVEL: Digoxin Level: 0.6 ng/mL — ABNORMAL LOW (ref 0.8–2.0)

## 2018-12-17 MED ORDER — FUROSEMIDE 40 MG PO TABS: ORAL_TABLET | ORAL | 6 refills | Status: DC

## 2018-12-17 MED ORDER — SACUBITRIL-VALSARTAN 24-26 MG PO TABS: 1.0000 | ORAL_TABLET | Freq: Two times a day (BID) | ORAL | 3 refills | Status: DC

## 2018-12-17 NOTE — Patient Instructions (Signed)
Medication Change:  Furosemide was Deceased to 40 mg ( 1 tablet ) in the morning and 20 mg ( 1/2 tablet ) in the Evenings.   STOP taking Losartan.  Start taking ENTRESTO 24/26 ( 1 tablet ) two times a day.  EKG was done today.  Labs were done today. We will ONLY call you if there is anything ABNORMAL. No Call Is A Good Call!!!   Labs will be repeated in 10 days.  Follow up in the Clinic in 1 month.

## 2018-12-17 NOTE — Patient Instructions (Signed)
No Xarelto until speak with device clinic on Friday. Patient agrees to send picture through my chart on Friday or have a video visit . To call device clinic at [phone] when picture sent.

## 2018-12-17 NOTE — Progress Notes (Signed)
PCP: Dione Housekeeper, MD HF Cardiology: Dr. Aundra Dubin  78 y.o. with history of CAD, CHB, paroxysmal atrial fibrillation, and ischemic cardiomyopathy presents for followup of CHF. Patient has a long history of CAD and ischemic CMP.  He had CABG x 3 in 1996.  Echo in 7/18 showed EF 20%.  He developed complete heart block and had St Jude PPM placed.  In 2/20, he was admitted with CHF and chest pain, LHC showed occlusion of sequential SVG-D1/OM with 90% proximal LCx stenosis and 90% proximal LAD stenosis (totally occluded mid LAD with patent LIMA-LAD).  He had DES to proximal/mid LCx and DES to ostial/proximal LAD.  Echo in 2/20 showed EF 20-25% with mild-moderate AS.  He has a history of paroxysmal atrial fibrillation, so was maintained on Plavix + Xarelto after PCI.   He was readmitted for a CHF exacerbation in 8/20.  He was diuresed and had mild AKI.  He had upgrade of his St Jude device to CRT-P.    He is now back at home [place] in [place].  He comes in today with his daughter.  He feels like he is doing well, much less short of breath than prior to last admission.  He is temporarily off Xarelto because of a device site hematoma.  He is in NSR today.  No dyspnea walking on flat ground.  No orthopnea/PND.  He says that he is very active at home and does not want to do cardiac rehab.  Rare atypical chest pain.  No lightheadedness.  No palpitations.  Labs (8/20): K 4.1, creatinine 1.23, hgb 15.2  St Jude device interrogation: 97% BiV paced, no atrial fibrillation  PMH: 1. CAD: s/p CABG x 3 in 1996.  - LHC in 2/20 showed occluded sequential SVG-D1/OM, patent LIMA-LAD, 90% proximal LCx, 90% proximal LAD, totally occluded mid LAD.  He had PCI with DES to ostial LAD to restore flow to diagonal, and PCI with DES to proximal/mid LCx.  2. Complete heart block: s/p St Jude PPM placement, later upgraded to CRT.  3. Gout 4. HTN 5. Vertigo 6. Hyperlipidemia 7. Chronic systolic CHF: Ischemic  cardiomyopathy.   - Echo in 7/18 with EF 20%.  - Echo (2/20) with EF 20-25%, mild-moderate AS.  - Echo (6/20) with EF 20%, moderate AS.  - Echo (8/20) with EF 20%, mild-moderate AS.  - Upgrade of device to [facility] CRT-P in 8/20.  8. Atrial fibrillation: Paroxysmal. 9. Aortic stenosis: Mild to moderate by 8/20 echo.  10. CKD: Stage 3.   Social History   Socioeconomic History  . Marital status: Widowed    Spouse name: Not on file  . Number of children: Not on file  . Years of education: Not on file  . Highest education level: Not on file  Occupational History  . Not on file  Social Needs  . Financial resource strain: Not on file  . Food insecurity    Worry: Not on file    Inability: Not on file  . Transportation needs    Medical: Not on file    Non-medical: Not on file  Tobacco Use  . Smoking status: Former Smoker    Packs/day: 2.00    Years: 40.00    Pack years: 80.00    Types: Cigarettes    Quit date: 04/02/1994    Years since quitting: 24.7  . Smokeless tobacco: Never Used  Substance and Sexual Activity  . Alcohol use: Not Currently    Alcohol/week: 0.0 standard  drinks  . Drug use: No  . Sexual activity: Not Currently  Lifestyle  . Physical activity    Days per week: Not on file    Minutes per session: Not on file  . Stress: Not on file  Relationships  . Social Herbalist on phone: Not on file    Gets together: Not on file    Attends religious service: Not on file    Active member of club or organization: Not on file    Attends meetings of clubs or organizations: Not on file    Relationship status: Not on file  . Intimate partner violence    Fear of current or ex partner: Not on file    Emotionally abused: Not on file    Physically abused: Not on file    Forced sexual activity: Not on file  Other Topics Concern  . Not on file  Social History Narrative  . Not on file   Family History  Problem Relation Age of Onset  . CAD Father   .  Hypertension Father   . CAD Mother   . Diabetes Mother   . Breast cancer Other        Niece  . Colon cancer Neg Hx   . Pancreatic cancer Neg Hx   . Stomach cancer Neg Hx    ROS: All systems reviewed and negative except as per HPI.   Current Outpatient Medications  Medication Sig Dispense Refill  . acetaminophen (TYLENOL) 500 MG tablet Take 1,000 mg by mouth 2 (two) times daily as needed (for leg pain/pain).     . carvedilol (COREG) 3.125 MG tablet Take 1 tablet (3.125 mg total) by mouth 2 (two) times daily with a meal. 60 tablet 0  . clopidogrel (PLAVIX) 75 MG tablet Take 1 tablet (75 mg total) by mouth daily. 30 tablet 11  . digoxin (LANOXIN) 0.125 MG tablet Take 1 tablet (0.125 mg total) by mouth daily. 30 tablet 0  . Doxylamine Succinate, Sleep, (SLEEP AID PO) Take 1 tablet by mouth at bedtime as needed (for sleep).    . furosemide (LASIX) 40 MG tablet Take 1 tablet (40 mg total) by mouth every morning AND 0.5 tablets (20 mg total) every evening. 135 tablet 6  . HYDROcodone-acetaminophen (NORCO/VICODIN) 5-325 MG tablet Take 1-2 tablets by mouth every 4 (four) hours as needed for moderate pain. 14 tablet 0  . Rivaroxaban (XARELTO) 15 MG TABS tablet Take 1 tablet (15 mg total) by mouth daily. 42 tablet 0  . rosuvastatin (CRESTOR) 20 MG tablet Take 1 tablet (20 mg total) by mouth daily at 6 PM. 90 tablet 3  . spironolactone (ALDACTONE) 25 MG tablet Take 0.5 tablets (12.5 mg total) by mouth daily. 15 tablet 11  . sacubitril-valsartan (ENTRESTO) 24-26 MG Take 1 tablet by mouth 2 (two) times daily. 60 tablet 3   No current facility-administered medications for this encounter.    BP (!) 150/72   Pulse 69   Wt 80.7 kg (178 lb)   SpO2 97%   BMI 27.06 kg/m  General: NAD Neck: No JVD, no thyromegaly or thyroid nodule.  Lungs: Clear to auscultation bilaterally with normal respiratory effort. CV: Nondisplaced PMI.  Heart regular S1/S2, no S3/S4, 2/6 SEM RUSB.  No peripheral edema.  No  carotid bruit.  Normal pedal pulses.  Abdomen: Soft, nontender, no hepatosplenomegaly, no distention.  Skin: Intact without lesions or rashes.  Neurologic: Alert and oriented x 3.  Psych: Normal affect. Extremities:  No clubbing or cyanosis.  HEENT: Normal.   Assessment/Plan: 1. Chronic systolic CHF: Ischemic cardiomyopathy.  Echo in 8/20 with EF 20%, mild-moderate AS.  Now s/p St Jude CRT-P upgrade (8/20).  He is appropriately BiV pacing on device interrogation today.  He is not volume overloaded on exam, NYHA class II symptoms.  - Stop losartan, start Entresto 24/26 bid.  BMET today and again in 10 days.  - With use of Entresto, decrease Lasix to 40 qam/20 qpm.  - Continue digoxin, check level today.  - Continue spironolactone 12.5 mg daily.  - Continue Coreg 3.125 mg bid.  2. CHB: Upgraded to CRT-P [facility] Jude).  3. Aortic stenosis: Suspect no more than moderate based 8/20 echo (read as mild-moderate).   4. CAD: s/p CABG 1996.  In 2/20, found to have occlusion of sequential SVG-D and OM.  Therefore, had PCI with DES to ostial LAD and proximal LCx.   - Continue Plavix x 1 year ideally.  - Continue statin.  - No ASA given Xarelto use.  5. Atrial fibrillation: Paroxysmal.  He is in NSR.   - Continue Xarelto 15 mg daily => temporarily held, will restart when ok per EP (device site hematoma).  6. CKD stage 3: BMET today.   Loralie Champagne 12/17/2018

## 2018-12-17 NOTE — Progress Notes (Signed)
Wound site with diffuse edema and bruising . Wound edges approximated , no redness or drainage, no signs of infection. Xarelto on hold. Dr Lovena Le in to see patient. Patient agrees to send picture of wound on Friday through my chart with help of daughter. Will call device clinic when ready to send photo. Patient to hold Xarelto until Friday after  he speaks to the device clinic.

## 2018-12-19 ENCOUNTER — Telehealth: Payer: Self-pay | Admitting: Internal Medicine

## 2018-12-19 NOTE — Telephone Encounter (Signed)
Patients daughter to send p[icture of patient's wound site for evaluation before 12pm. Patient to hold [facility] until picture viewed.

## 2018-12-19 NOTE — Telephone Encounter (Signed)
New Message:    Patient daughter calling stating that patient is suppose to have a virtual visit today. Please call patient daughter.

## 2018-12-19 NOTE — Telephone Encounter (Signed)
Given instructions to resume Xarelto per Lynnell Jude FNP and to send picture of wound on Monday befoe 12 pm to reassess after Xarelto is being taken.

## 2018-12-22 ENCOUNTER — Telehealth: Payer: Self-pay | Admitting: Emergency Medicine

## 2018-12-22 NOTE — Telephone Encounter (Signed)
Returning call. Daughter (per [facility]) informed patient wound site improved and that his next f/u is with Dr [name]  02/24/19. Instructed to call office if he has increased swelling, any drainage or redness at incision site. Reminded that he is not to lift more than 10 lbs with his left arm until 6 weeks after implant. No driving restrictions at this time.

## 2018-12-22 NOTE — Telephone Encounter (Signed)
LMOM to call office. Need  to confirm picture of wound reviewed. To call office if swelling increases or he has drainage from the site.

## 2018-12-29 ENCOUNTER — Other Ambulatory Visit (HOSPITAL_COMMUNITY): Payer: Medicare Other

## 2018-12-29 ENCOUNTER — Telehealth (HOSPITAL_COMMUNITY): Payer: Self-pay | Admitting: *Deleted

## 2018-12-29 NOTE — Telephone Encounter (Signed)
Received a VM that patient would need to cancel lab appt today and they aske dit patient oculd have labs drawn at [facility]. I called back to tell them they can have labs drawn at [place] near [facility] but I would need to fax an order. Left detailed VM for patient to call back.

## 2018-12-30 ENCOUNTER — Other Ambulatory Visit (HOSPITAL_COMMUNITY): Payer: Self-pay | Admitting: *Deleted

## 2018-12-30 DIAGNOSIS — I5022 Chronic systolic (congestive) heart failure: Secondary | ICD-10-CM

## 2018-12-30 NOTE — Progress Notes (Unsigned)
bmet  

## 2019-01-05 ENCOUNTER — Other Ambulatory Visit (HOSPITAL_COMMUNITY): Payer: Self-pay | Admitting: Cardiology

## 2019-01-06 ENCOUNTER — Other Ambulatory Visit: Payer: Self-pay | Admitting: *Deleted

## 2019-01-06 ENCOUNTER — Other Ambulatory Visit: Payer: Self-pay | Admitting: Cardiovascular Disease

## 2019-01-06 MED ORDER — CARVEDILOL 3.125 MG PO TABS: 3.1250 mg | ORAL_TABLET | Freq: Two times a day (BID) | ORAL | 2 refills | Status: DC

## 2019-01-08 ENCOUNTER — Other Ambulatory Visit: Payer: Self-pay

## 2019-01-19 ENCOUNTER — Encounter (HOSPITAL_COMMUNITY): Payer: Medicare Other | Admitting: Cardiology

## 2019-02-04 ENCOUNTER — Telehealth: Payer: Self-pay | Admitting: Internal Medicine

## 2019-02-04 MED ORDER — CARVEDILOL 3.125 MG PO TABS: 3.1250 mg | ORAL_TABLET | Freq: Two times a day (BID) | ORAL | 2 refills | Status: DC

## 2019-02-04 MED ORDER — DIGOXIN 125 MCG PO TABS: 0.1250 mg | ORAL_TABLET | Freq: Every day | ORAL | 2 refills | Status: DC

## 2019-02-04 NOTE — Telephone Encounter (Signed)
Asking about medication changes since he was out of hospital

## 2019-02-24 ENCOUNTER — Ambulatory Visit (INDEPENDENT_AMBULATORY_CARE_PROVIDER_SITE_OTHER): Payer: Medicare Other | Admitting: *Deleted

## 2019-02-24 DIAGNOSIS — I442 Atrioventricular block, complete: Secondary | ICD-10-CM | POA: Diagnosis not present

## 2019-02-24 LAB — CUP PACEART REMOTE DEVICE CHECK
Battery Remaining Longevity: 89 mo
Battery Remaining Percentage: 95.5 %
Battery Voltage: 3.01 V
Brady Statistic AP VP Percent: 23 %
Brady Statistic AP VS Percent: 1 %
Brady Statistic AS VP Percent: 71 %
Brady Statistic AS VS Percent: 1.1 %
Brady Statistic RA Percent Paced: 16 %
Date Time Interrogation Session: 20201124020010
Implantable Lead Implant Date: 20140717
Implantable Lead Implant Date: 20140717
Implantable Lead Implant Date: 20200825
Implantable Lead Location: 753858
Implantable Lead Location: 753859
Implantable Lead Location: 753860
Implantable Lead Model: 1948
Implantable Pulse Generator Implant Date: 20200825
Lead Channel Impedance Value: 1075 Ohm
Lead Channel Impedance Value: 410 Ohm
Lead Channel Impedance Value: 640 Ohm
Lead Channel Pacing Threshold Amplitude: 0.625 V
Lead Channel Pacing Threshold Amplitude: 0.75 V
Lead Channel Pacing Threshold Amplitude: 1 V
Lead Channel Pacing Threshold Pulse Width: 0.4 ms
Lead Channel Pacing Threshold Pulse Width: 0.4 ms
Lead Channel Pacing Threshold Pulse Width: 0.5 ms
Lead Channel Sensing Intrinsic Amplitude: 10.9 mV
Lead Channel Sensing Intrinsic Amplitude: 3.7 mV
Lead Channel Setting Pacing Amplitude: 2 V
Lead Channel Setting Pacing Amplitude: 2.25 V
Lead Channel Setting Pacing Amplitude: 3.5 V
Lead Channel Setting Pacing Pulse Width: 0.4 ms
Lead Channel Setting Pacing Pulse Width: 0.5 ms
Lead Channel Setting Sensing Sensitivity: 4 mV
Pulse Gen Serial Number: 9158588

## 2019-02-25 ENCOUNTER — Encounter: Payer: Self-pay | Admitting: Internal Medicine

## 2019-02-25 ENCOUNTER — Other Ambulatory Visit: Payer: Self-pay

## 2019-02-25 ENCOUNTER — Ambulatory Visit (INDEPENDENT_AMBULATORY_CARE_PROVIDER_SITE_OTHER): Payer: Medicare Other | Admitting: Internal Medicine

## 2019-02-25 VITALS — BP 122/68 | HR 68 | Ht 68.0 in | Wt 183.0 lb

## 2019-02-25 DIAGNOSIS — I5022 Chronic systolic (congestive) heart failure: Secondary | ICD-10-CM

## 2019-02-25 DIAGNOSIS — I442 Atrioventricular block, complete: Secondary | ICD-10-CM | POA: Diagnosis not present

## 2019-02-25 DIAGNOSIS — I25708 Atherosclerosis of coronary artery bypass graft(s), unspecified, with other forms of angina pectoris: Secondary | ICD-10-CM | POA: Diagnosis not present

## 2019-02-25 LAB — CUP PACEART INCLINIC DEVICE CHECK
Date Time Interrogation Session: 20201125142758
Implantable Lead Implant Date: 20140717
Implantable Lead Implant Date: 20140717
Implantable Lead Implant Date: 20200825
Implantable Lead Location: 753858
Implantable Lead Location: 753859
Implantable Lead Location: 753860
Implantable Lead Model: 1948
Implantable Pulse Generator Implant Date: 20200825
Pulse Gen Serial Number: 9158588

## 2019-02-25 MED ORDER — CARVEDILOL 6.25 MG PO TABS: 6.2500 mg | ORAL_TABLET | Freq: Two times a day (BID) | ORAL | 6 refills | Status: DC

## 2019-02-25 NOTE — Patient Instructions (Addendum)
Medication Instructions:   Increase Coreg to 6.205mg  twice a day.    Continue all other medications.    Labwork: none  Testing/Procedures: none  Follow-Up:  CHF clinic in next couple weeks for chest pain.   Amber in [place] in 3 months with Echo   Any Other Special Instructions Will Be Listed Below (If Applicable). Enroll in CHF clinic with Sharman Cheek, RN   If you need a refill on your cardiac medications before your next appointment, please call your pharmacy.

## 2019-02-25 NOTE — Progress Notes (Signed)
PCP: Dione Housekeeper, MD Primary Cardiologist: Dr Bronson Ing Primary EP:  Dr [name]  Ricardo Hunt is a 78 y.o. male who presents today for routine electrophysiology followup.  Since his CRT-P upgrade, the patient reports doing reasonably well.  He has had some episodes of exertional chest pain recently.  Episodes resolve with rest.  Today, he denies symptoms of palpitations, shortness of breath,  lower extremity edema, dizziness, presyncope, or syncope.  The patient is otherwise without complaint today.   Past Medical History:  Diagnosis Date  . Arthritis    " all over the body"  . Atrial fibrillation and flutter ([place])    seen on PPM interrogation; Rivaroxaban started 04/2018  . Chronic systolic CHF (congestive heart failure) ([place])    Echo 05/2018: EF 20-25, severe LVE, diffuse HK with septal and apical akinesis, moderate LAE, moderate MAC, severe aortic valve calcification - degree of AS diff to judge with low EF (likely mild to mod)  . Coronary artery disease    s/p CABG 1996 // LHC 05/2018: oLAD 90, pLAD 100 after Dx, LCx 90 at bifurcation of OM1/OM2, mRCA 50; L-LAD ok; S-D1/OM2 100 >> PCI: DES to mLCx and orbital atherectomy and DES to oLAD  . GERD (gastroesophageal reflux disease)   . Hyperlipidemia   . Hypertension   . Mobitz (type) II atrioventricular block    s/p STJ Accent pacemaker implanted by Dr [name] 10-16-2012  . Myocardial infarction [facility])    per pt., told that that the stress test shows that there is a weakening evident on the stress test  . Pacemaker 10/16/2012   St. Jude   . Personal history of colonic polyps-tubular adenomas 08/31/2013  . Vertigo    Past Surgical History:  Procedure Laterality Date  . BIV UPGRADE N/A 11/25/2018   Procedure: BIV UPGRADE;  Surgeon: Thompson Grayer, MD;  Location: [place] CV LAB;  Service: Cardiovascular;  Laterality: N/A;  . cardiac bypass  1996   3 vessels  . CATARACT EXTRACTION W/ INTRAOCULAR LENS  IMPLANT, BILATERAL Bilateral  2010  . COLONOSCOPY    . CORONARY ARTERY BYPASS GRAFT    . CORONARY ATHERECTOMY N/A 05/23/2018   Procedure: CORONARY ATHERECTOMY;  Surgeon: [name], Peter M, MD;  Location: [place] CV LAB;  Service: Cardiovascular;  Laterality: N/A;  . CORONARY STENT INTERVENTION N/A 05/23/2018   Procedure: CORONARY STENT INTERVENTION;  Surgeon: [name], Peter M, MD;  Location: [place] CV LAB;  Service: Cardiovascular;  Laterality: N/A;  . INGUINAL HERNIA REPAIR Left 2010  . LUMBAR LAMINECTOMY/DECOMPRESSION MICRODISCECTOMY Right 07/19/2016   Procedure: Right Lumbar Four-Five Hemilaminectomy and Bilateral Lumbar Two-Three Laminectomy;  Surgeon: Eustace Moore, MD;  Location: [place];  Service: Neurosurgery;  Laterality: Right;  Right Lumbar Four-Five Hemilaminectomy and Bilateral Lumbar Two-Three Laminectomy  . LUMBAR SPINE SURGERY  2006  . NASAL SEPTUM SURGERY  09/01/13   Dr. Adriana Reams  . PACEMAKER INSERTION  10-16-2012   STJ Accent dual chamber pacemaker implanted by Dr [name] for symptomatic Mobitz II heart block  . PERMANENT PACEMAKER INSERTION N/A 10/16/2012   Procedure: PERMANENT PACEMAKER INSERTION;  Surgeon: Thompson Grayer, MD;  Location: [facility] CATH LAB;  Service: Cardiovascular;  Laterality: N/A;  . RIGHT/LEFT HEART CATH AND CORONARY/GRAFT ANGIOGRAPHY N/A 05/20/2018   Procedure: RIGHT/LEFT HEART CATH AND CORONARY/GRAFT ANGIOGRAPHY;  Surgeon: [name], Peter M, MD;  Location: [place] CV LAB;  Service: Cardiovascular;  Laterality: N/A;    ROS- all systems are reviewed and negative except as per  HPI above  Current Outpatient Medications  Medication Sig Dispense Refill  . acetaminophen (TYLENOL) 500 MG tablet Take 1,000 mg by mouth 2 (two) times daily as needed (for leg pain/pain).     . carvedilol (COREG) 3.125 MG tablet Take 1 tablet (3.125 mg total) by mouth 2 (two) times daily with a meal. 180 tablet 2  . clopidogrel (PLAVIX) 75 MG tablet Take 1 tablet (75 mg total) by mouth daily. 30 tablet 11  .  digoxin (LANOXIN) 0.125 MG tablet Take 1 tablet (0.125 mg total) by mouth daily. 90 tablet 2  . Doxylamine Succinate, Sleep, (SLEEP AID PO) Take 1 tablet by mouth at bedtime as needed (for sleep).    . furosemide (LASIX) 40 MG tablet Take 1 tablet (40 mg total) by mouth every morning AND 0.5 tablets (20 mg total) every evening. 135 tablet 6  . HYDROcodone-acetaminophen (NORCO/VICODIN) 5-325 MG tablet Take 1-2 tablets by mouth every 4 (four) hours as needed for moderate pain. 14 tablet 0  . Rivaroxaban (XARELTO) 15 MG TABS tablet Take 1 tablet (15 mg total) by mouth daily. 42 tablet 0  . rosuvastatin (CRESTOR) 20 MG tablet Take 1 tablet (20 mg total) by mouth daily at 6 PM. 90 tablet 3  . sacubitril-valsartan (ENTRESTO) 24-26 MG Take 1 tablet by mouth 2 (two) times daily. 60 tablet 3  . spironolactone (ALDACTONE) 25 MG tablet Take 0.5 tablets (12.5 mg total) by mouth daily. 15 tablet 11   No current facility-administered medications for this visit.     Physical Exam: Vitals:   02/25/19 1349  BP: 122/68  Pulse: 68  SpO2: 97%  Weight: 183 lb (83 kg)  Height: _0  (1.727 m)    GEN- The patient is well appearing, alert and oriented x 3 today.   Head- normocephalic, atraumatic Eyes-  Sclera clear, conjunctiva pink Ears- hearing intact Oropharynx- clear Lungs-  normal work of breathing Chest- pacemaker pocket is well healed Heart- Regular rate and rhythm  GI- soft  Extremities- no clubbing, cyanosis, or edema  Pacemaker interrogation- reviewed in detail today,  See PACEART report  ekg tracing ordered today is personally reviewed and shows sinus with BiV pacing, PVCs  Assessment and Plan:  1.  complete heart block Normal biv pacemaker function See Pace Art report No changes today he is not device dependant today  2. Chronic systolic dysfunction/ ischemic CM/CAD Doing well s/p CRT-P upgrade He has been having some ischemic symptoms Cath 05/2018 reviewed Will increase coreg to  6.25m BID today for anginal therapy.  Will need close follow-up with primary cardiologist (he reports this is Dr MAundra Dubin for medicine titration for angina. CHF has improved with CRT. [facility] LSharman CheekRepeat echo in 3 months with follow-up in device clinic with EP APP to determine if further optimization is required.  3. Persistent afib On xarelto for chads2vasc score of 5  Follow-up with Dr MOleh Geninteam for angina management Return to see me in a year  JThompson GrayerMD, FMedina Memorial Hospital11/25/2020 2:12 PM

## 2019-03-03 NOTE — Addendum Note (Signed)
Addended by: Merlene Laughter on: 03/03/2019 08:59 AM   Modules accepted: Orders

## 2019-03-11 ENCOUNTER — Other Ambulatory Visit: Payer: Self-pay | Admitting: Internal Medicine

## 2019-03-11 NOTE — Telephone Encounter (Signed)
This is a [place] pt °

## 2019-03-20 ENCOUNTER — Telehealth: Payer: Self-pay

## 2019-03-20 NOTE — Telephone Encounter (Signed)
Referred to ICM clinic by Dr [name].  Attempted ICM intro referral call and left message to return call with call back number.      Last [facility] Thoracic Impedance update 03/16/2019.    Impedance suggests possible fluid accumulation between 11/24 - 12/5 but returned to normal 03/07/2019.    Patient has office visit scheduled with Dr Bronson Ing 03/26/2019.

## 2019-03-24 NOTE — Progress Notes (Signed)
PPM remote 

## 2019-03-26 ENCOUNTER — Ambulatory Visit: Payer: Medicare Other | Admitting: Cardiovascular Disease

## 2019-03-30 ENCOUNTER — Encounter: Payer: Self-pay | Admitting: Cardiovascular Disease

## 2019-04-06 NOTE — Telephone Encounter (Signed)
Attempted ICM intro call and unable to reach at this time for enrollment.

## 2019-04-13 ENCOUNTER — Telehealth (HOSPITAL_COMMUNITY): Payer: Self-pay | Admitting: Vascular Surgery

## 2019-04-13 NOTE — Telephone Encounter (Signed)
Left pt message to make f/u appt w/ [facility] 

## 2019-04-27 NOTE — Telephone Encounter (Signed)
Encounter open in error 

## 2019-04-30 ENCOUNTER — Telehealth: Payer: Self-pay | Admitting: Cardiovascular Disease

## 2019-04-30 NOTE — Telephone Encounter (Signed)
I agree, I think he would benefit from an ED evaluation.

## 2019-04-30 NOTE — Telephone Encounter (Signed)
Patient states that he is having a lot of shortness of breath - Very thirsty

## 2019-04-30 NOTE — Telephone Encounter (Signed)
Ricardo Hunt (daughter) calling for her dad.  Last 2-3 days SOB, c/o being extremely thirsty.  Vomit x 1 last evening - green.  Looks & feels very weak.  Patient is not diabetic.  Complain of just not feeling right.  Drinking & eating like normal, but can not quench his thirst.  Daughter states that she has tried numerous times to get him to go to ED & he will not.  Requested to be seen in person.    Scheduled OV for tomorrow with Bernerd Pho, PA in [place] office.    Informed her that message will be sent to provider, but in the meantime if symptoms worsen - do not hesitate to take him to ED for evaluation.  She verbalized understanding.

## 2019-04-30 NOTE — Telephone Encounter (Signed)
Left detailed message on daughter Lattie Haw) voicemail.

## 2019-05-01 ENCOUNTER — Encounter: Payer: Self-pay | Admitting: Student

## 2019-05-01 ENCOUNTER — Ambulatory Visit (INDEPENDENT_AMBULATORY_CARE_PROVIDER_SITE_OTHER): Payer: Medicare Other | Admitting: Student

## 2019-05-01 ENCOUNTER — Other Ambulatory Visit: Payer: Self-pay

## 2019-05-01 VITALS — BP 154/84 | HR 70 | Temp 97.8°F | Ht 68.0 in | Wt 168.0 lb

## 2019-05-01 DIAGNOSIS — I25708 Atherosclerosis of coronary artery bypass graft(s), unspecified, with other forms of angina pectoris: Secondary | ICD-10-CM

## 2019-05-01 DIAGNOSIS — E785 Hyperlipidemia, unspecified: Secondary | ICD-10-CM

## 2019-05-01 DIAGNOSIS — N183 Chronic kidney disease, stage 3 unspecified: Secondary | ICD-10-CM

## 2019-05-01 DIAGNOSIS — I442 Atrioventricular block, complete: Secondary | ICD-10-CM | POA: Diagnosis not present

## 2019-05-01 DIAGNOSIS — I480 Paroxysmal atrial fibrillation: Secondary | ICD-10-CM

## 2019-05-01 DIAGNOSIS — I5042 Chronic combined systolic (congestive) and diastolic (congestive) heart failure: Secondary | ICD-10-CM

## 2019-05-01 DIAGNOSIS — I10 Essential (primary) hypertension: Secondary | ICD-10-CM

## 2019-05-01 NOTE — Patient Instructions (Signed)
Medication Instructions:   Please re-start Xarelto 15 mg daily at dinner  *If you need a refill on your cardiac medications before your next appointment, please call your pharmacy*  Lab Work: CBC,BMET, Digoxin level, HgbA1C please have lab work drawn the day of your apt on 05/05/2019 with Dr.McLean  If you have labs (blood work) drawn today and your tests are completely normal, you will receive your results only by: Marland Kitchen MyChart Message (if you have MyChart) OR . A paper copy in the mail If you have any lab test that is abnormal or we need to change your treatment, we will call you to review the results.  Testing/Procedures: None today  Follow-Up: At [facility], you and your health needs are our priority.  As part of our continuing mission to provide you with exceptional heart care, we have created designated Provider Care Teams.  These Care Teams include your primary Cardiologist (physician) and Advanced Practice Providers (APPs -  Physician Assistants and Nurse Practitioners) who all work together to provide you with the care you need, when you need it.  Your next appointment:   2-3  month(s)  The format for your next appointment:   In Person  Provider:   You may see Kate Sable, MD        Other Instructions NONE

## 2019-05-01 NOTE — Progress Notes (Signed)
Cardiology Office Note    Date:  05/02/2019   ID:  Ricardo Hunt, DOB 21-Jul-1940, MRN [id number]  PCP:  Dione Housekeeper, MD  Cardiologist: Kate Sable, MD   Advanced Heart Failure: Dr. Aundra Dubin EP:  Dr. [name]  Chief Complaint  Patient presents with  . Follow-up    worsening dyspnea on exertion    History of Present Illness:    Ricardo Hunt is a 79 y.o. male with past medical history of CAD (s/p CABG in 1996 with cath in 05/2018 showing patent LIMA-LAD and occluded seq-SVG-D1-OM2 with staged PCI that admission of DES to mid-LCx and orbital atherectomy and DES to ostial-LAD), chronic combined systolic and diastolic CHF (EF 99% by imaging in 11/2018), CHB (s/p PPM placement in 2014 with CRT-P upgrade in 11/2018 by Dr. [name]), paroxysmal atrial fibrillation/flutter (on Xarelto), HTN, HLD and Stage 3 CKD who presents to the office today for evaluation of worsening dyspnea.   He was last examined by Dr. Aundra Dubin in 12/2018 following his recent admission for a CHF exacerbation and recent upgrade to CRT-P.  He reported overall doing well and had experienced significant improvement in his dyspnea.  Recent device interrogation showed 97% BiV pacing with no recurrent atrial fibrillation.  Losartan was discontinued and he was started on Entresto 24-70m BID with Lasix being reduced to 470min AM/2028mn OM. Was continued on Digoxin, Spironolactone 12.5mg36mily and Coreg 3.125mg17m. Was also on Plavix and statin therapy (not on ASA given the use of anticoagulation).   He did follow-up with Dr. AllreRayann Heman1/2020 and reported some episodes of exertional chest discomfort, therefore Coreg was increased to 6.25 mg twice daily for antianginal benefit.  Close follow-up with the heart failure clinic was recommended but he has not been evaluated since.  His daughter called the office yesterday reporting he was having worsening shortness of breath over the past 2 to 3 days with increased weakness and  decreased appetite. He was advised to go to the ED for further evaluation but he declined and a follow-up visit was scheduled for today.   In talking with the patient today, he reports having worsening dyspnea on exertion for the past several weeks when doing chores around him home. No specific orthopnea, PND or lower extremity edema. He does have stable angina and reports symptoms resolve with rest. Has not utilized SL NTG recently. No recent palpitations, dizziness or presyncope.   He does report having nausea and vomiting two days ago and this spontaneously resolved. No known sick contacts. He reports being constantly thirsty but says his overall appetite has decreased in the past few months.   He did self-discontinue Entresto and Xarelto due to cost. Says he has not been on both in several months. His PCP also retired last fall and he has not established care with a new provider.   Past Medical History:  Diagnosis Date  . Arthritis    " all over the body"  . Atrial [place] and [place] (HCC[place]; Rivaroxaban started 04/2018  . Chronic systolic CHF (congestive heart failure) (HCC) [place] 05[place]: EF 20-25, severe LVE, diffuse HK with septal and apical akinesis, moderate LAE, moderate MAC, severe aortic valve calcification - degree of AS diff to judge with low EF (likely mild to mod)  . Coronary artery disease    s/p CABG 1996 // [facility] 05/2018: oLAD 90, pLAD 100 after Dx, LCx 90 at bifurcation of OM1/OM2, mRCA  1; L-LAD ok; S-D1/OM2 100 >> PCI: DES to mLCx and orbital atherectomy and DES to oLAD  . GERD (gastroesophageal reflux disease)   . Hyperlipidemia   . Hypertension   . Mobitz (type) II atrioventricular block    s/p STJ Accent pacemaker implanted by Dr [name] 10-16-2012  . Myocardial infarction [facility])    per pt., told that that the stress test shows that there is a weakening evident on the stress test  . Pacemaker 10/16/2012   St. Jude   . Personal history of  colonic polyps-tubular adenomas 08/31/2013  . Vertigo     Past Surgical History:  Procedure Laterality Date  . BIV UPGRADE N/A 11/25/2018   Procedure: BIV UPGRADE;  Surgeon: Thompson Grayer, MD;  Location: [place] [facility];  Service: Cardiovascular;  Laterality: N/A;  . cardiac bypass  1996   3 vessels  . CATARACT EXTRACTION W/ INTRAOCULAR LENS  IMPLANT, BILATERAL Bilateral 2010  . COLONOSCOPY    . CORONARY ARTERY BYPASS GRAFT    . CORONARY ATHERECTOMY N/A 05/23/2018   Procedure: CORONARY ATHERECTOMY;  Surgeon: [name], Peter M, MD;  Location: [place] [facility];  Service: Cardiovascular;  Laterality: N/A;  . CORONARY STENT INTERVENTION N/A 05/23/2018   Procedure: CORONARY STENT INTERVENTION;  Surgeon: [name], Peter M, MD;  Location: [place] [facility];  Service: Cardiovascular;  Laterality: N/A;  . INGUINAL HERNIA REPAIR Left 2010  . LUMBAR LAMINECTOMY/DECOMPRESSION MICRODISCECTOMY Right 07/19/2016   Procedure: Right Lumbar Four-Five Hemilaminectomy and Bilateral Lumbar Two-Three Laminectomy;  Surgeon: Eustace Moore, MD;  Location: [place];  Service: Neurosurgery;  Laterality: Right;  Right Lumbar Four-Five Hemilaminectomy and Bilateral Lumbar Two-Three Laminectomy  . LUMBAR SPINE SURGERY  2006  . NASAL SEPTUM SURGERY  09/01/13   Dr. Adriana Reams  . PACEMAKER INSERTION  10-16-2012   STJ Accent dual chamber pacemaker implanted by Dr [name] for symptomatic Mobitz II heart block  . PERMANENT PACEMAKER INSERTION N/A 10/16/2012   Procedure: PERMANENT PACEMAKER INSERTION;  Surgeon: Thompson Grayer, MD;  Location: [facility] C[facility] LAB;  Service: Cardiovascular;  Laterality: N/A;  . RIGHT/LEFT HEART CATH AND CORONARY/GRAFT ANGIOGRAPHY N/A 05/20/2018   Procedure: RIGHT/LEFT HEART CATH AND CORONARY/GRAFT ANGIOGRAPHY;  Surgeon: [name], Peter M, MD;  Location: [place] [facility];  Service: Cardiovascular;  Laterality: N/A;    Current Medications: Outpatient Medications Prior to Visit  Medication Sig Dispense Refill    . acetaminophen (TYLENOL) 500 MG tablet Take 1,000 mg by mouth 2 (two) times daily as needed (for leg pain/pain).     . Doxylamine Succinate, Sleep, (SLEEP AID PO) Take 1 tablet by mouth at bedtime as needed (for sleep).    Marland Kitchen HYDROcodone-acetaminophen (NORCO/VICODIN) 5-325 MG tablet Take 1-2 tablets by mouth every 4 (four) hours as needed for moderate pain. 14 tablet 0  . Rivaroxaban (XARELTO) 15 MG TABS tablet Take 1 tablet (15 mg total) by mouth daily. 42 tablet 0  . sacubitril-valsartan (ENTRESTO) 24-26 MG Take 1 tablet by mouth 2 (two) times daily. 60 tablet 3  . XARELTO 20 MG TABS tablet TAKE (1) TABLET DAILY WITH SUPPER. 90 tablet 3  . carvedilol (COREG) 6.25 MG tablet Take 1 tablet (6.25 mg total) by mouth 2 (two) times daily with a meal. 60 tablet 6  . clopidogrel (PLAVIX) 75 MG tablet Take 1 tablet (75 mg total) by mouth daily. 30 tablet 11  . digoxin (LANOXIN) 0.125 MG tablet Take 1 tablet (0.125 mg total) by mouth daily. 90 tablet 2  . furosemide (  LASIX) 40 MG tablet Take 1 tablet (40 mg total) by mouth every morning AND 0.5 tablets (20 mg total) every evening. 135 tablet 6  . rosuvastatin (CRESTOR) 20 MG tablet Take 1 tablet (20 mg total) by mouth daily at 6 PM. 90 tablet 3  . spironolactone (ALDACTONE) 25 MG tablet Take 0.5 tablets (12.5 mg total) by mouth daily. 15 tablet 11   No facility-administered medications prior to visit.     Allergies:   Patient has no known allergies.   Social History   Socioeconomic History  . Marital status: Widowed    Spouse name: Not on file  . Number of children: Not on file  . Years of education: Not on file  . Highest education level: Not on file  Occupational History  . Not on file  Tobacco Use  . Smoking status: Former Smoker    Packs/day: 2.00    Years: 40.00    Pack years: 80.00    Types: Cigarettes    Quit date: 04/02/1994    Years since quitting: 25.0  . Smokeless tobacco: Never Used  Substance and Sexual Activity  . Alcohol  use: Not Currently    Alcohol/week: 0.0 standard drinks  . Drug use: No  . Sexual activity: Not Currently  Other Topics Concern  . Not on file  Social History Narrative  . Not on file   Social Determinants of Health   Financial Resource Strain:   . Difficulty of Paying Living Expenses: Not on file  Food Insecurity:   . Worried About Charity fundraiser in the Last Year: Not on file  . Ran Out of Food in the Last Year: Not on file  Transportation Needs:   . Lack of Transportation (Medical): Not on file  . Lack of Transportation (Non-Medical): Not on file  Physical Activity:   . Days of Exercise per Week: Not on file  . Minutes of Exercise per Session: Not on file  Stress:   . Feeling of Stress : Not on file  Social Connections:   . Frequency of Communication with Friends and Family: Not on file  . Frequency of Social Gatherings with Friends and Family: Not on file  . Attends Religious Services: Not on file  . Active Member of Clubs or Organizations: Not on file  . Attends Archivist Meetings: Not on file  . Marital Status: Not on file     Family History:  The patient's family history includes Breast cancer in an other family member; CAD in his father and mother; Diabetes in his mother; Hypertension in his father.   Review of Systems:   Please see the history of present illness.     General:  No chills, fever, or night sweats. Positive for weight loss.  Cardiovascular:  No chest pain, edema, orthopnea, palpitations, paroxysmal nocturnal dyspnea. Positive for dyspnea on exertion.  Dermatological: No rash, lesions/masses Respiratory: No cough, dyspnea Urologic: No hematuria, dysuria Abdominal:   No nausea, vomiting, diarrhea, bright red blood per rectum, melena, or hematemesis Neurologic:  No visual changes, wkns, changes in mental status. All other systems reviewed and are otherwise negative except as noted above.   Physical Exam:    VS:  BP (!) 154/84    Pulse 70   Temp 97.8 F (36.6 C)   Ht _0  (1.727 m)   Wt 168 lb (76.2 kg)   SpO2 97%   BMI 25.54 kg/m    General: Well developed, well nourished,male appearing in  no acute distress. Head: Normocephalic, atraumatic, sclera non-icteric, no xanthomas, nares are without discharge.  Neck: No carotid bruits. JVD not elevated.  Lungs: Respirations regular and unlabored, without wheezes or rales.  Heart: Regular rate and rhythm. No S3 or S4.  No murmur, no rubs, or gallops appreciated. Abdomen: Soft, non-tender, non-distended with normoactive bowel sounds. No hepatomegaly. No rebound/guarding. No obvious abdominal masses. Msk:  Strength and tone appear normal for age. No joint deformities or effusions. Extremities: No clubbing or cyanosis. No lower extremity edema.  Distal pedal pulses are 2+ bilaterally. Neuro: Alert and oriented X 3. Moves all extremities spontaneously. No focal deficits noted. Psych:  Responds to questions appropriately with a normal affect. Skin: No rashes or lesions noted  Wt Readings from Last 3 Encounters:  05/01/19 168 lb (76.2 kg)  02/25/19 183 lb (83 kg)  12/17/18 178 lb (80.7 kg)     Studies/Labs Reviewed:   EKG:  EKG is not ordered today.   Recent Labs: 11/16/2018: B Natriuretic Peptide 783.8 11/19/2018: Magnesium 2.2 11/27/2018: Hemoglobin 15.2; Platelets 193 12/17/2018: BUN 18; Creatinine, Ser 1.15; Potassium 3.6; Sodium 137   Lipid Panel    Component Value Date/Time   CHOL 147 05/21/2018 0010   TRIG 192 (H) 05/21/2018 0010   HDL 29 (L) 05/21/2018 0010   CHOLHDL 5.1 05/21/2018 0010   VLDL 38 05/21/2018 0010   LDLCALC 80 05/21/2018 0010    Additional studies/ records that were reviewed today include:   Cardiac Catheterization: 05/2018  Prox RCA lesion is 40% stenosed.  Mid RCA lesion is 50% stenosed.  Ost LM to Mid LM lesion is 30% stenosed.  Dist LM to Ost LAD lesion is 90% stenosed.  Prox LAD lesion is 100% stenosed.  Prox Cx lesion  is 90% stenosed.  Origin to Prox Graft lesion before Ost 1st Diag is 100% stenosed.  LIMA graft was visualized by angiography and is normal in caliber.  The graft exhibits no disease.  There is severe left ventricular systolic dysfunction.  LV end diastolic pressure is moderately elevated.  The left ventricular ejection fraction is less than 25% by visual estimate.  There is no aortic valve stenosis.  Hemodynamic findings consistent with mild pulmonary hypertension.   1. Severe 2 vessel obstructive CAD.     - 90% ostial LAD supplying a large first diagonal    - 100% LAD after the first diagonal.    - 90% LCx at bifurcation of OM1 and OM2    - 50% mid RCA 2. Patent LIMA to the LAD 3. Occluded sequential SVG to the first diagonal and OM2 4. Severe LV dysfunction 5. Mild pulmonary venous HTN 6. Elevated LV filling pressures 7. Cardiac index 2.34  Plan: will admit to telemetry. Consult advanced heart failure team to optimize CHF therapy. Will load with Plavix. Anticipate complex PCI later this week including stenting of the LCx and atherectomy and stenting of the left main/ostial LAD.   Coronary Stent Intervention: 05/2018  Ost LM to Mid LM lesion is 30% stenosed.  Dist LM to Ost LAD lesion is 90% stenosed.  A drug-eluting stent was successfully placed using a STENT RESOLUTE ONYX 2.5X26.  Post intervention, there is a 0% residual stenosis.  Prox Cx lesion is 90% stenosed.  Post intervention, there is a 0% residual stenosis.  A drug-eluting stent was successfully placed using a STENT RESOLUTE ONYX 2.5X15.   1. Successful PCI of the proximal to mid LCx with DES x 1 2. Successful PCI of  the ostial LAD with orbital atherectomy and DES x 1.   Plan: DAPT with ASA and Plavix. Continue ASA for one month then discontinue. Continue Plavix for one year. May resume concomitant DOAC therapy tomorrow if stable. Further management per Advanced heart failure team  Echocardiogram:  11/2018 IMPRESSIONS    1. The left ventricle has a visually estimated ejection fraction of 20%.  The cavity size was moderately dilated. There is mildly increased left  ventricular wall thickness. Left ventricular diastolic Doppler parameters  are consistent with impaired  relaxation.  2. There is akinesis of the left ventricular, entire inferior wall.  3. There is akinesis of the left ventricular, mid-apical anteroseptal  wall and apical segment.  4. Left atrial size was mildly dilated.  5. The aortic valve is tricuspid. Mild calcification of the aortic valve.  Mild-moderate stenosis of the aortic valve. Moderate aortic annular  calcification noted.  6. The mitral valve is degenerative. Mild calcification of the mitral  valve leaflet. There is moderate mitral annular calcification present.  7. The tricuspid valve is grossly normal.  8. The aorta is abnormal unless otherwise noted.  9. There is mild dilatation of the aortic root.  Assessment:    1. Chronic combined systolic and diastolic heart failure ([place])   2. Coronary artery disease of bypass graft of native heart with stable angina pectoris ([place])   3. Complete heart block ([place])   4. PAF (paroxysmal atrial fibrillation) ([place])   5. Essential hypertension   6. Hyperlipidemia LDL goal <70   7. Stage 3 chronic kidney disease, unspecified whether stage 3a or 3b CKD      Plan:   In order of problems listed above:  1. Chronic Combined Systolic and Diastolic CHF - he has a known reduced EF of 20% by most recent echocardiogram. He self-discontinued Entresto several months ago due to cost and given this, increased his Lasix to 38m BID. He does not appear volume overloaded on examination and given his reports of polydipsia, I recommended he reduce this back to 456min AM/2059mn PM as I concerned he is actually dehydrated. Will check BMET along with Hgb A1c and Dig Level as he has not had any routine labs since his PCP  retired.  - will ask nursing staff to provide Entresto samples of 24-40m60mD if any are available and start the process for patient assistance. If unable to get approved, may need to restart Losartan if Entresto remains cost-prohibitive. Continue Coreg 6.25mg36m, Digoxin 0.125mg 44my and Spironolactone 12.5mg da57m. Will not further titrate until we can see how BP responds to restarting Entresto.   2. CAD - s/p CABG in 1996 with cath in 05/2018 showing patent LIMA-LAD and occluded seq-SVG-D1-OM2 with staged PCI that admission with DES to mid-LCx and orbital atherectomy and DES to ostial-LAD.  - he has stable angina but has not utilized SL NTG recently.  - continue Plavix, BB and statin therapy  3. CHB - s/p PPM placement in 2014 with CRT-P upgrade in 11/2018 by Dr. Allred.Rayann Hemanue for remote check and scheduled for later this month.   4. Paroxysmal Atrial Fibrillation/Flutter - he denies any recent palpitations. Continue Coreg and Digoxin for rate control. He did self-discontinue Xarelto due to cost. Will again see if samples of this are available and ask nursing staff to help with patient assistance options. Check CBC and BMET. Based off most recent labs and calculated creatinine clearance, correct dosing is 15mg da97mbut may need to modify  based off repeat BMET.  5. HTN - BP is elevated at 154/84 during today's visit. Continue Coreg and Spironolactone at current dosing with plans to add [facility] as outlined above.   6. HLD - no recent FLP on file. LDL at 74 in 2019. He is unsure if he is still taking Crestor. Will restart at prior dosing of 43m daily. Would plan for repeat FLP and LFT's in 6-8 weeks.   7. Stage 3 CKD -  Creatinine 1.32 in 12/2018. Repeat BMET.   Medication Adjustments/Labs and Tests Ordered: Current medicines are reviewed at length with the patient today.  Concerns regarding medicines are outlined above.  Medication changes, Labs and Tests ordered today are listed in  the Patient Instructions below. Patient Instructions  Medication Instructions:   Please re-start Xarelto 15 mg daily at dinner  *If you need a refill on your cardiac medications before your next appointment, please call your pharmacy*  Lab Work: CBC,BMET, Digoxin level, HgbA1C please have lab work drawn the day of your apt on 05/05/2019 with Dr.McLean  If you have labs (blood work) drawn today and your tests are completely normal, you will receive your results only by: .Marland KitchenMyChart Message (if you have MyChart) OR . A paper copy in the mail If you have any lab test that is abnormal or we need to change your treatment, we will call you to review the results.  Testing/Procedures: None today  Follow-Up: [facility] you and your health needs are our priority.  As part of our continuing mission to provide you with exceptional heart care, we have created designated Provider Care Teams.  These Care Teams include your primary Cardiologist (physician) and Advanced Practice Providers (APPs -  Physician Assistants and Nurse Practitioners) who all work together to provide you with the care you need, when you need it.  Your next appointment:   2-3  month(s)  The format for your next appointment:   In Person  Provider:   You may see SKate Sable MD        Other Instructions NONE     Signed, BErma Heritage PA-C  05/02/2019 11:38 AM    CDuck HillS. M[address] Armstrong RoadRJefferson Maguayo [address]Phone: [phone]: [phone]

## 2019-05-02 ENCOUNTER — Encounter: Payer: Self-pay | Admitting: Student

## 2019-05-02 MED ORDER — SPIRONOLACTONE 25 MG PO TABS: 12.5000 mg | ORAL_TABLET | Freq: Every day | ORAL | 2 refills | Status: DC

## 2019-05-02 MED ORDER — ROSUVASTATIN CALCIUM 20 MG PO TABS: 20.0000 mg | ORAL_TABLET | Freq: Every day | ORAL | 2 refills | Status: DC

## 2019-05-02 MED ORDER — FUROSEMIDE 40 MG PO TABS: ORAL_TABLET | ORAL | 2 refills | Status: DC

## 2019-05-02 MED ORDER — DIGOXIN 125 MCG PO TABS: 0.1250 mg | ORAL_TABLET | Freq: Every day | ORAL | 2 refills | Status: DC

## 2019-05-02 MED ORDER — CARVEDILOL 6.25 MG PO TABS: 6.2500 mg | ORAL_TABLET | Freq: Two times a day (BID) | ORAL | 2 refills | Status: DC

## 2019-05-02 MED ORDER — CLOPIDOGREL BISULFATE 75 MG PO TABS: 75.0000 mg | ORAL_TABLET | Freq: Every day | ORAL | 2 refills | Status: DC

## 2019-05-02 NOTE — Progress Notes (Signed)
This patient needs followup and help with his meds.

## 2019-05-05 ENCOUNTER — Telehealth (HOSPITAL_COMMUNITY): Payer: Self-pay | Admitting: Pharmacist

## 2019-05-05 ENCOUNTER — Ambulatory Visit (HOSPITAL_COMMUNITY)
Admission: RE | Admit: 2019-05-05 | Discharge: 2019-05-05 | Disposition: A | Discharge status: Home / Self Care | Payer: Medicare Other | Source: Ambulatory Visit | Attending: Cardiology | Admitting: Cardiology

## 2019-05-05 ENCOUNTER — Other Ambulatory Visit: Payer: Self-pay

## 2019-05-05 VITALS — HR 65 | Wt 170.4 lb

## 2019-05-05 DIAGNOSIS — Z833 Family history of diabetes mellitus: Secondary | ICD-10-CM | POA: Insufficient documentation

## 2019-05-05 DIAGNOSIS — I350 Nonrheumatic aortic (valve) stenosis: Secondary | ICD-10-CM | POA: Insufficient documentation

## 2019-05-05 DIAGNOSIS — I130 Hypertensive heart and chronic kidney disease with heart failure and stage 1 through stage 4 chronic kidney disease, or unspecified chronic kidney disease: Secondary | ICD-10-CM | POA: Insufficient documentation

## 2019-05-05 DIAGNOSIS — Z950 Presence of cardiac pacemaker: Secondary | ICD-10-CM | POA: Insufficient documentation

## 2019-05-05 DIAGNOSIS — N183 Chronic kidney disease, stage 3 unspecified: Secondary | ICD-10-CM | POA: Insufficient documentation

## 2019-05-05 DIAGNOSIS — Z79899 Other long term (current) drug therapy: Secondary | ICD-10-CM | POA: Diagnosis not present

## 2019-05-05 DIAGNOSIS — I255 Ischemic cardiomyopathy: Secondary | ICD-10-CM | POA: Insufficient documentation

## 2019-05-05 DIAGNOSIS — I25708 Atherosclerosis of coronary artery bypass graft(s), unspecified, with other forms of angina pectoris: Secondary | ICD-10-CM

## 2019-05-05 DIAGNOSIS — I2510 Atherosclerotic heart disease of native coronary artery without angina pectoris: Secondary | ICD-10-CM | POA: Diagnosis not present

## 2019-05-05 DIAGNOSIS — I480 Paroxysmal atrial fibrillation: Secondary | ICD-10-CM | POA: Diagnosis not present

## 2019-05-05 DIAGNOSIS — I442 Atrioventricular block, complete: Secondary | ICD-10-CM | POA: Insufficient documentation

## 2019-05-05 DIAGNOSIS — I5022 Chronic systolic (congestive) heart failure: Secondary | ICD-10-CM | POA: Diagnosis present

## 2019-05-05 DIAGNOSIS — Z7902 Long term (current) use of antithrombotics/antiplatelets: Secondary | ICD-10-CM | POA: Diagnosis not present

## 2019-05-05 DIAGNOSIS — Z87891 Personal history of nicotine dependence: Secondary | ICD-10-CM | POA: Diagnosis not present

## 2019-05-05 DIAGNOSIS — Z8249 Family history of ischemic heart disease and other diseases of the circulatory system: Secondary | ICD-10-CM | POA: Diagnosis not present

## 2019-05-05 DIAGNOSIS — E785 Hyperlipidemia, unspecified: Secondary | ICD-10-CM | POA: Diagnosis not present

## 2019-05-05 DIAGNOSIS — Z951 Presence of aortocoronary bypass graft: Secondary | ICD-10-CM | POA: Insufficient documentation

## 2019-05-05 DIAGNOSIS — Z955 Presence of coronary angioplasty implant and graft: Secondary | ICD-10-CM | POA: Diagnosis not present

## 2019-05-05 LAB — DIGOXIN LEVEL: Digoxin Level: 0.8 ng/mL (ref 0.8–2.0)

## 2019-05-05 LAB — BASIC METABOLIC PANEL
Anion gap: 15 (ref 5–15)
BUN: 29 mg/dL — ABNORMAL HIGH (ref 8–23)
CO2: 20 mmol/L — ABNORMAL LOW (ref 22–32)
Calcium: 9.3 mg/dL (ref 8.9–10.3)
Chloride: 97 mmol/L — ABNORMAL LOW (ref 98–111)
Creatinine, Ser: 1.42 mg/dL — ABNORMAL HIGH (ref 0.61–1.24)
GFR calc Af Amer: 54 mL/min — ABNORMAL LOW (ref >60)
GFR calc non Af Amer: 46 mL/min — ABNORMAL LOW (ref >60)
Glucose, Bld: 446 mg/dL — ABNORMAL HIGH (ref 70–99)
Potassium: 4.8 mmol/L (ref 3.5–5.1)
Sodium: 132 mmol/L — ABNORMAL LOW (ref 135–145)

## 2019-05-05 LAB — LIPID PANEL
Cholesterol: 145 mg/dL (ref 0–200)
HDL: 28 mg/dL — ABNORMAL LOW (ref >40)
LDL Cholesterol: 40 mg/dL (ref 0–99)
Total CHOL/HDL Ratio: 5.2 RATIO
Triglycerides: 385 mg/dL — ABNORMAL HIGH (ref <150)
VLDL: 77 mg/dL — ABNORMAL HIGH (ref 0–40)

## 2019-05-05 MED ORDER — FUROSEMIDE 40 MG PO TABS: 40.0000 mg | ORAL_TABLET | Freq: Every day | ORAL | 1 refills | Status: DC

## 2019-05-05 NOTE — Telephone Encounter (Signed)
Obtained PAN grant for copay assistance with Entresto.   Member ID: [id number] Group ID: [id number] RxBin ID: [id number] PCN: [id number] Eligibility Start Date: 02/04/2019 Eligibility End Date: 05/03/2020 Assistance Amount: $1,000.00  Provided pharmacy card information to [facility].  Audry Riles, PharmD, BCPS, BCCP, CPP Heart Failure Clinic Pharmacist [phone]

## 2019-05-05 NOTE — Patient Instructions (Addendum)
Labs done today. We will contact you only if your labs are abnormal.  STOP Plavix   DECREASE Lasix to 40mg (1 tab) daily  RESTART Entresto 24-26mg (1 tablet)  Please continue all other medications as prescribed.  Your physician recommends that you schedule a follow-up appointment in: 10days([place]) for a lab appointment and in 3-4 weeks with Dr. Aundra Dubin with an Echo prior to your appointment.  Your physician has requested that you have an echocardiogram. Echocardiography is a painless test that uses sound waves to create images of your heart. It provides your doctor with information about the size and shape of your heart and how well your heart's chambers and valves are working. This procedure takes approximately one hour. There are no restrictions for this procedure.  At the [facility], you and your health needs are our priority. As part of our continuing mission to provide you with exceptional heart care, we have created designated Provider Care Teams. These Care Teams include your primary Cardiologist (physician) and Advanced Practice Providers (APPs- Physician Assistants and Nurse Practitioners) who all work together to provide you with the care you need, when you need it.   You may see any of the following providers on your designated Care Team at your next follow up: Marland Kitchen Dr Glori Bickers . Dr Loralie Champagne . Darrick Grinder, NP . Lyda Jester, PA . Audry Riles, PharmD   Please be sure to bring in all your medications bottles to every appointment.

## 2019-05-06 ENCOUNTER — Telehealth (HOSPITAL_COMMUNITY): Payer: Self-pay

## 2019-05-06 MED ORDER — ICOSAPENT ETHYL 1 G PO CAPS: 2.0000 g | ORAL_CAPSULE | Freq: Two times a day (BID) | ORAL | 5 refills | Status: DC

## 2019-05-06 NOTE — Telephone Encounter (Signed)
-----   Message from Larey Dresser, MD sent at 05/06/2019 12:29 AM EST ----- With high triglycerides would add Vascepa 2 g bid.  No other changes.

## 2019-05-06 NOTE — Progress Notes (Signed)
PCP: Dione Housekeeper, MD HF Cardiology: Dr. Aundra Dubin  79 y.o. with history of CAD, CHB, paroxysmal atrial fibrillation, and ischemic cardiomyopathy presents for followup of CHF. Patient has a long history of CAD and ischemic CMP.  He had CABG x 3 in 1996.  Echo in 7/18 showed EF 20%.  He developed complete heart block and had St Jude PPM placed.  In 2/20, he was admitted with CHF and chest pain, LHC showed occlusion of sequential SVG-D1/OM with 90% proximal LCx stenosis and 90% proximal LAD stenosis (totally occluded mid LAD with patent LIMA-LAD).  He had DES to proximal/mid LCx and DES to ostial/proximal LAD.  Echo in 2/20 showed EF 20-25% with mild-moderate AS.  He has a history of paroxysmal atrial fibrillation, so was maintained on Plavix + Xarelto after PCI.   He was readmitted for a CHF exacerbation in 8/20.  He was diuresed and had mild AKI.  He had upgrade of his St Jude device to CRT-P.    He has not been taking Entresto or Xarelto for about a month, ran out and insurance not covering well.  He does ok walking on flat ground, dyspnea walking up hills or stairs.  No orthopnea/PND.   Weight has been decreasing.  He has fatigue/weakness that seems to fluctuate.  He has stable anginal pain, generally notes walking up an incline.   Labs (8/20): K 4.1, creatinine 1.23, hgb 15.2  Labs (9/20): K 4, creatinine 1.32, digoxin 0.6  ECG (personally reviewed): AV-sequentially paced.   PMH: 1. CAD: s/p CABG x 3 in 1996.  - LHC in 2/20 showed occluded sequential SVG-D1/OM, patent LIMA-LAD, 90% proximal LCx, 90% proximal LAD, totally occluded mid LAD.  He had PCI with DES to ostial LAD to restore flow to diagonal, and PCI with DES to proximal/mid LCx.  2. Complete heart block: s/p St Jude PPM placement, later upgraded to CRT.  3. Gout 4. HTN 5. Vertigo 6. Hyperlipidemia 7. Chronic systolic CHF: Ischemic cardiomyopathy.   - Echo in 7/18 with EF 20%.  - Echo (2/20) with EF 20-25%, mild-moderate AS.  -  Echo (6/20) with EF 20%, moderate AS.  - Echo (8/20) with EF 20%, mild-moderate AS.  - Upgrade of device to [facility] CRT-P in 8/20.  8. Atrial fibrillation: Paroxysmal. 9. Aortic stenosis: Mild to moderate by 8/20 echo.  10. CKD: Stage 3.   Social History   Socioeconomic History  . Marital status: Widowed    Spouse name: Not on file  . Number of children: Not on file  . Years of education: Not on file  . Highest education level: Not on file  Occupational History  . Not on file  Tobacco Use  . Smoking status: Former Smoker    Packs/day: 2.00    Years: 40.00    Pack years: 80.00    Types: Cigarettes    Quit date: 04/02/1994    Years since quitting: 25.1  . Smokeless tobacco: Never Used  Substance and Sexual Activity  . Alcohol use: Not Currently    Alcohol/week: 0.0 standard drinks  . Drug use: No  . Sexual activity: Not Currently  Other Topics Concern  . Not on file  Social History Narrative  . Not on file   Social Determinants of Health   Financial Resource Strain:   . Difficulty of Paying Living Expenses: Not on file  Food Insecurity:   . Worried About Charity fundraiser in the Last Year: Not on file  . Ran  Out of Food in the Last Year: Not on file  Transportation Needs:   . Lack of Transportation (Medical): Not on file  . Lack of Transportation (Non-Medical): Not on file  Physical Activity:   . Days of Exercise per Week: Not on file  . Minutes of Exercise per Session: Not on file  Stress:   . Feeling of Stress : Not on file  Social Connections:   . Frequency of Communication with Friends and Family: Not on file  . Frequency of Social Gatherings with Friends and Family: Not on file  . Attends Religious Services: Not on file  . Active Member of Clubs or Organizations: Not on file  . Attends Archivist Meetings: Not on file  . Marital Status: Not on file  Intimate Partner Violence:   . Fear of Current or Ex-Partner: Not on file  . Emotionally Abused:  Not on file  . Physically Abused: Not on file  . Sexually Abused: Not on file   Family History  Problem Relation Age of Onset  . CAD Father   . Hypertension Father   . CAD Mother   . Diabetes Mother   . Breast cancer Other        Niece  . Colon cancer Neg Hx   . Pancreatic cancer Neg Hx   . Stomach cancer Neg Hx    ROS: All systems reviewed and negative except as per HPI.   Current Outpatient Medications  Medication Sig Dispense Refill  . acetaminophen (TYLENOL) 500 MG tablet Take 1,000 mg by mouth 2 (two) times daily as needed (for leg pain/pain).     . carvedilol (COREG) 6.25 MG tablet Take 1 tablet (6.25 mg total) by mouth 2 (two) times daily with a meal. 180 tablet 2  . digoxin (LANOXIN) 0.125 MG tablet Take 1 tablet (0.125 mg total) by mouth daily. 90 tablet 2  . Doxylamine Succinate, Sleep, (SLEEP AID PO) Take 1 tablet by mouth at bedtime as needed (for sleep).    . furosemide (LASIX) 40 MG tablet Take 1 tablet (40 mg total) by mouth daily. 90 tablet 1  . HYDROcodone-acetaminophen (NORCO/VICODIN) 5-325 MG tablet Take 1-2 tablets by mouth every 4 (four) hours as needed for moderate pain. 14 tablet 0  . rosuvastatin (CRESTOR) 20 MG tablet Take 1 tablet (20 mg total) by mouth daily at 6 PM. 90 tablet 2  . spironolactone (ALDACTONE) 25 MG tablet Take 0.5 tablets (12.5 mg total) by mouth daily. 45 tablet 2  . icosapent Ethyl (VASCEPA) 1 g capsule Take 2 capsules (2 g total) by mouth 2 (two) times daily. 120 capsule 5   No current facility-administered medications for this encounter.   Pulse 65   Wt 77.3 kg (170 lb 6.4 oz)   SpO2 96%   BMI 25.91 kg/m  General: NAD Neck: No JVD, no thyromegaly or thyroid nodule.  Lungs: Clear to auscultation bilaterally with normal respiratory effort. CV: Nondisplaced PMI.  Heart regular S1/S2, no S3/S4, no murmur.  No peripheral edema.  No carotid bruit.  Normal pedal pulses.  Abdomen: Soft, nontender, no hepatosplenomegaly, no distention.   Skin: Intact without lesions or rashes.  Neurologic: Alert and oriented x 3.  Psych: Normal affect. Extremities: No clubbing or cyanosis.  HEENT: Normal.   Assessment/Plan: 1. Chronic systolic CHF: Ischemic cardiomyopathy.  Echo in 8/20 with EF 20%, mild-moderate AS.  Now s/p St Jude CRT-P upgrade (8/20).  He is not volume overloaded on exam, NYHA class II symptoms.  -  I will have him restart Entresto 24/26 bid, will have our HF pharmacist help make sure he can get this affordably.  BMET today and in 10 days.  - I think we can decrease Lasix to 40 mg daily.   - Continue digoxin, check level today.  - Continue spironolactone 12.5 mg daily.  - Continue Coreg 6.25 mg bid.  - Arrange for echo.  2. CHB: Upgraded to CRT-P [facility] Jude).  3. Aortic stenosis: Suspect no more than moderate based 8/20 echo (read as mild-moderate).   4. CAD: s/p CABG 1996.  In 2/20, found to have occlusion of sequential SVG-D and OM.  Therefore, had PCI with DES to ostial LAD and proximal LCx.  He has chronic stable angina when walking up an incline.  - Continue Plavix x 1 year (stop at the end of this month).  - Continue statin.  - No ASA given Xarelto use.  5. Atrial fibrillation: Paroxysmal.  He is in NSR.   - Restart Xarelto 15 mg daily, will have HF pharmacist help him with obtaining this medication.  6. CKD stage 3: BMET today.   Echo + followup in 1 month.   Loralie Champagne 05/06/2019

## 2019-05-06 NOTE — Telephone Encounter (Signed)
Spoke with patient, aware of results and recommendations to start Vascepa. Verbalized understanding.

## 2019-05-26 ENCOUNTER — Ambulatory Visit (INDEPENDENT_AMBULATORY_CARE_PROVIDER_SITE_OTHER): Payer: Medicare Other | Admitting: *Deleted

## 2019-05-26 DIAGNOSIS — I442 Atrioventricular block, complete: Secondary | ICD-10-CM | POA: Diagnosis not present

## 2019-05-26 LAB — CUP PACEART REMOTE DEVICE CHECK
Battery Remaining Longevity: 104 mo
Battery Remaining Percentage: 95.5 %
Battery Voltage: 3.01 V
Brady Statistic AP VP Percent: 36 %
Brady Statistic AP VS Percent: 1 %
Brady Statistic AS VP Percent: 56 %
Brady Statistic AS VS Percent: 1 %
Brady Statistic RA Percent Paced: 27 %
Date Time Interrogation Session: 20210223020010
Implantable Lead Implant Date: 20140717
Implantable Lead Implant Date: 20140717
Implantable Lead Implant Date: 20200825
Implantable Lead Location: 753858
Implantable Lead Location: 753859
Implantable Lead Location: 753860
Implantable Lead Model: 1948
Implantable Pulse Generator Implant Date: 20200825
Lead Channel Impedance Value: 1075 Ohm
Lead Channel Impedance Value: 430 Ohm
Lead Channel Impedance Value: 660 Ohm
Lead Channel Pacing Threshold Amplitude: 0.5 V
Lead Channel Pacing Threshold Amplitude: 1 V
Lead Channel Pacing Threshold Amplitude: 1 V
Lead Channel Pacing Threshold Pulse Width: 0.4 ms
Lead Channel Pacing Threshold Pulse Width: 0.4 ms
Lead Channel Pacing Threshold Pulse Width: 0.5 ms
Lead Channel Sensing Intrinsic Amplitude: 12 mV
Lead Channel Sensing Intrinsic Amplitude: 3.6 mV
Lead Channel Setting Pacing Amplitude: 2 V
Lead Channel Setting Pacing Amplitude: 2.25 V
Lead Channel Setting Pacing Amplitude: 2.5 V
Lead Channel Setting Pacing Pulse Width: 0.4 ms
Lead Channel Setting Pacing Pulse Width: 0.5 ms
Lead Channel Setting Sensing Sensitivity: 4 mV
Pulse Gen Model: 3562
Pulse Gen Serial Number: 9158588

## 2019-05-27 NOTE — Progress Notes (Signed)
PPM Remote  

## 2019-05-29 LAB — BASIC METABOLIC PANEL
BUN/Creatinine Ratio: 18 (calc) (ref 6–22)
BUN: 25 mg/dL (ref 7–25)
CO2: 27 mmol/L (ref 20–32)
Calcium: 8.6 mg/dL (ref 8.6–10.3)
Chloride: 95 mmol/L — ABNORMAL LOW (ref 98–110)
Creat: 1.36 mg/dL — ABNORMAL HIGH (ref 0.70–1.11)
Glucose, Bld: 544 mg/dL (ref 65–99)
Potassium: 4.5 mmol/L (ref 3.5–5.3)
Sodium: 131 mmol/L — ABNORMAL LOW (ref 135–146)

## 2019-05-29 LAB — CBC
HCT: 46.5 % (ref 38.5–50.0)
Hemoglobin: 15.7 g/dL (ref 13.2–17.1)
MCH: 32.7 pg (ref 27.0–33.0)
MCHC: 33.8 g/dL (ref 32.0–36.0)
MCV: 96.9 fL (ref 80.0–100.0)
MPV: 11.2 fL (ref 7.5–12.5)
Platelets: 188 10*3/uL (ref 140–400)
RBC: 4.8 10*6/uL (ref 4.20–5.80)
RDW: 12.6 % (ref 11.0–15.0)
WBC: 5.6 10*3/uL (ref 3.8–10.8)

## 2019-05-29 LAB — HEMOGLOBIN A1C: Hgb A1c MFr Bld: >14 % of total Hgb — ABNORMAL HIGH (ref <5.7)

## 2019-05-29 LAB — DIGOXIN LEVEL: Digoxin Level: 1.2 mcg/L (ref 0.8–2.0)

## 2019-06-01 ENCOUNTER — Telehealth: Payer: Self-pay

## 2019-06-01 MED ORDER — DIGOXIN 125 MCG PO TABS: 0.0625 mg | ORAL_TABLET | Freq: Every day | ORAL | 2 refills | Status: DC

## 2019-06-01 NOTE — Telephone Encounter (Signed)
Spoke with wife, gave her lab results and number to [facility] to find a new pcp.They are going to go to Urgent Care in [facility] for patients elevated glucose.He will also decrease digoxin dose to 0.0625 mg daily

## 2019-06-04 ENCOUNTER — Other Ambulatory Visit: Payer: Self-pay

## 2019-06-05 ENCOUNTER — Ambulatory Visit (INDEPENDENT_AMBULATORY_CARE_PROVIDER_SITE_OTHER): Payer: Medicare Other | Admitting: Family Medicine

## 2019-06-05 ENCOUNTER — Encounter: Payer: Self-pay | Admitting: Family Medicine

## 2019-06-05 VITALS — BP 86/57 | HR 69 | Temp 98.4°F | Ht 68.0 in | Wt 168.4 lb

## 2019-06-05 DIAGNOSIS — I442 Atrioventricular block, complete: Secondary | ICD-10-CM

## 2019-06-05 DIAGNOSIS — E1165 Type 2 diabetes mellitus with hyperglycemia: Secondary | ICD-10-CM | POA: Diagnosis not present

## 2019-06-05 DIAGNOSIS — Z1211 Encounter for screening for malignant neoplasm of colon: Secondary | ICD-10-CM

## 2019-06-05 DIAGNOSIS — I10 Essential (primary) hypertension: Secondary | ICD-10-CM | POA: Diagnosis not present

## 2019-06-05 DIAGNOSIS — I25119 Atherosclerotic heart disease of native coronary artery with unspecified angina pectoris: Secondary | ICD-10-CM

## 2019-06-05 DIAGNOSIS — I4892 Unspecified atrial flutter: Secondary | ICD-10-CM

## 2019-06-05 DIAGNOSIS — I5022 Chronic systolic (congestive) heart failure: Secondary | ICD-10-CM

## 2019-06-05 DIAGNOSIS — I4891 Unspecified atrial fibrillation: Secondary | ICD-10-CM | POA: Diagnosis not present

## 2019-06-05 DIAGNOSIS — Z794 Long term (current) use of insulin: Secondary | ICD-10-CM

## 2019-06-05 DIAGNOSIS — K219 Gastro-esophageal reflux disease without esophagitis: Secondary | ICD-10-CM

## 2019-06-05 MED ORDER — INSULIN LISPRO 100 UNIT/ML ~~LOC~~ SOLN: 2.0000 [IU] | Freq: Three times a day (TID) | SUBCUTANEOUS | 2 refills | Status: DC

## 2019-06-05 NOTE — Progress Notes (Addendum)
New Patient Office Visit  Assessment & Plan:  1. Uncontrolled type 2 diabetes mellitus with hyperglycemia, with long-term current use of insulin (HCC) Lab Results  Component Value Date   HGBA1C >14.0 (H) 05/28/2019   HGBA1C 6.2 (H) 05/21/2018   HGBA1C 5.4 10/05/2012  - Diabetes is not at goal of A1c < 7. - Medications: Lantus to be increased by 2 units once weekly until he has a fasting blood sugar less than 150.  Also started patient on Humalog sliding scale ACHS. - Home glucose monitoring: ACHS - Patient is currently taking a statin. Patient is taking an ACE-inhibitor/ARB.  - Urine Microalbumin/Creat Ratio: Ordered today - Instruction/counseling given: reminded to bring blood glucose meter & log to each visit, discussed diet and provided printed educational material - Microalbumin / creatinine urine ratio - insulin lispro (HUMALOG) 100 UNIT/ML injection; Inject 0.02-0.1 mLs (2-10 Units total) into the skin 4 (four) times daily -  before meals and at bedtime.  Dispense: 10 mL; Refill: 2  2. Essential hypertension - Advised patient to keep a log of his blood pressure readings.  Discussed that if he is staying low or if he starts having symptoms of dizziness or feeling like he is going to pass out he needs to contact his cardiologist ASAP.  3-7. Atrial fibrillation and flutter (HCC)/Chronic systolic CHF (congestive heart failure) (HCC)/Complete heart block (HCC) s/p Pacemaker in 2014/Coronary artery disease involving native coronary artery of native heart with angina pectoris ([place]) - Managed by cardiologists.  7. Colon cancer screening - Ambulatory referral to Gastroenterology  8.  GERD - Advised patient to start taking famotidine 20 mg twice daily.  Education also provided on GERD food choices.   Follow-up: Return in about 3 months (around 09/05/2019) for DM.   Hendricks Limes, MSN, APRN, FNP-C [facility]  Subjective:  Patient ID: Ricardo Hunt, male     DOB: [birth year]  Age: 79 y.o. MRN: [id number]  Patient Care Team: Ricardo Brooklyn, FNP as PCP - General (Family Medicine) Ricardo Grayer, MD as Consulting Physician (Cardiology) Ricardo Commons, MD as Consulting Physician (Cardiology) Ricardo Dresser, MD as Consulting Physician (Cardiology)  CC:  Chief Complaint  Patient presents with  . New Patient (Initial Visit)    Ricardo Hunt   . Establish Care    HPI Ricardo Hunt presents to establish care. He is transferring care from Ricardo Hunt office as he has retired and the office has closed.   Patient is accompanied by his daughter, Ricardo Hunt, who he is okay with being present.  Patient advised by cardiologist on 06/01/2019 that he needed to be seen at urgent care or ER due to a glucose of 544.  He was seen at [facility] urgent care where he was started on Lantus 15 units once daily.  He has been checking his blood sugar 4 times a day since then.  His readings are remaining significantly over 200.  Patient does report eating a little debbies every day.  Patient's blood pressure is low today.  He reports this is not his usual.  He denies any dizziness or feeling faint.   Review of Systems  Constitutional: Negative for chills, fever, malaise/fatigue and weight loss.  HENT: Negative for congestion, ear discharge, ear pain, nosebleeds, sinus pain, sore throat and tinnitus.   Eyes: Negative for blurred vision, double vision, pain, discharge and redness.  Respiratory: Negative for cough, shortness of breath and wheezing.   Cardiovascular: Negative for chest pain, palpitations and  leg swelling.  Gastrointestinal: Positive for heartburn. Negative for abdominal pain, constipation, diarrhea, nausea and vomiting.  Genitourinary: Negative for dysuria, frequency and urgency.  Musculoskeletal: Negative for myalgias.  Skin: Negative for rash.  Neurological: Negative for dizziness, seizures, weakness and headaches.  Endo/Heme/Allergies: Positive for polydipsia.    Psychiatric/Behavioral: Negative for depression, substance abuse and suicidal ideas. The patient is not nervous/anxious.     Current Outpatient Medications:  .  ACCU-CHEK GUIDE test strip, CHECK BLOOD SUGAR FOURETIMES A DAY BEFORE MEALS AT BEDTIME, Disp: , Rfl:  .  acetaminophen (TYLENOL) 500 MG tablet, Take 1,000 mg by mouth 2 (two) times daily as needed (for leg pain/pain). , Disp: , Rfl:  .  carvedilol (COREG) 6.25 MG tablet, Take 1 tablet (6.25 mg total) by mouth 2 (two) times daily with a meal., Disp: 180 tablet, Rfl: 2 .  digoxin (LANOXIN) 0.125 MG tablet, Take 0.5 tablets (0.0625 mg total) by mouth daily., Disp: 45 tablet, Rfl: 2 .  Doxylamine Succinate, Sleep, (SLEEP AID PO), Take 1 tablet by mouth at bedtime as needed (for sleep)., Disp: , Rfl:  .  ENTRESTO 24-26 MG, Take 1 tablet by mouth daily. , Disp: , Rfl:  .  furosemide (LASIX) 40 MG tablet, Take 1 tablet (40 mg total) by mouth daily., Disp: 90 tablet, Rfl: 1 .  glucose blood (FREESTYLE LITE) test strip, Test daily before all meals/snacks and once before bedtime., Disp: , Rfl:  .  insulin glargine (LANTUS) 100 UNIT/ML injection, Inject 17 Units into the skin at bedtime., Disp: , Rfl:  .  Insulin Pen Needle (LITETOUCH PEN NEEDLES) 31G X 6 MM MISC, by Does not apply route., Disp: , Rfl:  .  Lancets (FREESTYLE) lancets, Test daily before all meals/snacks and once before bedtime., Disp: , Rfl:  .  Rivaroxaban (XARELTO) 15 MG TABS tablet, Take 15 mg by mouth daily after breakfast., Disp: , Rfl:  .  rosuvastatin (CRESTOR) 20 MG tablet, Take 1 tablet (20 mg total) by mouth daily at 6 PM., Disp: 90 tablet, Rfl: 2 .  spironolactone (ALDACTONE) 25 MG tablet, Take 0.5 tablets (12.5 mg total) by mouth daily., Disp: 45 tablet, Rfl: 2 .  insulin lispro (HUMALOG) 100 UNIT/ML injection, Inject 0.02-0.1 mLs (2-10 Units total) into the skin 4 (four) times daily -  before meals and at bedtime., Disp: 10 mL, Rfl: 2  No Known Allergies  Past  Medical History:  Diagnosis Date  . Arthritis    " all over the body"  . Atrial fibrillation and flutter ([place])    seen on PPM interrogation; Rivaroxaban started 04/2018  . Chronic systolic CHF (congestive heart failure) ([place])    Echo 05/2018: EF 20-25, severe LVE, diffuse HK with septal and apical akinesis, moderate LAE, moderate MAC, severe aortic valve calcification - degree of AS diff to judge with low EF (likely mild to mod)  . Coronary artery disease    s/p CABG 1996 // LHC 05/2018: oLAD 90, pLAD 100 after Dx, LCx 90 at bifurcation of OM1/OM2, mRCA 50; L-LAD ok; S-D1/OM2 100 >> PCI: DES to mLCx and orbital atherectomy and DES to oLAD  . Diabetes mellitus without complication ([place])   . GERD (gastroesophageal reflux disease)   . Hyperlipidemia   . Hypertension   . Mobitz (type) II atrioventricular block    s/p STJ Accent pacemaker implanted by Dr [name] 10-16-2012  . Myocardial infarction ([place])    per pt., told that that the stress test shows that there is a  weakening evident on the stress test  . Pacemaker 10/16/2012   St. Jude   . Personal history of colonic polyps-tubular adenomas 08/31/2013  . Thrombocytopenia ([place]) 10/03/2012  . Vertigo     Past Surgical History:  Procedure Laterality Date  . BIV UPGRADE N/A 11/25/2018   Procedure: BIV UPGRADE;  Surgeon: Ricardo Grayer, MD;  Location: [place] [facility];  Service: Cardiovascular;  Laterality: N/A;  . cardiac bypass  1996   3 vessels  . CATARACT EXTRACTION W/ INTRAOCULAR LENS  IMPLANT, BILATERAL Bilateral 2010  . COLONOSCOPY    . CORONARY ARTERY BYPASS GRAFT    . CORONARY ATHERECTOMY N/A 05/23/2018   Procedure: CORONARY ATHERECTOMY;  Surgeon: [name], Peter M, MD;  Location: [place] [facility];  Service: Cardiovascular;  Laterality: N/A;  . CORONARY STENT INTERVENTION N/A 05/23/2018   Procedure: CORONARY STENT INTERVENTION;  Surgeon: [name], Peter M, MD;  Location: [place] [facility];  Service: Cardiovascular;  Laterality: N/A;  .  INGUINAL HERNIA REPAIR Left 2010  . LUMBAR LAMINECTOMY/DECOMPRESSION MICRODISCECTOMY Right 07/19/2016   Procedure: Right Lumbar Four-Five Hemilaminectomy and Bilateral Lumbar Two-Three Laminectomy;  Surgeon: Eustace Moore, MD;  Location: [facility];  Service: Neurosurgery;  Laterality: Right;  Right Lumbar Four-Five Hemilaminectomy and Bilateral Lumbar Two-Three Laminectomy  . LUMBAR SPINE SURGERY  2006  . NASAL SEPTUM SURGERY  09/01/13   Dr. Adriana Reams  . PACEMAKER INSERTION  10-16-2012   STJ Accent dual chamber pacemaker implanted by Dr [name] for symptomatic Mobitz II heart block  . PERMANENT PACEMAKER INSERTION N/A 10/16/2012   Procedure: PERMANENT PACEMAKER INSERTION;  Surgeon: Ricardo Grayer, MD;  Location: [facility];  Service: Cardiovascular;  Laterality: N/A;  . RIGHT/LEFT HEART CATH AND CORONARY/GRAFT ANGIOGRAPHY N/A 05/20/2018   Procedure: RIGHT/LEFT HEART CATH AND CORONARY/GRAFT ANGIOGRAPHY;  Surgeon: [name], Peter M, MD;  Location: [place] [facility];  Service: Cardiovascular;  Laterality: N/A;    Family History  Problem Relation Age of Onset  . CAD Father   . Hypertension Father   . CAD Mother   . Diabetes Mother   . Breast cancer Other        Niece  . Hypertension Daughter   . Hypertension Son   . Breast cancer Sister   . Colon cancer Neg Hx   . Pancreatic cancer Neg Hx   . Stomach cancer Neg Hx     Social History   Socioeconomic History  . Marital status: Widowed    Spouse name: Not on file  . Number of children: Not on file  . Years of education: Not on file  . Highest education level: Not on file  Occupational History  . Not on file  Tobacco Use  . Smoking status: Former Smoker    Packs/day: 2.00    Years: 40.00    Pack years: 80.00    Types: Cigarettes    Quit date: 04/02/1994    Years since quitting: 25.1  . Smokeless tobacco: Never Used  Substance and Sexual Activity  . Alcohol use: Not Currently    Alcohol/week: 0.0 standard drinks  . Drug use: No  .  Sexual activity: Not Currently  Other Topics Concern  . Not on file  Social History Narrative  . Not on file   Social Determinants of Health   Financial Resource Strain:   . Difficulty of Paying Living Expenses: Not on file  Food Insecurity:   . Worried About Charity fundraiser in the Last Year: Not on file  .  Ran Out of Food in the Last Year: Not on file  Transportation Needs:   . Lack of Transportation (Medical): Not on file  . Lack of Transportation (Non-Medical): Not on file  Physical Activity:   . Days of Exercise per Week: Not on file  . Minutes of Exercise per Session: Not on file  Stress:   . Feeling of Stress : Not on file  Social Connections:   . Frequency of Communication with Friends and Family: Not on file  . Frequency of Social Gatherings with Friends and Family: Not on file  . Attends Religious Services: Not on file  . Active Member of Clubs or Organizations: Not on file  . Attends Archivist Meetings: Not on file  . Marital Status: Not on file  Intimate Partner Violence:   . Fear of Current or Ex-Partner: Not on file  . Emotionally Abused: Not on file  . Physically Abused: Not on file  . Sexually Abused: Not on file    Objective:   Today's Vitals: BP (!) 86/57   Pulse 69   Temp 98.4 F (36.9 C) (Temporal)   Ht _0  (1.727 m)   Wt 168 lb 6.4 oz (76.4 kg)   SpO2 96%   BMI 25.61 kg/m   Physical Exam Vitals reviewed.  Constitutional:      General: He is not in acute distress.    Appearance: Normal appearance. He is normal weight. He is not ill-appearing, toxic-appearing or diaphoretic.  HENT:     Head: Normocephalic and atraumatic.  Eyes:     General: No scleral icterus.       Right eye: No discharge.        Left eye: No discharge.     Conjunctiva/sclera: Conjunctivae normal.  Cardiovascular:     Rate and Rhythm: Normal rate and regular rhythm.     Heart sounds: Normal heart sounds. No murmur. No friction rub. No gallop.    Pulmonary:     Effort: Pulmonary effort is normal. No respiratory distress.     Breath sounds: Normal breath sounds. No stridor. No wheezing, rhonchi or rales.  Musculoskeletal:        General: Normal range of motion.     Cervical back: Normal range of motion.  Skin:    General: Skin is warm and dry.  Neurological:     Mental Status: He is alert and oriented to person, place, and time. Mental status is at baseline.  Psychiatric:        Mood and Affect: Mood normal.        Behavior: Behavior normal.        Thought Content: Thought content normal.        Judgment: Judgment normal.

## 2019-06-05 NOTE — Patient Instructions (Addendum)
Famotidine 20 mg twice daily.  Lantus (this is the long acting) - increase by 2 units every week if fasting blood sugar (first thing in the morning with nothing to eat or drink) is consistently > 150.   Humalog (this is the short acting) - use per the sliding scale below. You should be checking your blood sugar before each meal and at bedtime. NO NOT USE THIS MEDICATION IF YOU ARE NOT GOING TO EAT.   Normal Insulin Sensitivity: < 150: 0 units 150-199: 2 units 200-249: 4 units 250-299: 6 units 300-349: 8 units >/= 350: 10  units  Bedtime: 250-299: 0-2 units 300-349: 2-4 units >/= 350: 4-6 units   Food Choices for Gastroesophageal Reflux Disease, Adult When you have gastroesophageal reflux disease (GERD), the foods you eat and your eating habits are very important. Choosing the right foods can help ease your discomfort. Think about working with a nutrition specialist (dietitian) to help you make good choices. What are tips for following this plan?  Meals  Choose healthy foods that are low in fat, such as fruits, vegetables, whole grains, low-fat dairy products, and lean meat, fish, and poultry.  Eat small meals often instead of 3 large meals a day. Eat your meals slowly, and in a place where you are relaxed. Avoid bending over or lying down until 2-3 hours after eating.  Avoid eating meals 2-3 hours before bed.  Avoid drinking a lot of liquid with meals.  Cook foods using methods other than frying. Bake, grill, or broil food instead.  Avoid or limit: ? Chocolate. ? Peppermint or spearmint. ? Alcohol. ? Pepper. ? Black and decaffeinated coffee. ? Black and decaffeinated tea. ? Bubbly (carbonated) soft drinks. ? Caffeinated energy drinks and soft drinks.  Limit high-fat foods such as: ? Fatty meat or fried foods. ? Whole milk, cream, butter, or ice cream. ? Nuts and nut butters. ? Pastries, donuts, and sweets made with butter or shortening.  Avoid foods that cause  symptoms. These foods may be different for everyone. Common foods that cause symptoms include: ? Tomatoes. ? Oranges, lemons, and limes. ? Peppers. ? Spicy food. ? Onions and garlic. ? Vinegar. Lifestyle  Maintain a healthy weight. Ask your doctor what weight is healthy for you. If you need to lose weight, work with your doctor to do so safely.  Exercise for at least 30 minutes for 5 or more days each week, or as told by your doctor.  Wear loose-fitting clothes.  Do not smoke. If you need help quitting, ask your doctor.  Sleep with the head of your bed higher than your feet. Use a wedge under the mattress or blocks under the bed frame to raise the head of the bed. Summary  When you have gastroesophageal reflux disease (GERD), food and lifestyle choices are very important in easing your symptoms.  Eat small meals often instead of 3 large meals a day. Eat your meals slowly, and in a place where you are relaxed.  Limit high-fat foods such as fatty meat or fried foods.  Avoid bending over or lying down until 2-3 hours after eating.  Avoid peppermint and spearmint, caffeine, alcohol, and chocolate. This information is not intended to replace advice given to you by your health care provider. Make sure you discuss any questions you have with your health care provider. Document Revised: 07/10/2018 Document Reviewed: 04/24/2016 Elsevier Patient Education  [place].   Diabetes Mellitus and Nutrition, Adult When you have diabetes (diabetes  mellitus), it is very important to have healthy eating habits because your blood sugar (glucose) levels are greatly affected by what you eat and drink. Eating healthy foods in the appropriate amounts, at about the same times every day, can help you:  Control your blood glucose.  Lower your risk of heart disease.  Improve your blood pressure.  Reach or maintain a healthy weight. Every person with diabetes is different, and each person has  different needs for a meal plan. Your health care provider may recommend that you work with a diet and nutrition specialist (dietitian) to make a meal plan that is best for you. Your meal plan may vary depending on factors such as:  The calories you need.  The medicines you take.  Your weight.  Your blood glucose, blood pressure, and cholesterol levels.  Your activity level.  Other health conditions you have, such as heart or kidney disease. How do carbohydrates affect me? Carbohydrates, also called carbs, affect your blood glucose level more than any other type of food. Eating carbs naturally raises the amount of glucose in your blood. Carb counting is a method for keeping track of how many carbs you eat. Counting carbs is important to keep your blood glucose at a healthy level, especially if you use insulin or take certain oral diabetes medicines. It is important to know how many carbs you can safely have in each meal. This is different for every person. Your dietitian can help you calculate how many carbs you should have at each meal and for each snack. Foods that contain carbs include:  Bread, cereal, rice, pasta, and crackers.  Potatoes and corn.  Peas, beans, and lentils.  Milk and yogurt.  Fruit and juice.  Desserts, such as cakes, cookies, ice cream, and candy. How does alcohol affect me? Alcohol can cause a sudden decrease in blood glucose (hypoglycemia), especially if you use insulin or take certain oral diabetes medicines. Hypoglycemia can be a life-threatening condition. Symptoms of hypoglycemia (sleepiness, dizziness, and confusion) are similar to symptoms of having too much alcohol. If your health care provider says that alcohol is safe for you, follow these guidelines:  Limit alcohol intake to no more than 1 drink per day for nonpregnant women and 2 drinks per day for men. One drink equals 12 oz of beer, 5 oz of wine, or 1 oz of hard liquor.  Do not drink on an  empty stomach.  Keep yourself hydrated with water, diet soda, or unsweetened iced tea.  Keep in mind that regular soda, juice, and other mixers may contain a lot of sugar and must be counted as carbs. What are tips for following this plan?  Reading food labels  Start by checking the serving size on the "Nutrition Facts" label of packaged foods and drinks. The amount of calories, carbs, fats, and other nutrients listed on the label is based on one serving of the item. Many items contain more than one serving per package.  Check the total grams (g) of carbs in one serving. You can calculate the number of servings of carbs in one serving by dividing the total carbs by 15. For example, if a food has 30 g of total carbs, it would be equal to 2 servings of carbs.  Check the number of grams (g) of saturated and trans fats in one serving. Choose foods that have low or no amount of these fats.  Check the number of milligrams (mg) of salt (sodium) in one serving. Most  people should limit total sodium intake to less than 2,300 mg per day.  Always check the nutrition information of foods labeled as "low-fat" or "nonfat". These foods may be higher in added sugar or refined carbs and should be avoided.  Talk to your dietitian to identify your daily goals for nutrients listed on the label. Shopping  Avoid buying canned, premade, or processed foods. These foods tend to be high in fat, sodium, and added sugar.  Shop around the outside edge of the grocery store. This includes fresh fruits and vegetables, bulk grains, fresh meats, and fresh dairy. Cooking  Use low-heat cooking methods, such as baking, instead of high-heat cooking methods like deep frying.  Cook using healthy oils, such as olive, canola, or sunflower oil.  Avoid cooking with butter, cream, or high-fat meats. Meal planning  Eat meals and snacks regularly, preferably at the same times every day. Avoid going long periods of time without  eating.  Eat foods high in fiber, such as fresh fruits, vegetables, beans, and whole grains. Talk to your dietitian about how many servings of carbs you can eat at each meal.  Eat 4-6 ounces (oz) of lean protein each day, such as lean meat, chicken, fish, eggs, or tofu. One oz of lean protein is equal to: ? 1 oz of meat, chicken, or fish. ? 1 egg. ?  cup of tofu.  Eat some foods each day that contain healthy fats, such as avocado, nuts, seeds, and fish. Lifestyle  Check your blood glucose regularly.  Exercise regularly as told by your health care provider. This may include: ? 150 minutes of moderate-intensity or vigorous-intensity exercise each week. This could be brisk walking, biking, or water aerobics. ? Stretching and doing strength exercises, such as yoga or weightlifting, at least 2 times a week.  Take medicines as told by your health care provider.  Do not use any products that contain nicotine or tobacco, such as cigarettes and e-cigarettes. If you need help quitting, ask your health care provider.  Work with a Social worker or diabetes educator to identify strategies to manage stress and any emotional and social challenges. Questions to ask a health care provider  Do I need to meet with a diabetes educator?  Do I need to meet with a dietitian?  What number can I call if I have questions?  When are the best times to check my blood glucose? Where to find more information:  American Diabetes Association: diabetes.org  Academy of Nutrition and Dietetics: www.eatright.[employer] of Diabetes and Digestive and Kidney Diseases (NIH): [employer] Summary  A healthy meal plan will help you control your blood glucose and maintain a healthy lifestyle.  Working with a diet and nutrition specialist (dietitian) can help you make a meal plan that is best for you.  Keep in mind that carbohydrates (carbs) and alcohol have immediate effects on your blood glucose  levels. It is important to count carbs and to use alcohol carefully. This information is not intended to replace advice given to you by your health care provider. Make sure you discuss any questions you have with your health care provider. Document Revised: 03/01/2017 Document Reviewed: 04/23/2016 Elsevier Patient Education  2020 [employer].

## 2019-06-07 DIAGNOSIS — K219 Gastro-esophageal reflux disease without esophagitis: Secondary | ICD-10-CM | POA: Insufficient documentation

## 2019-06-07 DIAGNOSIS — E1165 Type 2 diabetes mellitus with hyperglycemia: Secondary | ICD-10-CM | POA: Insufficient documentation

## 2019-06-08 ENCOUNTER — Other Ambulatory Visit: Payer: Self-pay

## 2019-06-08 ENCOUNTER — Ambulatory Visit (HOSPITAL_COMMUNITY)
Admission: RE | Admit: 2019-06-08 | Discharge: 2019-06-08 | Disposition: A | Discharge status: Home / Self Care | Payer: Medicare Other | Source: Ambulatory Visit | Attending: Cardiology | Admitting: Cardiology

## 2019-06-08 ENCOUNTER — Telehealth: Payer: Self-pay

## 2019-06-08 ENCOUNTER — Ambulatory Visit (HOSPITAL_BASED_OUTPATIENT_CLINIC_OR_DEPARTMENT_OTHER)
Admission: RE | Admit: 2019-06-08 | Discharge: 2019-06-08 | Disposition: A | Discharge status: Home / Self Care | Payer: Medicare Other | Source: Ambulatory Visit | Attending: Cardiology | Admitting: Cardiology

## 2019-06-08 ENCOUNTER — Encounter (HOSPITAL_COMMUNITY): Payer: Self-pay | Admitting: Cardiology

## 2019-06-08 VITALS — BP 100/62 | HR 66 | Wt 166.2 lb

## 2019-06-08 DIAGNOSIS — I350 Nonrheumatic aortic (valve) stenosis: Secondary | ICD-10-CM | POA: Diagnosis not present

## 2019-06-08 DIAGNOSIS — Z7901 Long term (current) use of anticoagulants: Secondary | ICD-10-CM | POA: Insufficient documentation

## 2019-06-08 DIAGNOSIS — I130 Hypertensive heart and chronic kidney disease with heart failure and stage 1 through stage 4 chronic kidney disease, or unspecified chronic kidney disease: Secondary | ICD-10-CM | POA: Diagnosis not present

## 2019-06-08 DIAGNOSIS — E1122 Type 2 diabetes mellitus with diabetic chronic kidney disease: Secondary | ICD-10-CM | POA: Diagnosis not present

## 2019-06-08 DIAGNOSIS — Z951 Presence of aortocoronary bypass graft: Secondary | ICD-10-CM | POA: Diagnosis not present

## 2019-06-08 DIAGNOSIS — Z87891 Personal history of nicotine dependence: Secondary | ICD-10-CM | POA: Insufficient documentation

## 2019-06-08 DIAGNOSIS — Z79899 Other long term (current) drug therapy: Secondary | ICD-10-CM | POA: Diagnosis not present

## 2019-06-08 DIAGNOSIS — Z794 Long term (current) use of insulin: Secondary | ICD-10-CM | POA: Insufficient documentation

## 2019-06-08 DIAGNOSIS — Z833 Family history of diabetes mellitus: Secondary | ICD-10-CM | POA: Insufficient documentation

## 2019-06-08 DIAGNOSIS — I2510 Atherosclerotic heart disease of native coronary artery without angina pectoris: Secondary | ICD-10-CM | POA: Insufficient documentation

## 2019-06-08 DIAGNOSIS — N183 Chronic kidney disease, stage 3 unspecified: Secondary | ICD-10-CM | POA: Diagnosis not present

## 2019-06-08 DIAGNOSIS — Z950 Presence of cardiac pacemaker: Secondary | ICD-10-CM | POA: Diagnosis not present

## 2019-06-08 DIAGNOSIS — I480 Paroxysmal atrial fibrillation: Secondary | ICD-10-CM

## 2019-06-08 DIAGNOSIS — E785 Hyperlipidemia, unspecified: Secondary | ICD-10-CM | POA: Diagnosis not present

## 2019-06-08 DIAGNOSIS — Z955 Presence of coronary angioplasty implant and graft: Secondary | ICD-10-CM | POA: Diagnosis not present

## 2019-06-08 DIAGNOSIS — Z8249 Family history of ischemic heart disease and other diseases of the circulatory system: Secondary | ICD-10-CM | POA: Insufficient documentation

## 2019-06-08 DIAGNOSIS — I5022 Chronic systolic (congestive) heart failure: Secondary | ICD-10-CM

## 2019-06-08 DIAGNOSIS — I255 Ischemic cardiomyopathy: Secondary | ICD-10-CM | POA: Insufficient documentation

## 2019-06-08 LAB — BASIC METABOLIC PANEL
Anion gap: 11 (ref 5–15)
BUN: 20 mg/dL (ref 8–23)
CO2: 24 mmol/L (ref 22–32)
Calcium: 9.2 mg/dL (ref 8.9–10.3)
Chloride: 98 mmol/L (ref 98–111)
Creatinine, Ser: 1.28 mg/dL — ABNORMAL HIGH (ref 0.61–1.24)
GFR calc Af Amer: >60 mL/min (ref >60)
GFR calc non Af Amer: 53 mL/min — ABNORMAL LOW (ref >60)
Glucose, Bld: 245 mg/dL — ABNORMAL HIGH (ref 70–99)
Potassium: 4.3 mmol/L (ref 3.5–5.1)
Sodium: 133 mmol/L — ABNORMAL LOW (ref 135–145)

## 2019-06-08 LAB — DIGOXIN LEVEL: Digoxin Level: 0.5 ng/mL — ABNORMAL LOW (ref 0.8–2.0)

## 2019-06-08 NOTE — Telephone Encounter (Signed)
-----   Message from Loman Brooklyn, [place] sent at 06/07/2019  1:37 PM EST ----- Can we please call [facility] and see if they happen to have documentation of giving patient any shingles vaccine?  Also please look in [facility] for COVID vaccines as he reports he has completed both of them and update Epic.  Last thing, his urine micro says needs collected but I personally put him in the bathroom and gave him a cup and told him what to do.  Can we check on this as well?

## 2019-06-08 NOTE — Telephone Encounter (Signed)
Attempted to contact patient - LMTCB  Per lab no urine was left so patient will need to leave a urine.  Per [facility] Zoster was given 01/20/13 & no COVID in [facility].   Called [facility] and patient has been retired for years so they no longer have his record.  States last time shingrix was given was 3 years ago at [facility] and they think patient was retired before them.  Also they do not keep track of immunizations - patients are to let their PCP know when they get one.

## 2019-06-08 NOTE — Progress Notes (Signed)
  Echocardiogram 2D Echocardiogram has been performed.  Ricardo Hunt 06/08/2019, 3:05 PM

## 2019-06-08 NOTE — Patient Instructions (Addendum)
TAKE Entresto twice a day   Labs today We will only contact you if something comes back abnormal or we need to make some changes. Otherwise no news is good news!   Your physician recommends that you schedule a follow-up appointment in: 3 weeks with Pharmacy and 2 months with Dr Aundra Dubin   Please call office at [phone] option 2 if you have any questions or concerns.     At the [facility], you and your health needs are our priority. As part of our continuing mission to provide you with exceptional heart care, we have created designated Provider Care Teams. These Care Teams include your primary Cardiologist (physician) and Advanced Practice Providers (APPs- Physician Assistants and Nurse Practitioners) who all work together to provide you with the care you need, when you need it.     You may see any of the following providers on your designated Care Team at your next follow up: Marland Kitchen Dr Glori Bickers . Dr Loralie Champagne . Darrick Grinder, NP . Lyda Jester, PA . Audry Riles, PharmD   Please be sure to bring in all your medications bottles to every appointment.

## 2019-06-09 ENCOUNTER — Telehealth (HOSPITAL_COMMUNITY): Payer: Self-pay

## 2019-06-09 MED ORDER — RIVAROXABAN 20 MG PO TABS: 20.0000 mg | ORAL_TABLET | Freq: Every day | ORAL | 11 refills | Status: DC

## 2019-06-09 NOTE — Progress Notes (Signed)
PCP: Loman Brooklyn, FNP HF Cardiology: Dr. Aundra Dubin  79 y.o. with history of CAD, CHB, paroxysmal atrial fibrillation, and ischemic cardiomyopathy presents for followup of CHF. Patient has a long history of CAD and ischemic CMP.  He had CABG x 3 in 1996.  Echo in 7/18 showed EF 20%.  He developed complete heart block and had St Jude PPM placed.  In 2/20, he was admitted with CHF and chest pain, LHC showed occlusion of sequential SVG-D1/OM with 90% proximal LCx stenosis and 90% proximal LAD stenosis (totally occluded mid LAD with patent LIMA-LAD).  He had DES to proximal/mid LCx and DES to ostial/proximal LAD.  Echo in 2/20 showed EF 20-25% with mild-moderate AS.  He has a history of paroxysmal atrial fibrillation, so was maintained on Plavix + Xarelto after PCI.   He was readmitted for a CHF exacerbation in 8/20.  He was diuresed and had mild AKI.  He had upgrade of his St Jude device to CRT-P.    Echo was done today and reviewed.  EF 25% with diffuse hypokinesis, mildly decreased RV systolic function.    He was recently diagnosed with diabetes.  SBP has been running lower for a couple of weeks, ?if related to hyperglycemia/polyuria.  No dyspnea walking on flat ground. He is short of breath with walking up stairs.  Rare atypical chest pain.  No orthopnea/PND. He is not lightheaded.    St Jude device interrogation: impedance trending down, 92% BiV paced.  No atrial fibrillation/VT.   Labs (8/20): K 4.1, creatinine 1.23, hgb 15.2  Labs (9/20): K 4, creatinine 1.32, digoxin 0.6 Labs (2/21): K 4.5, creatinine 1.36  ECG (personally reviewed): a-sensed, v-paced  PMH: 1. CAD: s/p CABG x 3 in 1996.  - LHC in 2/20 showed occluded sequential SVG-D1/OM, patent LIMA-LAD, 90% proximal LCx, 90% proximal LAD, totally occluded mid LAD.  He had PCI with DES to ostial LAD to restore flow to diagonal, and PCI with DES to proximal/mid LCx.  2. Complete heart block: s/p St Jude PPM placement, later upgraded to  CRT.  3. Gout 4. HTN 5. Vertigo 6. Hyperlipidemia 7. Chronic systolic CHF: Ischemic cardiomyopathy.   - Echo in 7/18 with EF 20%.  - Echo (2/20) with EF 20-25%, mild-moderate AS.  - Echo (6/20) with EF 20%, moderate AS.  - Echo (8/20) with EF 20%, mild-moderate AS.  - Upgrade of device to [facility] CRT-P in 8/20.  - Echo (3/21): EF 25% with diffuse hypokinesis, mildly decreased RV systolic function. Aortic valve sclerosis without significant stenosis.  8. Atrial fibrillation: Paroxysmal. 9. Aortic stenosis: Mild to moderate by 8/20 echo.  Mean gradient only 6 mmHg on 3/21 echo.  10. CKD: Stage 3.  11. Type II diabetes  Social History   Socioeconomic History  . Marital status: Widowed    Spouse name: Not on file  . Number of children: Not on file  . Years of education: Not on file  . Highest education level: Not on file  Occupational History  . Not on file  Tobacco Use  . Smoking status: Former Smoker    Packs/day: 2.00    Years: 40.00    Pack years: 80.00    Types: Cigarettes    Quit date: 04/02/1994    Years since quitting: 25.2  . Smokeless tobacco: Never Used  Substance and Sexual Activity  . Alcohol use: Not Currently    Alcohol/week: 0.0 standard drinks  . Drug use: No  . Sexual activity: Not  Currently  Other Topics Concern  . Not on file  Social History Narrative  . Not on file   Social Determinants of Health   Financial Resource Strain:   . Difficulty of Paying Living Expenses: Not on file  Food Insecurity:   . Worried About Charity fundraiser in the Last Year: Not on file  . Ran Out of Food in the Last Year: Not on file  Transportation Needs:   . Lack of Transportation (Medical): Not on file  . Lack of Transportation (Non-Medical): Not on file  Physical Activity:   . Days of Exercise per Week: Not on file  . Minutes of Exercise per Session: Not on file  Stress:   . Feeling of Stress : Not on file  Social Connections:   . Frequency of Communication  with Friends and Family: Not on file  . Frequency of Social Gatherings with Friends and Family: Not on file  . Attends Religious Services: Not on file  . Active Member of Clubs or Organizations: Not on file  . Attends Archivist Meetings: Not on file  . Marital Status: Not on file  Intimate Partner Violence:   . Fear of Current or Ex-Partner: Not on file  . Emotionally Abused: Not on file  . Physically Abused: Not on file  . Sexually Abused: Not on file   Family History  Problem Relation Age of Onset  . CAD Father   . Hypertension Father   . CAD Mother   . Diabetes Mother   . Breast cancer Other        Niece  . Hypertension Daughter   . Hypertension Son   . Breast cancer Sister   . Colon cancer Neg Hx   . Pancreatic cancer Neg Hx   . Stomach cancer Neg Hx    ROS: All systems reviewed and negative except as per HPI.   Current Outpatient Medications  Medication Sig Dispense Refill  . ACCU-CHEK GUIDE test strip CHECK BLOOD SUGAR FOURETIMES A DAY BEFORE MEALS AT BEDTIME    . acetaminophen (TYLENOL) 500 MG tablet Take 1,000 mg by mouth 2 (two) times daily as needed (for leg pain/pain).     . carvedilol (COREG) 6.25 MG tablet Take 1 tablet (6.25 mg total) by mouth 2 (two) times daily with a meal. 180 tablet 2  . digoxin (LANOXIN) 0.125 MG tablet Take 0.5 tablets (0.0625 mg total) by mouth daily. 45 tablet 2  . Doxylamine Succinate, Sleep, (SLEEP AID PO) Take 1 tablet by mouth at bedtime as needed (for sleep).    Delene Loll 24-26 MG Take 1 tablet by mouth daily.     . furosemide (LASIX) 40 MG tablet Take 1 tablet (40 mg total) by mouth daily. 90 tablet 1  . glucose blood (FREESTYLE LITE) test strip Test daily before all meals/snacks and once before bedtime.    . insulin glargine (LANTUS) 100 UNIT/ML injection Inject 17 Units into the skin at bedtime.    . insulin lispro (HUMALOG) 100 UNIT/ML injection Inject 0.02-0.1 mLs (2-10 Units total) into the skin 4 (four) times daily  -  before meals and at bedtime. 10 mL 2  . Insulin Pen Needle (LITETOUCH PEN NEEDLES) 31G X 6 MM MISC by Does not apply route.    . Lancets (FREESTYLE) lancets Test daily before all meals/snacks and once before bedtime.    . Rivaroxaban (XARELTO) 15 MG TABS tablet Take 15 mg by mouth daily after breakfast.    .  rosuvastatin (CRESTOR) 20 MG tablet Take 1 tablet (20 mg total) by mouth daily at 6 PM. 90 tablet 2  . spironolactone (ALDACTONE) 25 MG tablet Take 0.5 tablets (12.5 mg total) by mouth daily. 45 tablet 2   No current facility-administered medications for this encounter.   BP 100/62   Pulse 66   Wt 75.4 kg (166 lb 3.2 oz)   SpO2 95%   BMI 25.27 kg/m  General: NAD Neck: No JVD, no thyromegaly or thyroid nodule.  Lungs: Clear to auscultation bilaterally with normal respiratory effort. CV: Nondisplaced PMI.  Heart regular S1/S2, no S3/S4, no murmur.  No peripheral edema.  No carotid bruit.  Normal pedal pulses.  Abdomen: Soft, nontender, no hepatosplenomegaly, no distention.  Skin: Intact without lesions or rashes.  Neurologic: Alert and oriented x 3.  Psych: Normal affect. Extremities: No clubbing or cyanosis.  HEENT: Normal.   Assessment/Plan: 1. Chronic systolic CHF: Ischemic cardiomyopathy.  Echo was done today and reviewed, EF 25%.  Now s/p St Jude CRT-P upgrade (8/20).  He is not volume overloaded on exam though thoracic impedance on device is trending down, NYHA class II symptoms. He has had polyuria with new diagnosis of type II diabetes and lower BP.  - He needs to make sure to take Entresto bid (not once daily), but with polyuria and lower BP, I will not increase the dose yet. - Continue digoxin, check level today.  - Continue spironolactone 12.5 mg daily.  - Continue Coreg 6.25 mg bid.  - Continue Lasix 40 mg daily.  BMET today.  2. CHB: Upgraded to CRT-P [facility] Jude).  3. Aortic stenosis: AS not significant on today's echo.   4. CAD: s/p CABG 1996.  In 2/20, found to  have occlusion of sequential SVG-D and OM.  Therefore, had PCI with DES to ostial LAD and proximal LCx.  Rare chest pain now. - Continue statin.  - No ASA given Xarelto use.  5. Atrial fibrillation: Paroxysmal.  He is in NSR.   - GFR > 50, think he can increase Xarelto to 20 mg daily.  6. CKD stage 3: BMET today.   Followup in 3 wks with pharmacist for med titration, see me in 2 months.   Loralie Champagne 06/09/2019

## 2019-06-09 NOTE — Telephone Encounter (Signed)
Patients daughter aware and voiced understanding Medication list updated and new script sent to pharmacy

## 2019-06-09 NOTE — Telephone Encounter (Signed)
-----   Message from Larey Dresser, MD sent at 06/09/2019 12:52 AM EST ----- Based on his GFR, can increase Xarelto to 20 mg daily (rather than 15 mg daily).

## 2019-06-09 NOTE — Telephone Encounter (Signed)
LM on daughters VM to call office to discuss dose increase.

## 2019-06-10 NOTE — Telephone Encounter (Signed)
Medication Samples have been provided to the patient.  Drug name: Xarelto       Strength: 20mg         Qty: 6  LOT: [id number]  Exp.Date: 11/21  Dosing instructions: Take 1 tablet by mouth Daily  The patient has been instructed regarding the correct time, dose, and frequency of taking this medication, including desired effects and most common side effects.   Marselino Slayton R Quamir Willemsen [id number] AM 06/10/2019

## 2019-06-29 ENCOUNTER — Inpatient Hospital Stay (HOSPITAL_COMMUNITY)
Admission: RE | Admit: 2019-06-29 | Discharge: 2019-06-29 | Disposition: A | Discharge status: Home / Self Care | Payer: Medicare Other | Source: Ambulatory Visit

## 2019-06-29 ENCOUNTER — Telehealth (HOSPITAL_COMMUNITY): Payer: Self-pay | Admitting: *Deleted

## 2019-06-29 NOTE — Telephone Encounter (Signed)
pts daughter left VM stating pt has a 3pm appointment in our clinic but it says pharmacy visit she would like to know what the visit is about before they come. I called her back no answer/left vm requesting return call.

## 2019-06-30 ENCOUNTER — Ambulatory Visit: Payer: Medicare Other | Admitting: Cardiovascular Disease

## 2019-07-01 NOTE — Progress Notes (Addendum)
Cardiology Office Note  Date: 07/02/2019   ID: Ricardo Hunt, DOB Sep 12, 1940, MRN [id number]  PCP:  Loman Brooklyn, FNP  Cardiologist:  Kate Sable, MD Electrophysiologist:  None   Chief Complaint: f/u CAD, HTN, HLD, CHB, ICM, s/p BIV pacemaker upgrade , A Fib, HFrEF  History of Present Illness: Ricardo Hunt is a 79 y.o. male with a history of CAD (CABG x 3 1996), ICM, HLD, HTN, DM, CHB with PPM and Recent BIV upgrade, thrombocytopenia, HFrEF, AS, atrial fibrillation (persistent).    Status post PCI with DES to left circumflex as well as orbital atherectomy and drug-eluting stent placement to LAD on 05/23/2018. Evaluated by advanced heart failure team with echo LVEF of 20 to 25% with severe LV dilation and diffuse hypokinesis with mild to moderate AS.   Previous Pacemaker with CRT-P.  (BIV) upgrade 11/25/2018.  Sees Dr. [name]  Recent Repeat Echo 06/08/2019; showing LVEF 25% with global hypokinesis, grade 1 DD, trivial MR, mild to moderate aortic valve sclerosis/calcification  Patient follows at heart failure clinic with Dr. Aundra Dubin and [place].  He had a visit with Bernerd Pho, [place] in May 01, 2019 complaining of worsening shortness of breath.  According to her note he self discontinued Entresto and Xarelto due to cost and had not taken either of them in several months.  At a follow-up with Dr. Aundra Dubin on May 05, 2019 he was having class II symptoms.  He was restarted on his Entresto 24/26 twice daily.  His digoxin, spironolactone, Coreg, were continued.  An echo was ordered.  Patient states he is actually feeling better since having the biventricular pacemaker placed.  He is now back on his Entresto 24/26 mg p.o. twice daily.  He states the heart failure clinic lowered his Lasix to 40 mg daily. SBP's are low on the low side but he is asymptomatic.  He denies any orthostatic symptoms, presyncopal or presyncopal symptoms.  He states he will follow up soon with the heart  failure clinic.  Advised him to weigh himself daily and adhere to fluid restrictions.  Reinforced the fact that he needs to call the heart failure clinic with weight gain of 3 pounds in 1 day or 5 pounds in 1 week.  He recently discovered he is diabetic and  started on insulin.  States his sugars have improved.  States this morning his blood sugar was 125.  He appears to be tolerating the [facility] well.  Past Medical History:  Diagnosis Date  . Arthritis    " all over the body"  . Atrial fibrillation and flutter ([place])    seen on PPM interrogation; Rivaroxaban started 04/2018  . Chronic systolic CHF (congestive heart failure) ([place])    Echo 05/2018: EF 20-25, severe LVE, diffuse HK with septal and apical akinesis, moderate LAE, moderate MAC, severe aortic valve calcification - degree of AS diff to judge with low EF (likely mild to mod)  . Coronary artery disease    s/p CABG 1996 // LHC 05/2018: oLAD 90, pLAD 100 after Dx, LCx 90 at bifurcation of OM1/OM2, mRCA 50; L-LAD ok; S-D1/OM2 100 >> PCI: DES to mLCx and orbital atherectomy and DES to oLAD  . Diabetes mellitus without complication ([place])   . GERD (gastroesophageal reflux disease)   . Hyperlipidemia   . Hypertension   . Mobitz (type) II atrioventricular block    s/p STJ Accent pacemaker implanted by Dr [name] 10-16-2012  . Myocardial infarction ([place])    per pt.,  told that that the stress test shows that there is a weakening evident on the stress test  . Pacemaker 10/16/2012   St. Jude   . Personal history of colonic polyps-tubular adenomas 08/31/2013  . Thrombocytopenia ([place]) 10/03/2012  . Vertigo     Past Surgical History:  Procedure Laterality Date  . BIV UPGRADE N/A 11/25/2018   Procedure: BIV UPGRADE;  Surgeon: Thompson Grayer, MD;  Location: [place] [facility];  Service: Cardiovascular;  Laterality: N/A;  . cardiac bypass  1996   3 vessels  . CATARACT EXTRACTION W/ INTRAOCULAR LENS  IMPLANT, BILATERAL Bilateral 2010  . COLONOSCOPY    .  CORONARY ARTERY BYPASS GRAFT    . CORONARY ATHERECTOMY N/A 05/23/2018   Procedure: CORONARY ATHERECTOMY;  Surgeon: [name], Peter M, MD;  Location: [place] [facility];  Service: Cardiovascular;  Laterality: N/A;  . CORONARY STENT INTERVENTION N/A 05/23/2018   Procedure: CORONARY STENT INTERVENTION;  Surgeon: [name], Peter M, MD;  Location: [place] [facility];  Service: Cardiovascular;  Laterality: N/A;  . INGUINAL HERNIA REPAIR Left 2010  . LUMBAR LAMINECTOMY/DECOMPRESSION MICRODISCECTOMY Right 07/19/2016   Procedure: Right Lumbar Four-Five Hemilaminectomy and Bilateral Lumbar Two-Three Laminectomy;  Surgeon: Eustace Moore, MD;  Location: [place];  Service: Neurosurgery;  Laterality: Right;  Right Lumbar Four-Five Hemilaminectomy and Bilateral Lumbar Two-Three Laminectomy  . LUMBAR SPINE SURGERY  2006  . NASAL SEPTUM SURGERY  09/01/13   Dr. Adriana Reams  . PACEMAKER INSERTION  10-16-2012   STJ Accent dual chamber pacemaker implanted by Dr [name] for symptomatic Mobitz II heart block  . PERMANENT PACEMAKER INSERTION N/A 10/16/2012   Procedure: PERMANENT PACEMAKER INSERTION;  Surgeon: Thompson Grayer, MD;  Location: [facility];  Service: Cardiovascular;  Laterality: N/A;  . RIGHT/LEFT HEART CATH AND CORONARY/GRAFT ANGIOGRAPHY N/A 05/20/2018   Procedure: RIGHT/LEFT HEART CATH AND CORONARY/GRAFT ANGIOGRAPHY;  Surgeon: [name], Peter M, MD;  Location: [place] [facility];  Service: Cardiovascular;  Laterality: N/A;    Current Outpatient Medications  Medication Sig Dispense Refill  . ACCU-CHEK GUIDE test strip CHECK BLOOD SUGAR FOURETIMES A DAY BEFORE MEALS AT BEDTIME    . acetaminophen (TYLENOL) 500 MG tablet Take 1,000 mg by mouth 2 (two) times daily as needed (for leg pain/pain).     . carvedilol (COREG) 6.25 MG tablet Take 1 tablet (6.25 mg total) by mouth 2 (two) times daily with a meal. 180 tablet 2  . digoxin (LANOXIN) 0.125 MG tablet Take 0.5 tablets (0.0625 mg total) by mouth daily. 45 tablet 2  .  Doxylamine Succinate, Sleep, (SLEEP AID PO) Take 1 tablet by mouth at bedtime as needed (for sleep).    Delene Loll 24-26 MG Take 1 tablet by mouth daily.     . furosemide (LASIX) 40 MG tablet Take 1 tablet (40 mg total) by mouth daily. 90 tablet 1  . glucose blood (FREESTYLE LITE) test strip Test daily before all meals/snacks and once before bedtime.    . insulin glargine (LANTUS) 100 UNIT/ML injection Inject 17 Units into the skin at bedtime.    . insulin lispro (HUMALOG) 100 UNIT/ML injection Inject 0.02-0.1 mLs (2-10 Units total) into the skin 4 (four) times daily -  before meals and at bedtime. 10 mL 2  . Insulin Pen Needle (LITETOUCH PEN NEEDLES) 31G X 6 MM MISC by Does not apply route.    . Lancets (FREESTYLE) lancets Test daily before all meals/snacks and once before bedtime.    . rivaroxaban (XARELTO) 20 MG  TABS tablet Take 1 tablet (20 mg total) by mouth daily after breakfast. 30 tablet 11  . rosuvastatin (CRESTOR) 20 MG tablet Take 1 tablet (20 mg total) by mouth daily at 6 PM. 90 tablet 2  . spironolactone (ALDACTONE) 25 MG tablet Take 0.5 tablets (12.5 mg total) by mouth daily. 45 tablet 2   No current facility-administered medications for this visit.   Allergies:  Patient has no known allergies.   Social History: The patient  reports that he quit smoking about 25 years ago. His smoking use included cigarettes. He has a 80.00 pack-year smoking history. He has never used smokeless tobacco. He reports previous alcohol use. He reports that he does not use drugs.   Family History: The patient's family history includes Breast cancer in his sister and another family member; CAD in his father and mother; Diabetes in his mother; Hypertension in his daughter, father, and son.   ROS:  Please see the history of present illness. Otherwise, complete review of systems is positive for none.  All other systems are reviewed and negative.   Physical Exam: VS:  BP 98/60   Pulse (!) 56   Ht _0   (1.727 m)   Wt 172 lb (78 kg)   SpO2 98%   BMI 26.15 kg/m , BMI Body mass index is 26.15 kg/m.  Wt Readings from Last 3 Encounters:  07/02/19 172 lb (78 kg)  06/08/19 166 lb 3.2 oz (75.4 kg)  06/05/19 168 lb 6.4 oz (76.4 kg)    General: Patient appears comfortable at rest. Neck: Supple, no elevated JVP or carotid bruits, no thyromegaly. Lungs: Clear to auscultation, nonlabored breathing at rest. Cardiac: Regular rate and rhythm, no S3 or significant systolic murmur, no pericardial rub. Extremities: No pitting edema, distal pulses 2+. Skin: Warm and dry. Musculoskeletal: No kyphosis. Neuropsychiatric: Alert and oriented x3, affect grossly appropriate.  ECG: None today  Recent Labwork: 11/16/2018: B Natriuretic Peptide 783.8 11/19/2018: Magnesium 2.2 05/28/2019: Hemoglobin 15.7; Platelets 188 06/08/2019: BUN 20; Creatinine, Ser 1.28; Potassium 4.3; Sodium 133     Component Value Date/Time   CHOL 145 05/05/2019 1642   TRIG 385 (H) 05/05/2019 1642   HDL 28 (L) 05/05/2019 1642   CHOLHDL 5.2 05/05/2019 1642   VLDL 77 (H) 05/05/2019 1642   LDLCALC 40 05/05/2019 1642    Other Studies Reviewed Today:  Echocardiogram 06/08/2019 1. Left ventricular ejection fraction, by estimation, is 25%. The left ventricle has severely decreased function. The left ventricle demonstrates global hypokinesis. Left ventricular diastolic parameters are consistent with Grade I diastolic dysfunction (impaired relaxation). 2. Right ventricular systolic function is mildly reduced. The right ventricular size is normal. Tricuspid regurgitation signal is inadequate for assessing PA pressure. 3. Left atrial size was mildly dilated. 4. The mitral valve is normal in structure. Trivial mitral valve regurgitation. No evidence of mitral stenosis. 5. The aortic valve is tricuspid. Aortic valve regurgitation is not visualized. Mild to moderate aortic valve sclerosis/calcification is present, without any evidence of  aortic stenosis. 6. The inferior vena cava is normal in size with greater than 50% respiratory variability, suggesting right atrial pressure of 3 mmHg.  Cardiac Catheterization: 05/2018  Prox RCA lesion is 40% stenosed.  Mid RCA lesion is 50% stenosed.  Ost LM to Mid LM lesion is 30% stenosed.  Dist LM to Ost LAD lesion is 90% stenosed.  Prox LAD lesion is 100% stenosed.  Prox Cx lesion is 90% stenosed.  Origin to Prox Graft lesion before Ost 1st  Diag is 100% stenosed.  LIMA graft was visualized by angiography and is normal in caliber.  The graft exhibits no disease.  There is severe left ventricular systolic dysfunction.  LV end diastolic pressure is moderately elevated.  The left ventricular ejection fraction is less than 25% by visual estimate.  There is no aortic valve stenosis.  Hemodynamic findings consistent with mild pulmonary hypertension.  1. Severe 2 vessel obstructive CAD.  - 90% ostial LAD supplying a large first diagonal - 100% LAD after the first diagonal. - 90% LCx at bifurcation of OM1 and OM2 - 50% mid RCA 2. Patent LIMA to the LAD 3. Occluded sequential SVG to the first diagonal and OM2 4. Severe LV dysfunction 5. Mild pulmonary venous HTN 6. Elevated LV filling pressures 7. Cardiac index 2.34  Plan: will admit to telemetry. Consult advanced heart failure team to optimize CHF therapy. Will load with Plavix. Anticipate complex PCI later this week including stenting of the LCx and atherectomy and stenting of the left main/ostial LAD.   Coronary Stent Intervention: 05/2018  Ost LM to Mid LM lesion is 30% stenosed.  Dist LM to Ost LAD lesion is 90% stenosed.  A drug-eluting stent was successfully placed using a STENT RESOLUTE ONYX 2.5X26.  Post intervention, there is a 0% residual stenosis.  Prox Cx lesion is 90% stenosed.  Post intervention, there is a 0% residual stenosis.  A drug-eluting stent was successfully placed using  a STENT RESOLUTE ONYX 2.5X15.  1. Successful PCI of the proximal to mid LCx with DES x 1 2. Successful PCI of the ostial LAD with orbital atherectomy and DES x 1.   Plan: DAPT with ASA and Plavix. Continue ASA for one month then discontinue. Continue Plavix for one year. May resume concomitant DOAC therapy tomorrow if stable. Further management per Advanced heart failure team  Echocardiogram: 11/2018 IMPRESSIONS    1. The left ventricle has a visually estimated ejection fraction of 20%.  The cavity size was moderately dilated. There is mildly increased left  ventricular wall thickness. Left ventricular diastolic Doppler parameters  are consistent with impaired  relaxation.  2. There is akinesis of the left ventricular, entire inferior wall.  3. There is akinesis of the left ventricular, mid-apical anteroseptal  wall and apical segment.  4. Left atrial size was mildly dilated.  5. The aortic valve is tricuspid. Mild calcification of the aortic valve.  Mild-moderate stenosis of the aortic valve. Moderate aortic annular  calcification noted.  6. The mitral valve is degenerative. Mild calcification of the mitral  valve leaflet. There is moderate mitral annular calcification present.  7. The tricuspid valve is grossly normal.  8. The aorta is abnormal unless otherwise noted.  9. There is mild dilatation of the aortic root.  Assessment and Plan:  1. CAD in native artery   2. Chronic systolic CHF (congestive heart failure) ([place])   3. Complete heart block ([place])   4. Essential hypertension   5. Hyperlipidemia LDL goal <70   6. Persistent atrial fibrillation ([place])   7. Uncontrolled type 2 diabetes mellitus with hyperglycemia, with long-term current use of insulin ([place])    1. CAD in native artery Remote CABG followed by stent placement x 04 May 2018 ; PCI to proximal to mid LCx with DES x1, and PCI of ostial LAD with orbital atherectomy and DES x1.  He denies any  progressive anginal or exertional symptoms.  States he does have some mild fatigue.  2. Chronic systolic CHF (  congestive heart failure) ([place]) At a previous visit with Bernerd Pho PA 05/01/2019 he c/o of worsening dyspnea. He admitted he did self-discontinue Entresto and Xarelto due to cost. Stated he had not been on both in several months.  Patient has restarted his Entresto 24/26 mg p.o. twice daily.  He continues taking Lasix at a reduced dose of 40 mg daily.  He follows at heart failure clinic with Dr. Aundra Dubin and [place].  3. Complete heart block ([place])  S/P BIV PM upgrade 11/25/2018 Was RV pacing 92% by previous device interrogation. Now s/p CRT-P upgrade 11/25/2018 ECG showed V paced at 68 bpm with QRS 178 ms (compared to 194 ms pre-op/11/24/18) patient states since BiV pacemaker installed he is feeling much better than previously with only occasional mild fatigue.  4. Essential hypertension  Blood pressure is 98/68 clinic today.  Patient states at home his systolic blood pressures have been running anywhere from 100-104.  He denies any orthostatic symptoms/presyncope/syncopal episodes.  5. Hyperlipidemia LDL goal <70 Recent lipid panel on 05/05/2019 showed total cholesterol 145, triglycerides 385, HDL 28, LDL 40.  Continue Crestor 20 mg daily  6. Persistent atrial fibrillation ([place]) CHADSVASC = 5 .  EKG  On 06/08/2019 showed atrial sensed, V paced rhythm rate of 75 with biventricular pacemaker no change since last tracing.  Not on anticoagulation.  No underlying atrial fibrillation noted on EKG.  7. New Onset DM Patient had been complaining recently of weight loss, thirst, frequent urination.  DM type II was discovered.  He is on short and long-acting insulin for management.  States his a.m. blood sugar this morning was 125.  This may be or have been contributing to lower blood pressures secondary to osmotic diuresis in the past.  Medication Adjustments/Labs and Tests Ordered: Current  medicines are reviewed at length with the patient today.  Concerns regarding medicines are outlined above.   Disposition: Follow-up with Dr Bronson Ing or APP  Signed, Levell July, NP 07/02/2019 2:11 PM    [facility] at [address], [place], [place] [address] Phone: [phone]; Fax: [phone]

## 2019-07-02 ENCOUNTER — Ambulatory Visit (INDEPENDENT_AMBULATORY_CARE_PROVIDER_SITE_OTHER): Payer: Medicare Other | Admitting: Family Medicine

## 2019-07-02 ENCOUNTER — Encounter: Payer: Self-pay | Admitting: Family Medicine

## 2019-07-02 ENCOUNTER — Other Ambulatory Visit: Payer: Self-pay

## 2019-07-02 VITALS — BP 98/60 | HR 56 | Ht 68.0 in | Wt 172.0 lb

## 2019-07-02 DIAGNOSIS — Z794 Long term (current) use of insulin: Secondary | ICD-10-CM

## 2019-07-02 DIAGNOSIS — I5022 Chronic systolic (congestive) heart failure: Secondary | ICD-10-CM | POA: Diagnosis not present

## 2019-07-02 DIAGNOSIS — I2510 Atherosclerotic heart disease of native coronary artery without angina pectoris: Secondary | ICD-10-CM

## 2019-07-02 DIAGNOSIS — I442 Atrioventricular block, complete: Secondary | ICD-10-CM

## 2019-07-02 DIAGNOSIS — E1165 Type 2 diabetes mellitus with hyperglycemia: Secondary | ICD-10-CM

## 2019-07-02 DIAGNOSIS — I4819 Other persistent atrial fibrillation: Secondary | ICD-10-CM

## 2019-07-02 DIAGNOSIS — I10 Essential (primary) hypertension: Secondary | ICD-10-CM

## 2019-07-02 DIAGNOSIS — E785 Hyperlipidemia, unspecified: Secondary | ICD-10-CM

## 2019-07-02 NOTE — Patient Instructions (Signed)
Medication Instructions:   Your physician recommends that you continue on your current medications as directed. Please refer to the Current Medication list given to you today.  Labwork:  NONE  Testing/Procedures:  NONE  Follow-Up:  Your physician recommends that you schedule a follow-up appointment in: 3 months with Dr. Bronson Ing (office).   Any Other Special Instructions Will Be Listed Below (If Applicable).  If you need a refill on your cardiac medications before your next appointment, please call your pharmacy.

## 2019-07-11 ENCOUNTER — Other Ambulatory Visit: Payer: Self-pay | Admitting: Cardiology

## 2019-08-03 ENCOUNTER — Other Ambulatory Visit: Payer: Self-pay | Admitting: *Deleted

## 2019-08-05 MED ORDER — TRUEPLUS PEN NEEDLES 31G X 5 MM MISC: 3 refills | Status: DC

## 2019-08-05 MED ORDER — ACCU-CHEK GUIDE VI STRP: ORAL_STRIP | 12 refills | Status: AC

## 2019-08-05 MED ORDER — ACCU-CHEK SOFTCLIX LANCETS MISC: 12 refills | Status: AC

## 2019-08-11 ENCOUNTER — Other Ambulatory Visit: Payer: Self-pay

## 2019-08-11 ENCOUNTER — Ambulatory Visit (HOSPITAL_COMMUNITY)
Admission: RE | Admit: 2019-08-11 | Discharge: 2019-08-11 | Disposition: A | Discharge status: Home / Self Care | Payer: Medicare Other | Source: Ambulatory Visit | Attending: Cardiology | Admitting: Cardiology

## 2019-08-11 VITALS — BP 102/54 | HR 80 | Wt 170.8 lb

## 2019-08-11 DIAGNOSIS — Z794 Long term (current) use of insulin: Secondary | ICD-10-CM | POA: Insufficient documentation

## 2019-08-11 DIAGNOSIS — R0602 Shortness of breath: Secondary | ICD-10-CM | POA: Diagnosis present

## 2019-08-11 DIAGNOSIS — E785 Hyperlipidemia, unspecified: Secondary | ICD-10-CM | POA: Insufficient documentation

## 2019-08-11 DIAGNOSIS — I350 Nonrheumatic aortic (valve) stenosis: Secondary | ICD-10-CM | POA: Diagnosis not present

## 2019-08-11 DIAGNOSIS — Z955 Presence of coronary angioplasty implant and graft: Secondary | ICD-10-CM | POA: Diagnosis not present

## 2019-08-11 DIAGNOSIS — Z8249 Family history of ischemic heart disease and other diseases of the circulatory system: Secondary | ICD-10-CM | POA: Insufficient documentation

## 2019-08-11 DIAGNOSIS — E1122 Type 2 diabetes mellitus with diabetic chronic kidney disease: Secondary | ICD-10-CM | POA: Diagnosis not present

## 2019-08-11 DIAGNOSIS — I480 Paroxysmal atrial fibrillation: Secondary | ICD-10-CM | POA: Diagnosis not present

## 2019-08-11 DIAGNOSIS — N183 Chronic kidney disease, stage 3 unspecified: Secondary | ICD-10-CM | POA: Diagnosis not present

## 2019-08-11 DIAGNOSIS — E781 Pure hyperglyceridemia: Secondary | ICD-10-CM | POA: Insufficient documentation

## 2019-08-11 DIAGNOSIS — I130 Hypertensive heart and chronic kidney disease with heart failure and stage 1 through stage 4 chronic kidney disease, or unspecified chronic kidney disease: Secondary | ICD-10-CM | POA: Diagnosis not present

## 2019-08-11 DIAGNOSIS — I25118 Atherosclerotic heart disease of native coronary artery with other forms of angina pectoris: Secondary | ICD-10-CM | POA: Diagnosis not present

## 2019-08-11 DIAGNOSIS — Z79899 Other long term (current) drug therapy: Secondary | ICD-10-CM | POA: Diagnosis not present

## 2019-08-11 DIAGNOSIS — Z87891 Personal history of nicotine dependence: Secondary | ICD-10-CM | POA: Insufficient documentation

## 2019-08-11 DIAGNOSIS — I255 Ischemic cardiomyopathy: Secondary | ICD-10-CM | POA: Diagnosis not present

## 2019-08-11 DIAGNOSIS — I5022 Chronic systolic (congestive) heart failure: Secondary | ICD-10-CM | POA: Insufficient documentation

## 2019-08-11 DIAGNOSIS — Z951 Presence of aortocoronary bypass graft: Secondary | ICD-10-CM | POA: Insufficient documentation

## 2019-08-11 DIAGNOSIS — I208 Other forms of angina pectoris: Secondary | ICD-10-CM | POA: Diagnosis not present

## 2019-08-11 DIAGNOSIS — Z7901 Long term (current) use of anticoagulants: Secondary | ICD-10-CM | POA: Insufficient documentation

## 2019-08-11 LAB — DIGOXIN LEVEL: Digoxin Level: 0.4 ng/mL — ABNORMAL LOW (ref 0.8–2.0)

## 2019-08-11 LAB — BASIC METABOLIC PANEL
Anion gap: 13 (ref 5–15)
BUN: 33 mg/dL — ABNORMAL HIGH (ref 8–23)
CO2: 22 mmol/L (ref 22–32)
Calcium: 9.2 mg/dL (ref 8.9–10.3)
Chloride: 106 mmol/L (ref 98–111)
Creatinine, Ser: 1.32 mg/dL — ABNORMAL HIGH (ref 0.61–1.24)
GFR calc Af Amer: 59 mL/min — ABNORMAL LOW (ref >60)
GFR calc non Af Amer: 51 mL/min — ABNORMAL LOW (ref >60)
Glucose, Bld: 122 mg/dL — ABNORMAL HIGH (ref 70–99)
Potassium: 4.5 mmol/L (ref 3.5–5.1)
Sodium: 141 mmol/L (ref 135–145)

## 2019-08-11 MED ORDER — FUROSEMIDE 40 MG PO TABS: ORAL_TABLET | ORAL | 3 refills | Status: DC

## 2019-08-11 MED ORDER — SPIRONOLACTONE 25 MG PO TABS: 25.0000 mg | ORAL_TABLET | Freq: Every day | ORAL | 3 refills | Status: DC

## 2019-08-11 MED ORDER — RIVAROXABAN 20 MG PO TABS: 20.0000 mg | ORAL_TABLET | Freq: Every day | ORAL | 5 refills | Status: DC

## 2019-08-11 NOTE — Progress Notes (Signed)
PCP: Loman Brooklyn, FNP HF Cardiology: Dr. Aundra Dubin  79 y.o. with history of CAD, CHB, paroxysmal atrial fibrillation, and ischemic cardiomyopathy presents for followup of CHF. Patient has a long history of CAD and ischemic CMP.  He had CABG x 3 in 1996.  Echo in 7/18 showed EF 20%.  He developed complete heart block and had St Jude PPM placed.  In 2/20, he was admitted with CHF and chest pain, LHC showed occlusion of sequential SVG-D1/OM with 90% proximal LCx stenosis and 90% proximal LAD stenosis (totally occluded mid LAD with patent LIMA-LAD).  He had DES to proximal/mid LCx and DES to ostial/proximal LAD.  Echo in 2/20 showed EF 20-25% with mild-moderate AS.  He has a history of paroxysmal atrial fibrillation, so was maintained on Plavix + Xarelto after PCI.   He was readmitted for a CHF exacerbation in 8/20.  He was diuresed and had mild AKI.  He had upgrade of his St Jude device to CRT-P.    Echo in 3/21 showed EF 25% with diffuse hypokinesis, mildly decreased RV systolic function.    For about a week, he has been more short of breath.  He has dyspnea with moderate exertion that resolves with rest. He was short of breath walking into the office today. No orthopnea/PND.  Weight is up 4 lbs.  He is taking all his meds but ran out of Xarelto.  No lightheadedness.  He has had chest pain episodes for over a year.  He will note mild chest pressure with heavy exertion that will resolve with rest.  No chest pain at rest. He has not used NTG.   St Jude device interrogation: 93% BiV pacing, no AF, thoracic impedance trending down.   REDS clip: 41%.   ECG (personally reviewed): NSR, BiV-paced  Labs (8/20): K 4.1, creatinine 1.23, hgb 15.2  Labs (9/20): K 4, creatinine 1.32, digoxin 0.6 Labs (2/21): K 4.5, creatinine 1.36, LDL 40, TGs 385 Labs (3/21): K 4.3, creatinine 1.28, digoxin 0.5  PMH: 1. CAD: s/p CABG x 3 in 1996.  - LHC in 2/20 showed occluded sequential SVG-D1/OM, patent LIMA-LAD, 90%  proximal LCx, 90% proximal LAD, totally occluded mid LAD.  He had PCI with DES to ostial LAD to restore flow to diagonal, and PCI with DES to proximal/mid LCx.  2. Complete heart block: s/p St Jude PPM placement, later upgraded to CRT.  3. Gout 4. HTN 5. Vertigo 6. Hyperlipidemia 7. Chronic systolic CHF: Ischemic cardiomyopathy.   - Echo in 7/18 with EF 20%.  - Echo (2/20) with EF 20-25%, mild-moderate AS.  - Echo (6/20) with EF 20%, moderate AS.  - Echo (8/20) with EF 20%, mild-moderate AS.  - Upgrade of device to [facility] CRT-P in 8/20.  - Echo (3/21): EF 25% with diffuse hypokinesis, mildly decreased RV systolic function. Aortic valve sclerosis without significant stenosis.  8. Atrial fibrillation: Paroxysmal. 9. Aortic stenosis: Mild to moderate by 8/20 echo.  Mean gradient only 6 mmHg on 3/21 echo.  10. CKD: Stage 3.  11. Type II diabetes  Social History   Socioeconomic History  . Marital status: Widowed    Spouse name: Not on file  . Number of children: Not on file  . Years of education: Not on file  . Highest education level: Not on file  Occupational History  . Not on file  Tobacco Use  . Smoking status: Former Smoker    Packs/day: 2.00    Years: 40.00  Pack years: 80.00    Types: Cigarettes    Quit date: 04/02/1994    Years since quitting: 25.3  . Smokeless tobacco: Never Used  Substance and Sexual Activity  . Alcohol use: Not Currently    Alcohol/week: 0.0 standard drinks  . Drug use: No  . Sexual activity: Not Currently  Other Topics Concern  . Not on file  Social History Narrative  . Not on file   Social Determinants of Health   Financial Resource Strain:   . Difficulty of Paying Living Expenses:   Food Insecurity:   . Worried About Charity fundraiser in the Last Year:   . Arboriculturist in the Last Year:   Transportation Needs:   . Film/video editor (Medical):   Marland Kitchen Lack of Transportation (Non-Medical):   Physical Activity:   . Days of  Exercise per Week:   . Minutes of Exercise per Session:   Stress:   . Feeling of Stress :   Social Connections:   . Frequency of Communication with Friends and Family:   . Frequency of Social Gatherings with Friends and Family:   . Attends Religious Services:   . Active Member of Clubs or Organizations:   . Attends Archivist Meetings:   Marland Kitchen Marital Status:   Intimate Partner Violence:   . Fear of Current or Ex-Partner:   . Emotionally Abused:   Marland Kitchen Physically Abused:   . Sexually Abused:    Family History  Problem Relation Age of Onset  . CAD Father   . Hypertension Father   . CAD Mother   . Diabetes Mother   . Breast cancer Other        Niece  . Hypertension Daughter   . Hypertension Son   . Breast cancer Sister   . Colon cancer Neg Hx   . Pancreatic cancer Neg Hx   . Stomach cancer Neg Hx    ROS: All systems reviewed and negative except as per HPI.   Current Outpatient Medications  Medication Sig Dispense Refill  . ACCU-CHEK GUIDE test strip CHECK BS FOUR TIMES A DAY BEFORE MEALS AT BEDTIME Dx E11.65 100 each 12  . Accu-Chek Softclix Lancets lancets CHECK BS FOUR TIMES A DAY BEFORE MEALS AT BEDTIME Dx E11.65 100 each 12  . carvedilol (COREG) 6.25 MG tablet Take 1 tablet (6.25 mg total) by mouth 2 (two) times daily with a meal. 180 tablet 2  . digoxin (LANOXIN) 0.125 MG tablet Take 0.5 tablets (0.0625 mg total) by mouth daily. 45 tablet 2  . ENTRESTO 24-26 MG Take 1 tablet by mouth daily.     . furosemide (LASIX) 40 MG tablet Take 1 tablet (40 mg total) by mouth every morning AND 0.5 tablets (20 mg total) every evening. 135 tablet 3  . insulin glargine (LANTUS) 100 UNIT/ML injection Inject 17 Units into the skin at bedtime.    . insulin lispro (HUMALOG) 100 UNIT/ML injection Inject 0.02-0.1 mLs (2-10 Units total) into the skin 4 (four) times daily -  before meals and at bedtime. 10 mL 2  . Insulin Pen Needle (TRUEPLUS PEN NEEDLES) 31G X 5 MM MISC Use as directed  with insulin 100 each 3  . rosuvastatin (CRESTOR) 20 MG tablet Take 1 tablet (20 mg total) by mouth daily at 6 PM. 90 tablet 2  . spironolactone (ALDACTONE) 25 MG tablet Take 1 tablet (25 mg total) by mouth daily. 90 tablet 3  . rivaroxaban (XARELTO) 20 MG  TABS tablet Take 1 tablet (20 mg total) by mouth daily with supper. 30 tablet 5   No current facility-administered medications for this encounter.   BP (!) 102/54   Pulse 80   Wt 77.5 kg (170 lb 12.8 oz)   SpO2 97%   BMI 25.97 kg/m  General: NAD Neck: JVP 8 cm, no thyromegaly or thyroid nodule.  Lungs: Clear to auscultation bilaterally with normal respiratory effort. CV: Nondisplaced PMI.  Heart regular S1/S2, no S3/S4, no murmur.  1+ ankle edema.  No carotid bruit.  Normal pedal pulses.  Abdomen: Soft, nontender, no hepatosplenomegaly, no distention.  Skin: Intact without lesions or rashes.  Neurologic: Alert and oriented x 3.  Psych: Normal affect. Extremities: No clubbing or cyanosis.  HEENT: Normal.   Assessment/Plan: 1. Chronic systolic CHF: Ischemic cardiomyopathy.  Echo in 3/21 showed EF 25%.  Now s/p St Jude CRT-P upgrade (8/20).  NYHA class II-III symptoms.  He is mildly volume overloaded on exam with decreasing thoracic impedance, weight up, and REDS clip 41%. - Continue Entresto 24/26 bid, probably does not have BP room to increase. - Continue digoxin, check level today.  - Increase spironolactone to 25 mg daily.  BMET today and in 10 days.   - Continue Coreg 6.25 mg bid.  - Increase Lasix to 40 mg bid x 3 days then 40 qam/20 qpm.  BMET 10 days.  2. CHB: Upgraded to CRT-P [facility] Jude).  3. Aortic stenosis: AS not significant on last echo.  4. CAD: s/p CABG 1996.  In 2/20, found to have occlusion of sequential SVG-D and OM.  Therefore, had PCI with DES to ostial LAD and proximal LCx.  He has fairly mild chronic stable angina x > 1 year.  - Continue statin. Good LDL in 2/21. Triglycerides elevated, will discuss Vascepa at  next visit, but currently trying to work out finances of keeping him on Xarelto so not sure Vascepa will be an option.  - No ASA given Xarelto use.  5. Atrial fibrillation: Paroxysmal.  He is in NSR.   - Continue Xarelto 20 mg daily.  6. CKD stage 3: BMET today.   Followup in 3 wks with NP/PA to reassess volume status.   Loralie Champagne 08/11/2019

## 2019-08-11 NOTE — Progress Notes (Signed)
Medication Samples have been provided to the patient.  Drug name: Xarelto       Strength: 20mg         Qty: 4  LOT: [id number]  Exp.Date: 02/2020  Dosing instructions: Take 1 tablet by mouth every evening  The patient has been instructed regarding the correct time, dose, and frequency of taking this medication, including desired effects and most common side effects.   Parry Po R Anuj Summons [id number] PM 08/11/2019

## 2019-08-11 NOTE — Patient Instructions (Addendum)
Labs done today. We will contact you only if your labs are abnormal.  RESTART Xarelto 24m(1 tablet) by mouth every evening.(a month supply of samples were provided to you today.)  To help decrease the cost of your Xarelto we will provide you with sample medications every other month until your out of pocket deductible is met.   INCREASE Spironolactone 271m1 tablet) by mouth daily.(a new Rx was sent into your pharmacy.)  INCREASE Furosemide to 4048m tablets) by mouth twice a day for 3 days THEN take 71m60mtablets) by mouth every morning and 20mg49mablet) by mouth every evening.   No other medication changes were made. Please continue all other medications as prescribed.    Your physician recommends that you schedule a follow-up appointment in: 10 days for a lab only appointment and in 3 weeks with the APP clinic(our office).  At the [facility] and your health needs are our priority. As part of our continuing mission to provide you with exceptional heart care, we have created designated Provider Care Teams. These Care Teams include your primary Cardiologist (physician) and Advanced Practice Providers (APPs- Physician Assistants and Nurse Practitioners) who all work together to provide you with the care you need, when you need it.   You may see any of the following providers on your designated Care Team at your next follow up: . Dr Marland KitchenanieGlori Bickers DaltoLoralie Champagney CDarrick Grinder. BrittLyda Jester. LaureAudry RilesrmD   Please be sure to bring in all your medications bottles to every appointment.

## 2019-08-25 ENCOUNTER — Ambulatory Visit: Payer: Medicare Other

## 2019-08-25 ENCOUNTER — Other Ambulatory Visit (HOSPITAL_COMMUNITY): Payer: Medicare Other

## 2019-08-26 ENCOUNTER — Telehealth: Payer: Self-pay

## 2019-08-26 NOTE — Telephone Encounter (Signed)
Left message for patient to remind of missed remote transmission.  

## 2019-08-28 ENCOUNTER — Other Ambulatory Visit (HOSPITAL_COMMUNITY): Payer: Self-pay | Admitting: Cardiology

## 2019-09-04 ENCOUNTER — Other Ambulatory Visit: Payer: Self-pay | Admitting: *Deleted

## 2019-09-04 MED ORDER — TRUEPLUS PEN NEEDLES 31G X 5 MM MISC: 3 refills | Status: AC

## 2019-09-07 ENCOUNTER — Ambulatory Visit (HOSPITAL_COMMUNITY)
Admission: RE | Admit: 2019-09-07 | Discharge: 2019-09-07 | Disposition: A | Discharge status: Home / Self Care | Payer: Medicare Other | Source: Ambulatory Visit | Attending: Cardiology | Admitting: Cardiology

## 2019-09-07 ENCOUNTER — Telehealth: Payer: Self-pay | Admitting: Cardiovascular Disease

## 2019-09-07 ENCOUNTER — Encounter (HOSPITAL_COMMUNITY): Payer: Self-pay

## 2019-09-07 ENCOUNTER — Telehealth (HOSPITAL_COMMUNITY): Payer: Self-pay

## 2019-09-07 ENCOUNTER — Other Ambulatory Visit: Payer: Self-pay

## 2019-09-07 VITALS — BP 94/46 | HR 56 | Wt 166.8 lb

## 2019-09-07 DIAGNOSIS — Z79899 Other long term (current) drug therapy: Secondary | ICD-10-CM | POA: Insufficient documentation

## 2019-09-07 DIAGNOSIS — I442 Atrioventricular block, complete: Secondary | ICD-10-CM | POA: Diagnosis not present

## 2019-09-07 DIAGNOSIS — Z7902 Long term (current) use of antithrombotics/antiplatelets: Secondary | ICD-10-CM | POA: Diagnosis not present

## 2019-09-07 DIAGNOSIS — I350 Nonrheumatic aortic (valve) stenosis: Secondary | ICD-10-CM | POA: Insufficient documentation

## 2019-09-07 DIAGNOSIS — I480 Paroxysmal atrial fibrillation: Secondary | ICD-10-CM | POA: Diagnosis not present

## 2019-09-07 DIAGNOSIS — Z7901 Long term (current) use of anticoagulants: Secondary | ICD-10-CM | POA: Diagnosis not present

## 2019-09-07 DIAGNOSIS — Z955 Presence of coronary angioplasty implant and graft: Secondary | ICD-10-CM | POA: Insufficient documentation

## 2019-09-07 DIAGNOSIS — I130 Hypertensive heart and chronic kidney disease with heart failure and stage 1 through stage 4 chronic kidney disease, or unspecified chronic kidney disease: Secondary | ICD-10-CM | POA: Insufficient documentation

## 2019-09-07 DIAGNOSIS — I25118 Atherosclerotic heart disease of native coronary artery with other forms of angina pectoris: Secondary | ICD-10-CM | POA: Diagnosis not present

## 2019-09-07 DIAGNOSIS — Z951 Presence of aortocoronary bypass graft: Secondary | ICD-10-CM | POA: Diagnosis not present

## 2019-09-07 DIAGNOSIS — N183 Chronic kidney disease, stage 3 unspecified: Secondary | ICD-10-CM | POA: Insufficient documentation

## 2019-09-07 DIAGNOSIS — Z8249 Family history of ischemic heart disease and other diseases of the circulatory system: Secondary | ICD-10-CM | POA: Diagnosis not present

## 2019-09-07 DIAGNOSIS — Z833 Family history of diabetes mellitus: Secondary | ICD-10-CM | POA: Insufficient documentation

## 2019-09-07 DIAGNOSIS — I255 Ischemic cardiomyopathy: Secondary | ICD-10-CM | POA: Insufficient documentation

## 2019-09-07 DIAGNOSIS — Z87891 Personal history of nicotine dependence: Secondary | ICD-10-CM | POA: Insufficient documentation

## 2019-09-07 DIAGNOSIS — I5022 Chronic systolic (congestive) heart failure: Secondary | ICD-10-CM

## 2019-09-07 DIAGNOSIS — E785 Hyperlipidemia, unspecified: Secondary | ICD-10-CM | POA: Diagnosis not present

## 2019-09-07 DIAGNOSIS — Z794 Long term (current) use of insulin: Secondary | ICD-10-CM | POA: Insufficient documentation

## 2019-09-07 DIAGNOSIS — Z95810 Presence of automatic (implantable) cardiac defibrillator: Secondary | ICD-10-CM | POA: Insufficient documentation

## 2019-09-07 DIAGNOSIS — E1122 Type 2 diabetes mellitus with diabetic chronic kidney disease: Secondary | ICD-10-CM | POA: Diagnosis not present

## 2019-09-07 LAB — CUP PACEART REMOTE DEVICE CHECK
Battery Remaining Longevity: 101 mo
Battery Remaining Percentage: 95.5 %
Battery Voltage: 3.01 V
Brady Statistic AP VP Percent: 30 %
Brady Statistic AP VS Percent: 1 %
Brady Statistic AS VP Percent: 63 %
Brady Statistic AS VS Percent: 1.3 %
Brady Statistic RA Percent Paced: 23 %
Date Time Interrogation Session: 20210525020012
Implantable Lead Implant Date: 20140717
Implantable Lead Implant Date: 20140717
Implantable Lead Implant Date: 20200825
Implantable Lead Location: 753858
Implantable Lead Location: 753859
Implantable Lead Location: 753860
Implantable Lead Model: 1948
Implantable Pulse Generator Implant Date: 20200825
Lead Channel Impedance Value: 410 Ohm
Lead Channel Impedance Value: 630 Ohm
Lead Channel Impedance Value: 990 Ohm
Lead Channel Pacing Threshold Amplitude: 0.625 V
Lead Channel Pacing Threshold Amplitude: 1 V
Lead Channel Pacing Threshold Amplitude: 1 V
Lead Channel Pacing Threshold Pulse Width: 0.4 ms
Lead Channel Pacing Threshold Pulse Width: 0.4 ms
Lead Channel Pacing Threshold Pulse Width: 0.5 ms
Lead Channel Sensing Intrinsic Amplitude: 12 mV
Lead Channel Sensing Intrinsic Amplitude: 3 mV
Lead Channel Setting Pacing Amplitude: 2 V
Lead Channel Setting Pacing Amplitude: 2.25 V
Lead Channel Setting Pacing Amplitude: 2.5 V
Lead Channel Setting Pacing Pulse Width: 0.4 ms
Lead Channel Setting Pacing Pulse Width: 0.5 ms
Lead Channel Setting Sensing Sensitivity: 4 mV
Pulse Gen Model: 3562
Pulse Gen Serial Number: 9158588

## 2019-09-07 LAB — BASIC METABOLIC PANEL
Anion gap: 10 (ref 5–15)
BUN: 50 mg/dL — ABNORMAL HIGH (ref 8–23)
CO2: 26 mmol/L (ref 22–32)
Calcium: 9.1 mg/dL (ref 8.9–10.3)
Chloride: 104 mmol/L (ref 98–111)
Creatinine, Ser: 1.48 mg/dL — ABNORMAL HIGH (ref 0.61–1.24)
GFR calc Af Amer: 51 mL/min — ABNORMAL LOW (ref >60)
GFR calc non Af Amer: 44 mL/min — ABNORMAL LOW (ref >60)
Glucose, Bld: 189 mg/dL — ABNORMAL HIGH (ref 70–99)
Potassium: 4.4 mmol/L (ref 3.5–5.1)
Sodium: 140 mmol/L (ref 135–145)

## 2019-09-07 LAB — CBC
HCT: 47.8 % (ref 39.0–52.0)
Hemoglobin: 15.5 g/dL (ref 13.0–17.0)
MCH: 32.8 pg (ref 26.0–34.0)
MCHC: 32.4 g/dL (ref 30.0–36.0)
MCV: 101.1 fL — ABNORMAL HIGH (ref 80.0–100.0)
Platelets: 179 10*3/uL (ref 150–400)
RBC: 4.73 MIL/uL (ref 4.22–5.81)
RDW: 13.9 % (ref 11.5–15.5)
WBC: 7.1 10*3/uL (ref 4.0–10.5)
nRBC: 0 % (ref 0.0–0.2)

## 2019-09-07 LAB — DIGOXIN LEVEL: Digoxin Level: 0.9 ng/mL (ref 0.8–2.0)

## 2019-09-07 MED ORDER — FUROSEMIDE 40 MG PO TABS: 40.0000 mg | ORAL_TABLET | Freq: Every day | ORAL | 3 refills | Status: DC

## 2019-09-07 MED ORDER — SPIRONOLACTONE 25 MG PO TABS: 12.5000 mg | ORAL_TABLET | Freq: Every day | ORAL | 1 refills | Status: DC

## 2019-09-07 NOTE — Patient Instructions (Signed)
CHANGE Spironolactone to 25 mg, one tab daily at bedtime -if you remain dizzy after one week DECREASE to 12.5 mg daily at bedtime  Labs today We will only contact you if something comes back abnormal or we need to make some changes. Otherwise no news is good news!  Your physician recommends that you schedule a follow-up appointment in: 3 months with Dr Aundra Dubin  Do the following things EVERYDAY: 1) Weigh yourself in the morning before breakfast. Write it down and keep it in a log. 2) Take your medicines as prescribed 3) Eat low salt foods--Limit salt (sodium) to 2000 mg per day.  4) Stay as active as you can everyday 5) Limit all fluids for the day to less than 2 liters  At the [facility], you and your health needs are our priority. As part of our continuing mission to provide you with exceptional heart care, we have created designated Provider Care Teams. These Care Teams include your primary Cardiologist (physician) and Advanced Practice Providers (APPs- Physician Assistants and Nurse Practitioners) who all work together to provide you with the care you need, when you need it.   You may see any of the following providers on your designated Care Team at your next follow up:  Dr Glori Bickers  Dr Haynes Kerns, NP  Lyda Jester, [place]  Audry Riles, PharmD   Please be sure to bring in all your medications bottles to every appointment.

## 2019-09-07 NOTE — Telephone Encounter (Signed)
Orders Placed This Encounter  Procedures  . Basic Metabolic Panel (BMET)    Standing Status:   Future    Standing Expiration Date:   09/06/2020    Order Specific Question:   Release to patient    Answer:   Immediate   Patient advised and verbalized understanding.lab appt scheduled,lab order entered,med list updated to reflect changes

## 2019-09-07 NOTE — Progress Notes (Signed)
Medication Samples have been provided to the patient.  Drug name: xarelto       Strength: 20mg         Qty: 28  LOT: [id number]  Exp.Date: 02/2020  Dosing instructions: one tab daily  The patient has been instructed regarding the correct time, dose, and frequency of taking this medication, including desired effects and most common side effects.   Kerry Dory 10:26 AM 09/07/2019

## 2019-09-07 NOTE — Progress Notes (Signed)
ReDS Vest / Clip - 09/07/19 0800      ReDS Vest / Clip   Station Marker  C    Ruler Value  33    ReDS Value Range  Low volume    ReDS Actual Value  31    Anatomical Comments  sitting

## 2019-09-07 NOTE — Telephone Encounter (Signed)
error 

## 2019-09-07 NOTE — Addendum Note (Signed)
Encounter addended by: Kerry Dory, CMA on: 09/07/2019 10:27 AM  Actions taken: Clinical Note Signed

## 2019-09-07 NOTE — Telephone Encounter (Signed)
-----   Message from Larey Dresser, MD sent at 09/07/2019 12:39 PM EDT ----- Creatinine higher, decrease Lasix to 40 mg daily, check BMET 2 wks.

## 2019-09-07 NOTE — Progress Notes (Signed)
PCP: Loman Brooklyn, FNP HF Cardiology: Dr. Aundra Dubin  Reason for Visit: f/u for chronic systolic heart failure  79 y.o. with history of CAD, CHB, paroxysmal atrial fibrillation, and ischemic cardiomyopathy presents for followup of CHF. Patient has a long history of CAD and ischemic CMP.  He had CABG x 3 in 1996.  Echo in 7/18 showed EF 20%.  He developed complete heart block and had St Jude PPM placed.  In 2/20, he was admitted with CHF and chest pain, LHC showed occlusion of sequential SVG-D1/OM with 90% proximal LCx stenosis and 90% proximal LAD stenosis (totally occluded mid LAD with patent LIMA-LAD).  He had DES to proximal/mid LCx and DES to ostial/proximal LAD.  Echo in 2/20 showed EF 20-25% with mild-moderate AS.  He has a history of paroxysmal atrial fibrillation, so was maintained on Plavix + Xarelto after PCI.   He was readmitted for a CHF exacerbation in 8/20.  He was diuresed and had mild AKI.  He had upgrade of his St Jude device to CRT-P.    Echo in 3/21 showed EF 25% with diffuse hypokinesis, mildly decreased RV systolic function.   Recently seen in clinic 5/11 w/ increased exertional dyspnea and 4 lb wt gain. ReDs clip elevated at 41%. Diuretics adjusted.  Lasix increased to 40 mg bid x 3 days then 40 qam/20 qpm. Arlyce Harman increased to 25 mg daily. Entresto continued at 24/26 bid. BP was too soft for titration.   He returns today for f/u. Volume status improved. Wt down 4 lb from 170>>166 lb. ReDs clip 31%. Breathing improved. Denies CP. BP soft in the upper 53Z systolic. Notes occasional dizziness but no syncope/ near syncope. RRR on exam. Denies abnormal bleeding w/ Xarelto.    REDS clip: 31%    Labs (8/20): K 4.1, creatinine 1.23, hgb 15.2  Labs (9/20): K 4, creatinine 1.32, digoxin 0.6 Labs (2/21): K 4.5, creatinine 1.36, LDL 40, TGs 385 Labs (3/21): K 4.3, creatinine 1.28, digoxin 0.5 Labs (5/21): K 4.5, creatinine 1.32, digoxin 0.4   PMH: 1. CAD: s/p CABG x 3 in 1996.   - LHC in 2/20 showed occluded sequential SVG-D1/OM, patent LIMA-LAD, 90% proximal LCx, 90% proximal LAD, totally occluded mid LAD.  He had PCI with DES to ostial LAD to restore flow to diagonal, and PCI with DES to proximal/mid LCx.  2. Complete heart block: s/p St Jude PPM placement, later upgraded to CRT.  3. Gout 4. HTN 5. Vertigo 6. Hyperlipidemia 7. Chronic systolic CHF: Ischemic cardiomyopathy.   - Echo in 7/18 with EF 20%.  - Echo (2/20) with EF 20-25%, mild-moderate AS.  - Echo (6/20) with EF 20%, moderate AS.  - Echo (8/20) with EF 20%, mild-moderate AS.  - Upgrade of device to [facility] CRT-P in 8/20.  - Echo (3/21): EF 25% with diffuse hypokinesis, mildly decreased RV systolic function. Aortic valve sclerosis without significant stenosis.  8. Atrial fibrillation: Paroxysmal. 9. Aortic stenosis: Mild to moderate by 8/20 echo.  Mean gradient only 6 mmHg on 3/21 echo.  10. CKD: Stage 3.  11. Type II diabetes  Social History   Socioeconomic History  . Marital status: Widowed    Spouse name: Not on file  . Number of children: Not on file  . Years of education: Not on file  . Highest education level: Not on file  Occupational History  . Not on file  Tobacco Use  . Smoking status: Former Smoker    Packs/day: 2.00  Years: 40.00    Pack years: 80.00    Types: Cigarettes    Quit date: 04/02/1994    Years since quitting: 25.4  . Smokeless tobacco: Never Used  Substance and Sexual Activity  . Alcohol use: Not Currently    Alcohol/week: 0.0 standard drinks  . Drug use: No  . Sexual activity: Not Currently  Other Topics Concern  . Not on file  Social History Narrative  . Not on file   Social Determinants of Health   Financial Resource Strain:   . Difficulty of Paying Living Expenses:   Food Insecurity:   . Worried About Charity fundraiser in the Last Year:   . Arboriculturist in the Last Year:   Transportation Needs:   . Film/video editor (Medical):   Marland Kitchen Lack  of Transportation (Non-Medical):   Physical Activity:   . Days of Exercise per Week:   . Minutes of Exercise per Session:   Stress:   . Feeling of Stress :   Social Connections:   . Frequency of Communication with Friends and Family:   . Frequency of Social Gatherings with Friends and Family:   . Attends Religious Services:   . Active Member of Clubs or Organizations:   . Attends Archivist Meetings:   Marland Kitchen Marital Status:   Intimate Partner Violence:   . Fear of Current or Ex-Partner:   . Emotionally Abused:   Marland Kitchen Physically Abused:   . Sexually Abused:    Family History  Problem Relation Age of Onset  . CAD Father   . Hypertension Father   . CAD Mother   . Diabetes Mother   . Breast cancer Other        Niece  . Hypertension Daughter   . Hypertension Son   . Breast cancer Sister   . Colon cancer Neg Hx   . Pancreatic cancer Neg Hx   . Stomach cancer Neg Hx    ROS: All systems reviewed and negative except as per HPI.   Current Outpatient Medications  Medication Sig Dispense Refill  . ACCU-CHEK GUIDE test strip CHECK BS FOUR TIMES A DAY BEFORE MEALS AT BEDTIME Dx E11.65 100 each 12  . Accu-Chek Softclix Lancets lancets CHECK BS FOUR TIMES A DAY BEFORE MEALS AT BEDTIME Dx E11.65 100 each 12  . carvedilol (COREG) 6.25 MG tablet Take 1 tablet (6.25 mg total) by mouth 2 (two) times daily with a meal. 180 tablet 2  . digoxin (LANOXIN) 0.125 MG tablet Take 0.5 tablets (0.0625 mg total) by mouth daily. 45 tablet 2  . ENTRESTO 24-26 MG TAKE  (1)  TABLET TWICE A DAY. 180 tablet 3  . furosemide (LASIX) 40 MG tablet Take 40 mg by mouth daily.    . insulin glargine (LANTUS) 100 UNIT/ML injection Inject 17 Units into the skin at bedtime.    . insulin lispro (HUMALOG) 100 UNIT/ML injection Inject 0.02-0.1 mLs (2-10 Units total) into the skin 4 (four) times daily -  before meals and at bedtime. 10 mL 2  . Insulin Pen Needle (TRUEPLUS PEN NEEDLES) 31G X 5 MM MISC Use five times  daily with insulin Dx E11.65 500 each 3  . rivaroxaban (XARELTO) 20 MG TABS tablet Take 1 tablet (20 mg total) by mouth daily with supper. 30 tablet 5  . rosuvastatin (CRESTOR) 20 MG tablet Take 1 tablet (20 mg total) by mouth daily at 6 PM. 90 tablet 2  . spironolactone (ALDACTONE) 25 MG  tablet Take 0.5 tablets (12.5 mg total) by mouth at bedtime. 45 tablet 1   No current facility-administered medications for this encounter.   BP (!) 94/46   Pulse (!) 56   Wt 75.7 kg (166 lb 12.8 oz)   SpO2 94%   BMI 25.36 kg/m  PHYSICAL EXAM: General:  Well appearing. No respiratory difficulty HEENT: normal Neck: supple. no JVD. Carotids 2+ bilat; no bruits. No lymphadenopathy or thyromegaly appreciated. Cor: PMI nondisplaced. Regular rate & rhythm. No rubs, gallops or murmurs. Lungs: clear Abdomen: soft, nontender, nondistended. No hepatosplenomegaly. No bruits or masses. Good bowel sounds. Extremities: no cyanosis, clubbing, rash, edema Neuro: alert & oriented x 3, cranial nerves grossly intact. moves all 4 extremities w/o difficulty. Affect pleasant.   Assessment/Plan: 1. Chronic systolic CHF: Ischemic cardiomyopathy.  Echo in 3/21 showed EF 25%.  Now s/p St Jude CRT-P upgrade (8/20).  NYHA class II-III symptoms.  Euvolemic on exam and by ReDs clip, 31%. - Continue Entresto 24/26 bid. BP too soft for titration  - Continue digoxin, check level today.  - Continue spironolactone 25 mg but change to bedtime. If he continues to have dizziness after 1 week, he was advised to reduce back down to 12.5 mg daily.    - Continue Coreg 6.25 mg bid.  - Continue Lasix 40 qam/20 qpm.  Repeat BMP today  - discussed daily wts. He will call if > 3 lb gain in 24 hrs or > 5 lb gain in 1 week  2. CHB: Upgraded to CRT-P [employer]). Followed by Dr. [name]  3. Aortic stenosis: AS not significant on last echo (mild-moderate) 4. CAD: s/p CABG 1996.  In 2/20, found to have occlusion of sequential SVG-D and OM.  Therefore,  had PCI with DES to ostial LAD and proximal LCx.  He has fairly mild chronic stable angina x > 1 year.  - Continue statin. Good LDL in 2/21. Triglycerides elevated, will discuss Vascepa at next visit, but currently trying to work out finances of keeping him on Xarelto so not sure Vascepa will be an option.  - No ASA given Xarelto use.  5. Atrial fibrillation: Paroxysmal.  He is in NSR.   - Continue Xarelto 20 mg daily.  - Check CBC today  6. CKD stage 3: Check BMP today   F/u w/ Dr. Aundra Dubin in 3 months, or sooner if needed.    Lyda Jester, PA-C  09/07/2019

## 2019-09-07 NOTE — Telephone Encounter (Signed)
Accessed pt's chart due to Ricardo Hunt the patient's daughter calling stating she was returning a call in regards to Ricardo Hunt's lab results. I advised Ricardo Hunt it was Dr. Claris Gladden office that called and then transferred her to his office.

## 2019-09-10 ENCOUNTER — Telehealth: Payer: Self-pay | Admitting: *Deleted

## 2019-09-10 ENCOUNTER — Encounter: Payer: Self-pay | Admitting: Family Medicine

## 2019-09-10 ENCOUNTER — Other Ambulatory Visit: Payer: Self-pay

## 2019-09-10 ENCOUNTER — Ambulatory Visit (INDEPENDENT_AMBULATORY_CARE_PROVIDER_SITE_OTHER): Payer: Medicare Other | Admitting: Family Medicine

## 2019-09-10 VITALS — BP 99/52 | HR 40 | Temp 97.1°F | Ht 68.0 in | Wt 168.6 lb

## 2019-09-10 DIAGNOSIS — Z794 Long term (current) use of insulin: Secondary | ICD-10-CM

## 2019-09-10 DIAGNOSIS — E1165 Type 2 diabetes mellitus with hyperglycemia: Secondary | ICD-10-CM | POA: Diagnosis not present

## 2019-09-10 LAB — BAYER DCA HB A1C WAIVED: HB A1C (BAYER DCA - WAIVED): 6.3 % (ref <7.0)

## 2019-09-10 NOTE — Telephone Encounter (Signed)
If the pulse readings were done with a pulse oximeter they may not be accurate for someone with a paced rhythm if the patient  has  PVCs or PACs at the time the reading is taken. His device function is WNL on the 08/25/19 transmission and he had PVCs 5.5% of the time. His lower rate is set at 60 bpm.

## 2019-09-10 NOTE — Progress Notes (Signed)
Assessment & Plan:  1. Uncontrolled type 2 diabetes mellitus with hyperglycemia, with long-term current use of insulin (HCC) Lab Results  Component Value Date   HGBA1C 6.3 09/10/2019   HGBA1C >14.0 (H) 05/28/2019   HGBA1C 6.2 (H) 05/21/2018  - Diabetes is at goal of A1c < 7. - Medications: continue current medications - Home glucose monitoring: continue monitoring - Patient is currently taking a statin. Patient is taking an ACE-inhibitor/ARB.  - Last foot exam: 09/10/2019 - Last diabetic eye exam: unknown - Urine Microalbumin/Creat Ratio: 09/10/2019 - Instruction/counseling given: reminded to get eye exam, discussed diet and provided printed educational material - Microalbumin / creatinine urine ratio - Bayer DCA Hb A1c Waived   Return in about 6 months (around 03/11/2020) for DM.  Hendricks Limes, MSN, APRN, FNP-C [facility]  Subjective:    Patient ID: Ricardo Hunt, male    DOB: [birth year], 79 y.o.   MRN: [id number]  Patient Care Team: Loman Brooklyn, FNP as PCP - General (Family Medicine) Herminio Commons, MD as PCP - Cardiology (Cardiology) Thompson Grayer, MD as Consulting Physician (Cardiology) Herminio Commons, MD as Consulting Physician (Cardiology) Larey Dresser, MD as Consulting Physician (Cardiology)   Chief Complaint:  Chief Complaint  Patient presents with  . Diabetes    3 month follow up    HPI: Ricardo Hunt is a 79 y.o. male presenting on 09/10/2019 for Diabetes (3 month follow up)  Patient is accompanied by his daughter, [name], who he is okay with being present.   Diabetes: Patient presents for follow up of diabetes. Current symptoms include: none. Known diabetic complications: cardiovascular disease. Medication compliance: only taking Lantus at night. Current diet: in general, an "unhealthy" diet. Current exercise: yard work. Home blood sugar records: postprandial BG 150-200. Is he  on ACE inhibitor or angiotensin II  receptor blocker? Yes. Is he on a statin? Yes.   Last A1c was >14.0 on 05/28/19. A1c 6.3 today.   Lab Results  Component Value Date   LDLCALC 40 05/05/2019   CREATININE 1.48 (H) 09/07/2019     New complaints: None  Social history:  Relevant past medical, surgical, family and social history reviewed and updated as indicated. Interim medical history since our last visit reviewed.  Allergies and medications reviewed and updated.  DATA REVIEWED: CHART IN EPIC  ROS: Negative unless specifically indicated above in HPI.    Current Outpatient Medications:  .  ACCU-CHEK GUIDE test strip, CHECK BS FOUR TIMES A DAY BEFORE MEALS AT BEDTIME Dx E11.65, Disp: 100 each, Rfl: 12 .  Accu-Chek Softclix Lancets lancets, CHECK BS FOUR TIMES A DAY BEFORE MEALS AT BEDTIME Dx E11.65, Disp: 100 each, Rfl: 12 .  carvedilol (COREG) 6.25 MG tablet, Take 1 tablet (6.25 mg total) by mouth 2 (two) times daily with a meal., Disp: 180 tablet, Rfl: 2 .  digoxin (LANOXIN) 0.125 MG tablet, Take 0.5 tablets (0.0625 mg total) by mouth daily., Disp: 45 tablet, Rfl: 2 .  ENTRESTO 24-26 MG, TAKE  (1)  TABLET TWICE A DAY., Disp: 180 tablet, Rfl: 3 .  furosemide (LASIX) 40 MG tablet, Take 1 tablet (40 mg total) by mouth daily., Disp: 90 tablet, Rfl: 3 .  insulin glargine (LANTUS) 100 UNIT/ML injection, Inject 17 Units into the skin at bedtime., Disp: , Rfl:  .  insulin lispro (HUMALOG) 100 UNIT/ML injection, Inject 0.02-0.1 mLs (2-10 Units total) into the skin 4 (four) times daily -  before meals and at  bedtime., Disp: 10 mL, Rfl: 2 .  Insulin Pen Needle (TRUEPLUS PEN NEEDLES) 31G X 5 MM MISC, Use five times daily with insulin Dx E11.65, Disp: 500 each, Rfl: 3 .  rivaroxaban (XARELTO) 20 MG TABS tablet, Take 1 tablet (20 mg total) by mouth daily with supper., Disp: 30 tablet, Rfl: 5 .  rosuvastatin (CRESTOR) 20 MG tablet, Take 1 tablet (20 mg total) by mouth daily at 6 PM., Disp: 90 tablet, Rfl: 2 .  spironolactone  (ALDACTONE) 25 MG tablet, Take 0.5 tablets (12.5 mg total) by mouth at bedtime., Disp: 45 tablet, Rfl: 1   No Known Allergies Past Medical History:  Diagnosis Date  . Arthritis    " all over the body"  . Atrial fibrillation and flutter ([place])    seen on PPM interrogation; Rivaroxaban started 04/2018  . Chronic systolic CHF (congestive heart failure) ([place])    Echo 05/2018: EF 20-25, severe LVE, diffuse HK with septal and apical akinesis, moderate LAE, moderate MAC, severe aortic valve calcification - degree of AS diff to judge with low EF (likely mild to mod)  . Coronary artery disease    s/p CABG 1996 // LHC 05/2018: oLAD 90, pLAD 100 after Dx, LCx 90 at bifurcation of OM1/OM2, mRCA 50; L-LAD ok; S-D1/OM2 100 >> PCI: DES to mLCx and orbital atherectomy and DES to oLAD  . Diabetes mellitus without complication ([place])   . GERD (gastroesophageal reflux disease)   . Hyperlipidemia   . Hypertension   . Mobitz (type) II atrioventricular block    s/p STJ Accent pacemaker implanted by Dr [name] 10-16-2012  . Myocardial infarction [facility])    per pt., told that that the stress test shows that there is a weakening evident on the stress test  . Pacemaker 10/16/2012   St. Jude   . Personal history of colonic polyps-tubular adenomas 08/31/2013  . Thrombocytopenia ([place]) 10/03/2012  . Vertigo     Past Surgical History:  Procedure Laterality Date  . BIV UPGRADE N/A 11/25/2018   Procedure: BIV UPGRADE;  Surgeon: Thompson Grayer, MD;  Location: [place] [facility];  Service: Cardiovascular;  Laterality: N/A;  . cardiac bypass  1996   3 vessels  . CATARACT EXTRACTION W/ INTRAOCULAR LENS  IMPLANT, BILATERAL Bilateral 2010  . COLONOSCOPY    . CORONARY ARTERY BYPASS GRAFT    . CORONARY ATHERECTOMY N/A 05/23/2018   Procedure: CORONARY ATHERECTOMY;  Surgeon: [name], Peter M, MD;  Location: [place] [facility];  Service: Cardiovascular;  Laterality: N/A;  . CORONARY STENT INTERVENTION N/A 05/23/2018   Procedure: CORONARY  STENT INTERVENTION;  Surgeon: [name], Peter M, MD;  Location: [place] [facility];  Service: Cardiovascular;  Laterality: N/A;  . INGUINAL HERNIA REPAIR Left 2010  . LUMBAR LAMINECTOMY/DECOMPRESSION MICRODISCECTOMY Right 07/19/2016   Procedure: Right Lumbar Four-Five Hemilaminectomy and Bilateral Lumbar Two-Three Laminectomy;  Surgeon: Eustace Moore, MD;  Location: [place];  Service: Neurosurgery;  Laterality: Right;  Right Lumbar Four-Five Hemilaminectomy and Bilateral Lumbar Two-Three Laminectomy  . LUMBAR SPINE SURGERY  2006  . NASAL SEPTUM SURGERY  09/01/13   Dr. Adriana Reams  . PACEMAKER INSERTION  10-16-2012   STJ Accent dual chamber pacemaker implanted by Dr [name] for symptomatic Mobitz II heart block  . PERMANENT PACEMAKER INSERTION N/A 10/16/2012   Procedure: PERMANENT PACEMAKER INSERTION;  Surgeon: Thompson Grayer, MD;  Location: [facility];  Service: Cardiovascular;  Laterality: N/A;  . RIGHT/LEFT HEART CATH AND CORONARY/GRAFT ANGIOGRAPHY N/A 05/20/2018   Procedure: RIGHT/LEFT  HEART CATH AND CORONARY/GRAFT ANGIOGRAPHY;  Surgeon: [name], Peter M, MD;  Location: [place] [facility];  Service: Cardiovascular;  Laterality: N/A;    Social History   Socioeconomic History  . Marital status: Widowed    Spouse name: Not on file  . Number of children: Not on file  . Years of education: Not on file  . Highest education level: Not on file  Occupational History  . Not on file  Tobacco Use  . Smoking status: Former Smoker    Packs/day: 2.00    Years: 40.00    Pack years: 80.00    Types: Cigarettes    Quit date: 04/02/1994    Years since quitting: 25.4  . Smokeless tobacco: Never Used  Vaping Use  . Vaping Use: Never used  Substance and Sexual Activity  . Alcohol use: Not Currently    Alcohol/week: 0.0 standard drinks  . Drug use: No  . Sexual activity: Not Currently  Other Topics Concern  . Not on file  Social History Narrative  . Not on file   Social Determinants of Health    Financial Resource Strain:   . Difficulty of Paying Living Expenses:   Food Insecurity:   . Worried About Charity fundraiser in the Last Year:   . Arboriculturist in the Last Year:   Transportation Needs:   . Film/video editor (Medical):   Marland Kitchen Lack of Transportation (Non-Medical):   Physical Activity:   . Days of Exercise per Week:   . Minutes of Exercise per Session:   Stress:   . Feeling of Stress :   Social Connections:   . Frequency of Communication with Friends and Family:   . Frequency of Social Gatherings with Friends and Family:   . Attends Religious Services:   . Active Member of Clubs or Organizations:   . Attends Archivist Meetings:   Marland Kitchen Marital Status:   Intimate Partner Violence:   . Fear of Current or Ex-Partner:   . Emotionally Abused:   Marland Kitchen Physically Abused:   . Sexually Abused:         Objective:    BP (!) 99/52   Pulse (!) 40   Temp (!) 97.1 F (36.2 C) (Temporal)   Ht _0  (1.727 m)   Wt 168 lb 9.6 oz (76.5 kg)   SpO2 95%   BMI 25.64 kg/m   Wt Readings from Last 3 Encounters:  09/10/19 168 lb 9.6 oz (76.5 kg)  09/07/19 166 lb 12.8 oz (75.7 kg)  08/11/19 170 lb 12.8 oz (77.5 kg)    Physical Exam Vitals reviewed.  Constitutional:      General: He is not in acute distress.    Appearance: Normal appearance. He is normal weight. He is not ill-appearing, toxic-appearing or diaphoretic.  HENT:     Head: Normocephalic and atraumatic.  Eyes:     General: No scleral icterus.       Right eye: No discharge.        Left eye: No discharge.     Conjunctiva/sclera: Conjunctivae normal.  Cardiovascular:     Rate and Rhythm: Regular rhythm. Bradycardia present.     Heart sounds: Normal heart sounds. No murmur heard.  No friction rub. No gallop.   Pulmonary:     Effort: Pulmonary effort is normal. No respiratory distress.     Breath sounds: Normal breath sounds. No stridor. No wheezing, rhonchi or rales.  Musculoskeletal:  General: Normal range of motion.     Cervical back: Normal range of motion.  Skin:    General: Skin is warm and dry.  Neurological:     Mental Status: He is alert and oriented to person, place, and time. Mental status is at baseline.  Psychiatric:        Mood and Affect: Mood normal.        Behavior: Behavior normal.        Thought Content: Thought content normal.        Judgment: Judgment normal.    Diabetic Foot Exam - Simple   Simple Foot Form Diabetic Foot exam was performed with the following findings: Yes 09/10/2019  8:36 AM  Visual Inspection No deformities, no ulcerations, no other skin breakdown bilaterally: Yes Sensation Testing Intact to touch and monofilament testing bilaterally: Yes Pulse Check Posterior Tibialis and Dorsalis pulse intact bilaterally: Yes Comments     Lab Results  Component Value Date   TSH 1.104 10/03/2012   Lab Results  Component Value Date   WBC 7.1 09/07/2019   HGB 15.5 09/07/2019   HCT 47.8 09/07/2019   MCV 101.1 (H) 09/07/2019   PLT 179 09/07/2019   Lab Results  Component Value Date   NA 140 09/07/2019   K 4.4 09/07/2019   CO2 26 09/07/2019   GLUCOSE 189 (H) 09/07/2019   BUN 50 (H) 09/07/2019   CREATININE 1.48 (H) 09/07/2019   BILITOT 0.7 01/12/2007   ALKPHOS 49 01/12/2007   AST 19 01/12/2007   ALT 20 01/12/2007   PROT 6.1 01/12/2007   ALBUMIN 3.7 01/12/2007   CALCIUM 9.1 09/07/2019   ANIONGAP 10 09/07/2019   Lab Results  Component Value Date   CHOL 145 05/05/2019   Lab Results  Component Value Date   HDL 28 (L) 05/05/2019   Lab Results  Component Value Date   LDLCALC 40 05/05/2019   Lab Results  Component Value Date   TRIG 385 (H) 05/05/2019   Lab Results  Component Value Date   CHOLHDL 5.2 05/05/2019   Lab Results  Component Value Date   HGBA1C 6.3 09/10/2019

## 2019-09-10 NOTE — Patient Instructions (Signed)

## 2019-09-10 NOTE — Telephone Encounter (Signed)
Pt saw Delight Ovens, FNP and HR was 40 - wanted to verify what device is set at since last HR at 56 CHF appt - will forward to device

## 2019-09-11 LAB — MICROALBUMIN / CREATININE URINE RATIO
Creatinine, Urine: 169 mg/dL
Microalb/Creat Ratio: 11 mg/g creat (ref 0–29)
Microalbumin, Urine: 18.6 ug/mL

## 2019-09-21 ENCOUNTER — Other Ambulatory Visit (HOSPITAL_COMMUNITY): Payer: Medicare Other

## 2019-09-30 ENCOUNTER — Other Ambulatory Visit: Payer: Self-pay | Admitting: Internal Medicine

## 2019-10-02 ENCOUNTER — Ambulatory Visit: Payer: Medicare Other | Admitting: Cardiovascular Disease

## 2019-10-18 NOTE — Progress Notes (Deleted)
Cardiology Office Note  Date: 10/18/2019   ID: Ricardo Hunt, DOB October 17, 1940, MRN [id number]  PCP:  Loman Brooklyn, FNP  Cardiologist:  Kate Sable, MD Electrophysiologist:  None   Chief Complaint: f/u CAD, HTN, HLD, CHB, ICM, s/p BIV pacemaker upgrade , A Fib, HFrEF  History of Present Illness: Ricardo ABDIRIZAK Hunt is a 79 y.o. male with a history of CAD (CABG x 3 1996), ICM, HLD, HTN, DM, CHB with PPM and Recent BIV upgrade, thrombocytopenia, HFrEF, AS, atrial fibrillation (persistent).    Status post PCI with DES to left circumflex as well as orbital atherectomy and drug-eluting stent placement to LAD on 05/23/2018. Evaluated by advanced heart failure team with echo LVEF of 20 to 25% with severe LV dilation and diffuse hypokinesis with mild to moderate AS.   Previous Pacemaker with CRT-P.  (BIV) upgrade 11/25/2018.  Sees Dr. [name]  Recent Repeat Echo 06/08/2019; showing LVEF 25% with global hypokinesis, grade 1 DD, trivial MR, mild to moderate aortic valve sclerosis/calcification  Patient follows at heart failure clinic with Dr. Aundra Dubin and [place].  He had a visit with Bernerd Pho, [place] in May 01, 2019 complaining of worsening shortness of breath.  According to her note he self discontinued Entresto and Xarelto due to cost and had not taken either of them in several months.  At a follow-up with Dr. Aundra Dubin on May 05, 2019 he was having class II symptoms.  He was restarted on his Entresto 24/26 twice daily.  His digoxin, spironolactone, Coreg, were continued.  An echo was ordered.  Patient states he is actually feeling better since having the biventricular pacemaker placed.  He is now back on his Entresto 24/26 mg p.o. twice daily.  He states the heart failure clinic lowered his Lasix to 40 mg daily. SBP's are low on the low side but he is asymptomatic.  He denies any orthostatic symptoms, presyncopal or presyncopal symptoms.  He states he will follow up soon with the heart  failure clinic.  Advised him to weigh himself daily and adhere to fluid restrictions.  Reinforced the fact that he needs to call the heart failure clinic with weight gain of 3 pounds in 1 day or 5 pounds in 1 week.  He recently discovered he is diabetic and  started on insulin.  States his sugars have improved.  States this morning his blood sugar was 125.  He appears to be tolerating the [facility] well.  Past Medical History:  Diagnosis Date   Arthritis    " all over the body"   Atrial fibrillation and flutter ([place])    seen on PPM interrogation; Rivaroxaban started 10/6158   Chronic systolic CHF (congestive heart failure) ([place])    Echo 05/2018: EF 20-25, severe LVE, diffuse HK with septal and apical akinesis, moderate LAE, moderate MAC, severe aortic valve calcification - degree of AS diff to judge with low EF (likely mild to mod)   Coronary artery disease    s/p CABG 1996 // [place] 05/2018: oLAD 90, pLAD 100 after Dx, LCx 90 at bifurcation of OM1/OM2, mRCA 50; L-LAD ok; S-D1/OM2 100 >> PCI: DES to mLCx and orbital atherectomy and DES to oLAD   Diabetes mellitus without complication (HCC)    GERD (gastroesophageal reflux disease)    Hyperlipidemia    Hypertension    Mobitz (type) II atrioventricular block    s/p STJ Accent pacemaker implanted by Dr [name] 10-16-2012   Myocardial infarction [facility])    per pt.,  told that that the stress test shows that there is a weakening evident on the stress test   Pacemaker 10/16/2012   St. Jude    Personal history of colonic polyps-tubular adenomas 08/31/2013   Thrombocytopenia ([place]) 10/03/2012   Vertigo     Past Surgical History:  Procedure Laterality Date   BIV UPGRADE N/A 11/25/2018   Procedure: BIV UPGRADE;  Surgeon: Thompson Grayer, MD;  Location: [place] [facility];  Service: Cardiovascular;  Laterality: N/A;   cardiac bypass  1996   3 vessels   CATARACT EXTRACTION W/ INTRAOCULAR LENS  IMPLANT, BILATERAL Bilateral 2010   COLONOSCOPY      CORONARY ARTERY BYPASS GRAFT     CORONARY ATHERECTOMY N/A 05/23/2018   Procedure: CORONARY ATHERECTOMY;  Surgeon: [name], Peter M, MD;  Location: [place] [facility];  Service: Cardiovascular;  Laterality: N/A;   CORONARY STENT INTERVENTION N/A 05/23/2018   Procedure: CORONARY STENT INTERVENTION;  Surgeon: [name], Peter M, MD;  Location: [place] [facility];  Service: Cardiovascular;  Laterality: N/A;   INGUINAL HERNIA REPAIR Left 2010   LUMBAR LAMINECTOMY/DECOMPRESSION MICRODISCECTOMY Right 07/19/2016   Procedure: Right Lumbar Four-Five Hemilaminectomy and Bilateral Lumbar Two-Three Laminectomy;  Surgeon: Eustace Moore, MD;  Location: [facility];  Service: Neurosurgery;  Laterality: Right;  Right Lumbar Four-Five Hemilaminectomy and Bilateral Lumbar Two-Three Laminectomy   LUMBAR SPINE SURGERY  2006   NASAL SEPTUM SURGERY  09/01/13   Dr. Adriana Reams   PACEMAKER INSERTION  10-16-2012   STJ Accent dual chamber pacemaker implanted by Dr [name] for symptomatic Mobitz II heart block   PERMANENT PACEMAKER INSERTION N/A 10/16/2012   Procedure: PERMANENT PACEMAKER INSERTION;  Surgeon: Thompson Grayer, MD;  Location: [facility] CATH LAB;  Service: Cardiovascular;  Laterality: N/A;   RIGHT/LEFT HEART CATH AND CORONARY/GRAFT ANGIOGRAPHY N/A 05/20/2018   Procedure: RIGHT/LEFT HEART CATH AND CORONARY/GRAFT ANGIOGRAPHY;  Surgeon: [name], Peter M, MD;  Location: [place] [facility];  Service: Cardiovascular;  Laterality: N/A;    Current Outpatient Medications  Medication Sig Dispense Refill   ACCU-CHEK GUIDE test strip CHECK BS FOUR TIMES A DAY BEFORE MEALS AT BEDTIME Dx E11.65 100 each 12   Accu-Chek Softclix Lancets lancets CHECK BS FOUR TIMES A DAY BEFORE MEALS AT BEDTIME Dx E11.65 100 each 12   carvedilol (COREG) 6.25 MG tablet Take 1 tablet (6.25 mg total) by mouth 2 (two) times daily with a meal. 180 tablet 2   digoxin (LANOXIN) 0.125 MG tablet Take 0.5 tablets (0.0625 mg total) by mouth daily. 45 tablet 2    ENTRESTO 24-26 MG TAKE  (1)  TABLET TWICE A DAY. 180 tablet 3   furosemide (LASIX) 40 MG tablet Take 1 tablet (40 mg total) by mouth daily. 90 tablet 3   insulin glargine (LANTUS) 100 UNIT/ML injection Inject 17 Units into the skin at bedtime.     insulin lispro (HUMALOG) 100 UNIT/ML injection Inject 0.02-0.1 mLs (2-10 Units total) into the skin 4 (four) times daily -  before meals and at bedtime. 10 mL 2   Insulin Pen Needle (TRUEPLUS PEN NEEDLES) 31G X 5 MM MISC Use five times daily with insulin Dx E11.65 500 each 3   rivaroxaban (XARELTO) 20 MG TABS tablet Take 1 tablet (20 mg total) by mouth daily with supper. 30 tablet 5   rosuvastatin (CRESTOR) 20 MG tablet Take 1 tablet (20 mg total) by mouth daily at 6 PM. 90 tablet 2   spironolactone (ALDACTONE) 25 MG tablet Take 0.5 tablets (12.5 mg  total) by mouth at bedtime. 45 tablet 1   No current facility-administered medications for this visit.   Allergies:  Patient has no known allergies.   Social History: The patient  reports that he quit smoking about 25 years ago. His smoking use included cigarettes. He has a 80.00 pack-year smoking history. He has never used smokeless tobacco. He reports previous alcohol use. He reports that he does not use drugs.   Family History: The patient's family history includes Breast cancer in his sister and another family member; CAD in his father and mother; Diabetes in his mother; Hypertension in his daughter, father, and son.   ROS:  Please see the history of present illness. Otherwise, complete review of systems is positive for none.  All other systems are reviewed and negative.   Physical Exam: VS:  There were no vitals taken for this visit., BMI There is no height or weight on file to calculate BMI.  Wt Readings from Last 3 Encounters:  09/10/19 168 lb 9.6 oz (76.5 kg)  09/07/19 166 lb 12.8 oz (75.7 kg)  08/11/19 170 lb 12.8 oz (77.5 kg)    General: Patient appears comfortable at rest. Neck:  Supple, no elevated JVP or carotid bruits, no thyromegaly. Lungs: Clear to auscultation, nonlabored breathing at rest. Cardiac: Regular rate and rhythm, no S3 or significant systolic murmur, no pericardial rub. Extremities: No pitting edema, distal pulses 2+. Skin: Warm and dry. Musculoskeletal: No kyphosis. Neuropsychiatric: Alert and oriented x3, affect grossly appropriate.  ECG: None today  Recent Labwork: 11/16/2018: B Natriuretic Peptide 783.8 11/19/2018: Magnesium 2.2 09/07/2019: BUN 50; Creatinine, Ser 1.48; Hemoglobin 15.5; Platelets 179; Potassium 4.4; Sodium 140     Component Value Date/Time   CHOL 145 05/05/2019 1642   TRIG 385 (H) 05/05/2019 1642   HDL 28 (L) 05/05/2019 1642   CHOLHDL 5.2 05/05/2019 1642   VLDL 77 (H) 05/05/2019 1642   LDLCALC 40 05/05/2019 1642    Other Studies Reviewed Today:  Echocardiogram 06/08/2019 1. Left ventricular ejection fraction, by estimation, is 25%. The left ventricle has severely decreased function. The left ventricle demonstrates global hypokinesis. Left ventricular diastolic parameters are consistent with Grade I diastolic dysfunction (impaired relaxation). 2. Right ventricular systolic function is mildly reduced. The right ventricular size is normal. Tricuspid regurgitation signal is inadequate for assessing PA pressure. 3. Left atrial size was mildly dilated. 4. The mitral valve is normal in structure. Trivial mitral valve regurgitation. No evidence of mitral stenosis. 5. The aortic valve is tricuspid. Aortic valve regurgitation is not visualized. Mild to moderate aortic valve sclerosis/calcification is present, without any evidence of aortic stenosis. 6. The inferior vena cava is normal in size with greater than 50% respiratory variability, suggesting right atrial pressure of 3 mmHg.  Cardiac Catheterization: 05/2018  Prox RCA lesion is 40% stenosed.  Mid RCA lesion is 50% stenosed.  Ost LM to Mid LM lesion is 30%  stenosed.  Dist LM to Ost LAD lesion is 90% stenosed.  Prox LAD lesion is 100% stenosed.  Prox Cx lesion is 90% stenosed.  Origin to Prox Graft lesion before Ost 1st Diag is 100% stenosed.  LIMA graft was visualized by angiography and is normal in caliber.  The graft exhibits no disease.  There is severe left ventricular systolic dysfunction.  LV end diastolic pressure is moderately elevated.  The left ventricular ejection fraction is less than 25% by visual estimate.  There is no aortic valve stenosis.  Hemodynamic findings consistent with mild pulmonary hypertension.  1. Severe 2 vessel obstructive CAD.  - 90% ostial LAD supplying a large first diagonal - 100% LAD after the first diagonal. - 90% LCx at bifurcation of OM1 and OM2 - 50% mid RCA 2. Patent LIMA to the LAD 3. Occluded sequential SVG to the first diagonal and OM2 4. Severe LV dysfunction 5. Mild pulmonary venous HTN 6. Elevated LV filling pressures 7. Cardiac index 2.34  Plan: will admit to telemetry. Consult advanced heart failure team to optimize CHF therapy. Will load with Plavix. Anticipate complex PCI later this week including stenting of the LCx and atherectomy and stenting of the left main/ostial LAD.   Coronary Stent Intervention: 05/2018  Ost LM to Mid LM lesion is 30% stenosed.  Dist LM to Ost LAD lesion is 90% stenosed.  A drug-eluting stent was successfully placed using a STENT RESOLUTE ONYX 2.5X26.  Post intervention, there is a 0% residual stenosis.  Prox Cx lesion is 90% stenosed.  Post intervention, there is a 0% residual stenosis.  A drug-eluting stent was successfully placed using a STENT RESOLUTE ONYX 2.5X15.  1. Successful PCI of the proximal to mid LCx with DES x 1 2. Successful PCI of the ostial LAD with orbital atherectomy and DES x 1.   Plan: DAPT with ASA and Plavix. Continue ASA for one month then discontinue. Continue Plavix for one year. May resume  concomitant DOAC therapy tomorrow if stable. Further management per Advanced heart failure team  Echocardiogram: 11/2018 IMPRESSIONS    1. The left ventricle has a visually estimated ejection fraction of 20%.  The cavity size was moderately dilated. There is mildly increased left  ventricular wall thickness. Left ventricular diastolic Doppler parameters  are consistent with impaired  relaxation.  2. There is akinesis of the left ventricular, entire inferior wall.  3. There is akinesis of the left ventricular, mid-apical anteroseptal  wall and apical segment.  4. Left atrial size was mildly dilated.  5. The aortic valve is tricuspid. Mild calcification of the aortic valve.  Mild-moderate stenosis of the aortic valve. Moderate aortic annular  calcification noted.  6. The mitral valve is degenerative. Mild calcification of the mitral  valve leaflet. There is moderate mitral annular calcification present.  7. The tricuspid valve is grossly normal.  8. The aorta is abnormal unless otherwise noted.  9. There is mild dilatation of the aortic root.  Assessment and Plan:  No diagnosis found. 1. CAD in native artery Remote CABG followed by stent placement x 04 May 2018 ; PCI to proximal to mid LCx with DES x1, and PCI of ostial LAD with orbital atherectomy and DES x1.  He denies any progressive anginal or exertional symptoms.  States he does have some mild fatigue.  2. Chronic systolic CHF (congestive heart failure) ([place]) At a previous visit with Bernerd Pho PA 05/01/2019 he c/o of worsening dyspnea. He admitted he did self-discontinue Entresto and Xarelto due to cost. Stated he had not been on both in several months.  Patient has restarted his Entresto 24/26 mg p.o. twice daily.  He continues taking Lasix at a reduced dose of 40 mg daily.  He follows at heart failure clinic with Dr. Aundra Dubin and [place].  3. Complete heart block ([place])  S/P BIV PM upgrade 11/25/2018 Was RV  pacing 92% by previous device interrogation. Now s/p CRT-P upgrade 11/25/2018 ECG showed V paced at 68 bpm with QRS 178 ms (compared to 194 ms pre-op/11/24/18) patient states since BiV pacemaker installed he is feeling  much better than previously with only occasional mild fatigue.  4. Essential hypertension  Blood pressure is 98/68 clinic today.  Patient states at home his systolic blood pressures have been running anywhere from 100-104.  He denies any orthostatic symptoms/presyncope/syncopal episodes.  5. Hyperlipidemia LDL goal <70 Recent lipid panel on 05/05/2019 showed total cholesterol 145, triglycerides 385, HDL 28, LDL 40.  Continue Crestor 20 mg daily  6. Persistent atrial fibrillation ([place]) CHADSVASC = 5 .  EKG  On 06/08/2019 showed atrial sensed, V paced rhythm rate of 75 with biventricular pacemaker no change since last tracing.  Not on anticoagulation.  No underlying atrial fibrillation noted on EKG.  7. New Onset DM Patient had been complaining recently of weight loss, thirst, frequent urination.  DM type II was discovered.  He is on short and long-acting insulin for management.  States his a.m. blood sugar this morning was 125.  This may be or have been contributing to lower blood pressures secondary to osmotic diuresis in the past.  Medication Adjustments/Labs and Tests Ordered: Current medicines are reviewed at length with the patient today.  Concerns regarding medicines are outlined above.   Disposition: Follow-up with Dr Bronson Ing or APP  Signed, Levell July, NP 10/18/2019 10:23 PM    [facility] [place], [place], [place] [address] Phone: [phone]; Fax: [phone]

## 2019-10-20 ENCOUNTER — Ambulatory Visit: Payer: Medicare Other | Admitting: Family Medicine

## 2019-11-11 ENCOUNTER — Telehealth: Payer: Self-pay | Admitting: *Deleted

## 2019-11-11 MED ORDER — INSULIN GLARGINE 100 UNIT/ML ~~LOC~~ SOLN: 17.0000 [IU] | Freq: Every evening | SUBCUTANEOUS | 2 refills | Status: DC

## 2019-11-11 NOTE — Telephone Encounter (Signed)
Fax from [facility] request for Lantus Solostar 100u 15u QHS Historical med. Last OV 09/10/19 Next OV not sched

## 2019-11-11 NOTE — Addendum Note (Signed)
Addended by: Loman Brooklyn on: 11/11/2019 05:02 PM   Modules accepted: Orders

## 2019-11-12 ENCOUNTER — Telehealth: Payer: Self-pay | Admitting: Family Medicine

## 2019-11-12 MED ORDER — LANTUS SOLOSTAR 100 UNIT/ML ~~LOC~~ SOPN
17.0000 [IU] | PEN_INJECTOR | Freq: Every day | SUBCUTANEOUS | 1 refills | Status: DC
Start: 2019-11-12 — End: 2020-03-23

## 2019-11-12 NOTE — Telephone Encounter (Signed)
°  Prescription Request  11/12/2019  What is the name of the medication or equipment? lantes solastar pen. Pharmacy said wrong thing was called in.  Have you contacted your pharmacy to request a refill? (if applicable) yes  Which pharmacy would you like this sent to? [facility]   Patient notified that their request is being sent to the clinical staff for review and that they should receive a response within 2 business days.

## 2019-11-12 NOTE — Telephone Encounter (Signed)
Medication resent

## 2019-11-24 ENCOUNTER — Ambulatory Visit (INDEPENDENT_AMBULATORY_CARE_PROVIDER_SITE_OTHER): Payer: Medicare Other | Admitting: *Deleted

## 2019-11-24 DIAGNOSIS — I442 Atrioventricular block, complete: Secondary | ICD-10-CM

## 2019-11-24 LAB — CUP PACEART REMOTE DEVICE CHECK
Battery Remaining Longevity: 106 mo
Battery Remaining Percentage: 95.5 %
Battery Voltage: 3.01 V
Brady Statistic AP VP Percent: 31 %
Brady Statistic AP VS Percent: 1 %
Brady Statistic AS VP Percent: 61 %
Brady Statistic AS VS Percent: 1.3 %
Brady Statistic RA Percent Paced: 24 %
Date Time Interrogation Session: 20210824020009
Implantable Lead Implant Date: 20140717
Implantable Lead Implant Date: 20140717
Implantable Lead Implant Date: 20200825
Implantable Lead Location: 753858
Implantable Lead Location: 753859
Implantable Lead Location: 753860
Implantable Lead Model: 1948
Implantable Pulse Generator Implant Date: 20200825
Lead Channel Impedance Value: 1025 Ohm
Lead Channel Impedance Value: 410 Ohm
Lead Channel Impedance Value: 590 Ohm
Lead Channel Pacing Threshold Amplitude: 0.625 V
Lead Channel Pacing Threshold Amplitude: 1 V
Lead Channel Pacing Threshold Amplitude: 1 V
Lead Channel Pacing Threshold Pulse Width: 0.4 ms
Lead Channel Pacing Threshold Pulse Width: 0.4 ms
Lead Channel Pacing Threshold Pulse Width: 0.5 ms
Lead Channel Sensing Intrinsic Amplitude: 12 mV
Lead Channel Sensing Intrinsic Amplitude: 3.5 mV
Lead Channel Setting Pacing Amplitude: 2 V
Lead Channel Setting Pacing Amplitude: 2.25 V
Lead Channel Setting Pacing Amplitude: 2.5 V
Lead Channel Setting Pacing Pulse Width: 0.4 ms
Lead Channel Setting Pacing Pulse Width: 0.5 ms
Lead Channel Setting Sensing Sensitivity: 4 mV
Pulse Gen Model: 3562
Pulse Gen Serial Number: 9158588

## 2019-11-30 NOTE — Progress Notes (Signed)
Remote pacemaker transmission.   

## 2019-12-25 ENCOUNTER — Encounter (HOSPITAL_COMMUNITY): Payer: Medicare Other | Admitting: Cardiology

## 2020-01-13 NOTE — Progress Notes (Signed)
Cardiology Office Note  Date: 01/14/2020   ID: [name], DOB 02-Jul-1940, MRN [id number]  PCP:  Loman Brooklyn, FNP  Cardiologist:  No primary care provider on file. Electrophysiologist:  None   Chief Complaint: f/u CAD, HTN, HLD, CHB, ICM, s/p BIV pacemaker upgrade , A Fib, HFrEF  History of Present Illness: Ricardo Hunt is a 79 y.o. male with a history of CAD (CABG x 3 1996), ICM, HLD, HTN, DM, CHB with PPM and Recent BIV upgrade, thrombocytopenia, HFrEF, AS, atrial fibrillation (persistent).    Status post PCI with DES to left circumflex as well as orbital atherectomy and drug-eluting stent placement to LAD on 05/23/2018. Evaluated by advanced heart failure team with echo LVEF of 20 to 25% with severe LV dilation and diffuse hypokinesis with mild to moderate AS.   Previous Pacemaker with CRT-P.  (BIV) upgrade 11/25/2018.  Sees Dr. [name]  Recent Repeat Echo 06/08/2019; showing LVEF 25% with global hypokinesis, grade 1 DD, trivial MR, mild to moderate aortic valve sclerosis/calcification  Patient follows at heart failure clinic with Dr. Aundra Dubin and [place].  He had a visit with Bernerd Pho, [place] in May 01, 2019 complaining of worsening shortness of breath.  According to her note he self discontinued Entresto and Xarelto due to cost and had not taken either of them in several months.  At a follow-up with Dr. Aundra Dubin on May 05, 2019 he was having class II symptoms.  He was restarted on his Entresto 24/26 twice daily.  His digoxin, spironolactone, Coreg, were continued.  An echo was ordered.  At last visit patient stated he was actually feeling better since having the biventricular pacemaker placed. Continuing his Entresto 24/26 mg p.o. twice daily.  He appears euvolemic although his weight looks like his has increased approximately 10 pounds since last weight check.  He states he knows he has recently not been watching what he eats.  States he recently went to [facility]  and probably ate more than he should.  He denies any dyspnea on exertion or lower extremity edema.  Advised him to continue to weigh himself daily and adhere to fluid restrictions.  Reinforced the fact that he needs to call the heart failure clinic with weight gain of 3 pounds in 1 day or 5 pounds in 1 week.  It appears at last visit with Dr. Benjamine Mola on 09/07/2019 he was to continue Lasix 40 every morning and 20 p.m.  He admits he sometimes does not take the Lasix if he has to go out of the house due to needing to go to the bathroom frequently to urinate.  Past Medical History:  Diagnosis Date  . Arthritis    " all over the body"  . Atrial fibrillation and flutter ([place])    seen on PPM interrogation; Rivaroxaban started 04/2018  . Chronic systolic CHF (congestive heart failure) ([place])    Echo 05/2018: EF 20-25, severe LVE, diffuse HK with septal and apical akinesis, moderate LAE, moderate MAC, severe aortic valve calcification - degree of AS diff to judge with low EF (likely mild to mod)  . Coronary artery disease    s/p CABG 1996 // LHC 05/2018: oLAD 90, pLAD 100 after Dx, LCx 90 at bifurcation of OM1/OM2, mRCA 50; L-LAD ok; S-D1/OM2 100 >> PCI: DES to mLCx and orbital atherectomy and DES to oLAD  . Diabetes mellitus without complication ([place])   . GERD (gastroesophageal reflux disease)   . Hyperlipidemia   . Hypertension   .  Mobitz (type) II atrioventricular block    s/p STJ Accent pacemaker implanted by Dr [name] 10-16-2012  . Myocardial infarction [facility])    per pt., told that that the stress test shows that there is a weakening evident on the stress test  . Pacemaker 10/16/2012   St. Jude   . Personal history of colonic polyps-tubular adenomas 08/31/2013  . Thrombocytopenia ([place]) 10/03/2012  . Vertigo     Past Surgical History:  Procedure Laterality Date  . BIV UPGRADE N/A 11/25/2018   Procedure: BIV UPGRADE;  Surgeon: Thompson Grayer, MD;  Location: [facility];  Service: Cardiovascular;   Laterality: N/A;  . cardiac bypass  1996   3 vessels  . CATARACT EXTRACTION W/ INTRAOCULAR LENS  IMPLANT, BILATERAL Bilateral 2010  . COLONOSCOPY    . CORONARY ARTERY BYPASS GRAFT    . CORONARY ATHERECTOMY N/A 05/23/2018   Procedure: CORONARY ATHERECTOMY;  Surgeon: [name], Peter M, MD;  Location: [place];  Service: Cardiovascular;  Laterality: N/A;  . CORONARY STENT INTERVENTION N/A 05/23/2018   Procedure: CORONARY STENT INTERVENTION;  Surgeon: [name], Peter M, MD;  Location: [facility];  Service: Cardiovascular;  Laterality: N/A;  . INGUINAL HERNIA REPAIR Left 2010  . LUMBAR LAMINECTOMY/DECOMPRESSION MICRODISCECTOMY Right 07/19/2016   Procedure: Right Lumbar Four-Five Hemilaminectomy and Bilateral Lumbar Two-Three Laminectomy;  Surgeon: Eustace Moore, MD;  Location: [facility];  Service: Neurosurgery;  Laterality: Right;  Right Lumbar Four-Five Hemilaminectomy and Bilateral Lumbar Two-Three Laminectomy  . LUMBAR SPINE SURGERY  2006  . NASAL SEPTUM SURGERY  09/01/13   Dr. Adriana Reams  . PACEMAKER INSERTION  10-16-2012   STJ Accent dual chamber pacemaker implanted by Dr [name] for symptomatic Mobitz II heart block  . PERMANENT PACEMAKER INSERTION N/A 10/16/2012   Procedure: PERMANENT PACEMAKER INSERTION;  Surgeon: Thompson Grayer, MD;  Location: [facility] C[facility] LAB;  Service: Cardiovascular;  Laterality: N/A;  . RIGHT/LEFT HEART CATH AND CORONARY/GRAFT ANGIOGRAPHY N/A 05/20/2018   Procedure: RIGHT/LEFT HEART CATH AND CORONARY/GRAFT ANGIOGRAPHY;  Surgeon: [name], Peter M, MD;  Location: [facility];  Service: Cardiovascular;  Laterality: N/A;    Current Outpatient Medications  Medication Sig Dispense Refill  . ACCU-CHEK GUIDE test strip CHECK BS FOUR TIMES A DAY BEFORE MEALS AT BEDTIME Dx E11.65 100 each 12  . Accu-Chek Softclix Lancets lancets CHECK BS FOUR TIMES A DAY BEFORE MEALS AT BEDTIME Dx E11.65 100 each 12  . carvedilol (COREG) 6.25 MG tablet Take 1 tablet (6.25 mg total) by  mouth 2 (two) times daily with a meal. 180 tablet 2  . digoxin (LANOXIN) 0.125 MG tablet Take 0.5 tablets (0.0625 mg total) by mouth daily. 45 tablet 2  . ENTRESTO 24-26 MG TAKE  (1)  TABLET TWICE A DAY. 180 tablet 3  . furosemide (LASIX) 40 MG tablet Take 1 tablet (40 mg total) by mouth daily. 90 tablet 3  . insulin glargine (LANTUS SOLOSTAR) 100 UNIT/ML Solostar Pen Inject 17 Units into the skin daily. 15 mL 1  . insulin lispro (HUMALOG) 100 UNIT/ML injection Inject 0.02-0.1 mLs (2-10 Units total) into the skin 4 (four) times daily -  before meals and at bedtime. 10 mL 2  . Insulin Pen Needle (TRUEPLUS PEN NEEDLES) 31G X 5 MM MISC Use five times daily with insulin Dx E11.65 500 each 3  . rivaroxaban (XARELTO) 20 MG TABS tablet Take 1 tablet (20 mg total) by mouth daily with supper. 30 tablet 5  . rosuvastatin (CRESTOR) 20  MG tablet Take 1 tablet (20 mg total) by mouth daily at 6 PM. 90 tablet 2  . spironolactone (ALDACTONE) 25 MG tablet Take 0.5 tablets (12.5 mg total) by mouth at bedtime. 45 tablet 1   No current facility-administered medications for this visit.   Allergies:  Patient has no known allergies.   Social History: The patient  reports that he quit smoking about 25 years ago. His smoking use included cigarettes. He has a 80.00 pack-year smoking history. He has never used smokeless tobacco. He reports previous alcohol use. He reports that he does not use drugs.   Family History: The patient's family history includes Breast cancer in his sister and another family member; CAD in his father and mother; Diabetes in his mother; Hypertension in his daughter, father, and son.   ROS:  Please see the history of present illness. Otherwise, complete review of systems is positive for none.  All other systems are reviewed and negative.   Physical Exam: VS:  BP 122/68   Pulse 63   Ht _0  (1.727 m)   Wt 176 lb 3.2 oz (79.9 kg)   SpO2 97%   BMI 26.79 kg/m , BMI Body mass index is 26.79  kg/m.  Wt Readings from Last 3 Encounters:  01/14/20 176 lb 3.2 oz (79.9 kg)  09/10/19 168 lb 9.6 oz (76.5 kg)  09/07/19 166 lb 12.8 oz (75.7 kg)    General: Patient appears comfortable at rest. Neck: Supple, no elevated JVP or carotid bruits, no thyromegaly. Lungs: Clear to auscultation, nonlabored breathing at rest. Cardiac: Regular rate and rhythm, no S3 or significant systolic murmur, no pericardial rub. Extremities: No pitting edema, distal pulses 2+. Skin: Warm and dry. Musculoskeletal: No kyphosis. Neuropsychiatric: Alert and oriented x3, affect grossly appropriate.  ECG: None today  Recent Labwork: 09/07/2019: BUN 50; Creatinine, Ser 1.48; Hemoglobin 15.5; Platelets 179; Potassium 4.4; Sodium 140     Component Value Date/Time   CHOL 145 05/05/2019 1642   TRIG 385 (H) 05/05/2019 1642   HDL 28 (L) 05/05/2019 1642   CHOLHDL 5.2 05/05/2019 1642   VLDL 77 (H) 05/05/2019 1642   LDLCALC 40 05/05/2019 1642    Other Studies Reviewed Today:  Echocardiogram 06/08/2019 1. Left ventricular ejection fraction, by estimation, is 25%. The left ventricle has severely decreased function. The left ventricle demonstrates global hypokinesis. Left ventricular diastolic parameters are consistent with Grade I diastolic dysfunction (impaired relaxation). 2. Right ventricular systolic function is mildly reduced. The right ventricular size is normal. Tricuspid regurgitation signal is inadequate for assessing PA pressure. 3. Left atrial size was mildly dilated. 4. The mitral valve is normal in structure. Trivial mitral valve regurgitation. No evidence of mitral stenosis. 5. The aortic valve is tricuspid. Aortic valve regurgitation is not visualized. Mild to moderate aortic valve sclerosis/calcification is present, without any evidence of aortic stenosis. 6. The inferior vena cava is normal in size with greater than 50% respiratory variability, suggesting right atrial pressure of 3  mmHg.  Cardiac Catheterization: 05/2018  Prox RCA lesion is 40% stenosed.  Mid RCA lesion is 50% stenosed.  Ost LM to Mid LM lesion is 30% stenosed.  Dist LM to Ost LAD lesion is 90% stenosed.  Prox LAD lesion is 100% stenosed.  Prox Cx lesion is 90% stenosed.  Origin to Prox Graft lesion before Ost 1st Diag is 100% stenosed.  LIMA graft was visualized by angiography and is normal in caliber.  The graft exhibits no disease.  There is severe  left ventricular systolic dysfunction.  LV end diastolic pressure is moderately elevated.  The left ventricular ejection fraction is less than 25% by visual estimate.  There is no aortic valve stenosis.  Hemodynamic findings consistent with mild pulmonary hypertension.  1. Severe 2 vessel obstructive CAD.  - 90% ostial LAD supplying a large first diagonal - 100% LAD after the first diagonal. - 90% LCx at bifurcation of OM1 and OM2 - 50% mid RCA 2. Patent LIMA to the LAD 3. Occluded sequential SVG to the first diagonal and OM2 4. Severe LV dysfunction 5. Mild pulmonary venous HTN 6. Elevated LV filling pressures 7. Cardiac index 2.34  Plan: will admit to telemetry. Consult advanced heart failure team to optimize CHF therapy. Will load with Plavix. Anticipate complex PCI later this week including stenting of the LCx and atherectomy and stenting of the left main/ostial LAD.   Coronary Stent Intervention: 05/2018  Ost LM to Mid LM lesion is 30% stenosed.  Dist LM to Ost LAD lesion is 90% stenosed.  A drug-eluting stent was successfully placed using a STENT RESOLUTE ONYX 2.5X26.  Post intervention, there is a 0% residual stenosis.  Prox Cx lesion is 90% stenosed.  Post intervention, there is a 0% residual stenosis.  A drug-eluting stent was successfully placed using a STENT RESOLUTE ONYX 2.5X15.  1. Successful PCI of the proximal to mid LCx with DES x 1 2. Successful PCI of the ostial LAD with orbital  atherectomy and DES x 1.   Plan: DAPT with ASA and Plavix. Continue ASA for one month then discontinue. Continue Plavix for one year. May resume concomitant DOAC therapy tomorrow if stable. Further management per Advanced heart failure team  Echocardiogram: 11/2018 IMPRESSIONS    1. The left ventricle has a visually estimated ejection fraction of 20%.  The cavity size was moderately dilated. There is mildly increased left  ventricular wall thickness. Left ventricular diastolic Doppler parameters  are consistent with impaired  relaxation.  2. There is akinesis of the left ventricular, entire inferior wall.  3. There is akinesis of the left ventricular, mid-apical anteroseptal  wall and apical segment.  4. Left atrial size was mildly dilated.  5. The aortic valve is tricuspid. Mild calcification of the aortic valve.  Mild-moderate stenosis of the aortic valve. Moderate aortic annular  calcification noted.  6. The mitral valve is degenerative. Mild calcification of the mitral  valve leaflet. There is moderate mitral annular calcification present.  7. The tricuspid valve is grossly normal.  8. The aorta is abnormal unless otherwise noted.  9. There is mild dilatation of the aortic root.  Assessment and Plan:   1. CAD in native artery Remote CABG followed by stent placement x 04 May 2018 ; PCI to proximal to mid LCx with DES x1, and PCI of ostial LAD with orbital atherectomy and DES x1.  He denies any progressive anginal or exertional symptoms.  States he does have some mild fatigue.  2. Chronic systolic CHF (congestive heart failure) He appears euvolemic today without shortness of breath or lower extremity edema.  He does have some weight gain of about 10 pounds based on his last measurement in June.  States he has been to [facility] recently and has not been eating right.  Also states he has missed some doses of his Lasix.  Advised him how important it was to take Lasix  and other heart failure medications as directed.  Advised him the importance of fluid restrictions, sodium restrictions,  and weighing herself daily.  Continue Entresto 24/26 mg p.o. twice daily.  He continues taking Lasix at a reduced dose of 40 mg daily.  Has a follow-up with Dr. Aundra Dubin on February 15, 2020   3. Complete heart block (HCC)  S/P BIV PM upgrade 11/25/2018 Was RV pacing 92% by previous device interrogation. Now s/p CRT-P upgrade 11/25/2018.  ECG showed V paced at 68 bpm with QRS 178 ms (compared to 194 ms pre-op/11/24/18) patient states since BiV pacemaker installed he is feeling much better than previously with only occasional mild fatigue.  4. Essential hypertension  Blood pressure 122/68 today.  Continue carvedilol 6.25 mg p.o. twice daily.Marland Kitchen  He denies any orthostatic symptoms/presyncope/syncopal episodes.  5. Hyperlipidemia LDL goal <70 Recent lipid panel on 05/05/2019 showed total cholesterol 145, triglycerides 385, HDL 28, LDL 40.  Continue Crestor 20 mg daily  6. Persistent atrial fibrillation ([place]) CHADSVASC = 5 .  EKG  On 06/08/2019 showed atrial sensed, V paced rhythm rate of 75 with biventricular pacemaker no change since last tracing.  Not on anticoagulation.   7. New Onset DM Patient states his blood sugars can fluctuate.  States this morning it was around 120 but has been higher than that.  He is on short and long-acting insulin for management.  Follow with PCP for management        Medication Adjustments/Labs and Tests Ordered: Current medicines are reviewed at length with the patient today.  Concerns regarding medicines are outlined above.   Disposition: Follow-up with Dr. Domenic Polite or APP 6 months   Signed, Levell July, NP 01/14/2020 12:29 PM    [facility] [place], [place], [place] [address] Phone: [phone]; Fax: [phone]

## 2020-01-14 ENCOUNTER — Ambulatory Visit (INDEPENDENT_AMBULATORY_CARE_PROVIDER_SITE_OTHER): Payer: Medicare Other | Admitting: Family Medicine

## 2020-01-14 ENCOUNTER — Other Ambulatory Visit: Payer: Self-pay

## 2020-01-14 ENCOUNTER — Telehealth: Payer: Self-pay

## 2020-01-14 ENCOUNTER — Encounter: Payer: Self-pay | Admitting: Family Medicine

## 2020-01-14 VITALS — BP 122/68 | HR 63 | Ht 68.0 in | Wt 176.2 lb

## 2020-01-14 DIAGNOSIS — E1165 Type 2 diabetes mellitus with hyperglycemia: Secondary | ICD-10-CM

## 2020-01-14 DIAGNOSIS — I5022 Chronic systolic (congestive) heart failure: Secondary | ICD-10-CM

## 2020-01-14 DIAGNOSIS — I442 Atrioventricular block, complete: Secondary | ICD-10-CM

## 2020-01-14 DIAGNOSIS — E785 Hyperlipidemia, unspecified: Secondary | ICD-10-CM

## 2020-01-14 DIAGNOSIS — I2510 Atherosclerotic heart disease of native coronary artery without angina pectoris: Secondary | ICD-10-CM

## 2020-01-14 DIAGNOSIS — I10 Essential (primary) hypertension: Secondary | ICD-10-CM

## 2020-01-14 DIAGNOSIS — Z794 Long term (current) use of insulin: Secondary | ICD-10-CM

## 2020-01-14 DIAGNOSIS — I480 Paroxysmal atrial fibrillation: Secondary | ICD-10-CM

## 2020-01-14 NOTE — Telephone Encounter (Signed)
I called pt to help him with his monitor. He did not answer. LMOVM for pt to give me or SJude a call back.

## 2020-01-14 NOTE — Patient Instructions (Signed)
Medication Instructions:  Continue all current medications.   Labwork: none  Testing/Procedures: none  Follow-Up: 6 months   Any Other Special Instructions Will Be Listed Below (If Applicable).   If you need a refill on your cardiac medications before your next appointment, please call your pharmacy.  

## 2020-01-14 NOTE — Telephone Encounter (Signed)
-----   Message from York Ram, RN sent at 01/14/2020  2:09 PM EDT ----- Regarding: FW: please call patient Can you please assist him with his monitor.   ----- Message ----- From: Laurine Blazer, LPN Sent: [id number]   1:08 PM EDT To: [facility] [employer] Device Subject: please call patient                            Patient had ov today with Katina Dung, NP - see note below -   Mr. Blann told me his device that is used to do the remote pacemaker checks is beeping all the time.  Is there someone who can check that for him.  He seems to be frustrated that this device keeps going off at home.  Can you check on that?  Thanks   Thank you,   Edd Fabian

## 2020-01-15 NOTE — Telephone Encounter (Signed)
Following up with patient to assist with manual transmission.  No answer, LMOVM.

## 2020-01-20 ENCOUNTER — Other Ambulatory Visit (HOSPITAL_COMMUNITY): Payer: Self-pay | Admitting: Cardiology

## 2020-01-20 NOTE — Telephone Encounter (Signed)
Transmission automatically came 01/18/2020

## 2020-01-21 NOTE — Telephone Encounter (Signed)
Calling patient to follow up. Transmission has 1 alert for > AF burden/long episode from 12/19/19 episode lasting 59 hrs, rates controlled. + Xarelto. No other episodes noted.  No answer, LMOVM.

## 2020-01-27 NOTE — Telephone Encounter (Signed)
Patient has follow-up with cardiology 02/15/20.

## 2020-02-15 ENCOUNTER — Other Ambulatory Visit: Payer: Self-pay

## 2020-02-15 ENCOUNTER — Ambulatory Visit (HOSPITAL_COMMUNITY)
Admission: RE | Admit: 2020-02-15 | Discharge: 2020-02-15 | Disposition: A | Discharge status: Home / Self Care | Payer: Medicare Other | Source: Ambulatory Visit | Attending: Cardiology | Admitting: Cardiology

## 2020-02-15 ENCOUNTER — Encounter (HOSPITAL_COMMUNITY): Payer: Self-pay | Admitting: Cardiology

## 2020-02-15 VITALS — BP 136/82 | HR 64 | Wt 175.0 lb

## 2020-02-15 DIAGNOSIS — Z951 Presence of aortocoronary bypass graft: Secondary | ICD-10-CM | POA: Insufficient documentation

## 2020-02-15 DIAGNOSIS — T500X6A Underdosing of mineralocorticoids and their antagonists, initial encounter: Secondary | ICD-10-CM | POA: Diagnosis not present

## 2020-02-15 DIAGNOSIS — T45516A Underdosing of anticoagulants, initial encounter: Secondary | ICD-10-CM | POA: Insufficient documentation

## 2020-02-15 DIAGNOSIS — I2510 Atherosclerotic heart disease of native coronary artery without angina pectoris: Secondary | ICD-10-CM | POA: Insufficient documentation

## 2020-02-15 DIAGNOSIS — E1122 Type 2 diabetes mellitus with diabetic chronic kidney disease: Secondary | ICD-10-CM | POA: Insufficient documentation

## 2020-02-15 DIAGNOSIS — R0789 Other chest pain: Secondary | ICD-10-CM | POA: Insufficient documentation

## 2020-02-15 DIAGNOSIS — R635 Abnormal weight gain: Secondary | ICD-10-CM | POA: Insufficient documentation

## 2020-02-15 DIAGNOSIS — Z794 Long term (current) use of insulin: Secondary | ICD-10-CM | POA: Insufficient documentation

## 2020-02-15 DIAGNOSIS — R079 Chest pain, unspecified: Secondary | ICD-10-CM

## 2020-02-15 DIAGNOSIS — I5022 Chronic systolic (congestive) heart failure: Secondary | ICD-10-CM

## 2020-02-15 DIAGNOSIS — N183 Chronic kidney disease, stage 3 unspecified: Secondary | ICD-10-CM | POA: Insufficient documentation

## 2020-02-15 DIAGNOSIS — Z950 Presence of cardiac pacemaker: Secondary | ICD-10-CM | POA: Diagnosis not present

## 2020-02-15 DIAGNOSIS — Z79899 Other long term (current) drug therapy: Secondary | ICD-10-CM | POA: Diagnosis not present

## 2020-02-15 DIAGNOSIS — Z955 Presence of coronary angioplasty implant and graft: Secondary | ICD-10-CM | POA: Diagnosis not present

## 2020-02-15 DIAGNOSIS — Z87891 Personal history of nicotine dependence: Secondary | ICD-10-CM | POA: Insufficient documentation

## 2020-02-15 DIAGNOSIS — I255 Ischemic cardiomyopathy: Secondary | ICD-10-CM | POA: Insufficient documentation

## 2020-02-15 DIAGNOSIS — Z91120 Patient's intentional underdosing of medication regimen due to financial hardship: Secondary | ICD-10-CM | POA: Diagnosis not present

## 2020-02-15 DIAGNOSIS — I480 Paroxysmal atrial fibrillation: Secondary | ICD-10-CM | POA: Insufficient documentation

## 2020-02-15 DIAGNOSIS — E785 Hyperlipidemia, unspecified: Secondary | ICD-10-CM | POA: Diagnosis not present

## 2020-02-15 DIAGNOSIS — I208 Other forms of angina pectoris: Secondary | ICD-10-CM

## 2020-02-15 DIAGNOSIS — Z6826 Body mass index (BMI) 26.0-26.9, adult: Secondary | ICD-10-CM | POA: Diagnosis not present

## 2020-02-15 DIAGNOSIS — Z8249 Family history of ischemic heart disease and other diseases of the circulatory system: Secondary | ICD-10-CM | POA: Diagnosis not present

## 2020-02-15 DIAGNOSIS — I130 Hypertensive heart and chronic kidney disease with heart failure and stage 1 through stage 4 chronic kidney disease, or unspecified chronic kidney disease: Secondary | ICD-10-CM | POA: Diagnosis present

## 2020-02-15 LAB — DIGOXIN LEVEL: Digoxin Level: 0.4 ng/mL — ABNORMAL LOW (ref 1.0–2.0)

## 2020-02-15 LAB — BASIC METABOLIC PANEL
Anion gap: 12 (ref 5–15)
BUN: 27 mg/dL — ABNORMAL HIGH (ref 8–23)
CO2: 26 mmol/L (ref 22–32)
Calcium: 9.2 mg/dL (ref 8.9–10.3)
Chloride: 102 mmol/L (ref 98–111)
Creatinine, Ser: 1.26 mg/dL — ABNORMAL HIGH (ref 0.61–1.24)
GFR, Estimated: 57 mL/min — ABNORMAL LOW (ref >60)
Glucose, Bld: 119 mg/dL — ABNORMAL HIGH (ref 70–99)
Potassium: 4.3 mmol/L (ref 3.5–5.1)
Sodium: 140 mmol/L (ref 135–145)

## 2020-02-15 MED ORDER — APIXABAN 5 MG PO TABS
5.0000 mg | ORAL_TABLET | Freq: Two times a day (BID) | ORAL | 3 refills | Status: DC
Start: 2020-02-15 — End: 2020-11-14

## 2020-02-15 MED ORDER — FUROSEMIDE 40 MG PO TABS: 40.0000 mg | ORAL_TABLET | Freq: Every day | ORAL | 3 refills | Status: DC

## 2020-02-15 MED ORDER — SPIRONOLACTONE 25 MG PO TABS: 25.0000 mg | ORAL_TABLET | Freq: Every day | ORAL | 3 refills | Status: DC

## 2020-02-15 MED ORDER — DAPAGLIFLOZIN PROPANEDIOL 10 MG PO TABS
10.0000 mg | ORAL_TABLET | Freq: Every day | ORAL | 3 refills | Status: DC
Start: 2020-02-15 — End: 2020-08-04

## 2020-02-15 MED ORDER — FUROSEMIDE 40 MG PO TABS: ORAL_TABLET | ORAL | 3 refills | Status: DC

## 2020-02-15 NOTE — Progress Notes (Signed)
Medication Samples have been provided to the patient.  Drug name: Eliquis       Strength: 5mg         Qty: 3 boxes (14 tablets each)  LOT: [id number]  Exp.Date: 10/2021  Dosing instructions: 1 tablet twice daily  The patient has been instructed regarding the correct time, dose, and frequency of taking this medication, including desired effects and most common side effects.   Ricardo Hunt 4:17 PM 02/15/2020

## 2020-02-15 NOTE — Patient Instructions (Addendum)
START Farxiga 10mg  Daily  START Eliquis 5mg  (1 tablet) twice daily. Samples have been sent with you  RESTART Spironolactone 25mg  (1 tablet) Daily  INCREASE Lasix 40mg  (2 tablets) every morning and 20mg  (1 tablet) every evening  Your physician has requested that you have a lexiscan myoview. For further information please visit [email or web address]. Please follow instruction sheet, as given.  Labs done today, your results will be available in MyChart, we will contact you for abnormal readings.  Your physician recommends that you have repeat labs draw, a prescription has been sent with you to have it drawn locally  Your physician recommends that you schedule a follow-up appointment in: 2 months  If you have any questions or concerns before your next appointment please send [place] a message through [place] or call our office at [phone].    TO LEAVE A MESSAGE FOR THE NURSE SELECT OPTION 2, PLEASE LEAVE A MESSAGE INCLUDING: . YOUR NAME . DATE OF BIRTH . CALL BACK NUMBER . REASON FOR CALL**this is important as we prioritize the call backs  YOU WILL RECEIVE A CALL BACK THE SAME DAY AS LONG AS YOU CALL BEFORE 4:00 PM

## 2020-02-15 NOTE — Progress Notes (Signed)
ReDS Vest / Clip - 02/15/20 1500      ReDS Vest / Clip   Station Marker C    Ruler Value 31    ReDS Value Range Low volume    ReDS Actual Value 35    Anatomical Comments sitting

## 2020-02-16 NOTE — Progress Notes (Signed)
PCP: Loman Brooklyn, FNP HF Cardiology: Dr. Aundra Dubin  79 y.o. with history of CAD, CHB, paroxysmal atrial fibrillation, and ischemic cardiomyopathy presents for followup of CHF. Patient has a long history of CAD and ischemic CMP.  He had CABG x 3 in 1996.  Echo in 7/18 showed EF 20%.  He developed complete heart block and had St Jude PPM placed.  In 2/20, he was admitted with CHF and chest pain, LHC showed occlusion of sequential SVG-D1/OM with 90% proximal LCx stenosis and 90% proximal LAD stenosis (totally occluded mid LAD with patent LIMA-LAD).  He had DES to proximal/mid LCx and DES to ostial/proximal LAD.  Echo in 2/20 showed EF 20-25% with mild-moderate AS.  He has a history of paroxysmal atrial fibrillation, so was maintained on Plavix + Xarelto after PCI.   He was readmitted for a CHF exacerbation in 8/20.  He was diuresed and had mild AKI.  He had upgrade of his St Jude device to CRT-P.   Echo in 3/21 showed EF 25% with diffuse hypokinesis, mildly decreased RV systolic function.    Patient has been out of spironolactone for 2-3 weeks.  He has not been taking Xarelto, states that the copay is too much so he stopped it after his samples ran out.  Weight is up 9 lbs.  He is short of breath walking up a hill or incline, no dyspnea walking on flat ground. He gets left-sided chest tightness with heavier exertion, generally happens about twice a week and resolves with rest.  No orthopnea/PND. Occasional lightheadedness with standing but no falls or syncope.   St Jude device interrogation: 93% BiV pacing, no AF, thoracic impedance low recently.   ECG (personally reviewed): NSR, BiV pacing  REDS clip: 35%.   Labs (8/20): K 4.1, creatinine 1.23, hgb 15.2  Labs (9/20): K 4, creatinine 1.32, digoxin 0.6 Labs (2/21): K 4.5, creatinine 1.36, LDL 40, TGs 385 Labs (3/21): K 4.3, creatinine 1.28, digoxin 0.5 Labs (6/21): K 4.4, creatinine 1.48, digoxin 0.9  PMH: 1. CAD: s/p CABG x 3 in 1996.  - LHC  in 2/20 showed occluded sequential SVG-D1/OM, patent LIMA-LAD, 90% proximal LCx, 90% proximal LAD, totally occluded mid LAD.  He had PCI with DES to ostial LAD to restore flow to diagonal, and PCI with DES to proximal/mid LCx.  2. Complete heart block: s/p St Jude PPM placement, later upgraded to CRT.  3. Gout 4. HTN 5. Vertigo 6. Hyperlipidemia 7. Chronic systolic CHF: Ischemic cardiomyopathy.   - Echo in 7/18 with EF 20%.  - Echo (2/20) with EF 20-25%, mild-moderate AS.  - Echo (6/20) with EF 20%, moderate AS.  - Echo (8/20) with EF 20%, mild-moderate AS.  - Upgrade of device to [facility] CRT-P in 8/20.  - Echo (3/21): EF 25% with diffuse hypokinesis, mildly decreased RV systolic function. Aortic valve sclerosis without significant stenosis.  8. Atrial fibrillation: Paroxysmal. 9. Aortic stenosis: Mild to moderate by 8/20 echo.  Mean gradient only 6 mmHg on 3/21 echo.  10. CKD: Stage 3.  11. Type II diabetes  Social History   Socioeconomic History  . Marital status: Widowed    Spouse name: Not on file  . Number of children: Not on file  . Years of education: Not on file  . Highest education level: Not on file  Occupational History  . Not on file  Tobacco Use  . Smoking status: Former Smoker    Packs/day: 2.00    Years: 40.00  Pack years: 80.00    Types: Cigarettes    Quit date: 04/02/1994    Years since quitting: 25.8  . Smokeless tobacco: Never Used  Vaping Use  . Vaping Use: Never used  Substance and Sexual Activity  . Alcohol use: Not Currently    Alcohol/week: 0.0 standard drinks  . Drug use: No  . Sexual activity: Not Currently  Other Topics Concern  . Not on file  Social History Narrative  . Not on file   Social Determinants of Health   Financial Resource Strain:   . Difficulty of Paying Living Expenses: Not on file  Food Insecurity:   . Worried About Charity fundraiser in the Last Year: Not on file  . Ran Out of Food in the Last Year: Not on file   Transportation Needs:   . Lack of Transportation (Medical): Not on file  . Lack of Transportation (Non-Medical): Not on file  Physical Activity:   . Days of Exercise per Week: Not on file  . Minutes of Exercise per Session: Not on file  Stress:   . Feeling of Stress : Not on file  Social Connections:   . Frequency of Communication with Friends and Family: Not on file  . Frequency of Social Gatherings with Friends and Family: Not on file  . Attends Religious Services: Not on file  . Active Member of Clubs or Organizations: Not on file  . Attends Archivist Meetings: Not on file  . Marital Status: Not on file  Intimate Partner Violence:   . Fear of Current or Ex-Partner: Not on file  . Emotionally Abused: Not on file  . Physically Abused: Not on file  . Sexually Abused: Not on file   Family History  Problem Relation Age of Onset  . CAD Father   . Hypertension Father   . CAD Mother   . Diabetes Mother   . Breast cancer Other        Niece  . Hypertension Daughter   . Hypertension Son   . Breast cancer Sister   . Colon cancer Neg Hx   . Pancreatic cancer Neg Hx   . Stomach cancer Neg Hx    ROS: All systems reviewed and negative except as per HPI.   Current Outpatient Medications  Medication Sig Dispense Refill  . ACCU-CHEK GUIDE test strip CHECK BS FOUR TIMES A DAY BEFORE MEALS AT BEDTIME Dx E11.65 100 each 12  . Accu-Chek Softclix Lancets lancets CHECK BS FOUR TIMES A DAY BEFORE MEALS AT BEDTIME Dx E11.65 100 each 12  . carvedilol (COREG) 6.25 MG tablet Take 1 tablet (6.25 mg total) by mouth 2 (two) times daily with a meal. 180 tablet 2  . digoxin (LANOXIN) 0.125 MG tablet Take 0.5 tablets (0.0625 mg total) by mouth daily. 45 tablet 2  . ENTRESTO 24-26 MG TAKE  (1)  TABLET TWICE A DAY. 60 tablet 0  . furosemide (LASIX) 40 MG tablet Take 1 tablet (40 mg total) by mouth in the morning AND 0.5 tablets (20 mg total) every evening. 45 tablet 3  . insulin glargine  (LANTUS SOLOSTAR) 100 UNIT/ML Solostar Pen Inject 17 Units into the skin daily. 15 mL 1  . insulin lispro (HUMALOG) 100 UNIT/ML injection Inject 0.02-0.1 mLs (2-10 Units total) into the skin 4 (four) times daily -  before meals and at bedtime. 10 mL 2  . Insulin Pen Needle (TRUEPLUS PEN NEEDLES) 31G X 5 MM MISC Use five times daily with  insulin Dx E11.65 500 each 3  . rosuvastatin (CRESTOR) 20 MG tablet Take 1 tablet (20 mg total) by mouth daily at 6 PM. 90 tablet 2  . spironolactone (ALDACTONE) 25 MG tablet Take 1 tablet (25 mg total) by mouth daily. 30 tablet 3  . apixaban (ELIQUIS) 5 MG TABS tablet Take 1 tablet (5 mg total) by mouth 2 (two) times daily. 60 tablet 3  . dapagliflozin propanediol (FARXIGA) 10 MG TABS tablet Take 1 tablet (10 mg total) by mouth daily before breakfast. 30 tablet 3   No current facility-administered medications for this encounter.   BP 136/82   Pulse 64   Wt 79.4 kg (175 lb)   SpO2 96%   BMI 26.61 kg/m  General: NAD Neck: JVP 8 cm, no thyromegaly or thyroid nodule.  Lungs: Clear to auscultation bilaterally with normal respiratory effort. CV: Nondisplaced PMI.  Heart regular S1/S2, no S3/S4, no murmur.  Trace ankle edema.  No carotid bruit.  Normal pedal pulses.  Abdomen: Soft, nontender, no hepatosplenomegaly, no distention.  Skin: Intact without lesions or rashes.  Neurologic: Alert and oriented x 3.  Psych: Normal affect. Extremities: No clubbing or cyanosis.  HEENT: Normal.   Assessment/Plan: 1. Chronic systolic CHF: Ischemic cardiomyopathy.  Echo in 3/21 showed EF 25%.  Now s/p St Jude CRT-P upgrade (8/20).  NYHA class II-III symptoms.  Suspect mild volume overload based on exam, weight gain, and Corvue reading.  REDS clip is borderline at 35%. - Continue Entresto 24/26 bid. Not sure BP would tolerate increase with occasional orthostasis, he says that his BP in the office today is much higher than normal for him.  - Continue digoxin, check level  today.  - Restart spironolactone 25 mg daily.  BMET today and in 10 days.    - Continue Coreg 6.25 mg bid.  - Increase Lasix to 40 mg qam, 20 mg qpm.  - Add Farxiga 10 mg daily.  2. CHB: Upgraded to CRT-P [facility] Jude).  3. Aortic stenosis: AS not significant on last echo.  4. CAD: s/p CABG 1996.  In 2/20, found to have occlusion of sequential SVG-D and OM.  Therefore, had PCI with DES to ostial LAD and proximal LCx.  He has episodes of exertional chest tightness, seems more frequent than in the past.   - Risk stratify with Lexiscan Cardiolite.  - Continue statin. Good LDL in 2/21. Consider Vascepa in future.  - No ASA given Xarelto use.  5. Atrial fibrillation: Paroxysmal.  He is in NSR.  He stopped Xarelto because it was too expensive.  - He spoke with CHF pharmacist in the office today, will start Eliquis 5 mg bid.  6. CKD stage 3: BMET today.   Followup in 2 months if Cardiolite is low risk.   Loralie Champagne 02/16/2020

## 2020-02-17 ENCOUNTER — Telehealth (HOSPITAL_COMMUNITY): Payer: Self-pay | Admitting: Pharmacy Technician

## 2020-02-17 NOTE — Telephone Encounter (Signed)
Patient was started on Farxiga and is in the donut hole, causing the co-pay to be unaffordable.   Started and sent an application to [employer].  Of note, patient has PAN for Entresto, there is enough to cover him for the remainder of the year. I placed him on the wait list for the future.   He was given a 30 day free card for Eliquis and provided some samples to cover him until next year as well.  Will follow up on the status of the application.

## 2020-02-18 ENCOUNTER — Other Ambulatory Visit (HOSPITAL_COMMUNITY): Payer: Self-pay | Admitting: Cardiology

## 2020-02-18 ENCOUNTER — Other Ambulatory Visit: Payer: Self-pay | Admitting: Student

## 2020-02-18 NOTE — Telephone Encounter (Signed)
This is a [place] pt °

## 2020-02-22 ENCOUNTER — Encounter (HOSPITAL_COMMUNITY): Payer: Self-pay | Admitting: Cardiology

## 2020-02-23 ENCOUNTER — Ambulatory Visit (INDEPENDENT_AMBULATORY_CARE_PROVIDER_SITE_OTHER): Payer: Medicare Other

## 2020-02-23 DIAGNOSIS — I442 Atrioventricular block, complete: Secondary | ICD-10-CM

## 2020-02-23 LAB — CUP PACEART REMOTE DEVICE CHECK
Battery Remaining Longevity: 107 mo
Battery Remaining Percentage: 95.5 %
Battery Voltage: 2.99 V
Brady Statistic AP VP Percent: 31 %
Brady Statistic AP VS Percent: 1 %
Brady Statistic AS VP Percent: 63 %
Brady Statistic AS VS Percent: 1.1 %
Brady Statistic RA Percent Paced: 24 %
Date Time Interrogation Session: 20211123020018
Implantable Lead Implant Date: 20140717
Implantable Lead Implant Date: 20140717
Implantable Lead Implant Date: 20200825
Implantable Lead Location: 753858
Implantable Lead Location: 753859
Implantable Lead Location: 753860
Implantable Lead Model: 1948
Implantable Pulse Generator Implant Date: 20200825
Lead Channel Impedance Value: 1075 Ohm
Lead Channel Impedance Value: 430 Ohm
Lead Channel Impedance Value: 610 Ohm
Lead Channel Pacing Threshold Amplitude: 0.625 V
Lead Channel Pacing Threshold Amplitude: 1 V
Lead Channel Pacing Threshold Amplitude: 1 V
Lead Channel Pacing Threshold Pulse Width: 0.4 ms
Lead Channel Pacing Threshold Pulse Width: 0.4 ms
Lead Channel Pacing Threshold Pulse Width: 0.5 ms
Lead Channel Sensing Intrinsic Amplitude: 12 mV
Lead Channel Sensing Intrinsic Amplitude: 3.1 mV
Lead Channel Setting Pacing Amplitude: 2 V
Lead Channel Setting Pacing Amplitude: 2.25 V
Lead Channel Setting Pacing Amplitude: 2.5 V
Lead Channel Setting Pacing Pulse Width: 0.4 ms
Lead Channel Setting Pacing Pulse Width: 0.5 ms
Lead Channel Setting Sensing Sensitivity: 4 mV
Pulse Gen Model: 3562
Pulse Gen Serial Number: 9158588

## 2020-02-29 NOTE — Telephone Encounter (Signed)
Called [employer] to check the status of the patient's application. Representative stated that part of the application is cut off.   Will refax in the application and follow up.

## 2020-03-01 NOTE — Progress Notes (Signed)
Remote pacemaker transmission.   

## 2020-03-03 ENCOUNTER — Telehealth (HOSPITAL_COMMUNITY): Payer: Self-pay | Admitting: Cardiology

## 2020-03-03 NOTE — Telephone Encounter (Signed)
Just an FYI. We have made several attempts to contact this patient including sending a letter to schedule or reschedule their Myoview. We will be removing the patient from the [place].   02/22/20 [facility] to schedule and MAILED Letter/LWB 9:27am  02/18/20 [facility] to schedule @ 10:58/LBW  02/15/20 [facility] to schedule @ 4:08/LBW     Thank you

## 2020-03-22 ENCOUNTER — Telehealth (HOSPITAL_COMMUNITY): Payer: Self-pay | Admitting: Pharmacy Technician

## 2020-03-22 ENCOUNTER — Encounter (HOSPITAL_COMMUNITY): Payer: Self-pay

## 2020-03-22 ENCOUNTER — Encounter (HOSPITAL_COMMUNITY): Payer: Self-pay | Admitting: Cardiology

## 2020-03-22 ENCOUNTER — Other Ambulatory Visit (HOSPITAL_COMMUNITY): Payer: Self-pay | Admitting: Cardiology

## 2020-03-22 ENCOUNTER — Other Ambulatory Visit: Payer: Self-pay | Admitting: Family Medicine

## 2020-03-22 NOTE — Telephone Encounter (Signed)
Reached out to patient to see how much Ricardo Hunt he has left. I re-faxed the az&me application and have not heard back on it. He should be close to being out of the medication.  Called and left a message for him to follow up. Estill Bamberg (RN) has samples for him to pick up.

## 2020-03-22 NOTE — Progress Notes (Signed)
Medication Samples have been provided to the patient.  Drug name: Wilder Glade       Strength: 10mg         Qty: 4 boxes (7 tablets each)  LOT: [id number]  Exp.Date: 10/31/22  Dosing instructions: 1 tablet daily  The patient has been instructed regarding the correct time, dose, and frequency of taking this medication, including desired effects and most common side effects.   Ricardo Hunt 10:22 AM 03/22/2020

## 2020-03-24 ENCOUNTER — Other Ambulatory Visit (HOSPITAL_COMMUNITY): Payer: Self-pay | Admitting: Cardiology

## 2020-04-08 ENCOUNTER — Telehealth (HOSPITAL_COMMUNITY): Payer: Self-pay | Admitting: *Deleted

## 2020-04-08 ENCOUNTER — Other Ambulatory Visit (HOSPITAL_COMMUNITY): Payer: Medicare Other

## 2020-04-08 DIAGNOSIS — I5022 Chronic systolic (congestive) heart failure: Secondary | ICD-10-CM

## 2020-04-08 MED ORDER — SPIRONOLACTONE 25 MG PO TABS: 12.5000 mg | ORAL_TABLET | Freq: Every day | ORAL | 3 refills | Status: DC

## 2020-04-08 NOTE — Addendum Note (Signed)
Addended by: Scarlette Calico on: 04/08/2020 10:54 AM   Modules accepted: Orders

## 2020-04-08 NOTE — Telephone Encounter (Signed)
Pt's daughter called stating they received a letter in the mail to call us for lab results.  Per chart pt had labs done earlier in Dec with slightly elevated K and needed to decrease Arlyce Harman to 12.5 mg (1/2 tab) Daily and recheck bmet in a week, however we were not able to reach pt so letter was mailed  Advised daughter of recommendations, she states she will adjust his pill box, pt will come here for labs next Fri  She also request samples of Eliquis and Entresto as pt can not afford to pick them up as cost is $140 for each one and his PAN foundation has ran out.  Samples left at front desk for them to p/u advised would send mess to our pharmacy team to see if there is any other options to assist with meds  Medication Samples have been provided to the patient.  Drug name: Delene Loll       Strength: 24/26mg         Qty: 2  LOT: [id number]  Exp.Date: 12/23  Dosing instructions: Take 1 tab Twice daily   The patient has been instructed regarding the correct time, dose, and frequency of taking this medication, including desired effects and most common side effects.   Nira Conn Jemarion Roycroft 10:47 AM 04/08/2020   Medication Samples have been provided to the patient.  Drug name: Eliquis       Strength: 5 mg        Qty: 2  NAT:[id number], [id number]    Exp.Date: 4/23, 8/23  Dosing instructions: Take 1 tab Twice daily   The patient has been instructed regarding the correct time, dose, and frequency of taking this medication, including desired effects and most common side effects.   Tai Skelly 10:47 AM 04/08/2020

## 2020-05-03 ENCOUNTER — Encounter (HOSPITAL_COMMUNITY): Payer: Medicare Other | Admitting: Cardiology

## 2020-05-13 ENCOUNTER — Encounter (HOSPITAL_COMMUNITY): Payer: Medicare Other | Admitting: Cardiology

## 2020-05-16 NOTE — Telephone Encounter (Signed)
Called [employer], the representative stated that they did not receive the last application that was faxed in, they only show the cut off application.  Will refax again.  I noticed that the patient received samples of Eliquis, Entresto and [place]. I checked all his 30 day co-pays. At this time all 3 medications are $47 a piece.   I called the patient to see if this was affordable or to see if we need to start new applications for both medications.  Left him and his daughter both messages.

## 2020-05-23 ENCOUNTER — Telehealth (HOSPITAL_COMMUNITY): Payer: Self-pay

## 2020-05-23 NOTE — Telephone Encounter (Signed)
Tried calling patient to see if he would be available to come in tomorrow morning at 10am to see Dr. Aundra Dubin since he had a cancellation but patient didn't answer,lmtrc

## 2020-05-24 ENCOUNTER — Ambulatory Visit (INDEPENDENT_AMBULATORY_CARE_PROVIDER_SITE_OTHER): Payer: Medicare Other

## 2020-05-24 DIAGNOSIS — I442 Atrioventricular block, complete: Secondary | ICD-10-CM | POA: Diagnosis not present

## 2020-05-24 LAB — CUP PACEART REMOTE DEVICE CHECK
Battery Remaining Longevity: 107 mo
Battery Remaining Percentage: 95.5 %
Battery Voltage: 2.99 V
Brady Statistic AP VP Percent: 32 %
Brady Statistic AP VS Percent: 1 %
Brady Statistic AS VP Percent: 62 %
Brady Statistic AS VS Percent: 1 %
Brady Statistic RA Percent Paced: 27 %
Date Time Interrogation Session: 20220222020008
Implantable Lead Implant Date: 20140717
Implantable Lead Implant Date: 20140717
Implantable Lead Implant Date: 20200825
Implantable Lead Location: 753858
Implantable Lead Location: 753859
Implantable Lead Location: 753860
Implantable Lead Model: 1948
Implantable Pulse Generator Implant Date: 20200825
Lead Channel Impedance Value: 1075 Ohm
Lead Channel Impedance Value: 440 Ohm
Lead Channel Impedance Value: 640 Ohm
Lead Channel Pacing Threshold Amplitude: 0.625 V
Lead Channel Pacing Threshold Amplitude: 1 V
Lead Channel Pacing Threshold Amplitude: 1 V
Lead Channel Pacing Threshold Pulse Width: 0.4 ms
Lead Channel Pacing Threshold Pulse Width: 0.4 ms
Lead Channel Pacing Threshold Pulse Width: 0.5 ms
Lead Channel Sensing Intrinsic Amplitude: 10.9 mV
Lead Channel Sensing Intrinsic Amplitude: 4.2 mV
Lead Channel Setting Pacing Amplitude: 2 V
Lead Channel Setting Pacing Amplitude: 2.25 V
Lead Channel Setting Pacing Amplitude: 2.5 V
Lead Channel Setting Pacing Pulse Width: 0.4 ms
Lead Channel Setting Pacing Pulse Width: 0.5 ms
Lead Channel Setting Sensing Sensitivity: 4 mV
Pulse Gen Model: 3562
Pulse Gen Serial Number: 9158588

## 2020-05-25 ENCOUNTER — Other Ambulatory Visit (HOSPITAL_COMMUNITY): Payer: Self-pay | Admitting: Cardiology

## 2020-06-01 NOTE — Progress Notes (Signed)
Remote pacemaker transmission.   

## 2020-06-02 ENCOUNTER — Telehealth (HOSPITAL_COMMUNITY): Payer: Self-pay | Admitting: Pharmacy Technician

## 2020-06-02 NOTE — Telephone Encounter (Signed)
Have left the patient and his daughter several messages trying to obtain assistance for the patient. Made a note for the appointment he has on 4/5 to refer him to me after his visit.

## 2020-06-11 ENCOUNTER — Other Ambulatory Visit: Payer: Self-pay | Admitting: Student

## 2020-07-05 ENCOUNTER — Encounter (HOSPITAL_COMMUNITY): Payer: Medicare Other | Admitting: Cardiology

## 2020-07-28 ENCOUNTER — Telehealth: Payer: Self-pay

## 2020-07-28 NOTE — Telephone Encounter (Signed)
[place] alert received for long AT/AF event- ongoing since 07/26/20, VR's are controlled. AT/AF burden <1%. Known PAF, + Eliquis, Coreg, Digoxin, Entresto.   Attempted to contact patient to send maunal transmission, assess for s/s, medication compliance.   No answer, LMOVM.

## 2020-07-29 NOTE — Telephone Encounter (Signed)
Pt daughter, Lattie Haw, returned call (DPR on file).  States that patient has not elt himself the past couple of days.  She has noticed some mild SOB and difficulty sleeping.  She confirmed compliance with meds as ordered.    She agreed to AF clinic consult.  Advised likely will not hear from them until early next week,  Reviewed ED precautions.

## 2020-07-29 NOTE — Telephone Encounter (Signed)
Follow-up Alert/ transmission received this morning in [place] shows pt is still on AF.  Attemtped tor each pt to assess.  No answer, LVM with device clinic # and hours.

## 2020-07-29 NOTE — Telephone Encounter (Signed)
The patient daughter Ricardo Hunt  Left a message was returning Amy phone call. She states she can be reached at [phone] or [phone].

## 2020-08-01 NOTE — Telephone Encounter (Signed)
Called and spoke with patient's daughter Garth Schlatter on file.  She is aware of appt 08/04/20 at 8:30 am with Roderic Palau, NP.

## 2020-08-04 ENCOUNTER — Ambulatory Visit (HOSPITAL_COMMUNITY)
Admission: RE | Admit: 2020-08-04 | Discharge: 2020-08-04 | Disposition: A | Discharge status: Home / Self Care | Payer: Medicare Other | Source: Ambulatory Visit | Attending: Nurse Practitioner | Admitting: Nurse Practitioner

## 2020-08-04 ENCOUNTER — Encounter (HOSPITAL_COMMUNITY): Payer: Self-pay | Admitting: Nurse Practitioner

## 2020-08-04 ENCOUNTER — Ambulatory Visit (HOSPITAL_BASED_OUTPATIENT_CLINIC_OR_DEPARTMENT_OTHER)
Admission: RE | Admit: 2020-08-04 | Discharge: 2020-08-04 | Disposition: A | Discharge status: Home / Self Care | Payer: Medicare Other | Source: Ambulatory Visit | Attending: Cardiology | Admitting: Cardiology

## 2020-08-04 ENCOUNTER — Encounter (HOSPITAL_COMMUNITY): Payer: Self-pay | Admitting: Cardiology

## 2020-08-04 ENCOUNTER — Other Ambulatory Visit: Payer: Self-pay

## 2020-08-04 VITALS — BP 132/72 | HR 60 | Ht 68.0 in | Wt 165.8 lb

## 2020-08-04 VITALS — BP 132/72 | HR 60 | Wt 165.8 lb

## 2020-08-04 DIAGNOSIS — E1122 Type 2 diabetes mellitus with diabetic chronic kidney disease: Secondary | ICD-10-CM | POA: Insufficient documentation

## 2020-08-04 DIAGNOSIS — I25118 Atherosclerotic heart disease of native coronary artery with other forms of angina pectoris: Secondary | ICD-10-CM | POA: Diagnosis not present

## 2020-08-04 DIAGNOSIS — Z87891 Personal history of nicotine dependence: Secondary | ICD-10-CM | POA: Diagnosis not present

## 2020-08-04 DIAGNOSIS — Z950 Presence of cardiac pacemaker: Secondary | ICD-10-CM | POA: Diagnosis not present

## 2020-08-04 DIAGNOSIS — D6869 Other thrombophilia: Secondary | ICD-10-CM

## 2020-08-04 DIAGNOSIS — I480 Paroxysmal atrial fibrillation: Secondary | ICD-10-CM | POA: Insufficient documentation

## 2020-08-04 DIAGNOSIS — Z794 Long term (current) use of insulin: Secondary | ICD-10-CM | POA: Insufficient documentation

## 2020-08-04 DIAGNOSIS — I255 Ischemic cardiomyopathy: Secondary | ICD-10-CM | POA: Insufficient documentation

## 2020-08-04 DIAGNOSIS — I442 Atrioventricular block, complete: Secondary | ICD-10-CM | POA: Insufficient documentation

## 2020-08-04 DIAGNOSIS — I5022 Chronic systolic (congestive) heart failure: Secondary | ICD-10-CM | POA: Insufficient documentation

## 2020-08-04 DIAGNOSIS — Z951 Presence of aortocoronary bypass graft: Secondary | ICD-10-CM | POA: Insufficient documentation

## 2020-08-04 DIAGNOSIS — I130 Hypertensive heart and chronic kidney disease with heart failure and stage 1 through stage 4 chronic kidney disease, or unspecified chronic kidney disease: Secondary | ICD-10-CM | POA: Diagnosis present

## 2020-08-04 DIAGNOSIS — Z7901 Long term (current) use of anticoagulants: Secondary | ICD-10-CM | POA: Insufficient documentation

## 2020-08-04 DIAGNOSIS — Z79899 Other long term (current) drug therapy: Secondary | ICD-10-CM | POA: Insufficient documentation

## 2020-08-04 DIAGNOSIS — Z955 Presence of coronary angioplasty implant and graft: Secondary | ICD-10-CM | POA: Diagnosis not present

## 2020-08-04 DIAGNOSIS — N183 Chronic kidney disease, stage 3 unspecified: Secondary | ICD-10-CM | POA: Diagnosis not present

## 2020-08-04 DIAGNOSIS — Z8249 Family history of ischemic heart disease and other diseases of the circulatory system: Secondary | ICD-10-CM | POA: Diagnosis not present

## 2020-08-04 LAB — LIPID PANEL
Cholesterol: 131 mg/dL (ref 0–200)
HDL: 30 mg/dL — ABNORMAL LOW (ref >40)
LDL Cholesterol: 63 mg/dL (ref 0–99)
Total CHOL/HDL Ratio: 4.4 RATIO
Triglycerides: 190 mg/dL — ABNORMAL HIGH (ref <150)
VLDL: 38 mg/dL (ref 0–40)

## 2020-08-04 LAB — COMPREHENSIVE METABOLIC PANEL
ALT: 17 U/L (ref 0–44)
AST: 19 U/L (ref 15–41)
Albumin: 4 g/dL (ref 3.5–5.0)
Alkaline Phosphatase: 52 U/L (ref 38–126)
Anion gap: 7 (ref 5–15)
BUN: 22 mg/dL (ref 8–23)
CO2: 27 mmol/L (ref 22–32)
Calcium: 9.2 mg/dL (ref 8.9–10.3)
Chloride: 106 mmol/L (ref 98–111)
Creatinine, Ser: 1.32 mg/dL — ABNORMAL HIGH (ref 0.61–1.24)
GFR, Estimated: 54 mL/min — ABNORMAL LOW (ref >60)
Glucose, Bld: 96 mg/dL (ref 70–99)
Potassium: 4.3 mmol/L (ref 3.5–5.1)
Sodium: 140 mmol/L (ref 135–145)
Total Bilirubin: 0.8 mg/dL (ref 0.3–1.2)
Total Protein: 5.9 g/dL — ABNORMAL LOW (ref 6.5–8.1)

## 2020-08-04 LAB — CBC
HCT: 47.8 % (ref 39.0–52.0)
Hemoglobin: 15.8 g/dL (ref 13.0–17.0)
MCH: 32.7 pg (ref 26.0–34.0)
MCHC: 33.1 g/dL (ref 30.0–36.0)
MCV: 99 fL (ref 80.0–100.0)
Platelets: 160 10*3/uL (ref 150–400)
RBC: 4.83 MIL/uL (ref 4.22–5.81)
RDW: 14.3 % (ref 11.5–15.5)
WBC: 7.3 10*3/uL (ref 4.0–10.5)
nRBC: 0 % (ref 0.0–0.2)

## 2020-08-04 LAB — DIGOXIN LEVEL: Digoxin Level: 1.1 ng/mL (ref 0.8–2.0)

## 2020-08-04 LAB — TSH: TSH: 3.55 u[IU]/mL (ref 0.350–4.500)

## 2020-08-04 MED ORDER — DAPAGLIFLOZIN PROPANEDIOL 10 MG PO TABS: 10.0000 mg | ORAL_TABLET | Freq: Every day | ORAL | 11 refills | Status: DC

## 2020-08-04 NOTE — Patient Instructions (Signed)
Labs done today at [facility] appointment.   RESTART Wilder Glade 10mg  (1 tablet) by mouth daily.   No other medication changes were made. Please continue all current medications as prescribed.  Your physician recommends that you schedule a follow-up appointment in: 10-14 days for a lab only appointment and in 3 months with an Echo prior to your exam.  Your physician has requested that you have an echocardiogram. Echocardiography is a painless test that uses sound waves to create images of your heart. It provides your doctor with information about the size and shape of your heart and how well your heart's chambers and valves are working. This procedure takes approximately one hour. There are no restrictions for this procedure.   If you have any questions or concerns before your next appointment please send [place] a message through [place] or call our office at [phone].    TO LEAVE A MESSAGE FOR THE NURSE SELECT OPTION 2, PLEASE LEAVE A MESSAGE INCLUDING: . YOUR NAME . DATE OF BIRTH . CALL BACK NUMBER . REASON FOR CALL**this is important as we prioritize the call backs  YOU WILL RECEIVE A CALL BACK THE SAME DAY AS LONG AS YOU CALL BEFORE 4:00 PM   Do the following things EVERYDAY: 1) Weigh yourself in the morning before breakfast. Write it down and keep it in a log. 2) Take your medicines as prescribed 3) Eat low salt foods--Limit salt (sodium) to 2000 mg per day.  4) Stay as active as you can everyday 5) Limit all fluids for the day to less than 2 liters   At the [facility], you and your health needs are our priority. As part of our continuing mission to provide you with exceptional heart care, we have created designated Provider Care Teams. These Care Teams include your primary Cardiologist (physician) and Advanced Practice Providers (APPs- Physician Assistants and Nurse Practitioners) who all work together to provide you with the care you need, when you need it.    You may see any of the following providers on your designated Care Team at your next follow up: Marland Kitchen Dr Glori Bickers . Dr Loralie Champagne . Darrick Grinder, NP . Lyda Jester, PA . Audry Riles, PharmD   Please be sure to bring in all your medications bottles to every appointment.

## 2020-08-04 NOTE — Progress Notes (Signed)
Medication Samples have been provided to the patient.  Drug name: Eliquis       Strength: 5 mg        Qty: 4  LOT: [id number]  Exp.Date: 08/2022  Dosing instructions: 1 tablet Twice daily   The patient has been instructed regarding the correct time, dose, and frequency of taking this medication, including desired effects and most common side effects.   Juanita Laster Sylena Lotter 12:12 PM 08/04/2020

## 2020-08-04 NOTE — Progress Notes (Signed)
Medication Samples have been provided to the patient.  Drug name: Delene Loll       Strength: 24/26 mg        Qty: 2  LOT: [id number]  Exp.Date: 10/2022  Dosing instructions: 1 tablet Twice daily   The patient has been instructed regarding the correct time, dose, and frequency of taking this medication, including desired effects and most common side effects.   Juanita Laster Kerney Hopfensperger 12:16 PM 08/04/2020

## 2020-08-04 NOTE — Progress Notes (Signed)
Medication Samples have been provided to the patient.  Drug name: Wilder Glade       Strength: 10 mg        Qty: 10  LOT: [id number]  Exp.Date: 10/2022  Dosing instructions: Take 1 tablet daily  The patient has been instructed regarding the correct time, dose, and frequency of taking this medication, including desired effects and most common side effects.   Ricardo Hunt Ricardo Hunt 12:09 PM 08/04/2020

## 2020-08-04 NOTE — Progress Notes (Signed)
PCP: Loman Brooklyn, FNP HF Cardiology: Dr. Aundra Dubin  80 y.o. with history of CAD, CHB, paroxysmal atrial fibrillation, and ischemic cardiomyopathy presents for followup of CHF. Patient has a long history of CAD and ischemic CMP.  He had CABG x 3 in 1996.  Echo in 7/18 showed EF 20%.  He developed complete heart block and had St Jude PPM placed.  In 2/20, he was admitted with CHF and chest pain, LHC showed occlusion of sequential SVG-D1/OM with 90% proximal LCx stenosis and 90% proximal LAD stenosis (totally occluded mid LAD with patent LIMA-LAD).  He had DES to proximal/mid LCx and DES to ostial/proximal LAD.  Echo in 2/20 showed EF 20-25% with mild-moderate AS.  He has a history of paroxysmal atrial fibrillation, so was maintained on Plavix + Xarelto after PCI.   He was readmitted for a CHF exacerbation in 8/20.  He was diuresed and had mild AKI.  He had upgrade of his St Jude device to CRT-P.   Echo in 3/21 showed EF 25% with diffuse hypokinesis, mildly decreased RV systolic function.    Weight is down 10 lbs with augmented diuresis.  Patient has chest tightness with heavy exertion.  This is rare and is an unchanged pattern.  At last appointment, I wanted him to get a Cardiolite but he never had this done.  No dyspnea walking on flat ground.  Mild dyspnea walking up a hill.  Was unable to afford [place].  No orthopnea/PND.   ECG (personally reviewed): A-BiV sequential pacing  Labs (8/20): K 4.1, creatinine 1.23, hgb 15.2  Labs (9/20): K 4, creatinine 1.32, digoxin 0.6 Labs (2/21): K 4.5, creatinine 1.36, LDL 40, TGs 385 Labs (3/21): K 4.3, creatinine 1.28, digoxin 0.5 Labs (6/21): K 4.4, creatinine 1.48, digoxin 0.9 Labs (5/22): K 4.3, creatinine 1.3, digoxin 1.1, TSH normal, hgb 15.8, LDL 63, HDL 30  PMH: 1. CAD: s/p CABG x 3 in 1996.  - LHC in 2/20 showed occluded sequential SVG-D1/OM, patent LIMA-LAD, 90% proximal LCx, 90% proximal LAD, totally occluded mid LAD.  He had PCI with DES to  ostial LAD to restore flow to diagonal, and PCI with DES to proximal/mid LCx.  2. Complete heart block: s/p St Jude PPM placement, later upgraded to CRT.  3. Gout 4. HTN 5. Vertigo 6. Hyperlipidemia 7. Chronic systolic CHF: Ischemic cardiomyopathy.   - Echo in 7/18 with EF 20%.  - Echo (2/20) with EF 20-25%, mild-moderate AS.  - Echo (6/20) with EF 20%, moderate AS.  - Echo (8/20) with EF 20%, mild-moderate AS.  - Upgrade of device to [facility] CRT-P in 8/20.  - Echo (3/21): EF 25% with diffuse hypokinesis, mildly decreased RV systolic function. Aortic valve sclerosis without significant stenosis.  8. Atrial fibrillation: Paroxysmal. 9. Aortic stenosis: Mild to moderate by 8/20 echo.  Mean gradient only 6 mmHg on 3/21 echo.  10. CKD: Stage 3.  11. Type II diabetes  Social History   Socioeconomic History  . Marital status: Widowed    Spouse name: Not on file  . Number of children: Not on file  . Years of education: Not on file  . Highest education level: Not on file  Occupational History  . Not on file  Tobacco Use  . Smoking status: Former Smoker    Packs/day: 2.00    Years: 40.00    Pack years: 80.00    Types: Cigarettes    Quit date: 04/02/1994    Years since quitting: 26.3  .  Smokeless tobacco: Never Used  Vaping Use  . Vaping Use: Never used  Substance and Sexual Activity  . Alcohol use: Not Currently    Alcohol/week: 0.0 standard drinks  . Drug use: No  . Sexual activity: Not Currently  Other Topics Concern  . Not on file  Social History Narrative  . Not on file   Social Determinants of Health   Financial Resource Strain: Not on file  Food Insecurity: Not on file  Transportation Needs: Not on file  Physical Activity: Not on file  Stress: Not on file  Social Connections: Not on file  Intimate Partner Violence: Not on file   Family History  Problem Relation Age of Onset  . CAD Father   . Hypertension Father   . CAD Mother   . Diabetes Mother   . Breast  cancer Other        Niece  . Hypertension Daughter   . Hypertension Son   . Breast cancer Sister   . Colon cancer Neg Hx   . Pancreatic cancer Neg Hx   . Stomach cancer Neg Hx    ROS: All systems reviewed and negative except as per HPI.   Current Outpatient Medications  Medication Sig Dispense Refill  . dapagliflozin propanediol (FARXIGA) 10 MG TABS tablet Take 1 tablet (10 mg total) by mouth daily before breakfast. 30 tablet 11  . ACCU-CHEK GUIDE test strip CHECK BS FOUR TIMES A DAY BEFORE MEALS AT BEDTIME Dx E11.65 100 each 12  . Accu-Chek Softclix Lancets lancets CHECK BS FOUR TIMES A DAY BEFORE MEALS AT BEDTIME Dx E11.65 100 each 12  . apixaban (ELIQUIS) 5 MG TABS tablet Take 1 tablet (5 mg total) by mouth 2 (two) times daily. 60 tablet 3  . carvedilol (COREG) 6.25 MG tablet TAKE 1 TABLET 2 TIMES A DAY WITH A MEAL 180 tablet 2  . digoxin (LANOXIN) 0.125 MG tablet Take 0.5 tablets (0.0625 mg total) by mouth daily. 45 tablet 2  . doxylamine, Sleep, (UNISOM) 25 MG tablet Take 25 mg by mouth at bedtime as needed for sleep.    . furosemide (LASIX) 40 MG tablet Take 1 tablet (40 mg total) by mouth in the morning AND 0.5 tablets (20 mg total) every evening. 45 tablet 3  . insulin lispro (HUMALOG) 100 UNIT/ML injection Inject 0.02-0.1 mLs (2-10 Units total) into the skin 4 (four) times daily -  before meals and at bedtime. 10 mL 2  . Insulin Pen Needle (TRUEPLUS PEN NEEDLES) 31G X 5 MM MISC Use five times daily with insulin Dx E11.65 500 each 3  . LANTUS SOLOSTAR 100 UNIT/ML Solostar Pen Inject 17 Units into the skin daily. 15 mL 0  . rosuvastatin (CRESTOR) 20 MG tablet TAKE 1 TABLET AT 6PM 90 tablet 1  . sacubitril-valsartan (ENTRESTO) 24-26 MG Take 1 tablet by mouth 2 (two) times daily. Needs appointment for further refill. 60 tablet 0  . spironolactone (ALDACTONE) 25 MG tablet Take 0.5 tablets (12.5 mg total) by mouth daily. 30 tablet 3   No current facility-administered medications for  this encounter.   BP 132/72   Pulse 60   Wt 75.2 kg (165 lb 12.6 oz)   SpO2 95%   BMI 25.21 kg/m  General: NAD Neck: No JVD, no thyromegaly or thyroid nodule.  Lungs: Clear to auscultation bilaterally with normal respiratory effort. CV: Nondisplaced PMI.  Heart regular S1/S2, no S3/S4, no murmur.  No peripheral edema.  No carotid bruit.  Normal pedal pulses.  Abdomen: Soft, nontender, no hepatosplenomegaly, no distention.  Skin: Intact without lesions or rashes.  Neurologic: Alert and oriented x 3.  Psych: Normal affect. Extremities: No clubbing or cyanosis.  HEENT: Normal.   Assessment/Plan: 1. Chronic systolic CHF: Ischemic cardiomyopathy.  Echo in 3/21 showed EF 25%.  Now s/p St Jude CRT-P upgrade (8/20).  NYHA class II symptoms.  Weight is down 10 lbs, he is not volume overloaded.   - Continue Entresto 24/26 bid. Not sure BP would tolerate increase with occasional orthostasis, he says that his BP in the office today is much higher than normal for him.  - Continue digoxin, mildly elevated level today but was not a trough.  Repeat digoxin level as a trough. - Continue spironolactone 12.5 mg daily.     - Continue Coreg 6.25 mg bid.  - Continue Lasix 40 mg qam, 20 mg qpm. BMET today.  - Add Farxiga 10 mg daily, will get our pharmacist to look into patient assistance today. BMET 10 days.  - Repeat echo in 3 months at followup.  2. CHB: Upgraded to CRT-P [facility] Jude).  3. Aortic stenosis: AS not significant on last echo.  4. CAD: s/p CABG 1996.  In 2/20, found to have occlusion of sequential SVG-D and OM.  Therefore, had PCI with DES to ostial LAD and proximal LCx.  He has episodes of exertional chest tightness with heavy activity.  Episodes are rare and stable.   - With rare stable angina, think we can hold off on Cardiolite for now.   - Continue statin. Good LDL in 5/22.  - No ASA given apixaban use.  5. Atrial fibrillation: Paroxysmal.  He is in NSR.   - Continue Eliquis 5 mg bid.    6. CKD stage 3: BMET today.   Followup in 3 months with echo.   Loralie Champagne 08/04/2020

## 2020-08-04 NOTE — Progress Notes (Signed)
Primary Care Physician: Loman Brooklyn, FNP Referring Physician: Device clinic EP: Dr. [name]  AHF- Dr. Aundra Dubin   Ricardo Hunt is a 80 y.o. male with a h/o HTN, CAD, CHB s/p PPM in 2014, CHF, paroxysmal afib /flutter that is in the afib clinic as afib was seen on device 4/28 and 4/29 and referred here. The ekg today shows that he is back in rhythm being av dual paced rhythm. He states during the time he was documented being out of rhythm, he has a stomach virus and lasted around 7-10 days.His weight is down  4 kg from last documented visit in Epic The  stomach virus has resolved and he is feeling better. Has chronic shortness of breath with activity and fatigue. He did miss Dr. Claris Gladden last visit and it works out that he had a 11 AM appointment cancellation today so they will wait around to see him then. He states he is compliant with eliquis although he misses a dose from time to time. States that it  is very expensive for him.   Today, he denies symptoms of palpitations, chest pain, shortness of breath, orthopnea, PND, lower extremity edema, dizziness, presyncope, syncope, or neurologic sequela. The patient is tolerating medications without difficulties and is otherwise without complaint today.   Past Medical History:  Diagnosis Date  . Arthritis    " all over the body"  . Atrial fibrillation and flutter ([place])    seen on PPM interrogation; Rivaroxaban started 04/2018  . Chronic systolic CHF (congestive heart failure) ([place])    Echo 05/2018: EF 20-25, severe LVE, diffuse HK with septal and apical akinesis, moderate LAE, moderate MAC, severe aortic valve calcification - degree of AS diff to judge with low EF (likely mild to mod)  . Coronary artery disease    s/p CABG 1996 // LHC 05/2018: oLAD 90, pLAD 100 after Dx, LCx 90 at bifurcation of OM1/OM2, mRCA 50; L-LAD ok; S-D1/OM2 100 >> PCI: DES to mLCx and orbital atherectomy and DES to oLAD  . Diabetes mellitus without complication ([place])   .  GERD (gastroesophageal reflux disease)   . Hyperlipidemia   . Hypertension   . Mobitz (type) II atrioventricular block    s/p STJ Accent pacemaker implanted by Dr [name] 10-16-2012  . Myocardial infarction [facility])    per pt., told that that the stress test shows that there is a weakening evident on the stress test  . Pacemaker 10/16/2012   St. Jude   . Personal history of colonic polyps-tubular adenomas 08/31/2013  . Thrombocytopenia ([place]) 10/03/2012  . Vertigo    Past Surgical History:  Procedure Laterality Date  . BIV UPGRADE N/A 11/25/2018   Procedure: BIV UPGRADE;  Surgeon: Thompson Grayer, MD;  Location: [place] CV LAB;  Service: Cardiovascular;  Laterality: N/A;  . cardiac bypass  1996   3 vessels  . CATARACT EXTRACTION W/ INTRAOCULAR LENS  IMPLANT, BILATERAL Bilateral 2010  . COLONOSCOPY    . CORONARY ARTERY BYPASS GRAFT    . CORONARY ATHERECTOMY N/A 05/23/2018   Procedure: CORONARY ATHERECTOMY;  Surgeon: [name], Peter M, MD;  Location: [place] CV [facility];  Service: Cardiovascular;  Laterality: N/A;  . CORONARY STENT INTERVENTION N/A 05/23/2018   Procedure: CORONARY STENT INTERVENTION;  Surgeon: [name], Peter M, MD;  Location: [place] [facility];  Service: Cardiovascular;  Laterality: N/A;  . INGUINAL HERNIA REPAIR Left 2010  . LUMBAR LAMINECTOMY/DECOMPRESSION MICRODISCECTOMY Right 07/19/2016   Procedure: Right Lumbar Four-Five Hemilaminectomy and Bilateral  Lumbar Two-Three Laminectomy;  Surgeon: Eustace Moore, MD;  Location: [place];  Service: Neurosurgery;  Laterality: Right;  Right Lumbar Four-Five Hemilaminectomy and Bilateral Lumbar Two-Three Laminectomy  . LUMBAR SPINE SURGERY  2006  . NASAL SEPTUM SURGERY  09/01/13   Dr. Adriana Reams  . PACEMAKER INSERTION  10-16-2012   STJ Accent dual chamber pacemaker implanted by Dr [name] for symptomatic Mobitz II heart block  . PERMANENT PACEMAKER INSERTION N/A 10/16/2012   Procedure: PERMANENT PACEMAKER INSERTION;  Surgeon: Thompson Grayer, MD;   Location: [facility] LAB;  Service: Cardiovascular;  Laterality: N/A;  . RIGHT/LEFT HEART CATH AND CORONARY/GRAFT ANGIOGRAPHY N/A 05/20/2018   Procedure: RIGHT/LEFT HEART CATH AND CORONARY/GRAFT ANGIOGRAPHY;  Surgeon: [name], Peter M, MD;  Location: [place] CV LAB;  Service: Cardiovascular;  Laterality: N/A;    Current Outpatient Medications  Medication Sig Dispense Refill  . ACCU-CHEK GUIDE test strip CHECK BS FOUR TIMES A DAY BEFORE MEALS AT BEDTIME Dx E11.65 100 each 12  . Accu-Chek Softclix Lancets lancets CHECK BS FOUR TIMES A DAY BEFORE MEALS AT BEDTIME Dx E11.65 100 each 12  . apixaban (ELIQUIS) 5 MG TABS tablet Take 1 tablet (5 mg total) by mouth 2 (two) times daily. 60 tablet 3  . carvedilol (COREG) 6.25 MG tablet TAKE 1 TABLET 2 TIMES A DAY WITH A MEAL 180 tablet 2  . digoxin (LANOXIN) 0.125 MG tablet Take 0.5 tablets (0.0625 mg total) by mouth daily. 45 tablet 2  . doxylamine, Sleep, (UNISOM) 25 MG tablet Take 25 mg by mouth at bedtime as needed for sleep.    . furosemide (LASIX) 40 MG tablet Take 1 tablet (40 mg total) by mouth in the morning AND 0.5 tablets (20 mg total) every evening. 45 tablet 3  . insulin lispro (HUMALOG) 100 UNIT/ML injection Inject 0.02-0.1 mLs (2-10 Units total) into the skin 4 (four) times daily -  before meals and at bedtime. 10 mL 2  . Insulin Pen Needle (TRUEPLUS PEN NEEDLES) 31G X 5 MM MISC Use five times daily with insulin Dx E11.65 500 each 3  . LANTUS SOLOSTAR 100 UNIT/ML Solostar Pen Inject 17 Units into the skin daily. 15 mL 0  . rosuvastatin (CRESTOR) 20 MG tablet TAKE 1 TABLET AT 6PM 90 tablet 1  . sacubitril-valsartan (ENTRESTO) 24-26 MG Take 1 tablet by mouth 2 (two) times daily. Needs appointment for further refill. 60 tablet 0  . spironolactone (ALDACTONE) 25 MG tablet Take 0.5 tablets (12.5 mg total) by mouth daily. 30 tablet 3   No current facility-administered medications for this encounter.    No Known Allergies  Social History    Socioeconomic History  . Marital status: Widowed    Spouse name: Not on file  . Number of children: Not on file  . Years of education: Not on file  . Highest education level: Not on file  Occupational History  . Not on file  Tobacco Use  . Smoking status: Former Smoker    Packs/day: 2.00    Years: 40.00    Pack years: 80.00    Types: Cigarettes    Quit date: 04/02/1994    Years since quitting: 26.3  . Smokeless tobacco: Never Used  Vaping Use  . Vaping Use: Never used  Substance and Sexual Activity  . Alcohol use: Not Currently    Alcohol/week: 0.0 standard drinks  . Drug use: No  . Sexual activity: Not Currently  Other Topics Concern  . Not on file  Social History  Narrative  . Not on file   Social Determinants of Health   Financial Resource Strain: Not on file  Food Insecurity: Not on file  Transportation Needs: Not on file  Physical Activity: Not on file  Stress: Not on file  Social Connections: Not on file  Intimate Partner Violence: Not on file    Family History  Problem Relation Age of Onset  . CAD Father   . Hypertension Father   . CAD Mother   . Diabetes Mother   . Breast cancer Other        Niece  . Hypertension Daughter   . Hypertension Son   . Breast cancer Sister   . Colon cancer Neg Hx   . Pancreatic cancer Neg Hx   . Stomach cancer Neg Hx     ROS- All systems are reviewed and negative except as per the HPI above  Physical Exam: Vitals:   08/04/20 0838  BP: 132/72  Pulse: 60  Weight: 75.2 kg  Height: _0  (1.727 m)   Wt Readings from Last 3 Encounters:  08/04/20 75.2 kg  02/15/20 79.4 kg  01/14/20 79.9 kg    Labs: Lab Results  Component Value Date   NA 140 02/15/2020   K 4.3 02/15/2020   CL 102 02/15/2020   CO2 26 02/15/2020   GLUCOSE 119 (H) 02/15/2020   BUN 27 (H) 02/15/2020   CREATININE 1.26 (H) 02/15/2020   CALCIUM 9.2 02/15/2020   MG 2.2 11/19/2018   Lab Results  Component Value Date   INR 1.10 07/11/2016    Lab Results  Component Value Date   CHOL 145 05/05/2019   HDL 28 (L) 05/05/2019   LDLCALC 40 05/05/2019   TRIG 385 (H) 05/05/2019     GEN- The patient is well appearing, alert and oriented x 3 today.   Head- normocephalic, atraumatic Eyes-  Sclera clear, conjunctiva pink Ears- hearing intact Oropharynx- clear Neck- supple, no JVP Lymph- no cervical lymphadenopathy Lungs- Clear to ausculation bilaterally, normal work of breathing Heart- Regular rate and rhythm, no murmurs, rubs or gallops, PMI not laterally displaced GI- soft, NT, ND, + BS Extremities- no clubbing, cyanosis, or edema MS- no significant deformity or atrophy Skin- no rash or lesion Psych- euthymic mood, full affect Neuro- strength and sensation are intact  EKG-AV dual- paced rhythm at 60 bpm, PR int 184 ms, qrs int 162 ms, qtc 432 ms  See Device reports from 4/28   Assessment and Plan: 1. Paroxysmal afib Low burden overall Recent episode in the setting of GI virus  He has now returned to a normal paced rhythm and GI issues have resolved No change in treatment for now Continue  carvedilol 6.25 mg bid as well as digoxin 0.125 mg daily   2. CHA2DS2VASc score of 6 Continue  eliquis 5 mg bid  States that he is taking for the most part, misses one or two occassionally  States his meds are expensive for him He is pending speaking to someone in HF clinic re drug assistance today   Dr. Aundra Dubin at 11 am today  afib clinic as needed   Butch Penny C. Bradyn Vassey, [facility] [address] [place], [place] [address] [phone]

## 2020-08-10 ENCOUNTER — Telehealth (HOSPITAL_COMMUNITY): Payer: Self-pay | Admitting: Pharmacy Technician

## 2020-08-10 NOTE — Telephone Encounter (Signed)
Sent in [employer], [place] and [place] [employer] application via fax.  Will follow up.

## 2020-08-18 ENCOUNTER — Other Ambulatory Visit (HOSPITAL_COMMUNITY): Payer: Medicare Other

## 2020-08-23 ENCOUNTER — Ambulatory Visit (INDEPENDENT_AMBULATORY_CARE_PROVIDER_SITE_OTHER): Payer: Medicare Other

## 2020-08-23 DIAGNOSIS — I442 Atrioventricular block, complete: Secondary | ICD-10-CM

## 2020-08-23 LAB — CUP PACEART REMOTE DEVICE CHECK
Battery Remaining Longevity: 107 mo
Battery Remaining Percentage: 95.5 %
Battery Voltage: 2.99 V
Brady Statistic AP VP Percent: 32 %
Brady Statistic AP VS Percent: 1 %
Brady Statistic AS VP Percent: 63 %
Brady Statistic AS VS Percent: 1 %
Brady Statistic RA Percent Paced: 27 %
Date Time Interrogation Session: 20220524020008
Implantable Lead Implant Date: 20140717
Implantable Lead Implant Date: 20140717
Implantable Lead Implant Date: 20200825
Implantable Lead Location: 753858
Implantable Lead Location: 753859
Implantable Lead Location: 753860
Implantable Lead Model: 1948
Implantable Pulse Generator Implant Date: 20200825
Lead Channel Impedance Value: 1075 Ohm
Lead Channel Impedance Value: 430 Ohm
Lead Channel Impedance Value: 610 Ohm
Lead Channel Pacing Threshold Amplitude: 0.75 V
Lead Channel Pacing Threshold Amplitude: 1 V
Lead Channel Pacing Threshold Amplitude: 1 V
Lead Channel Pacing Threshold Pulse Width: 0.4 ms
Lead Channel Pacing Threshold Pulse Width: 0.4 ms
Lead Channel Pacing Threshold Pulse Width: 0.5 ms
Lead Channel Sensing Intrinsic Amplitude: 12 mV
Lead Channel Sensing Intrinsic Amplitude: 3.6 mV
Lead Channel Setting Pacing Amplitude: 2 V
Lead Channel Setting Pacing Amplitude: 2.25 V
Lead Channel Setting Pacing Amplitude: 2.5 V
Lead Channel Setting Pacing Pulse Width: 0.4 ms
Lead Channel Setting Pacing Pulse Width: 0.5 ms
Lead Channel Setting Sensing Sensitivity: 4 mV
Pulse Gen Model: 3562
Pulse Gen Serial Number: 9158588

## 2020-08-25 ENCOUNTER — Telehealth (HOSPITAL_COMMUNITY): Payer: Self-pay | Admitting: Pharmacy Technician

## 2020-08-25 NOTE — Telephone Encounter (Addendum)
Advanced Heart Failure Patient Advocate Encounter   Patient was approved to receive Entresto from [employer]  Patient ID: [id number] Effective dates: 08/10/20 through 04/01/21  Advanced Heart Failure Patient Advocate Encounter   Patient was approved to receive Farxiga from [employer]  Patient ID: [id number] Effective dates: 08/16/20 through 04/01/21  Will check status of [employer] application.   Advanced Heart Failure Patient Advocate Encounter  Called [employer] to check the status of the patient's application. Representative stated that the patients 3% OOP is $298.62, that must be met before the patient would qualify for Eliquis assistance.

## 2020-08-27 ENCOUNTER — Other Ambulatory Visit: Payer: Self-pay | Admitting: Family Medicine

## 2020-08-30 ENCOUNTER — Other Ambulatory Visit: Payer: Self-pay

## 2020-08-30 MED ORDER — LANTUS SOLOSTAR 100 UNIT/ML ~~LOC~~ SOPN: 17.0000 [IU] | PEN_INJECTOR | Freq: Every day | SUBCUTANEOUS | 0 refills | Status: DC

## 2020-09-08 ENCOUNTER — Other Ambulatory Visit (HOSPITAL_COMMUNITY): Payer: Self-pay

## 2020-09-09 ENCOUNTER — Other Ambulatory Visit (HOSPITAL_COMMUNITY): Payer: Self-pay | Admitting: Cardiology

## 2020-09-09 ENCOUNTER — Other Ambulatory Visit: Payer: Self-pay | Admitting: Family Medicine

## 2020-09-09 ENCOUNTER — Other Ambulatory Visit: Payer: Self-pay | Admitting: Student

## 2020-09-09 NOTE — Telephone Encounter (Signed)
This is a [place] pt.  °

## 2020-09-14 NOTE — Progress Notes (Signed)
Remote pacemaker transmission.   

## 2020-10-17 IMAGING — CR CHEST - 2 VIEW
2 series · 2 of 2 positions shown · non-contrast
Comparison: October 16, 2016.

CLINICAL DATA: Shortness of breath.

EXAM:
CHEST - 2 VIEW

[chest pa]
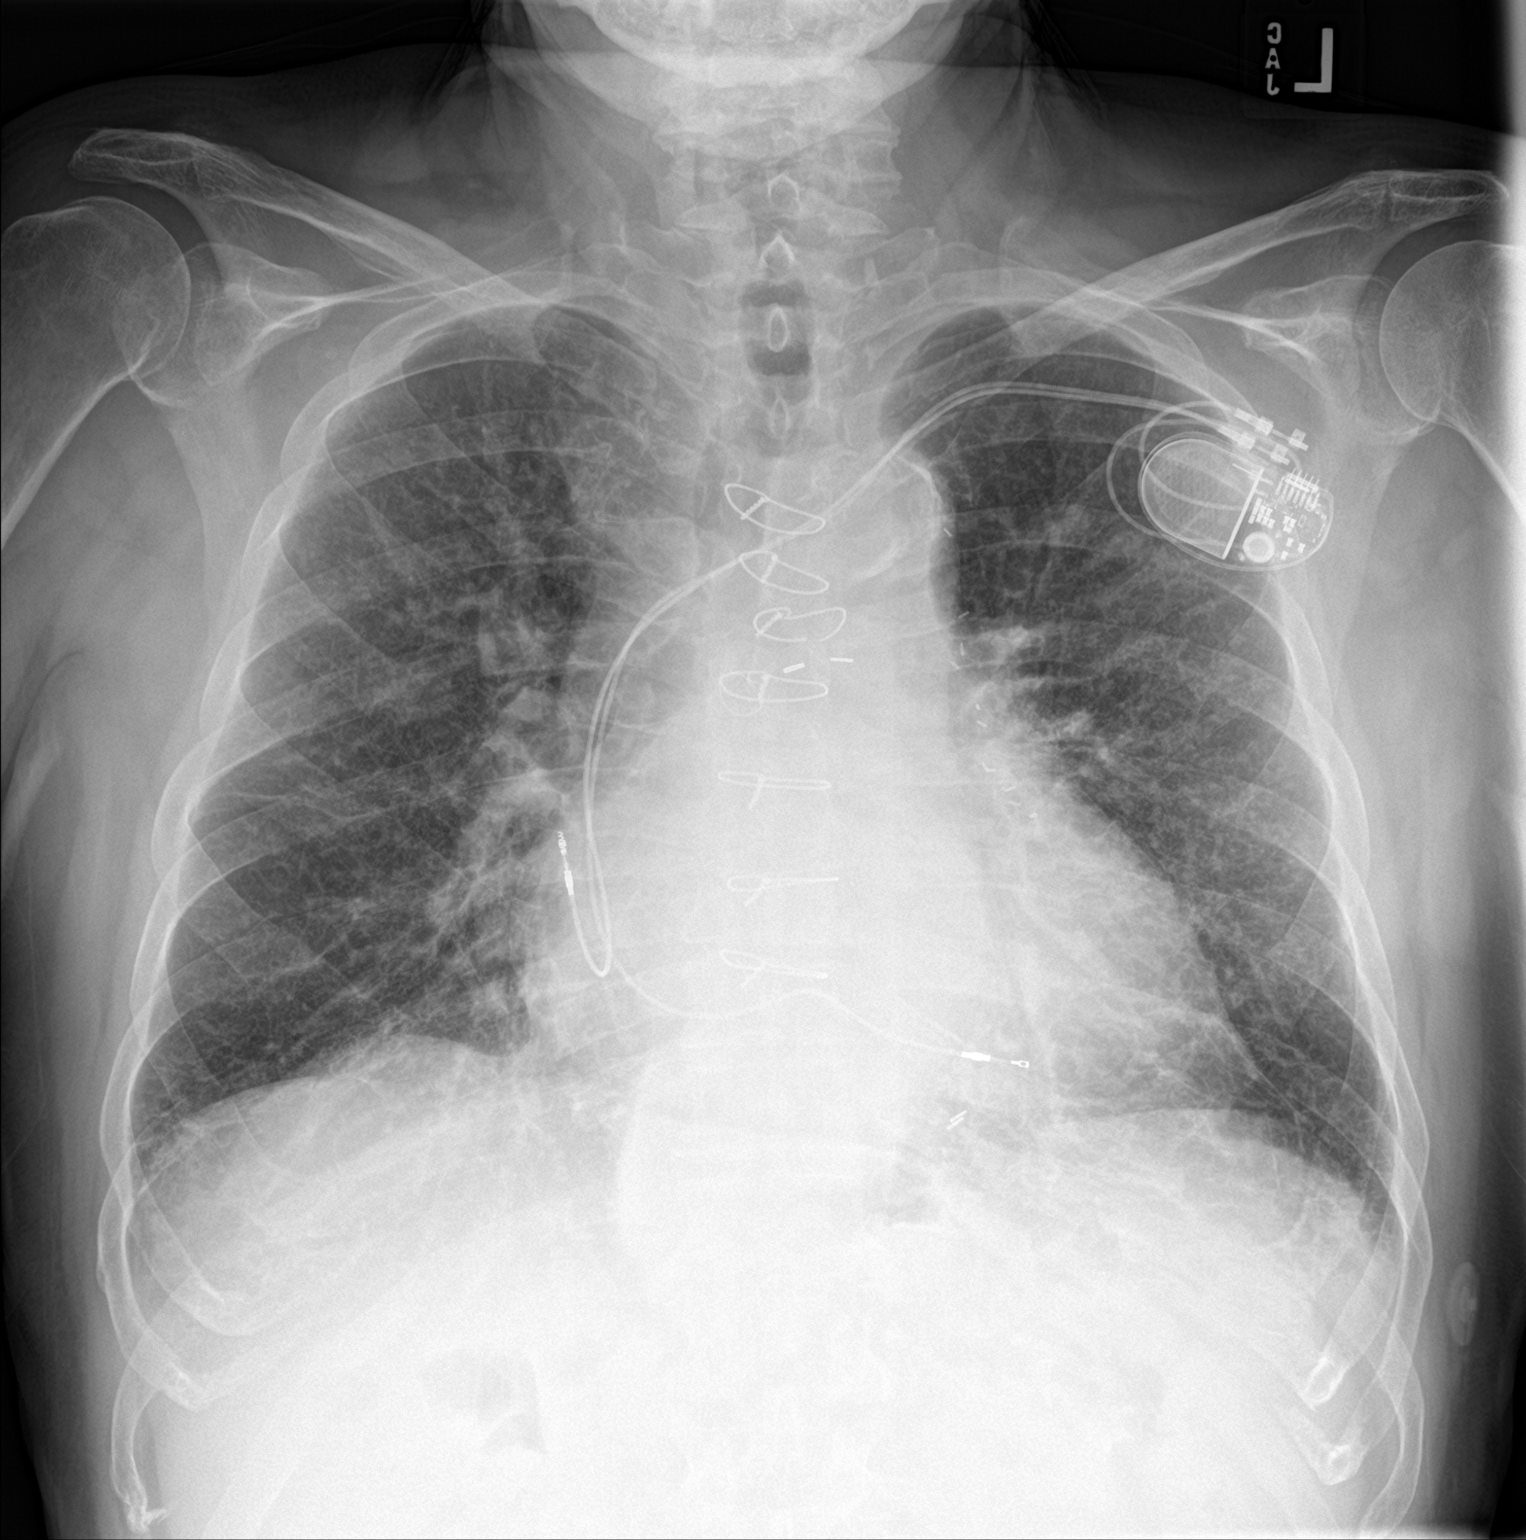

[chest lat]
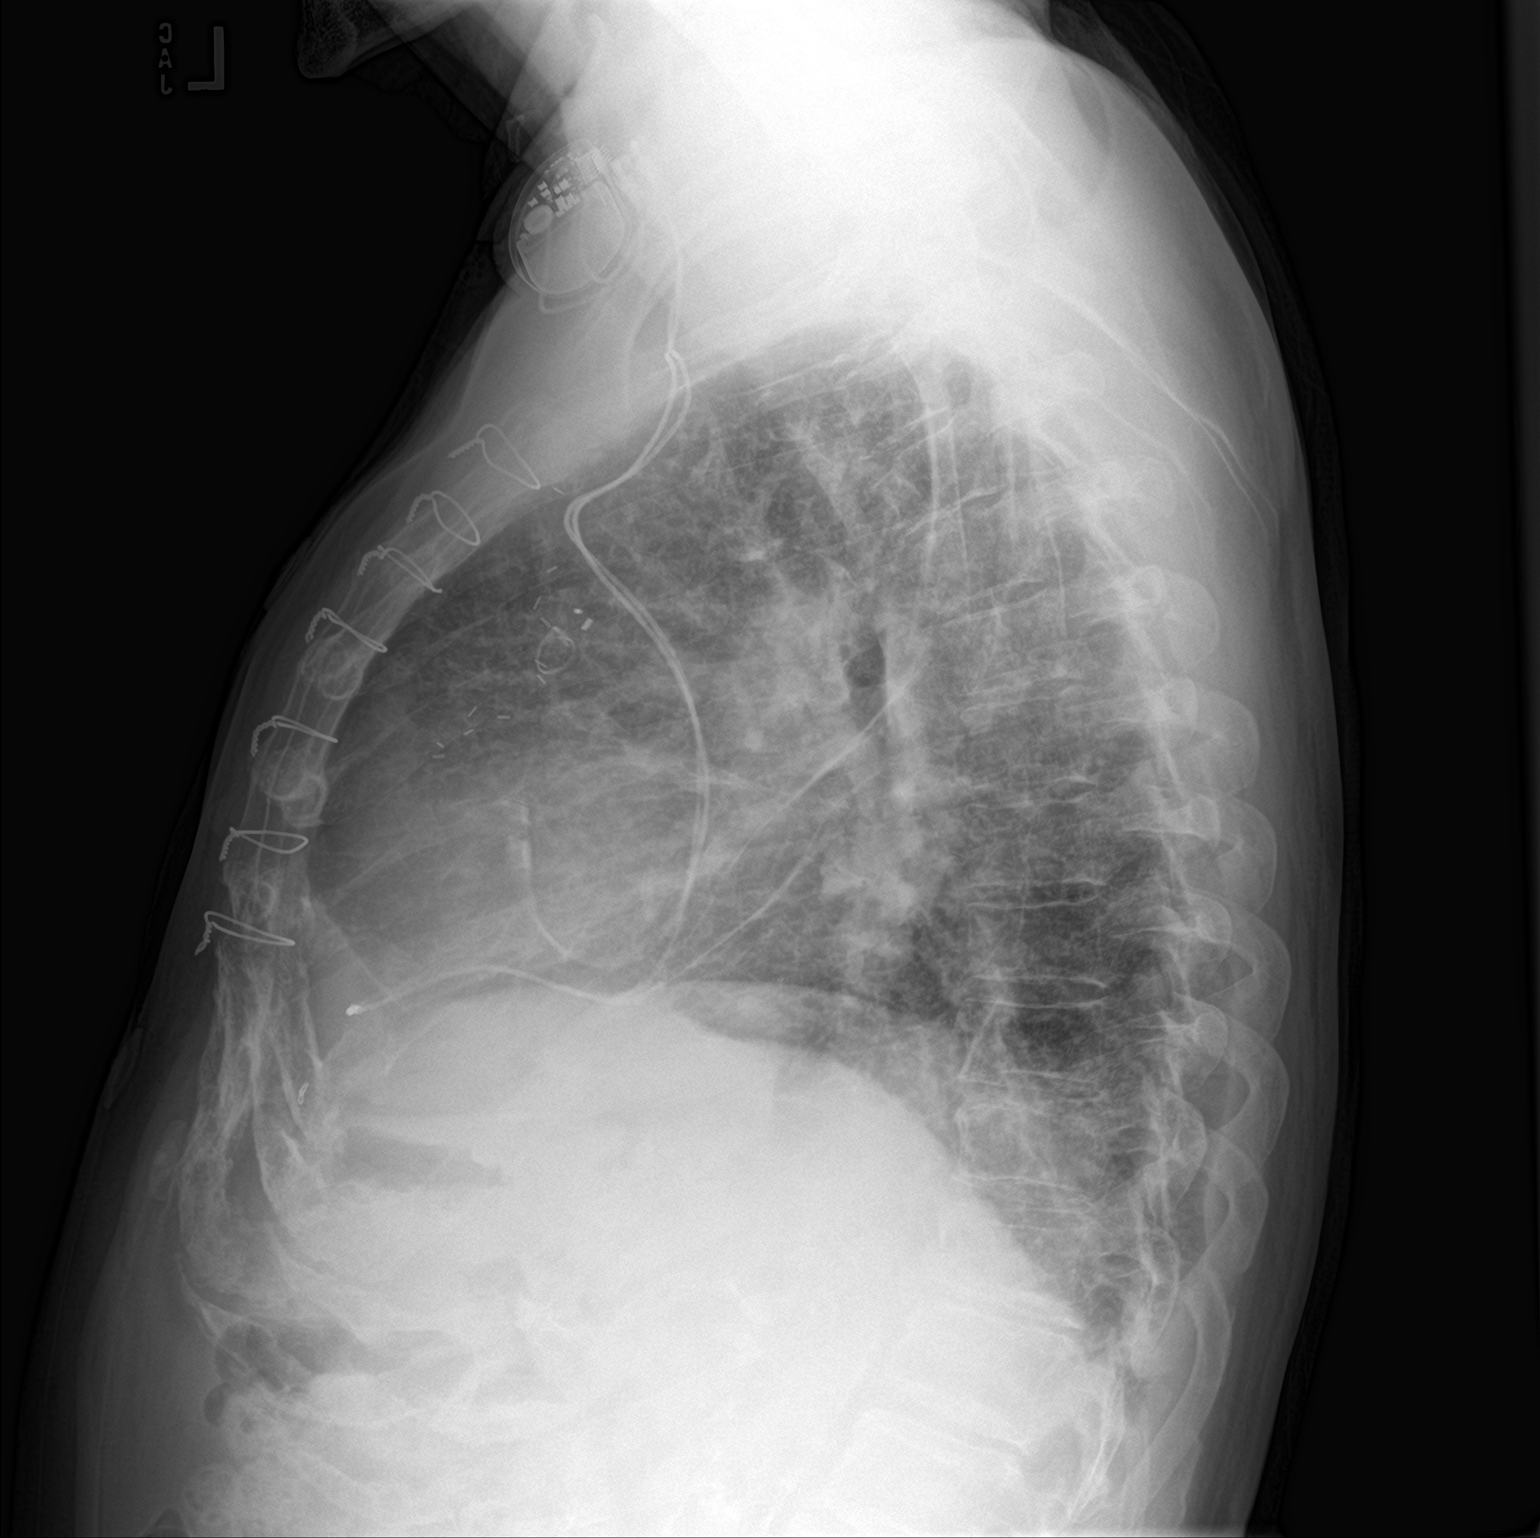

[2 of 2 positions shown; findings below may reference images not displayed]

FINDINGS: The heart size is enlarged. There is volume overload with mild
developing pulmonary edema. There is a dual chamber left-sided
pacemaker. The patient is status post prior median sternotomy.
Aortic calcifications are noted. There are degenerative changes
throughout the thoracic spine. There is no pneumothorax.
IMPRESSION: Cardiomegaly with vascular congestion and possible early developing
pulmonary edema.

## 2020-10-18 ENCOUNTER — Other Ambulatory Visit (HOSPITAL_COMMUNITY): Payer: Self-pay | Admitting: Cardiology

## 2020-10-18 ENCOUNTER — Other Ambulatory Visit: Payer: Self-pay | Admitting: Family Medicine

## 2020-11-04 ENCOUNTER — Ambulatory Visit (HOSPITAL_COMMUNITY): Admission: RE | Admit: 2020-11-04 | Payer: Medicare Other | Source: Ambulatory Visit

## 2020-11-04 ENCOUNTER — Encounter (HOSPITAL_COMMUNITY): Payer: Medicare Other | Admitting: Cardiology

## 2020-11-14 ENCOUNTER — Other Ambulatory Visit (HOSPITAL_COMMUNITY): Payer: Self-pay

## 2020-11-14 MED ORDER — APIXABAN 5 MG PO TABS: 5.0000 mg | ORAL_TABLET | Freq: Two times a day (BID) | ORAL | 0 refills | Status: DC

## 2020-11-22 ENCOUNTER — Ambulatory Visit (INDEPENDENT_AMBULATORY_CARE_PROVIDER_SITE_OTHER): Payer: Medicare Other

## 2020-11-22 DIAGNOSIS — I442 Atrioventricular block, complete: Secondary | ICD-10-CM | POA: Diagnosis not present

## 2020-11-22 LAB — CUP PACEART REMOTE DEVICE CHECK
Battery Remaining Longevity: 85 mo
Battery Remaining Percentage: 81 %
Battery Voltage: 2.99 V
Brady Statistic AP VP Percent: 33 %
Brady Statistic AP VS Percent: 1 %
Brady Statistic AS VP Percent: 62 %
Brady Statistic AS VS Percent: 1 %
Brady Statistic RA Percent Paced: 28 %
Date Time Interrogation Session: 20220823031304
Implantable Lead Implant Date: 20140717
Implantable Lead Implant Date: 20140717
Implantable Lead Implant Date: 20200825
Implantable Lead Location: 753858
Implantable Lead Location: 753859
Implantable Lead Location: 753860
Implantable Lead Model: 1948
Implantable Pulse Generator Implant Date: 20200825
Lead Channel Impedance Value: 1075 Ohm
Lead Channel Impedance Value: 430 Ohm
Lead Channel Impedance Value: 640 Ohm
Lead Channel Pacing Threshold Amplitude: 0.625 V
Lead Channel Pacing Threshold Amplitude: 1 V
Lead Channel Pacing Threshold Amplitude: 1 V
Lead Channel Pacing Threshold Pulse Width: 0.4 ms
Lead Channel Pacing Threshold Pulse Width: 0.4 ms
Lead Channel Pacing Threshold Pulse Width: 0.5 ms
Lead Channel Sensing Intrinsic Amplitude: 12 mV
Lead Channel Sensing Intrinsic Amplitude: 4.1 mV
Lead Channel Setting Pacing Amplitude: 2 V
Lead Channel Setting Pacing Amplitude: 2.25 V
Lead Channel Setting Pacing Amplitude: 2.5 V
Lead Channel Setting Pacing Pulse Width: 0.4 ms
Lead Channel Setting Pacing Pulse Width: 0.5 ms
Lead Channel Setting Sensing Sensitivity: 4 mV
Pulse Gen Model: 3562
Pulse Gen Serial Number: 9158588

## 2020-11-24 ENCOUNTER — Other Ambulatory Visit (HOSPITAL_COMMUNITY): Payer: Self-pay | Admitting: Cardiology

## 2020-11-29 ENCOUNTER — Other Ambulatory Visit: Payer: Self-pay | Admitting: Family Medicine

## 2020-12-01 ENCOUNTER — Encounter: Payer: Self-pay | Admitting: Family Medicine

## 2020-12-01 ENCOUNTER — Other Ambulatory Visit: Payer: Self-pay

## 2020-12-01 ENCOUNTER — Ambulatory Visit (INDEPENDENT_AMBULATORY_CARE_PROVIDER_SITE_OTHER): Payer: Medicare Other | Admitting: Family Medicine

## 2020-12-01 ENCOUNTER — Telehealth: Payer: Medicare Other | Admitting: Family Medicine

## 2020-12-01 VITALS — BP 138/77 | HR 67 | Temp 97.8°F | Ht 68.0 in | Wt 167.8 lb

## 2020-12-01 DIAGNOSIS — E1165 Type 2 diabetes mellitus with hyperglycemia: Secondary | ICD-10-CM | POA: Diagnosis not present

## 2020-12-01 DIAGNOSIS — N1831 Chronic kidney disease, stage 3a: Secondary | ICD-10-CM

## 2020-12-01 DIAGNOSIS — E782 Mixed hyperlipidemia: Secondary | ICD-10-CM | POA: Diagnosis not present

## 2020-12-01 DIAGNOSIS — Z794 Long term (current) use of insulin: Secondary | ICD-10-CM

## 2020-12-01 DIAGNOSIS — Z0000 Encounter for general adult medical examination without abnormal findings: Secondary | ICD-10-CM

## 2020-12-01 DIAGNOSIS — I10 Essential (primary) hypertension: Secondary | ICD-10-CM

## 2020-12-01 LAB — CBC WITH DIFFERENTIAL/PLATELET
Basophils Absolute: 0.1 10*3/uL (ref 0.0–0.2)
Basos: 1 %
EOS (ABSOLUTE): 0.3 10*3/uL (ref 0.0–0.4)
Eos: 5 %
Hematocrit: 48.3 % (ref 37.5–51.0)
Hemoglobin: 16.6 g/dL (ref 13.0–17.7)
Immature Grans (Abs): 0 10*3/uL (ref 0.0–0.1)
Immature Granulocytes: 0 %
Lymphocytes Absolute: 1 10*3/uL (ref 0.7–3.1)
Lymphs: 16 %
MCH: 32.9 pg (ref 26.6–33.0)
MCHC: 34.4 g/dL (ref 31.5–35.7)
MCV: 96 fL (ref 79–97)
Monocytes Absolute: 0.4 10*3/uL (ref 0.1–0.9)
Monocytes: 6 %
Neutrophils Absolute: 4.5 10*3/uL (ref 1.4–7.0)
Neutrophils: 72 %
Platelets: 166 10*3/uL (ref 150–450)
RBC: 5.04 x10E6/uL (ref 4.14–5.80)
RDW: 13.9 % (ref 11.6–15.4)
WBC: 6.3 10*3/uL (ref 3.4–10.8)

## 2020-12-01 LAB — CMP14+EGFR
ALT: 16 IU/L (ref 0–44)
AST: 20 IU/L (ref 0–40)
Albumin/Globulin Ratio: 4.2 — ABNORMAL HIGH (ref 1.2–2.2)
Albumin: 5 g/dL — ABNORMAL HIGH (ref 3.6–4.6)
Alkaline Phosphatase: 56 IU/L (ref 44–121)
BUN/Creatinine Ratio: 16 (ref 10–24)
BUN: 25 mg/dL (ref 8–27)
Bilirubin Total: 0.6 mg/dL (ref 0.0–1.2)
CO2: 24 mmol/L (ref 20–29)
Calcium: 9.7 mg/dL (ref 8.6–10.2)
Chloride: 100 mmol/L (ref 96–106)
Creatinine, Ser: 1.53 mg/dL — ABNORMAL HIGH (ref 0.76–1.27)
Globulin, Total: 1.2 g/dL — ABNORMAL LOW (ref 1.5–4.5)
Glucose: 148 mg/dL — ABNORMAL HIGH (ref 65–99)
Potassium: 4.3 mmol/L (ref 3.5–5.2)
Sodium: 140 mmol/L (ref 134–144)
Total Protein: 6.2 g/dL (ref 6.0–8.5)
eGFR: 45 mL/min/{1.73_m2} — ABNORMAL LOW (ref >59)

## 2020-12-01 LAB — LIPID PANEL
Chol/HDL Ratio: 4.7 ratio (ref 0.0–5.0)
Cholesterol, Total: 149 mg/dL (ref 100–199)
HDL: 32 mg/dL — ABNORMAL LOW (ref >39)
LDL Chol Calc (NIH): 78 mg/dL (ref 0–99)
Triglycerides: 236 mg/dL — ABNORMAL HIGH (ref 0–149)
VLDL Cholesterol Cal: 39 mg/dL (ref 5–40)

## 2020-12-01 LAB — BAYER DCA HB A1C WAIVED: HB A1C (BAYER DCA - WAIVED): 6 % (ref <7.0)

## 2020-12-01 MED ORDER — TRULICITY 0.75 MG/0.5ML ~~LOC~~ SOAJ: 0.7500 mg | SUBCUTANEOUS | 2 refills | Status: DC

## 2020-12-01 NOTE — Progress Notes (Signed)
Assessment & Plan:  1. Type 2 diabetes mellitus with hyperglycemia, with long-term current use of insulin (HCC) Lab Results  Component Value Date   HGBA1C 6.0 12/01/2020   HGBA1C 6.3 09/10/2019   HGBA1C >14.0 (H) 05/28/2019    - Diabetes is at goal of A1c < 7. - Medications:  switch from lantus to once weekly trulicity, continue [place].  Patient to see our clinical pharmacist on 12/22/2020 to assist with prescription assistance for Trulicity. - Home glucose monitoring: continue to monitor. Declined freestyle libre. - Patient is currently taking a statin. Patient is taking an ACE-inhibitor/ARB.  - Instruction/counseling given: discussed foot care  Diabetes Health Maintenance Due  Topic Date Due   OPHTHALMOLOGY EXAM  Never done   HEMOGLOBIN A1C  03/02/2021   FOOT EXAM  12/01/2021   - encouraged to continue healthy diet and exercise - Lipid panel - CBC with Differential/Platelet - CMP14+EGFR - Bayer DCA Hb A1c Waived, 6 today - Dulaglutide (TRULICITY) 2.77 AJ/2.8NO SOPN; Inject 0.75 mg into the skin once a week.  Dispense: 2 mL; Refill: 2  2. Essential hypertension - continue current medications as prescribed by cardiology, currently Coreg, Lasix, Entresto, and Spironolactone - encouraged to continue healthy diet and exercise - Lipid panel - CBC with Differential/Platelet - CMP14+EGFR  3. Stage 3a chronic kidney disease ([place]) Continue Farxiga.  - CMP14+EGFR  4. Mixed hyperlipidemia Labs to assess. - Lipid panel - CMP14+EGFR  5. Healthcare maintenance - foot exam today, recommended podiatry referral, patient declined   Daughter, Ricardo Hunt, called and made aware of changes from today.  Return in about 6 weeks (around 01/12/2021) for DM.  Lucile Crater, NP Student  I personally was present during the history, physical exam, and medical decision-making activities of this service and have verified that the service and findings are accurately documented in the nurse  practitioner student's note.  Hendricks Limes, MSN, APRN, FNP-C [facility]   Subjective:    Patient ID: Ricardo Hunt, male    DOB: April 04, 1940, 80 y.o.   MRN: [id number]  Patient Care Team: Loman Brooklyn, FNP as PCP - General (Family Medicine) Thompson Grayer, MD as Consulting Physician (Cardiology) Herminio Commons, MD (Inactive) as Consulting Physician (Cardiology) Larey Dresser, MD as Consulting Physician (Cardiology)   Chief Complaint:  Chief Complaint  Patient presents with   Diabetes    Check up of chronic medical conditions     HPI: Ricardo Hunt is a 80 y.o. male presenting on 12/01/2020 for Diabetes (Check up of chronic medical conditions )  Diabetes: Patient presents for follow up of diabetes. Current symptoms include: none. Known diabetic complications: cardiovascular disease. Medication compliance: taking Lantus at night, was started on farxiga by his cardiologist. Current diet: in general, an "unhealthy" diet. Current exercise: yard work. Home blood sugar records:  postprandial BG 150-200 . Is he  on ACE inhibitor or angiotensin II receptor blocker? Yes. Is he on a statin? Yes. Denies decreased sensation or tingling/pins and needles in his feet, states he does not have a podiatrist but has seen one in the past. He is not interested in [place].  New complaints: None  Social history:  Relevant past medical, surgical, family and social history reviewed and updated as indicated. Interim medical history since our last visit reviewed.  Allergies and medications reviewed and updated.  DATA REVIEWED: CHART IN EPIC  ROS: Negative unless specifically indicated above in HPI.    Current Outpatient Medications:  Dulaglutide (TRULICITY) 2.24 MG/5.0IB SOPN, Inject 0.75 mg into the skin once a week., Disp: 2 mL, Rfl: 2   ACCU-CHEK GUIDE test strip, CHECK BS FOUR TIMES A DAY BEFORE MEALS AT BEDTIME Dx E11.65, Disp: 100 each, Rfl: 12    Accu-Chek Softclix Lancets lancets, CHECK BS FOUR TIMES A DAY BEFORE MEALS AT BEDTIME Dx E11.65, Disp: 100 each, Rfl: 12   apixaban (ELIQUIS) 5 MG TABS tablet, Take 1 tablet (5 mg total) by mouth 2 (two) times daily., Disp: 56 tablet, Rfl: 0   carvedilol (COREG) 6.25 MG tablet, TAKE 1 TABLET 2 TIMES A DAY WITH A MEAL, Disp: 180 tablet, Rfl: 2   dapagliflozin propanediol (FARXIGA) 10 MG TABS tablet, Take 1 tablet (10 mg total) by mouth daily before breakfast., Disp: 30 tablet, Rfl: 11   digoxin (LANOXIN) 0.125 MG tablet, TAKE 1 TABLET DAILY, Disp: 30 tablet, Rfl: 2   ENTRESTO 24-26 MG, TAKE (1) TABLET TWICE A DAY., Disp: 60 tablet, Rfl: 6   furosemide (LASIX) 40 MG tablet, TAKE 1 TABLET EVERY MORNING AND 1/2 EVERY AFTERNOON, Disp: 45 tablet, Rfl: 6   Insulin Pen Needle (TRUEPLUS PEN NEEDLES) 31G X 5 MM MISC, Use five times daily with insulin Dx E11.65, Disp: 500 each, Rfl: 3   rosuvastatin (CRESTOR) 20 MG tablet, TAKE 1 TABLET AT 6PM, Disp: 30 tablet, Rfl: 2   spironolactone (ALDACTONE) 25 MG tablet, TAKE 1 TABLET DAILY, Disp: 30 tablet, Rfl: 3   No Known Allergies Past Medical History:  Diagnosis Date   Arthritis    " all over the body"   Atrial fibrillation and flutter (HCC)    seen on PPM interrogation; Rivaroxaban started 10/486   Chronic systolic CHF (congestive heart failure) ([place])    Echo 05/2018: EF 20-25, severe LVE, diffuse HK with septal and apical akinesis, moderate LAE, moderate MAC, severe aortic valve calcification - degree of AS diff to judge with low EF (likely mild to mod)   Coronary artery disease    s/p CABG 1996 // LHC 05/2018: oLAD 90, pLAD 100 after Dx, LCx 90 at bifurcation of OM1/OM2, mRCA 50; L-LAD ok; S-D1/OM2 100 >> PCI: DES to mLCx and orbital atherectomy and DES to oLAD   Diabetes mellitus without complication (HCC)    GERD (gastroesophageal reflux disease)    Hyperlipidemia    Hypertension    Mobitz (type) II atrioventricular block    s/p STJ Accent pacemaker  implanted by Dr [name] 10-16-2012   Myocardial infarction [facility])    per pt., told that that the stress test shows that there is a weakening evident on the stress test   Pacemaker 10/16/2012   St. Jude    Personal history of colonic polyps-tubular adenomas 08/31/2013   Thrombocytopenia ([place]) 10/03/2012   Vertigo     Past Surgical History:  Procedure Laterality Date   BIV UPGRADE N/A 11/25/2018   Procedure: BIV UPGRADE;  Surgeon: Thompson Grayer, MD;  Location: [place] CV LAB;  Service: Cardiovascular;  Laterality: N/A;   cardiac bypass  1996   3 vessels   CATARACT EXTRACTION W/ INTRAOCULAR LENS  IMPLANT, BILATERAL Bilateral 2010   COLONOSCOPY     CORONARY ARTERY BYPASS GRAFT     CORONARY ATHERECTOMY N/A 05/23/2018   Procedure: CORONARY ATHERECTOMY;  Surgeon: [name], Peter M, MD;  Location: [place] [facility];  Service: Cardiovascular;  Laterality: N/A;   CORONARY STENT INTERVENTION N/A 05/23/2018   Procedure: CORONARY STENT INTERVENTION;  Surgeon: [name], Peter M, MD;  Location:  [place] INVASIVE CV LAB;  Service: Cardiovascular;  Laterality: N/A;   INGUINAL HERNIA REPAIR Left 2010   LUMBAR LAMINECTOMY/DECOMPRESSION MICRODISCECTOMY Right 07/19/2016   Procedure: Right Lumbar Four-Five Hemilaminectomy and Bilateral Lumbar Two-Three Laminectomy;  Surgeon: Eustace Moore, MD;  Location: [place];  Service: Neurosurgery;  Laterality: Right;  Right Lumbar Four-Five Hemilaminectomy and Bilateral Lumbar Two-Three Laminectomy   LUMBAR SPINE SURGERY  2006   NASAL SEPTUM SURGERY  09/01/13   Dr. Adriana Reams   PACEMAKER INSERTION  10-16-2012   STJ Accent dual chamber pacemaker implanted by Dr [name] for symptomatic Mobitz II heart block   PERMANENT PACEMAKER INSERTION N/A 10/16/2012   Procedure: PERMANENT PACEMAKER INSERTION;  Surgeon: Thompson Grayer, MD;  Location: [facility] CATH LAB;  Service: Cardiovascular;  Laterality: N/A;   RIGHT/LEFT HEART CATH AND CORONARY/GRAFT ANGIOGRAPHY N/A 05/20/2018   Procedure: RIGHT/LEFT HEART  CATH AND CORONARY/GRAFT ANGIOGRAPHY;  Surgeon: [name], Peter M, MD;  Location: [place] CV LAB;  Service: Cardiovascular;  Laterality: N/A;    Social History   Socioeconomic History   Marital status: Widowed    Spouse name: Not on file   Number of children: Not on file   Years of education: Not on file   Highest education level: Not on file  Occupational History   Not on file  Tobacco Use   Smoking status: Former    Packs/day: 2.00    Years: 40.00    Pack years: 80.00    Types: Cigarettes    Quit date: 04/02/1994    Years since quitting: 26.6   Smokeless tobacco: Never  Vaping Use   Vaping Use: Never used  Substance and Sexual Activity   Alcohol use: Not Currently    Alcohol/week: 0.0 standard drinks   Drug use: No   Sexual activity: Not Currently  Other Topics Concern   Not on file  Social History Narrative   Not on file   Social Determinants of Health   Financial Resource Strain: Not on file  Food Insecurity: Not on file  Transportation Needs: Not on file  Physical Activity: Not on file  Stress: Not on file  Social Connections: Not on file  Intimate Partner Violence: Not on file        Objective:    BP 138/77   Pulse 67   Temp 97.8 F (36.6 C) (Temporal)   Ht _0  (1.727 m)   Wt 76.1 kg   SpO2 94%   BMI 25.51 kg/m   Wt Readings from Last 3 Encounters:  12/01/20 76.1 kg  08/04/20 75.2 kg  08/04/20 75.2 kg    Physical Exam Vitals reviewed.  Constitutional:      General: He is not in acute distress.    Appearance: Normal appearance. He is normal weight. He is not ill-appearing, toxic-appearing or diaphoretic.  HENT:     Head: Normocephalic and atraumatic.  Eyes:     General: No scleral icterus.       Right eye: No discharge.        Left eye: No discharge.     Conjunctiva/sclera: Conjunctivae normal.  Cardiovascular:     Rate and Rhythm: Normal rate and regular rhythm.     Pulses:          Radial pulses are 2+ on the right side and 2+ on  the left side.       Dorsalis pedis pulses are 1+ on the right side and 1+ on the left side.  Posterior tibial pulses are 1+ on the right side and 1+ on the left side.     Heart sounds: Normal heart sounds. No murmur heard.   No friction rub. No gallop.  Pulmonary:     Effort: Pulmonary effort is normal. No respiratory distress.     Breath sounds: Normal breath sounds. No stridor. No wheezing, rhonchi or rales.  Musculoskeletal:        General: Normal range of motion.     Cervical back: Normal range of motion.  Skin:    General: Skin is warm and dry.     Coloration: Skin is mottled (bilateral lower extremities to dependency).  Neurological:     Mental Status: He is alert and oriented to person, place, and time. Mental status is at baseline.  Psychiatric:        Mood and Affect: Mood normal.        Behavior: Behavior normal.        Thought Content: Thought content normal.        Judgment: Judgment normal.   Diabetic Foot Exam - Simple   Simple Foot Form Diabetic Foot exam was performed with the following findings: Yes 12/01/2020 10:00 AM  Visual Inspection No deformities, no ulcerations, no other skin breakdown bilaterally: Yes Sensation Testing Intact to touch and monofilament testing bilaterally: Yes Pulse Check Posterior Tibialis and Dorsalis pulse intact bilaterally: Yes Comments     Lab Results  Component Value Date   TSH 3.550 08/04/2020   Lab Results  Component Value Date   WBC 7.3 08/04/2020   HGB 15.8 08/04/2020   HCT 47.8 08/04/2020   MCV 99.0 08/04/2020   PLT 160 08/04/2020   Lab Results  Component Value Date   NA 140 08/04/2020   K 4.3 08/04/2020   CO2 27 08/04/2020   GLUCOSE 96 08/04/2020   BUN 22 08/04/2020   CREATININE 1.32 (H) 08/04/2020   BILITOT 0.8 08/04/2020   ALKPHOS 52 08/04/2020   AST 19 08/04/2020   ALT 17 08/04/2020   PROT 5.9 (L) 08/04/2020   ALBUMIN 4.0 08/04/2020   CALCIUM 9.2 08/04/2020   ANIONGAP 7 08/04/2020   Lab  Results  Component Value Date   CHOL 131 08/04/2020   Lab Results  Component Value Date   HDL 30 (L) 08/04/2020   Lab Results  Component Value Date   LDLCALC 63 08/04/2020   Lab Results  Component Value Date   TRIG 190 (H) 08/04/2020   Lab Results  Component Value Date   CHOLHDL 4.4 08/04/2020   Lab Results  Component Value Date   HGBA1C 6.0 12/01/2020

## 2020-12-01 NOTE — Telephone Encounter (Signed)
Lmtcb.

## 2020-12-01 NOTE — Telephone Encounter (Signed)
Please ask patient to continue Lantus until his appointment with Almyra Free on 12/22/20 to get prescription assistance with Trulicity, then he can switch over. I did make his daughter, Ricardo Hunt, aware of this. Does he need a refill of Lantus?

## 2020-12-01 NOTE — Patient Instructions (Signed)
Stop Lantus daily. Start Trulicity once weekly.  You have an appointment in this office with our clinical pharmacist on 12/22/2020 at 10:30 AM.  Please schedule your diabetic eye exam or let me know if you would like to be set up in our office for diabetic eye imaging.  Reminder: please return after mid-September for your flu shot. If you receive this elsewhere, such as your pharmacy, please let us know so we can get this documented in your chart.

## 2020-12-02 NOTE — Progress Notes (Signed)
Daughter calling about labs - please call back [phone]

## 2020-12-07 NOTE — Progress Notes (Signed)
Remote pacemaker transmission.   

## 2020-12-09 NOTE — Telephone Encounter (Signed)
Patient did pick up Trulicity. Patient has not taken yet because he was told to continue lantus until he seen Almyra Free. Daughter notified that we were told that the Trulicity was too expensive and that they did not get. Patient daughter was given instructions on how to administer Trulicity and he will start the 1st one today. Patient daughter advised to call us if they have any questions or concerns.

## 2020-12-22 ENCOUNTER — Ambulatory Visit (INDEPENDENT_AMBULATORY_CARE_PROVIDER_SITE_OTHER): Payer: Medicare Other | Admitting: Pharmacist

## 2020-12-22 ENCOUNTER — Other Ambulatory Visit: Payer: Self-pay

## 2020-12-22 DIAGNOSIS — Z794 Long term (current) use of insulin: Secondary | ICD-10-CM

## 2020-12-22 DIAGNOSIS — E1165 Type 2 diabetes mellitus with hyperglycemia: Secondary | ICD-10-CM

## 2020-12-22 MED ORDER — TRULICITY 0.75 MG/0.5ML ~~LOC~~ SOAJ: 0.7500 mg | SUBCUTANEOUS | 2 refills | Status: DC

## 2020-12-22 NOTE — Progress Notes (Signed)
Chronic Care Management Pharmacy Note  12/22/2020 Name:  Ricardo Hunt MRN:  [id number] DOB:  [birth year]  Summary: T2DM  Recommendations/Changes made from today's visit: Diabetes: Controlled-A1C 6.0%, GFR 45; current treatment:FARXIGA;  STARTING TRULICITY 9.75OI SQ WEEKLY CONTINUE FARXIGA Denies personal and family history of Medullary thyroid cancer ([place]) WILL COMPLETE PATIENT ASSISTANCE VIA [employer] DAUGHTER WORK NUMBER [phone] WILL HAVE TRULICITY SHIPPED HERE--PT NOT HOME A LOT NEED TO CONTACT DAUGHTER ([place] PHONE) 1 MONTH VOUCHER GIVEN FOR PATIENT TO TAKE TO LOCAL PHARMACY AND FILL Current glucose readings: fasting glucose: <135, post prandial glucose: N/A Denies hypoglycemic/hyperglycemic symptoms Discussed meal planning options and Plate method for healthy eating Avoid sugary drinks and desserts Incorporate balanced protein, non starchy veggies, 1 serving of carbohydrate with each meal Increase water intake Increase physical activity as able Current exercise: N/A Recommended CONTINUE SGLT2 (FARXIGA) & TRULICITY (GLP1) Assessed patient finances. WILL SUBMIT [employer] APPLICATION FOR TRULICITY  Follow Up Plan: Telephone follow up appointment with care management team member scheduled for: 4 WEEKS   Subjective: Ricardo Hunt is an 80 y.o. year old male who is a primary patient of Loman Brooklyn, FNP.  The CCM team was consulted for assistance with disease management and care coordination needs.    Engaged with patient face to face for initial visit in response to provider referral for pharmacy case management and/or care coordination services.   Consent to Services:  The patient was given information about Chronic Care Management services, agreed to services, and gave verbal consent prior to initiation of services.  Please see initial visit note for detailed documentation.   Patient Care Team: Loman Brooklyn, FNP as PCP - General  (Family Medicine) Thompson Grayer, MD as Consulting Physician (Cardiology) Herminio Commons, MD (Inactive) as Consulting Physician (Cardiology) Larey Dresser, MD as Consulting Physician (Cardiology) Lavera Guise, [facility] as Pharmacist (Family Medicine)  Objective:  Lab Results  Component Value Date   CREATININE 1.53 (H) 12/01/2020   CREATININE 1.32 (H) 08/04/2020   CREATININE 1.26 (H) 02/15/2020    Lab Results  Component Value Date   HGBA1C 6.0 12/01/2020   Last diabetic Eye exam: No results found for: HMDIABEYEEXA  Last diabetic Foot exam: No results found for: HMDIABFOOTEX      Component Value Date/Time   CHOL 149 12/01/2020 0904   TRIG 236 (H) 12/01/2020 0904   HDL 32 (L) 12/01/2020 0904   CHOLHDL 4.7 12/01/2020 0904   CHOLHDL 4.4 08/04/2020 0904   VLDL 38 08/04/2020 0904   LDLCALC 78 12/01/2020 0904    Hepatic Function Latest Ref Rng & Units 12/01/2020 08/04/2020 01/12/2007  Total Protein 6.0 - 8.5 g/dL 6.2 5.9(L) 6.1  Albumin 3.6 - 4.6 g/dL 5.0(H) 4.0 3.7  AST 0 - 40 IU/L _0 ALT 0 - 44 IU/L _1 Alk Phosphatase 44 - 121 IU/L 56 52 49  Total Bilirubin 0.0 - 1.2 mg/dL 0.6 0.8 0.7    Lab Results  Component Value Date/Time   TSH 3.550 08/04/2020 09:04 AM   TSH 1.104 10/03/2012 10:18 PM   FREET4 1.16 10/04/2012 10:45 AM    CBC Latest Ref Rng & Units 12/01/2020 08/04/2020 09/07/2019  WBC 3.4 - 10.8 x10E3/uL 6.3 7.3 7.1  Hemoglobin 13.0 - 17.7 g/dL 16.6 15.8 15.5  Hematocrit 37.5 - 51.0 % 48.3 47.8 47.8  Platelets 150 - 450 x10E3/uL 166 160 179    No results found for:  VD25OH  Clinical ASCVD: No  The ASCVD Risk score (Arnett DK, et al., 2019) failed to calculate for the following reasons:   The 2019 ASCVD risk score is only valid for ages 41 to 68    Other: (CHADS2VASc if Afib, PHQ9 if depression, MMRC or CAT for COPD, ACT, DEXA)  Social History   Tobacco Use  Smoking Status Former   Packs/day: 2.00   Years: 40.00   Pack years: 80.00   Types:  Cigarettes   Quit date: 04/02/1994   Years since quitting: 26.7  Smokeless Tobacco Never   BP Readings from Last 3 Encounters:  12/01/20 138/77  08/04/20 132/72  08/04/20 132/72   Pulse Readings from Last 3 Encounters:  12/01/20 67  08/04/20 60  08/04/20 60   Wt Readings from Last 3 Encounters:  12/01/20 167 lb 12.8 oz (76.1 kg)  08/04/20 165 lb 12.6 oz (75.2 kg)  08/04/20 165 lb 12.8 oz (75.2 kg)    Assessment: Review of patient past medical history, allergies, medications, health status, including review of consultants reports, laboratory and other test data, was performed as part of comprehensive evaluation and provision of chronic care management services.   SDOH:  (Social Determinants of Health) assessments and interventions performed:    CCM Care Plan  No Known Allergies  Medications Reviewed Today     Reviewed by Lavera Guise, [facility] (Pharmacist) on 12/22/20 at [place] List Status: <None>   Medication Order Taking? Sig Documenting Provider Last Dose Status Informant  ACCU-CHEK GUIDE test strip [id number] No CHECK BS FOUR TIMES A DAY BEFORE MEALS AT BEDTIME Dx E11.65 Loman Brooklyn, FNP Taking Active   Accu-Chek Softclix Lancets lancets [id number] No CHECK BS FOUR TIMES A DAY BEFORE MEALS AT BEDTIME Dx E11.65 Loman Brooklyn, FNP Taking Active   apixaban (ELIQUIS) 5 MG TABS tablet [id number]  Take 1 tablet (5 mg total) by mouth 2 (two) times daily. Sherran Needs, NP  Active   carvedilol (COREG) 6.25 MG tablet [id number] No TAKE 1 TABLET 2 TIMES A DAY WITH A MEAL Verta Ellen., NP Taking Active   dapagliflozin propanediol (FARXIGA) 10 MG TABS tablet [id number]  Take 1 tablet (10 mg total) by mouth daily before breakfast. Larey Dresser, MD  Active   digoxin (LANOXIN) 0.125 MG tablet [id number]  TAKE 1 TABLET DAILY Verta Ellen., NP  Active   Dulaglutide (TRULICITY) 8.12 XN/1.7GY [facility] [id number]  Inject 0.75 mg into the skin once a week. Loman Brooklyn, FNP   Active   ENTRESTO 24-26 MG [id number]  TAKE (1) TABLET TWICE A DAY. Larey Dresser, MD  Active   furosemide (LASIX) 40 MG tablet [id number]  TAKE 1 TABLET EVERY MORNING AND 1/2 EVERY AFTERNOON Larey Dresser, MD  Active   Insulin Pen Needle (TRUEPLUS PEN NEEDLES) 31G X 5 MM MISC [id number] No Use five times daily with insulin Dx E11.65 Loman Brooklyn, FNP Taking Active   rosuvastatin (CRESTOR) 20 MG tablet [id number]  TAKE 1 TABLET AT Dione Housekeeper., NP  Active   spironolactone (ALDACTONE) 25 MG tablet [id number]  TAKE 1 TABLET DAILY Clegg, Amy D, NP  Active             Patient Active Problem List   Diagnosis Date Noted   Type 2 diabetes mellitus with hyperglycemia, with long-term current use of insulin (HCC) 06/07/2019   GERD (gastroesophageal reflux disease)    Atrial fibrillation and flutter (  [place])    CKD (chronic kidney disease), stage III (HCC)    Chronic systolic CHF (congestive heart failure) ([place]) 05/20/2018   S/P lumbar laminectomy 07/19/2016   Personal history of colonic polyps-tubular adenomas 08/31/2013   Complete heart block (HCC) s/p Pacemaker in 2014 10/15/2012   S/P CABG (coronary artery bypass graft) 10/06/2012   Coronary artery disease involving native coronary artery of native heart with angina pectoris ([place]) 10/03/2012   Essential hypertension 10/03/2012   Hyperlipidemia 10/03/2012    Immunization History  Administered Date(s) Administered   Influenza, High Dose Seasonal PF 01/30/2017, 02/18/2018   Influenza,inj,Quad PF,6+ Mos 01/30/2017   Influenza,inj,quad, With Preservative 12/30/2018   Influenza-Unspecified 01/31/2018, 01/20/2020   Pneumococcal Conjugate-13 03/22/2017   Pneumococcal Polysaccharide-23 06/22/2015   Tdap 06/23/2014   Zoster, Live 01/20/2013    Conditions to be addressed/monitored: DMII  Care Plan : PHARMD MEDICATION MANAGEMENT  Updates made by Lavera Guise, [place] since 12/28/2020 12:00 AM     Problem: DISEASE PROGRESSION  PREVENTION      Long-Range Goal: T2DM   This Visit's Progress: On track  Priority: High  Note:   Current Barriers:  Unable to independently afford treatment regimen Suboptimal therapeutic regimen for T2DM  Pharmacist Clinical Goal(s):  Over the next 90 days, patient will verbalize ability to afford treatment regimen maintain control of T2DM as evidenced by [place], ADDITIONAL HEALTH BENEFITS/PROTECTION  through collaboration with PharmD and provider.    Interventions: 1:1 collaboration with Loman Brooklyn, FNP regarding development and update of comprehensive plan of care as evidenced by provider attestation and co-signature Inter-disciplinary care team collaboration (see longitudinal plan of care) Comprehensive medication review performed; medication list updated in electronic medical record  Diabetes: Controlled-A1C 6.0%, GFR 45; current treatment:FARXIGA;  STARTING TRULICITY 4.92EF SQ WEEKLY CONTINUE FARXIGA Denies personal and family history of Medullary thyroid cancer ([place]) WILL COMPLETE PATIENT ASSISTANCE VIA [employer] DAUGHTER WORK NUMBER [phone] WILL HAVE TRULICITY SHIPPED HERE--PT NOT HOME A LOT NEED TO CONTACT DAUGHTER ([place] PHONE) 1 MONTH VOUCHER GIVEN FOR PATIENT TO TAKE TO LOCAL PHARMACY AND FILL Current glucose readings: fasting glucose: <135, post prandial glucose: N/A Denies hypoglycemic/hyperglycemic symptoms Discussed meal planning options and Plate method for healthy eating Avoid sugary drinks and desserts Incorporate balanced protein, non starchy veggies, 1 serving of carbohydrate with each meal Increase water intake Increase physical activity as able Current exercise: N/A Recommended CONTINUE SGLT2 (FARXIGA) & TRULICITY (GLP1) Assessed patient finances. WILL SUBMIT [place]   Patient Goals/Self-Care Activities Over the next 90 days, patient will:  - take medications as  prescribed collaborate with provider on medication access solutions  Follow Up Plan: Telephone follow up appointment with care management team member scheduled for: 4 WEEKS      Medication Assistance: Application for [employer]/TRULICITY  medication assistance program. in process.  Anticipated assistance start date TBD.  See plan of care for additional detail.  Patient's preferred pharmacy is:  [facility], [address] [place] [address] Phone: [phone] Fax: [phone]  [facility]  ([place], [place] [facility] [address] [place] [address] [place] [address] Phone: [phone] Fax: [phone]  [facility] [place], [place] - [address] [place] [address] Phone: [phone] Fax: [phone]   Follow Up:  Patient  agrees to Care Plan and Follow-up.  Plan: Telephone follow up appointment with care management team member scheduled for:  4-6 WEEKS  Regina Eck, PharmD, BCPS Clinical Pharmacist, [place]  [place] Phone [phone]

## 2020-12-28 NOTE — Patient Instructions (Signed)
Visit Information  PATIENT GOALS:  Goals Addressed               This Visit's Progress     Patient Stated     T2DM PHARMD GOAL (pt-stated)        Current Barriers:  Unable to independently afford treatment regimen Suboptimal therapeutic regimen for T2DM  Pharmacist Clinical Goal(s):  Over the next 90 days, patient will verbalize ability to afford treatment regimen maintain control of T2DM as evidenced by [place], ADDITIONAL HEALTH BENEFITS/PROTECTION  through collaboration with PharmD and provider.    Interventions: 1:1 collaboration with Loman Brooklyn, FNP regarding development and update of comprehensive plan of care as evidenced by provider attestation and co-signature Inter-disciplinary care team collaboration (see longitudinal plan of care) Comprehensive medication review performed; medication list updated in electronic medical record  Diabetes: Controlled-A1C 6.0%, GFR 45; current treatment:FARXIGA;  STARTING TRULICITY 0.75MG  SQ WEEKLY CONTINUE FARXIGA Denies personal and family history of Medullary thyroid cancer ([place]) WILL COMPLETE PATIENT ASSISTANCE VIA [employer] DAUGHTER WORK NUMBER [phone] WILL HAVE TRULICITY SHIPPED HERE--PT NOT HOME A LOT NEED TO CONTACT DAUGHTER ([place] PHONE) 1 MONTH VOUCHER GIVEN FOR PATIENT TO TAKE TO LOCAL PHARMACY AND FILL Current glucose readings: fasting glucose: <135, post prandial glucose: N/A Denies hypoglycemic/hyperglycemic symptoms Discussed meal planning options and Plate method for healthy eating Avoid sugary drinks and desserts Incorporate balanced protein, non starchy veggies, 1 serving of carbohydrate with each meal Increase water intake Increase physical activity as able Current exercise: N/A Recommended CONTINUE SGLT2 (FARXIGA) & TRULICITY (GLP1) Assessed patient finances. WILL SUBMIT [place]   Patient Goals/Self-Care Activities Over the  next 90 days, patient will:  - take medications as prescribed collaborate with provider on medication access solutions  Follow Up Plan: Telephone follow up appointment with care management team member scheduled for: 4 WEEKS         The patient verbalized understanding of instructions, educational materials, and care plan provided today and declined offer to receive copy of patient instructions, educational materials, and care plan.   Telephone follow up appointment with care management team member scheduled for: 4-6 WEEKS   Regina Eck, PharmD, BCPS Clinical Pharmacist, [place]  II Phone [phone]

## 2020-12-29 ENCOUNTER — Other Ambulatory Visit (HOSPITAL_COMMUNITY): Payer: Self-pay | Admitting: Cardiology

## 2020-12-30 DIAGNOSIS — E1165 Type 2 diabetes mellitus with hyperglycemia: Secondary | ICD-10-CM

## 2020-12-30 DIAGNOSIS — Z794 Long term (current) use of insulin: Secondary | ICD-10-CM

## 2021-01-05 NOTE — Progress Notes (Signed)
Received notification from [place] regarding approval for TRULICITY 0.75MG /0.5ML. Patient assistance approved from 01/01/21 to 04/01/21.  Phone: [phone]

## 2021-01-06 ENCOUNTER — Ambulatory Visit (INDEPENDENT_AMBULATORY_CARE_PROVIDER_SITE_OTHER): Payer: Medicare Other | Admitting: Pharmacist

## 2021-01-06 DIAGNOSIS — E1165 Type 2 diabetes mellitus with hyperglycemia: Secondary | ICD-10-CM

## 2021-01-06 DIAGNOSIS — Z794 Long term (current) use of insulin: Secondary | ICD-10-CM

## 2021-01-12 ENCOUNTER — Encounter: Payer: Self-pay | Admitting: Family Medicine

## 2021-01-12 ENCOUNTER — Other Ambulatory Visit: Payer: Self-pay

## 2021-01-12 ENCOUNTER — Ambulatory Visit (INDEPENDENT_AMBULATORY_CARE_PROVIDER_SITE_OTHER): Payer: Medicare Other | Admitting: Family Medicine

## 2021-01-12 VITALS — BP 132/82 | HR 80 | Temp 97.2°F | Ht 68.0 in | Wt 164.0 lb

## 2021-01-12 DIAGNOSIS — Z23 Encounter for immunization: Secondary | ICD-10-CM

## 2021-01-12 DIAGNOSIS — E1165 Type 2 diabetes mellitus with hyperglycemia: Secondary | ICD-10-CM | POA: Diagnosis not present

## 2021-01-12 DIAGNOSIS — Z794 Long term (current) use of insulin: Secondary | ICD-10-CM

## 2021-01-12 MED ORDER — TRULICITY 0.75 MG/0.5ML ~~LOC~~ SOAJ: 0.7500 mg | SUBCUTANEOUS | 1 refills | Status: DC

## 2021-01-12 NOTE — Progress Notes (Signed)
Assessment & Plan:  1. Type 2 diabetes mellitus with hyperglycemia, with long-term current use of insulin (HCC) Lab Results  Component Value Date   HGBA1C 6.0 12/01/2020   HGBA1C 6.3 09/10/2019   HGBA1C >14.0 (H) 05/28/2019    - Diabetes is at goal of A1c < 7. - Medications: continue current regimen of Trulicity and Farxiga  - tolerating Trulicity at current dose - Home glucose monitoring: continue to monitor. - Patient is currently taking a statin. Patient is taking an ACE-inhibitor/ARB.   Diabetes Health Maintenance Due  Topic Date Due   OPHTHALMOLOGY EXAM  Never done   HEMOGLOBIN A1C  03/02/2021   FOOT EXAM  12/01/2021   - encouraged to continue healthy diet and exercise - Dulaglutide (TRULICITY) 9.75 PY/0.5RT SOPN; Inject 0.75 mg into the skin once a week.  Dispense: 2 mL; Refill: 2  2. Need for immunization against influenza - Flu Vaccine QUAD High Dose(Fluad) - given in office   Return in about 2 months (around 03/14/2021) for follow-up of chronic medication conditions.  Lucile Crater, NP Student  I personally was present during the history, physical exam, and medical decision-making activities of this service and have verified that the service and findings are accurately documented in the nurse practitioner student's note.  Hendricks Limes, MSN, APRN, FNP-C [facility]   Subjective:    Patient ID: Ricardo Hunt, male    DOB: September 07, 1940, 80 y.o.   MRN: [id number]  Patient Care Team: Loman Brooklyn, FNP as PCP - General (Family Medicine) Thompson Grayer, MD as Consulting Physician (Cardiology) Herminio Commons, MD (Inactive) as Consulting Physician (Cardiology) Larey Dresser, MD as Consulting Physician (Cardiology) Lavera Guise, [facility] as Pharmacist (Family Medicine)   Chief Complaint:  Chief Complaint  Patient presents with   Diabetes    6 week follow up     HPI: Ricardo Hunt is a 80 y.o. male presenting on 01/12/2021 for  Diabetes (6 week follow up )  Diabetes: Patient presents for follow up of diabetes. Current symptoms include: hyperglycemia. Known diabetic complications: cardiovascular disease. Medication compliance: Trulicity 7.01 mg weekly, Farxiga 10 mg qd. Current diet: in general, an "unhealthy" diet. Current exercise: yard work. Home blood sugar records:  postprandial BG 109-176 . Is he  on ACE inhibitor or angiotensin II receptor blocker? Yes. Is he on a statin? Yes.   New complaints: None  Social history:  Relevant past medical, surgical, family and social history reviewed and updated as indicated. Interim medical history since our last visit reviewed.  Allergies and medications reviewed and updated.  DATA REVIEWED: CHART IN EPIC  ROS: Negative unless specifically indicated above in HPI.    Current Outpatient Medications:    ACCU-CHEK GUIDE test strip, CHECK BS FOUR TIMES A DAY BEFORE MEALS AT BEDTIME Dx E11.65, Disp: 100 each, Rfl: 12   Accu-Chek Softclix Lancets lancets, CHECK BS FOUR TIMES A DAY BEFORE MEALS AT BEDTIME Dx E11.65, Disp: 100 each, Rfl: 12   carvedilol (COREG) 6.25 MG tablet, TAKE 1 TABLET 2 TIMES A DAY WITH A MEAL, Disp: 180 tablet, Rfl: 2   dapagliflozin propanediol (FARXIGA) 10 MG TABS tablet, Take 1 tablet (10 mg total) by mouth daily before breakfast., Disp: 30 tablet, Rfl: 11   digoxin (LANOXIN) 0.125 MG tablet, TAKE 1 TABLET DAILY, Disp: 30 tablet, Rfl: 2   ELIQUIS 5 MG TABS tablet, TAKE  (1)  TABLET TWICE A DAY., Disp: 60 tablet, Rfl: 3   ENTRESTO  24-26 MG, TAKE (1) TABLET TWICE A DAY., Disp: 60 tablet, Rfl: 6   furosemide (LASIX) 40 MG tablet, TAKE 1 TABLET EVERY MORNING AND 1/2 EVERY AFTERNOON, Disp: 45 tablet, Rfl: 6   Insulin Pen Needle (TRUEPLUS PEN NEEDLES) 31G X 5 MM MISC, Use five times daily with insulin Dx E11.65, Disp: 500 each, Rfl: 3   rosuvastatin (CRESTOR) 20 MG tablet, TAKE 1 TABLET AT 6PM, Disp: 30 tablet, Rfl: 2   spironolactone (ALDACTONE) 25 MG  tablet, TAKE 1 TABLET DAILY, Disp: 30 tablet, Rfl: 3   Dulaglutide (TRULICITY) 6.30 ZS/0.1UX SOPN, Inject 0.75 mg into the skin once a week., Disp: 6 mL, Rfl: 1   No Known Allergies Past Medical History:  Diagnosis Date   Arthritis    " all over the body"   Atrial fibrillation and flutter (HCC)    seen on PPM interrogation; Rivaroxaban started 05/2353   Chronic systolic CHF (congestive heart failure) ([place])    Echo 05/2018: EF 20-25, severe LVE, diffuse HK with septal and apical akinesis, moderate LAE, moderate MAC, severe aortic valve calcification - degree of AS diff to judge with low EF (likely mild to mod)   Coronary artery disease    s/p CABG 1996 // LHC 05/2018: oLAD 90, pLAD 100 after Dx, LCx 90 at bifurcation of OM1/OM2, mRCA 50; L-LAD ok; S-D1/OM2 100 >> PCI: DES to mLCx and orbital atherectomy and DES to oLAD   Diabetes mellitus without complication (HCC)    GERD (gastroesophageal reflux disease)    Hyperlipidemia    Hypertension    Mobitz (type) II atrioventricular block    s/p STJ Accent pacemaker implanted by Dr [name] 10-16-2012   Myocardial infarction [facility])    per pt., told that that the stress test shows that there is a weakening evident on the stress test   Pacemaker 10/16/2012   St. Jude    Personal history of colonic polyps-tubular adenomas 08/31/2013   Thrombocytopenia ([place]) 10/03/2012   Vertigo     Past Surgical History:  Procedure Laterality Date   BIV UPGRADE N/A 11/25/2018   Procedure: BIV UPGRADE;  Surgeon: Thompson Grayer, MD;  Location: [facility];  Service: Cardiovascular;  Laterality: N/A;   cardiac bypass  1996   3 vessels   CATARACT EXTRACTION W/ INTRAOCULAR LENS  IMPLANT, BILATERAL Bilateral 2010   COLONOSCOPY     CORONARY ARTERY BYPASS GRAFT     CORONARY ATHERECTOMY N/A 05/23/2018   Procedure: CORONARY ATHERECTOMY;  Surgeon: [name], Peter M, MD;  Location: [facility];  Service: Cardiovascular;  Laterality: N/A;   CORONARY STENT INTERVENTION  N/A 05/23/2018   Procedure: CORONARY STENT INTERVENTION;  Surgeon: [name], Peter M, MD;  Location: [facility];  Service: Cardiovascular;  Laterality: N/A;   INGUINAL HERNIA REPAIR Left 2010   LUMBAR LAMINECTOMY/DECOMPRESSION MICRODISCECTOMY Right 07/19/2016   Procedure: Right Lumbar Four-Five Hemilaminectomy and Bilateral Lumbar Two-Three Laminectomy;  Surgeon: Eustace Moore, MD;  Location: [facility];  Service: Neurosurgery;  Laterality: Right;  Right Lumbar Four-Five Hemilaminectomy and Bilateral Lumbar Two-Three Laminectomy   LUMBAR SPINE SURGERY  2006   NASAL SEPTUM SURGERY  09/01/13   Dr. Adriana Reams   PACEMAKER INSERTION  10-16-2012   STJ Accent dual chamber pacemaker implanted by Dr [name] for symptomatic Mobitz II heart block   PERMANENT PACEMAKER INSERTION N/A 10/16/2012   Procedure: PERMANENT PACEMAKER INSERTION;  Surgeon: Thompson Grayer, MD;  Location: [facility] C[facility] LAB;  Service: Cardiovascular;  Laterality: N/A;  RIGHT/LEFT HEART CATH AND CORONARY/GRAFT ANGIOGRAPHY N/A 05/20/2018   Procedure: RIGHT/LEFT HEART CATH AND CORONARY/GRAFT ANGIOGRAPHY;  Surgeon: [name], Peter M, MD;  Location: [facility];  Service: Cardiovascular;  Laterality: N/A;    Social History   Socioeconomic History   Marital status: Widowed    Spouse name: Not on file   Number of children: Not on file   Years of education: Not on file   Highest education level: Not on file  Occupational History   Not on file  Tobacco Use   Smoking status: Former    Packs/day: 2.00    Years: 40.00    Pack years: 80.00    Types: Cigarettes    Quit date: 04/02/1994    Years since quitting: 26.8   Smokeless tobacco: Never  Vaping Use   Vaping Use: Never used  Substance and Sexual Activity   Alcohol use: Not Currently    Alcohol/week: 0.0 standard drinks   Drug use: No   Sexual activity: Not Currently  Other Topics Concern   Not on file  Social History Narrative   Not on file   Social Determinants of Health    Financial Resource Strain: Not on file  Food Insecurity: Not on file  Transportation Needs: Not on file  Physical Activity: Not on file  Stress: Not on file  Social Connections: Not on file  Intimate Partner Violence: Not on file        Objective:    BP 132/82   Pulse 80   Temp (!) 97.2 F (36.2 C) (Temporal)   Ht _0  (1.727 m)   Wt 74.4 kg   SpO2 95%   BMI 24.94 kg/m   Wt Readings from Last 3 Encounters:  01/12/21 164 lb (74.4 kg)  12/01/20 167 lb 12.8 oz (76.1 kg)  08/04/20 165 lb 12.6 oz (75.2 kg)    Physical Exam Vitals reviewed.  Constitutional:      General: He is not in acute distress.    Appearance: Normal appearance. He is normal weight. He is not ill-appearing, toxic-appearing or diaphoretic.  HENT:     Head: Normocephalic and atraumatic.  Eyes:     General: No scleral icterus.       Right eye: No discharge.        Left eye: No discharge.     Conjunctiva/sclera: Conjunctivae normal.  Cardiovascular:     Rate and Rhythm: Normal rate and regular rhythm.     Heart sounds: Normal heart sounds. No murmur heard.   No friction rub. No gallop.  Pulmonary:     Effort: Pulmonary effort is normal. No respiratory distress.     Breath sounds: Normal breath sounds. No stridor. No wheezing, rhonchi or rales.  Musculoskeletal:        General: Normal range of motion.     Cervical back: Normal range of motion.  Skin:    General: Skin is warm and dry.  Neurological:     Mental Status: He is alert and oriented to person, place, and time. Mental status is at baseline.  Psychiatric:        Mood and Affect: Mood normal.        Behavior: Behavior normal.        Thought Content: Thought content normal.        Judgment: Judgment normal.    Lab Results  Component Value Date   TSH 3.550 08/04/2020   Lab Results  Component Value Date   WBC 6.3 12/01/2020  HGB 16.6 12/01/2020   HCT 48.3 12/01/2020   MCV 96 12/01/2020   PLT 166 12/01/2020   Lab Results   Component Value Date   NA 140 12/01/2020   K 4.3 12/01/2020   CO2 24 12/01/2020   GLUCOSE 148 (H) 12/01/2020   BUN 25 12/01/2020   CREATININE 1.53 (H) 12/01/2020   BILITOT 0.6 12/01/2020   ALKPHOS 56 12/01/2020   AST 20 12/01/2020   ALT 16 12/01/2020   PROT 6.2 12/01/2020   ALBUMIN 5.0 (H) 12/01/2020   CALCIUM 9.7 12/01/2020   ANIONGAP 7 08/04/2020   EGFR 45 (L) 12/01/2020   Lab Results  Component Value Date   CHOL 149 12/01/2020   Lab Results  Component Value Date   HDL 32 (L) 12/01/2020   Lab Results  Component Value Date   LDLCALC 78 12/01/2020   Lab Results  Component Value Date   TRIG 236 (H) 12/01/2020   Lab Results  Component Value Date   CHOLHDL 4.7 12/01/2020   Lab Results  Component Value Date   HGBA1C 6.0 12/01/2020

## 2021-01-18 ENCOUNTER — Telehealth: Payer: Self-pay | Admitting: *Deleted

## 2021-01-18 NOTE — Telephone Encounter (Signed)
PT Assistance Trulicity is here and pt LM -aware to pick up  #4 trulicity in fridge with name on it.

## 2021-01-25 NOTE — Patient Instructions (Signed)
Visit Information  PATIENT GOALS:  Goals Addressed               This Visit's Progress     Patient Stated     T2DM PHARMD GOAL (pt-stated)        Current Barriers:  Unable to independently afford treatment regimen Suboptimal therapeutic regimen for T2DM  Pharmacist Clinical Goal(s):  Over the next 90 days, patient will verbalize ability to afford treatment regimen maintain control of T2DM as evidenced by [place], ADDITIONAL HEALTH BENEFITS/PROTECTION  through collaboration with PharmD and provider.    Interventions: 1:1 collaboration with Loman Brooklyn, FNP regarding development and update of comprehensive plan of care as evidenced by provider attestation and co-signature Inter-disciplinary care team collaboration (see longitudinal plan of care) Comprehensive medication review performed; medication list updated in electronic medical record  Diabetes: Controlled-A1C 6.0%, GFR 45; Current treatment: FARXIGA; TRULICITY 0.75MG  WEEKLY CONTINUE TRULICITY 0.75MG  SQ WEEKLY Denies personal and family history of Medullary thyroid cancer ([place]) WILL COMPLETE PATIENT ASSISTANCE VIA [employer] DAUGHTER WORK NUMBER [phone] WILL HAVE TRULICITY SHIPPED HERE--PT NOT HOME A LOT NEED TO CONTACT DAUGHTER ([place] PHONE) CONTINUE FARXIGA Current glucose readings: fasting glucose: <135, post prandial glucose: N/A Denies hypoglycemic/hyperglycemic symptoms Discussed meal planning options and Plate method for healthy eating Avoid sugary drinks and desserts Incorporate balanced protein, non starchy veggies, 1 serving of carbohydrate with each meal Increase water intake Increase physical activity as able Current exercise: N/A Recommended  CONTINUE SGLT2 ([place]) & TRULICITY (GLP1) Assessed patient finances. PATIENT APPROVED FOR TRULICITY VIA [employer]    Patient Goals/Self-Care Activities Over the next 90 days, patient will:  - take  medications as prescribed collaborate with provider on medication access solutions  Follow Up Plan: Telephone follow up appointment with care management team member scheduled for: 3 MONTHS         The patient verbalized understanding of instructions, educational materials, and care plan provided today and declined offer to receive copy of patient instructions, educational materials, and care plan.   Telephone follow up appointment with care management team member scheduled for: 3 MONTHS   Regina Eck, PharmD, BCPS Clinical Pharmacist, [place]  II Phone [phone]

## 2021-01-25 NOTE — Progress Notes (Signed)
Chronic Care Management Pharmacy Note  01/06/2021 Name:  Ricardo Hunt Ricardo Hunt MRN:  [id number] DOB:  1940/09/11  Summary: T2DM  Recommendations/Changes made from today's visit: Diabetes: Controlled-A1C 6.0%, GFR 45; Current treatment: FARXIGA; TRULICITY 5.85ID WEEKLY CONTINUE TRULICITY 7.82UM SQ WEEKLY Denies personal and family history of Medullary thyroid cancer ([place]) Patient approved for [facility] trulicity--shipment arrived at PCP office DAUGHTER WORK NUMBER [phone] WILL HAVE TRULICITY SHIPPED HERE--PT NOT HOME Ricardo Bryant ([place] PHONE) CONTINUE FARXIGA Current glucose readings: fasting glucose: <135, post prandial glucose: N/Ricardo Denies hypoglycemic/hyperglycemic symptoms Discussed meal planning options and Plate method for healthy eating Avoid sugary drinks and desserts Incorporate balanced protein, non starchy veggies, 1 serving of carbohydrate with each meal Increase water intake Increase physical activity as able Current exercise: N/Ricardo Recommended  CONTINUE SGLT2 (FARXIGA) & TRULICITY (GLP1) Assessed patient finances. PATIENT APPROVED FOR TRULICITY VIA [facility]   Follow Up Plan: Telephone follow up appointment with care management team member scheduled for: 3 MONTHS  Subjective: Ricardo Hunt is an 80 y.o. year old male who is Ricardo primary patient of Loman Brooklyn, FNP.  The CCM team was consulted for assistance with disease management and care coordination needs.    Engaged with patient by telephone for follow up visit in response to provider referral for pharmacy case management and/or care coordination services.   Consent to Services:  The patient was given information about Chronic Care Management services, agreed to services, and gave verbal consent prior to initiation of services.  Please see initial visit note for detailed documentation.   Patient Care Team: Loman Brooklyn, FNP as PCP - General (Family  Medicine) Thompson Grayer, MD as Consulting Physician (Cardiology) Herminio Commons, MD (Inactive) as Consulting Physician (Cardiology) Larey Dresser, MD as Consulting Physician (Cardiology) Lavera Guise, [facility] as Pharmacist (Family Medicine)   Objective:  Lab Results  Component Value Date   CREATININE 1.53 (H) 12/01/2020   CREATININE 1.32 (H) 08/04/2020   CREATININE 1.26 (H) 02/15/2020    Lab Results  Component Value Date   HGBA1C 6.0 12/01/2020   Last diabetic Eye exam: No results found for: HMDIABEYEEXA  Last diabetic Foot exam: No results found for: HMDIABFOOTEX      Component Value Date/Time   CHOL 149 12/01/2020 0904   TRIG 236 (H) 12/01/2020 0904   HDL 32 (L) 12/01/2020 0904   CHOLHDL 4.7 12/01/2020 0904   CHOLHDL 4.4 08/04/2020 0904   VLDL 38 08/04/2020 0904   LDLCALC 78 12/01/2020 0904    Hepatic Function Latest Ref Rng & Units 12/01/2020 08/04/2020 01/12/2007  Total Protein 6.0 - 8.5 g/dL 6.2 5.9(L) 6.1  Albumin 3.6 - 4.6 g/dL 5.0(H) 4.0 3.7  AST 0 - 40 IU/L _0 ALT 0 - 44 IU/L _1 Alk Phosphatase 44 - 121 IU/L 56 52 49  Total Bilirubin 0.0 - 1.2 mg/dL 0.6 0.8 0.7    Lab Results  Component Value Date/Time   TSH 3.550 08/04/2020 09:04 AM   TSH 1.104 10/03/2012 10:18 PM   FREET4 1.16 10/04/2012 10:45 AM    CBC Latest Ref Rng & Units 12/01/2020 08/04/2020 09/07/2019  WBC 3.4 - 10.8 x10E3/uL 6.3 7.3 7.1  Hemoglobin 13.0 - 17.7 g/dL 16.6 15.8 15.5  Hematocrit 37.5 - 51.0 % 48.3 47.8 47.8  Platelets 150 - 450 x10E3/uL 166 160 179    No results found for: VD25OH  Clinical ASCVD: No  The ASCVD Risk score (Arnett DK, et al., 2019) failed to calculate for the following reasons:   The 2019 ASCVD risk score is only valid for ages 39 to 44    Other: (CHADS2VASc if Afib, PHQ9 if depression, MMRC or CAT for COPD, ACT, DEXA)  Social History   Tobacco Use  Smoking Status Former   Packs/day: 2.00   Years: 40.00   Pack years: 80.00   Types:  Cigarettes   Quit date: 04/02/1994   Years since quitting: 26.8  Smokeless Tobacco Never   BP Readings from Last 3 Encounters:  01/12/21 132/82  12/01/20 138/77  08/04/20 132/72   Pulse Readings from Last 3 Encounters:  01/12/21 80  12/01/20 67  08/04/20 60   Wt Readings from Last 3 Encounters:  01/12/21 164 lb (74.4 kg)  12/01/20 167 lb 12.8 oz (76.1 kg)  08/04/20 165 lb 12.6 oz (75.2 kg)    Assessment: Review of patient past medical history, allergies, medications, health status, including review of consultants reports, laboratory and other test data, was performed as part of comprehensive evaluation and provision of chronic care management services.   SDOH:  (Social Determinants of Health) assessments and interventions performed:    CCM Care Plan  No Known Allergies  Medications Reviewed Today     Reviewed by Lavera Guise, [facility] (Pharmacist) on 01/25/21 at 0930  Med List Status: <None>   Medication Order Taking? Sig Documenting Provider Last Dose Status Informant  ACCU-CHEK GUIDE test strip [id number] No CHECK BS FOUR TIMES Ricardo DAY BEFORE MEALS AT BEDTIME Dx E11.65 Loman Brooklyn, FNP Taking Active   Accu-Chek Softclix Lancets lancets [id number] No CHECK BS FOUR TIMES Ricardo DAY BEFORE MEALS AT BEDTIME Dx E11.65 Loman Brooklyn, FNP Taking Active   carvedilol (COREG) 6.25 MG tablet [id number] No TAKE 1 TABLET 2 TIMES Ricardo DAY WITH Ricardo MEAL Verta Ellen., NP Taking Active   dapagliflozin propanediol (FARXIGA) 10 MG TABS tablet [id number] No Take 1 tablet (10 mg total) by mouth daily before breakfast. Larey Dresser, MD Taking Active   digoxin (LANOXIN) 0.125 MG tablet [id number] No TAKE 1 TABLET DAILY Verta Ellen., NP Taking Active   Dulaglutide (TRULICITY) 3.38 SN/0.5LZ SOPN [id number]  Inject 0.75 mg into the skin once Ricardo week. Loman Brooklyn, FNP  Active   ELIQUIS 5 MG TABS tablet [id number] No TAKE  (1)  TABLET TWICE Ricardo DAY. Larey Dresser, MD Taking Active   ENTRESTO  24-26 MG [id number] No TAKE (1) TABLET TWICE Ricardo DAY. Larey Dresser, MD Taking Active   furosemide (LASIX) 40 MG tablet [id number] No TAKE 1 TABLET EVERY MORNING AND 1/2 EVERY AFTERNOON Larey Dresser, MD Taking Active   Insulin Pen Needle (TRUEPLUS PEN NEEDLES) 31G X 5 MM MISC [id number] No Use five times daily with insulin Dx E11.65 Loman Brooklyn, FNP Taking Active   rosuvastatin (CRESTOR) 20 MG tablet [id number] No TAKE 1 TABLET AT Verlee Rossetti, Lavetta Nielsen., NP Taking Active   spironolactone (ALDACTONE) 25 MG tablet [id number] No TAKE 1 TABLET DAILY Clegg, Amy D, NP Taking Active             Patient Active Problem List   Diagnosis Date Noted   Type 2 diabetes mellitus with hyperglycemia, with long-term current use of insulin (HCC) 06/07/2019   GERD (gastroesophageal reflux disease)    Atrial fibrillation and flutter (HCC)    CKD (chronic kidney disease), stage III ([place])  Chronic systolic CHF (congestive heart failure) ([place]) 05/20/2018   S/P lumbar laminectomy 07/19/2016   Personal history of colonic polyps-tubular adenomas 08/31/2013   Complete heart block (HCC) s/p Pacemaker in 2014 10/15/2012   S/P CABG (coronary artery bypass graft) 10/06/2012   Coronary artery disease involving native coronary artery of native heart with angina pectoris ([place]) 10/03/2012   Essential hypertension 10/03/2012   Hyperlipidemia 10/03/2012    Immunization History  Administered Date(s) Administered   Fluad Quad(high Dose 65+) 01/12/2021   Influenza, High Dose Seasonal PF 01/30/2017, 02/18/2018   Influenza,inj,Quad PF,6+ Mos 01/30/2017   Influenza,inj,quad, With Preservative 12/30/2018   Influenza-Unspecified 01/31/2018, 01/20/2020   Pneumococcal Conjugate-13 03/22/2017   Pneumococcal Polysaccharide-23 06/22/2015   Tdap 06/23/2014   Zoster, Live 01/20/2013    Conditions to be addressed/monitored: DMII  Care Plan : PHARMD MEDICATION MANAGEMENT  Updates made by Lavera Guise, [place] since  01/25/2021 12:00 AM     Problem: DISEASE PROGRESSION PREVENTION      Long-Range Goal: T2DM   Recent Progress: On track  Priority: High  Note:   Current Barriers:  Unable to independently afford treatment regimen Suboptimal therapeutic regimen for T2DM  Pharmacist Clinical Goal(s):  Over the next 90 days, patient will verbalize ability to afford treatment regimen maintain control of T2DM as evidenced by [place], ADDITIONAL HEALTH BENEFITS/PROTECTION  through collaboration with PharmD and provider.    Interventions: 1:1 collaboration with Loman Brooklyn, FNP regarding development and update of comprehensive plan of care as evidenced by provider attestation and co-signature Inter-disciplinary care team collaboration (see longitudinal plan of care) Comprehensive medication review performed; medication list updated in electronic medical record  Diabetes: Controlled-A1C 6.0%, GFR 45; Current treatment: FARXIGA; TRULICITY 3.41DQ WEEKLY CONTINUE TRULICITY 2.22LN SQ WEEKLY Denies personal and family history of Medullary thyroid cancer ([place]) WILL COMPLETE PATIENT ASSISTANCE VIA [facility] DAUGHTER WORK NUMBER [phone] WILL HAVE TRULICITY SHIPPED HERE--PT NOT HOME Ricardo LOT NEED TO CONTACT DAUGHTER ([place] PHONE) CONTINUE FARXIGA Current glucose readings: fasting glucose: <135, post prandial glucose: N/Ricardo Denies hypoglycemic/hyperglycemic symptoms Discussed meal planning options and Plate method for healthy eating Avoid sugary drinks and desserts Incorporate balanced protein, non starchy veggies, 1 serving of carbohydrate with each meal Increase water intake Increase physical activity as able Current exercise: N/Ricardo Recommended  CONTINUE SGLT2 ([place]) & TRULICITY (GLP1) Assessed patient finances. PATIENT APPROVED FOR TRULICITY VIA [facility]    Patient Goals/Self-Care Activities Over the next 90 days, patient will:  - take medications as  prescribed collaborate with provider on medication access solutions  Follow Up Plan: Telephone follow up appointment with care management team member scheduled for: 3 MONTHS      Medication Assistance:  trulicityobtained through [place] cares medication assistance program.  Enrollment ends 04/01/21  Patient's preferred pharmacy is:  [facility], [address] [place] [address] Phone: [phone] Fax: [phone]  [facility]  ([place]) - [place], [address] [facility] [address] [place] [address] Phone: [phone] Fax: [phone]  [facility] [place], [place] - [address] [place] [address] Phone: [phone] Fax: [phone]  Follow Up:  Patient agrees to Care Plan and Follow-up.  Plan: Telephone follow up appointment with care management team member scheduled for:  3 months    Regina Eck, PharmD,  BCPS Clinical Pharmacist, [place]  II Phone [phone]

## 2021-01-30 DIAGNOSIS — E1165 Type 2 diabetes mellitus with hyperglycemia: Secondary | ICD-10-CM

## 2021-01-30 DIAGNOSIS — Z794 Long term (current) use of insulin: Secondary | ICD-10-CM | POA: Diagnosis not present

## 2021-02-02 ENCOUNTER — Other Ambulatory Visit: Payer: Self-pay | Admitting: Family Medicine

## 2021-02-02 ENCOUNTER — Other Ambulatory Visit (HOSPITAL_COMMUNITY): Payer: Self-pay | Admitting: Adult Health

## 2021-02-21 ENCOUNTER — Ambulatory Visit (INDEPENDENT_AMBULATORY_CARE_PROVIDER_SITE_OTHER): Payer: Medicare Other

## 2021-02-21 DIAGNOSIS — I5022 Chronic systolic (congestive) heart failure: Secondary | ICD-10-CM

## 2021-02-21 LAB — CUP PACEART REMOTE DEVICE CHECK
Battery Remaining Longevity: 82 mo
Battery Remaining Percentage: 78 %
Battery Voltage: 2.99 V
Brady Statistic AP VP Percent: 30 %
Brady Statistic AP VS Percent: 1 %
Brady Statistic AS VP Percent: 65 %
Brady Statistic AS VS Percent: 1 %
Brady Statistic RA Percent Paced: 26 %
Date Time Interrogation Session: 20221122020009
Implantable Lead Implant Date: 20140717
Implantable Lead Implant Date: 20140717
Implantable Lead Implant Date: 20200825
Implantable Lead Location: 753858
Implantable Lead Location: 753859
Implantable Lead Location: 753860
Implantable Lead Model: 1948
Implantable Pulse Generator Implant Date: 20200825
Lead Channel Impedance Value: 1000 Ohm
Lead Channel Impedance Value: 450 Ohm
Lead Channel Impedance Value: 660 Ohm
Lead Channel Pacing Threshold Amplitude: 0.75 V
Lead Channel Pacing Threshold Amplitude: 1 V
Lead Channel Pacing Threshold Amplitude: 1 V
Lead Channel Pacing Threshold Pulse Width: 0.4 ms
Lead Channel Pacing Threshold Pulse Width: 0.4 ms
Lead Channel Pacing Threshold Pulse Width: 0.5 ms
Lead Channel Sensing Intrinsic Amplitude: 12 mV
Lead Channel Sensing Intrinsic Amplitude: 4.4 mV
Lead Channel Setting Pacing Amplitude: 2 V
Lead Channel Setting Pacing Amplitude: 2.25 V
Lead Channel Setting Pacing Amplitude: 2.5 V
Lead Channel Setting Pacing Pulse Width: 0.4 ms
Lead Channel Setting Pacing Pulse Width: 0.5 ms
Lead Channel Setting Sensing Sensitivity: 4 mV
Pulse Gen Model: 3562
Pulse Gen Serial Number: 9158588

## 2021-02-27 ENCOUNTER — Ambulatory Visit: Payer: Medicare Other

## 2021-02-28 ENCOUNTER — Encounter: Payer: Self-pay | Admitting: Internal Medicine

## 2021-03-02 NOTE — Progress Notes (Signed)
Remote pacemaker transmission.   

## 2021-03-07 ENCOUNTER — Encounter: Payer: Self-pay | Admitting: Internal Medicine

## 2021-03-14 ENCOUNTER — Ambulatory Visit (INDEPENDENT_AMBULATORY_CARE_PROVIDER_SITE_OTHER): Payer: Medicare Other | Admitting: Family Medicine

## 2021-03-14 ENCOUNTER — Ambulatory Visit: Payer: Medicare Other | Admitting: Pharmacist

## 2021-03-14 ENCOUNTER — Other Ambulatory Visit: Payer: Self-pay | Admitting: Cardiology

## 2021-03-14 ENCOUNTER — Encounter: Payer: Self-pay | Admitting: Family Medicine

## 2021-03-14 VITALS — BP 113/70 | HR 72 | Temp 97.3°F | Ht 68.0 in | Wt 160.8 lb

## 2021-03-14 DIAGNOSIS — I442 Atrioventricular block, complete: Secondary | ICD-10-CM | POA: Diagnosis not present

## 2021-03-14 DIAGNOSIS — E782 Mixed hyperlipidemia: Secondary | ICD-10-CM | POA: Diagnosis not present

## 2021-03-14 DIAGNOSIS — I4891 Unspecified atrial fibrillation: Secondary | ICD-10-CM

## 2021-03-14 DIAGNOSIS — I10 Essential (primary) hypertension: Secondary | ICD-10-CM | POA: Diagnosis not present

## 2021-03-14 DIAGNOSIS — E1165 Type 2 diabetes mellitus with hyperglycemia: Secondary | ICD-10-CM | POA: Diagnosis not present

## 2021-03-14 DIAGNOSIS — I4892 Unspecified atrial flutter: Secondary | ICD-10-CM

## 2021-03-14 DIAGNOSIS — Z794 Long term (current) use of insulin: Secondary | ICD-10-CM | POA: Diagnosis not present

## 2021-03-14 DIAGNOSIS — N1831 Chronic kidney disease, stage 3a: Secondary | ICD-10-CM | POA: Diagnosis not present

## 2021-03-14 DIAGNOSIS — I25119 Atherosclerotic heart disease of native coronary artery with unspecified angina pectoris: Secondary | ICD-10-CM

## 2021-03-14 DIAGNOSIS — I5022 Chronic systolic (congestive) heart failure: Secondary | ICD-10-CM | POA: Diagnosis not present

## 2021-03-14 LAB — CBC WITH DIFFERENTIAL/PLATELET
Basophils Absolute: 0.1 10*3/uL (ref 0.0–0.2)
Basos: 1 %
EOS (ABSOLUTE): 0.6 10*3/uL — ABNORMAL HIGH (ref 0.0–0.4)
Eos: 9 %
Hematocrit: 50.5 % (ref 37.5–51.0)
Hemoglobin: 16.9 g/dL (ref 13.0–17.7)
Immature Grans (Abs): 0 10*3/uL (ref 0.0–0.1)
Immature Granulocytes: 0 %
Lymphocytes Absolute: 1.2 10*3/uL (ref 0.7–3.1)
Lymphs: 17 %
MCH: 32.3 pg (ref 26.6–33.0)
MCHC: 33.5 g/dL (ref 31.5–35.7)
MCV: 96 fL (ref 79–97)
Monocytes Absolute: 0.5 10*3/uL (ref 0.1–0.9)
Monocytes: 8 %
Neutrophils Absolute: 4.5 10*3/uL (ref 1.4–7.0)
Neutrophils: 65 %
Platelets: 158 10*3/uL (ref 150–450)
RBC: 5.24 x10E6/uL (ref 4.14–5.80)
RDW: 13.3 % (ref 11.6–15.4)
WBC: 7 10*3/uL (ref 3.4–10.8)

## 2021-03-14 LAB — CMP14+EGFR
ALT: 9 IU/L (ref 0–44)
AST: 13 IU/L (ref 0–40)
Albumin/Globulin Ratio: 2.6 — ABNORMAL HIGH (ref 1.2–2.2)
Albumin: 4.5 g/dL (ref 3.6–4.6)
Alkaline Phosphatase: 65 IU/L (ref 44–121)
BUN/Creatinine Ratio: 17 (ref 10–24)
BUN: 24 mg/dL (ref 8–27)
Bilirubin Total: 0.7 mg/dL (ref 0.0–1.2)
CO2: 22 mmol/L (ref 20–29)
Calcium: 9.4 mg/dL (ref 8.6–10.2)
Chloride: 101 mmol/L (ref 96–106)
Creatinine, Ser: 1.39 mg/dL — ABNORMAL HIGH (ref 0.76–1.27)
Globulin, Total: 1.7 g/dL (ref 1.5–4.5)
Glucose: 105 mg/dL — ABNORMAL HIGH (ref 70–99)
Potassium: 4.4 mmol/L (ref 3.5–5.2)
Sodium: 139 mmol/L (ref 134–144)
Total Protein: 6.2 g/dL (ref 6.0–8.5)
eGFR: 51 mL/min/{1.73_m2} — ABNORMAL LOW (ref >59)

## 2021-03-14 LAB — BAYER DCA HB A1C WAIVED: HB A1C (BAYER DCA - WAIVED): 5.7 % — ABNORMAL HIGH (ref 4.8–5.6)

## 2021-03-14 LAB — LIPID PANEL
Chol/HDL Ratio: 4.6 ratio (ref 0.0–5.0)
Cholesterol, Total: 130 mg/dL (ref 100–199)
HDL: 28 mg/dL — ABNORMAL LOW (ref >39)
LDL Chol Calc (NIH): 66 mg/dL (ref 0–99)
Triglycerides: 216 mg/dL — ABNORMAL HIGH (ref 0–149)
VLDL Cholesterol Cal: 36 mg/dL (ref 5–40)

## 2021-03-14 MED ORDER — DAPAGLIFLOZIN PROPANEDIOL 10 MG PO TABS: 10.0000 mg | ORAL_TABLET | Freq: Every day | ORAL | 5 refills | Status: AC

## 2021-03-14 MED ORDER — TRULICITY 0.75 MG/0.5ML ~~LOC~~ SOAJ: 0.7500 mg | SUBCUTANEOUS | 2 refills | Status: AC

## 2021-03-14 NOTE — Progress Notes (Signed)
Assessment & Plan:  1. Type 2 diabetes mellitus with hyperglycemia, without long-term current use of insulin (HCC) Lab Results  Component Value Date   HGBA1C 6.0 12/01/2020   HGBA1C 6.3 09/10/2019   HGBA1C >14.0 (H) 05/28/2019  A1c 5.7 today, which is decreased from 6.03 months ago. - Diabetes is at goal of A1c < 7. - Medications: continue current medications - Home glucose monitoring: Continue monitoring - Patient is currently taking a statin. Patient is taking an ACE-inhibitor/ARB.  - Instruction/counseling given: reminded to get eye exam  Diabetes Health Maintenance Due  Topic Date Due   OPHTHALMOLOGY EXAM  Never done   HEMOGLOBIN A1C  03/02/2021   FOOT EXAM  12/01/2021    Lab Results  Component Value Date   LABMICR 18.6 09/10/2019   - Lipid panel - CBC with Differential/Platelet - CMP14+EGFR - Bayer DCA Hb A1c Waived  2. Essential hypertension Well controlled on current regimen.  - Lipid panel - CBC with Differential/Platelet - CMP14+EGFR  3. Mixed hyperlipidemia Well controlled on current regimen.  - Lipid panel - CMP14+EGFR  4. Coronary artery disease involving native coronary artery of native heart with angina pectoris (HCC) Continue statin. - Lipid panel - CMP14+EGFR  5. Chronic systolic CHF (congestive heart failure) (HCC) Well controlled on current regimen.  Managed by cardiology. - Lipid panel - CBC with Differential/Platelet - CMP14+EGFR  6. Atrial fibrillation and flutter ([place]) Well controlled on current regimen.  Managed by cardiology. - CBC with Differential/Platelet - CMP14+EGFR  7. Complete heart block [facility]) s/p Pacemaker in 2014 Well controlled on current regimen.  Managed by cardiology. - CMP14+EGFR  8. Stage 3a chronic kidney disease ([place]) Continue Farxiga. - CMP14+EGFR   Return in about 4 months (around 07/13/2021) for annual physical.  Hendricks Limes, MSN, APRN, FNP-C [facility]  Subjective:     Patient ID: Ricardo Hunt, male    DOB: [birth year], 80 y.o.   MRN: [id number]  Patient Care Team: Loman Brooklyn, FNP as PCP - General (Family Medicine) Thompson Grayer, MD as Consulting Physician (Cardiology) Larey Dresser, MD as Consulting Physician (Cardiology) Lavera Guise, [facility] as Pharmacist (Family Medicine)   Chief Complaint:  Chief Complaint  Patient presents with   Diabetes   Hypertension    2 month follow up of chronic medical conditions     HPI: Ricardo Hunt is a 80 y.o. male presenting on 03/14/2021 for Diabetes and Hypertension (2 month follow up of chronic medical conditions )  Diabetes: Patient presents for follow up of diabetes. Current symptoms include: hyperglycemia. Known diabetic complications: cardiovascular disease. Medication compliance: Trulicity 5.80 mg weekly, Farxiga 10 mg daily. Current diet: in general, an "unhealthy" diet. Current exercise: yard work. Home blood sugar records:  "166 last night" . Is he  on ACE inhibitor or angiotensin II receptor blocker? Yes Delene Loll). Is he on a statin? Yes (Rosuvastatin).   Hypertension/Hyperlipidemia/CAD/CHF/A-Fib/Pacemaker: managed by cardiology. Patient does not check his blood pressure at home.   CKD: taking [place].   Depression screen [facility] 2/9 03/14/2021 01/12/2021 12/01/2020  Decreased Interest 3 0 0  Down, Depressed, Hopeless 0 0 0  PHQ - 2 Score 3 0 0  Altered sleeping 1 0 0  Tired, decreased energy 3 0 0  Change in appetite 2 0 0  Feeling bad or failure about yourself  0 0 3  Trouble concentrating 0 0 0  Moving slowly or fidgety/restless 0 0 0  Suicidal thoughts 0 0 0  PHQ-9 Score 9 0 3  Difficult doing work/chores Somewhat difficult Not difficult at all Not difficult at all  Some recent data might be hidden    New complaints: None  Social history:  Relevant past medical, surgical, family and social history reviewed and updated as indicated. Interim medical history since our last visit  reviewed.  Allergies and medications reviewed and updated.  DATA REVIEWED: CHART IN EPIC  ROS: Negative unless specifically indicated above in HPI.    Current Outpatient Medications:    ACCU-CHEK GUIDE test strip, CHECK BS FOUR TIMES A DAY BEFORE MEALS AT BEDTIME Dx E11.65, Disp: 100 each, Rfl: 12   Accu-Chek Softclix Lancets lancets, CHECK BS FOUR TIMES A DAY BEFORE MEALS AT BEDTIME Dx E11.65, Disp: 100 each, Rfl: 12   carvedilol (COREG) 6.25 MG tablet, TAKE 1 TABLET 2 TIMES A DAY WITH A MEAL, Disp: 180 tablet, Rfl: 2   dapagliflozin propanediol (FARXIGA) 10 MG TABS tablet, Take 1 tablet (10 mg total) by mouth daily before breakfast., Disp: 30 tablet, Rfl: 11   digoxin (LANOXIN) 0.125 MG tablet, TAKE 1 TABLET DAILY, Disp: 30 tablet, Rfl: 0   Dulaglutide (TRULICITY) 6.73 AL/9.3XT SOPN, Inject 0.75 mg into the skin once a week., Disp: 6 mL, Rfl: 1   ELIQUIS 5 MG TABS tablet, TAKE  (1)  TABLET TWICE A DAY., Disp: 60 tablet, Rfl: 3   ENTRESTO 24-26 MG, TAKE (1) TABLET TWICE A DAY., Disp: 60 tablet, Rfl: 6   furosemide (LASIX) 40 MG tablet, TAKE 1 TABLET EVERY MORNING AND 1/2 EVERY AFTERNOON, Disp: 45 tablet, Rfl: 6   Insulin Pen Needle (TRUEPLUS PEN NEEDLES) 31G X 5 MM MISC, Use five times daily with insulin Dx E11.65, Disp: 500 each, Rfl: 3   rosuvastatin (CRESTOR) 20 MG tablet, TAKE 1 TABLET AT 6PM, Disp: 30 tablet, Rfl: 0   spironolactone (ALDACTONE) 25 MG tablet, Take 1 tablet (25 mg total) by mouth daily. Needs appt, Disp: 30 tablet, Rfl: 3   No Known Allergies Past Medical History:  Diagnosis Date   Arthritis    " all over the body"   Atrial fibrillation and flutter ([place])    seen on PPM interrogation; Rivaroxaban started 0/2409   Chronic systolic CHF (congestive heart failure) ([place])    Echo 05/2018: EF 20-25, severe LVE, diffuse HK with septal and apical akinesis, moderate LAE, moderate MAC, severe aortic valve calcification - degree of AS diff to judge with low EF (likely mild to  mod)   Coronary artery disease    s/p CABG 1996 // [place] 05/2018: oLAD 90, pLAD 100 after Dx, LCx 90 at bifurcation of OM1/OM2, mRCA 50; L-LAD ok; S-D1/OM2 100 >> PCI: DES to mLCx and orbital atherectomy and DES to oLAD   Diabetes mellitus without complication (HCC)    GERD (gastroesophageal reflux disease)    Hyperlipidemia    Hypertension    Mobitz (type) II atrioventricular block    s/p STJ Accent pacemaker implanted by Dr [name] 10-16-2012   Myocardial infarction [facility]    per pt., told that that the stress test shows that there is a weakening evident on the stress test   Pacemaker 10/16/2012   St. Jude    Personal history of colonic polyps-tubular adenomas 08/31/2013   Thrombocytopenia ([place]) 10/03/2012   Vertigo     Past Surgical History:  Procedure Laterality Date   BIV UPGRADE N/A 11/25/2018   Procedure: BIV UPGRADE;  Surgeon: Thompson Grayer, MD;  Location: [place] CV LAB;  Service: Cardiovascular;  Laterality: N/A;   cardiac bypass  1996   3 vessels   CATARACT EXTRACTION W/ INTRAOCULAR LENS  IMPLANT, BILATERAL Bilateral 2010   COLONOSCOPY     CORONARY ARTERY BYPASS GRAFT     CORONARY ATHERECTOMY N/A 05/23/2018   Procedure: CORONARY ATHERECTOMY;  Surgeon: [name], Peter M, MD;  Location: [place] CV LAB;  Service: Cardiovascular;  Laterality: N/A;   CORONARY STENT INTERVENTION N/A 05/23/2018   Procedure: CORONARY STENT INTERVENTION;  Surgeon: [name], Peter M, MD;  Location: [place] CV [facility];  Service: Cardiovascular;  Laterality: N/A;   INGUINAL HERNIA REPAIR Left 2010   LUMBAR LAMINECTOMY/DECOMPRESSION MICRODISCECTOMY Right 07/19/2016   Procedure: Right Lumbar Four-Five Hemilaminectomy and Bilateral Lumbar Two-Three Laminectomy;  Surgeon: Eustace Moore, MD;  Location: [place];  Service: Neurosurgery;  Laterality: Right;  Right Lumbar Four-Five Hemilaminectomy and Bilateral Lumbar Two-Three Laminectomy   LUMBAR SPINE SURGERY  2006   NASAL SEPTUM SURGERY  09/01/13   Dr. Adriana Reams    PACEMAKER INSERTION  10-16-2012   STJ Accent dual chamber pacemaker implanted by Dr [name] for symptomatic Mobitz II heart block   PERMANENT PACEMAKER INSERTION N/A 10/16/2012   Procedure: PERMANENT PACEMAKER INSERTION;  Surgeon: Thompson Grayer, MD;  Location: [facility] C[facility] LAB;  Service: Cardiovascular;  Laterality: N/A;   RIGHT/LEFT HEART CATH AND CORONARY/GRAFT ANGIOGRAPHY N/A 05/20/2018   Procedure: RIGHT/LEFT HEART CATH AND CORONARY/GRAFT ANGIOGRAPHY;  Surgeon: [name], Peter M, MD;  Location: [place] CV LAB;  Service: Cardiovascular;  Laterality: N/A;    Social History   Socioeconomic History   Marital status: Widowed    Spouse name: Not on file   Number of children: Not on file   Years of education: Not on file   Highest education level: Not on file  Occupational History   Not on file  Tobacco Use   Smoking status: Former    Packs/day: 2.00    Years: 40.00    Pack years: 80.00    Types: Cigarettes    Quit date: 04/02/1994    Years since quitting: 26.9   Smokeless tobacco: Never  Vaping Use   Vaping Use: Never used  Substance and Sexual Activity   Alcohol use: Not Currently    Alcohol/week: 0.0 standard drinks   Drug use: No   Sexual activity: Not Currently  Other Topics Concern   Not on file  Social History Narrative   Not on file   Social Determinants of Health   Financial Resource Strain: Not on file  Food Insecurity: Not on file  Transportation Needs: Not on file  Physical Activity: Not on file  Stress: Not on file  Social Connections: Not on file  Intimate Partner Violence: Not on file        Objective:    BP 113/70   Pulse 72   Temp (!) 97.3 F (36.3 C) (Temporal)   Ht _0  (1.727 m)   Wt 160 lb 12.8 oz (72.9 kg)   SpO2 96%   BMI 24.45 kg/m   Wt Readings from Last 3 Encounters:  03/14/21 160 lb 12.8 oz (72.9 kg)  01/12/21 164 lb (74.4 kg)  12/01/20 167 lb 12.8 oz (76.1 kg)    Physical Exam Vitals reviewed.  Constitutional:      General: He  is not in acute distress.    Appearance: Normal appearance. He is normal weight. He is not ill-appearing, toxic-appearing or diaphoretic.  HENT:     Head: Normocephalic and atraumatic.  Eyes:  General: No scleral icterus.       Right eye: No discharge.        Left eye: No discharge.     Conjunctiva/sclera: Conjunctivae normal.  Cardiovascular:     Rate and Rhythm: Normal rate and regular rhythm.     Heart sounds: Normal heart sounds. No murmur heard.   No friction rub. No gallop.  Pulmonary:     Effort: Pulmonary effort is normal. No respiratory distress.     Breath sounds: Normal breath sounds. No stridor. No wheezing, rhonchi or rales.  Musculoskeletal:        General: Normal range of motion.     Cervical back: Normal range of motion.  Skin:    General: Skin is warm and dry.  Neurological:     Mental Status: He is alert and oriented to person, place, and time. Mental status is at baseline.  Psychiatric:        Mood and Affect: Mood normal.        Behavior: Behavior normal.        Thought Content: Thought content normal.        Judgment: Judgment normal.    Lab Results  Component Value Date   TSH 3.550 08/04/2020   Lab Results  Component Value Date   WBC 6.3 12/01/2020   HGB 16.6 12/01/2020   HCT 48.3 12/01/2020   MCV 96 12/01/2020   PLT 166 12/01/2020   Lab Results  Component Value Date   NA 140 12/01/2020   K 4.3 12/01/2020   CO2 24 12/01/2020   GLUCOSE 148 (H) 12/01/2020   BUN 25 12/01/2020   CREATININE 1.53 (H) 12/01/2020   BILITOT 0.6 12/01/2020   ALKPHOS 56 12/01/2020   AST 20 12/01/2020   ALT 16 12/01/2020   PROT 6.2 12/01/2020   ALBUMIN 5.0 (H) 12/01/2020   CALCIUM 9.7 12/01/2020   ANIONGAP 7 08/04/2020   EGFR 45 (L) 12/01/2020   Lab Results  Component Value Date   CHOL 149 12/01/2020   Lab Results  Component Value Date   HDL 32 (L) 12/01/2020   Lab Results  Component Value Date   LDLCALC 78 12/01/2020   Lab Results  Component Value  Date   TRIG 236 (H) 12/01/2020   Lab Results  Component Value Date   CHOLHDL 4.7 12/01/2020   Lab Results  Component Value Date   HGBA1C 6.0 12/01/2020

## 2021-03-14 NOTE — Progress Notes (Signed)
Chronic Care Management Pharmacy Note  03/14/2021 Name:  Ricardo Hunt MRN:  [id number] DOB:  1941-02-22  Summary: T2DM, CKD, HLD  Recommendations/Changes made from today's visit:  Diabetes: Controlled-A1C 5.7%, GFR 54 (improved); Current treatment: FARXIGA; TRULICITY 0.53ZJ WEEKLY CONTINUE TRULICITY 6.73AL SQ WEEKLY Denies personal and family history of Medullary thyroid cancer ([place]) Re-enrollment faxed to [place] for [phone] for trulicity DAUGHTER WORK NUMBER [phone] WILL HAVE TRULICITY SHIPPED HERE--PT NOT HOME A LOT NEED TO CONTACT DAUGHTER ([place] NUMBER IS HER HOME PHONE) CONTINUE FARXIGA 74m daily Enrollment faxed today to az&me patient assistance  Medication to ship to patient's home per program request Medication is being used for T2DM and CKD3a--patient tolerating well; GFR has improved since use Current glucose readings: fasting glucose: <135, post prandial glucose: N/A Denies hypoglycemic/hyperglycemic symptoms Discussed meal planning options and Plate method for healthy eating Avoid sugary drinks and desserts Incorporate balanced protein, non starchy veggies, 1 serving of carbohydrate with each meal Increase water intake Increase physical activity as able Current exercise: N/A Recommended  CONTINUE SGLT2 (FARXIGA) & TRULICITY (GLP1) Assessed patient finances. [phone]--[facility] [place] [facility]; Enrollment faxed today to AZ&me patient assistance (free farxiga)--will ship to patient's home; [facility] the [facility] that az&me patient assistance uses   Hyperlipidemia -Almost at goal--discussed heart healthy diet and continuing to take medications as prescribed; increase fiber; decrease sweets and animal fats -add fish oil for triglycerides;diet control as well -Continue rosuvastatin 259mdaily Lipid  Panel     Component Value Date/Time   CHOL 149 12/01/2020 0904   TRIG 236 (H) 12/01/2020 0904   HDL 32 (L) 12/01/2020 0904   CHOLHDL 4.7 12/01/2020 0904   CHOLHDL 4.4 08/04/2020 0904   VLDL 38 08/04/2020 0904   LDLCALC 78 12/01/2020 0904   LABVLDL 39 12/01/2020 0904    Patient Goals/Self-Care Activities Over the next 90 days, patient will:  - take medications as prescribed collaborate with provider on medication access solutions  Follow Up Plan: Telephone follow up appointment with care management team member scheduled for: 04/11/21  Subjective: Ricardo O ZIDANE RENNERs an [age].o. year old male who is a primary patient of JoLoman BrooklynFNP.  The CCM team was consulted for assistance with disease management and care coordination needs.    Engaged with patient by telephone for follow up visit in response to provider referral for pharmacy case management and/or care coordination services.   Consent to Services:  The patient was given information about Chronic Care Management services, agreed to services, and gave verbal consent prior to initiation of services.  Please see initial visit note for detailed documentation.   Patient Care Team: JoLoman BrooklynFNP as PCP - General (Family Medicine) AlThompson GrayerMD as Consulting Physician (Cardiology) McLarey DresserMD as Consulting Physician (Cardiology) PrLavera GuiseRPB[facility] Scott [facility] Pharmacist (Family Medicine)   Objective:  Lab Results  Component Value Date   CREATININE 1.53 (H) 12/01/2020   CREATININE 1.32 (H) 08/04/2020   CREATININE 1.26 (H) 02/15/2020    Lab Results  Component Value Date   HGBA1C 6.0 12/01/2020   Last diabetic Eye exam: No results found for: HMDIABEYEEXA  Last diabetic Foot exam: No results found for: HMDIABFOOTEX      Component Value Date/Time   CHOL 149 12/01/2020 0904   TRIG 236 (H) 12/01/2020 092683  HDL 32 (L) 12/01/2020 0904   CHOLHDL 4.7 12/01/2020 0904   CHOLHDL 4.4 08/04/2020 0904   VLDL 38  08/04/2020 0904   LDLCALC 78 12/01/2020 0904    Hepatic Function Latest Ref Rng & Units 12/01/2020 08/04/2020 01/12/2007  Total Protein 6.0 - 8.5 g/dL 6.2 5.9(L) 6.1  Albumin 3.6 - 4.6 g/dL 5.0(H) 4.0 3.7  AST 0 - 40 IU/L _0 ALT 0 - 44 IU/L _1 Alk Phosphatase 44 - 121 IU/L 56 52 49  Total Bilirubin 0.0 - 1.2 mg/dL 0.6 0.8 0.7    Lab Results  Component Value Date/Time   TSH 3.550 08/04/2020 09:04 AM   TSH 1.104 10/03/2012 10:18 PM   FREET4 1.16 10/04/2012 10:45 AM    CBC Latest Ref Rng & Units 12/01/2020 08/04/2020 09/07/2019  WBC 3.4 - 10.8 x10E3/uL 6.3 7.3 7.1  Hemoglobin 13.0 - 17.7 g/dL 16.6 15.8 15.5  Hematocrit 37.5 - 51.0 % 48.3 47.8 47.8  Platelets 150 - 450 x10E3/uL 166 160 179    No results found for: VD25OH  Clinical ASCVD: No  The ASCVD Risk score (Arnett DK, et al., 2019) failed to calculate for the following reasons:   The 2019 ASCVD risk score is only valid for ages 85 to 37    Other: (CHADS2VASc if Afib, PHQ9 if depression, MMRC or CAT for COPD, ACT, DEXA)  Social History   Tobacco Use  Smoking Status Former   Packs/day: 2.00   Years: 40.00   Pack years: 80.00   Types: Cigarettes   Quit date: 04/02/1994   Years since quitting: 26.9  Smokeless Tobacco Never   BP Readings from Last 3 Encounters:  03/14/21 113/70  01/12/21 132/82  12/01/20 138/77   Pulse Readings from Last 3 Encounters:  03/14/21 72  01/12/21 80  12/01/20 67   Wt Readings from Last 3 Encounters:  03/14/21 160 lb 12.8 oz (72.9 kg)  01/12/21 164 lb (74.4 kg)  12/01/20 167 lb 12.8 oz (76.1 kg)    Assessment: Review of patient past medical history, allergies, medications, health status, including review of consultants reports, laboratory and other test data, was performed as part of comprehensive evaluation and provision of chronic care management services.   SDOH:  (Social Determinants of Health) assessments and interventions performed:    CCM Care Plan  No Known  Allergies  Medications Reviewed Today     Reviewed by Lavera Guise, [facility] (Pharmacist) on 03/14/21 at 0931  Med List Status: <None>   Medication Order Taking? Sig Documenting Provider Last Dose Status Informant  ACCU-CHEK GUIDE test strip [id number] No CHECK BS FOUR TIMES A DAY BEFORE MEALS AT BEDTIME Dx E11.65 Loman Brooklyn, FNP Taking Active   Accu-Chek Softclix Lancets lancets [id number] No CHECK BS FOUR TIMES A DAY BEFORE MEALS AT BEDTIME Dx E11.65 Loman Brooklyn, FNP Taking Active   carvedilol (COREG) 6.25 MG tablet [id number] No TAKE 1 TABLET 2 TIMES A DAY WITH A MEAL Verta Ellen., NP Taking Active   Discontinued 03/14/21 [phone] (Reorder)            Med Note Lavera Guise   Tue Mar 14, 2021  9:31 AM) VIA AZ&ME PATIENT ASSISTANCE PROGRAM  digoxin (LANOXIN) 0.125 MG tablet [id number] No TAKE 1 TABLET DAILY Verta Ellen., NP Taking Active   Dulaglutide (TRULICITY) 4.82 NO/0.3BC SOPN [id number] No Inject 0.75 mg into the skin once a week. Loman Brooklyn, FNP Taking Active  Med Note Lavera Guise   Wed Jan 25, 2021  9:33 AM) Via [facility] patient assistance program  ELIQUIS 5 MG TABS tablet [id number] No TAKE  (1)  TABLET TWICE A DAY. Larey Dresser, MD Taking Active   ENTRESTO 24-26 MG [id number] No TAKE (1) TABLET TWICE A DAY. Larey Dresser, MD Taking Active   furosemide (LASIX) 40 MG tablet [id number] No TAKE 1 TABLET EVERY MORNING AND 1/2 EVERY AFTERNOON Larey Dresser, MD Taking Active   Insulin Pen Needle (TRUEPLUS PEN NEEDLES) 31G X 5 MM MISC [id number] No Use five times daily with insulin Dx E11.65 Loman Brooklyn, FNP Taking Active   rosuvastatin (CRESTOR) 20 MG tablet [id number] No TAKE 1 TABLET AT Verlee Rossetti, Lavetta Nielsen., NP Taking Active   spironolactone (ALDACTONE) 25 MG tablet [id number] No Take 1 tablet (25 mg total) by mouth daily. Needs appt Conrad De Witt, NP Taking Active             Patient Active Problem List   Diagnosis Date Noted    Type 2 diabetes mellitus with hyperglycemia, without long-term current use of insulin ([place]) 06/07/2019   GERD (gastroesophageal reflux disease)    Atrial fibrillation and flutter (HCC)    CKD (chronic kidney disease), stage III (HCC)    Chronic systolic CHF (congestive heart failure) ([place]) 05/20/2018   S/P lumbar laminectomy 07/19/2016   Personal history of colonic polyps-tubular adenomas 08/31/2013   Complete heart block (HCC) s/p Pacemaker in 2014 10/15/2012   S/P CABG (coronary artery bypass graft) 10/06/2012   Coronary artery disease involving native coronary artery of native heart with angina pectoris ([place]) 10/03/2012   Essential hypertension 10/03/2012   Hyperlipidemia 10/03/2012    Immunization History  Administered Date(s) Administered   Fluad Quad(high Dose 65+) 01/12/2021   Influenza, High Dose Seasonal PF 01/30/2017, 02/18/2018   Influenza,inj,Quad PF,6+ Mos 01/30/2017   Influenza,inj,quad, With Preservative 12/30/2018   Influenza-Unspecified 01/31/2018, 01/20/2020   Moderna SARS-COV2 Booster Vaccination 02/05/2020   Moderna Sars-Covid-2 Vaccination 05/06/2019, 06/03/2019   Pneumococcal Conjugate-13 03/22/2017   Pneumococcal Polysaccharide-23 06/22/2015   Tdap 06/23/2014   Zoster, Live 01/20/2013    Conditions to be addressed/monitored: DMII and CKD Stage 3A; HLD  Care Plan : PHARMD MEDICATION MANAGEMENT  Updates made by Lavera Guise, [place] since 03/14/2021 12:00 AM     Problem: DISEASE PROGRESSION PREVENTION      Long-Range Goal: T2DM, HLD   Recent Progress: On track  Priority: High  Note:   Current Barriers:  Unable to independently afford treatment regimen Suboptimal therapeutic regimen for T2DM  Pharmacist Clinical Goal(s):  Over the next 90 days, patient will verbalize ability to afford treatment regimen maintain control of T2DM as evidenced by [place], ADDITIONAL HEALTH BENEFITS/PROTECTION  through collaboration with PharmD and  provider.    Interventions: 1:1 collaboration with Loman Brooklyn, FNP regarding development and update of comprehensive plan of care as evidenced by provider attestation and co-signature Inter-disciplinary care team collaboration (see longitudinal plan of care) Comprehensive medication review performed; medication list updated in electronic medical record  Diabetes: Controlled-A1C 5.7%, GFR 54 (improved); Current treatment: FARXIGA; TRULICITY 8.91QX WEEKLY CONTINUE TRULICITY 4.50TU SQ WEEKLY Denies personal and family history of Medullary thyroid cancer ([place]) Re-enrollment faxed to [place] for [phone] for trulicity DAUGHTER WORK NUMBER [phone] WILL HAVE TRULICITY SHIPPED HERE--PT NOT HOME A LOT NEED TO CONTACT DAUGHTER ([place] PHONE) CONTINUE FARXIGA 74m daily Enrollment faxed today to  az&me patient assistance  Medication to ship to patient's home per program request Medication is being used for T2DM and CKD3a--patient tolerating well; GFR has improved since use Current glucose readings: fasting glucose: <135, post prandial glucose: N/A Denies hypoglycemic/hyperglycemic symptoms Discussed meal planning options and Plate method for healthy eating Avoid sugary drinks and desserts Incorporate balanced protein, non starchy veggies, 1 serving of carbohydrate with each meal Increase water intake Increase physical activity as able Current exercise: N/A Recommended  CONTINUE SGLT2 (FARXIGA) & TRULICITY (GLP1) Assessed patient finances. Re-enrollment faxed to [place] for [phone] for Trulicity; Enrollment faxed today to AZ&me patient assistance (free farxiga)--will ship to patient's home; [facility]ills [facility] which is the pharmacy that az&me patient assistance uses   Hyperlipidemia -Almost at goal--discussed heart healthy diet and continuing to take medications as prescribed; increase fiber; decrease sweets and animal fats -add fish oil for triglycerides;diet  control as well -Continue rosuvastatin 61m daily Lipid Panel     Component Value Date/Time   CHOL 149 12/01/2020 0904   TRIG 236 (H) 12/01/2020 0904   HDL 32 (L) 12/01/2020 0904   CHOLHDL 4.7 12/01/2020 0904   CHOLHDL 4.4 08/04/2020 0904   VLDL 38 08/04/2020 0904   LDLCALC 78 12/01/2020 0904   LABVLDL 39 12/01/2020 0904   Patient Goals/Self-Care Activities Over the next 90 days, patient will:  - take medications as prescribed collaborate with provider on medication access solutions  Follow Up Plan: Telephone follow up appointment with care management team member scheduled for: 04/11/21      Medication Assistance:  [place] FOR 2023  --WILL SHIP TO PCP OFFICE  [place] PAP FOR FARXIGA--WILL SHIP TO HOME  Patient's preferred pharmacy is:  [place] [address]Phone: [phone]: [phone] [facility] ([place] - [place] C[facility]burg[address]Phone: [phone]: [phone] [facility] [place]- [address] [place]ray[address]Phone: [phone]: [address] [address]Phone: [phone]: [phone] Uses pill box? No - N/A Pt endorses 100% compliance  Follow Up:  Patient agrees to Care Plan and Follow-up.  Plan: Telephone follow up appointment with care management team member scheduled for:  04/11/2021   JRegina Eck PharmD, BCPS Clinical Pharmacist, [place] Phone [phone]

## 2021-03-14 NOTE — Patient Instructions (Addendum)
We will call you with your eye exam appointment - I am waiting to hear back from the lady that schedules these.  Delene Loll requires him to bring me the first two pages of his income tax returns for most current year.

## 2021-03-14 NOTE — Patient Instructions (Addendum)
Visit Information  Thank you for taking time to visit with me today. Please don't hesitate to contact me if I can be of assistance to you before our next scheduled telephone appointment.  Following are the goals we discussed today:  Current Barriers:  Unable to independently afford treatment regimen Suboptimal therapeutic regimen for T2DM  Pharmacist Clinical Goal(s):  Over the next 90 days, patient will verbalize ability to afford treatment regimen maintain control of T2DM as evidenced by [place], ADDITIONAL HEALTH BENEFITS/PROTECTION  through collaboration with PharmD and provider.    Interventions: 1:1 collaboration with Loman Brooklyn, FNP regarding development and update of comprehensive plan of care as evidenced by provider attestation and co-signature Inter-disciplinary care team collaboration (see longitudinal plan of care) Comprehensive medication review performed; medication list updated in electronic medical record  Diabetes: Controlled-A1C 5.7%, GFR 54 (improved); Current treatment: FARXIGA; TRULICITY 0.75MG  WEEKLY CONTINUE TRULICITY 0.75MG  SQ WEEKLY Denies personal and family history of Medullary thyroid cancer ([place]) Re-enrollment faxed to [place] for 5784 for trulicity DAUGHTER WORK NUMBER [phone] WILL HAVE TRULICITY SHIPPED HERE--PT NOT HOME A LOT NEED TO CONTACT DAUGHTER ([place] NUMBER IS HER HOME PHONE) CONTINUE FARXIGA 10mg  daily Enrollment faxed today to az&me patient assistance  Medication to ship to patient's home per program request Medication is being used for T2DM and CKD3a--patient tolerating well; GFR has improved since use Current glucose readings: fasting glucose: <135, post prandial glucose: N/A Denies hypoglycemic/hyperglycemic symptoms Discussed meal planning options and Plate method for healthy eating Avoid sugary drinks and desserts Incorporate balanced protein, non starchy veggies, 1 serving of carbohydrate with each  meal Increase water intake Increase physical activity as able Current exercise: N/A Recommended  CONTINUE SGLT2 (FARXIGA) & TRULICITY (GLP1) Assessed patient finances. Re-enrollment faxed to [place] for [phone] for Trulicity; Enrollment faxed today to AZ&me patient assistance (free farxiga)--will ship to patient's home; refills [facility] which is the pharmacy that az&me patient assistance uses   Hyperlipidemia -Almost at goal--discussed heart healthy diet and continuing to take medications as prescribed; increase fiber; decrease sweets and animal fats -add fish oil for triglycerides;diet control as well -Continue rosuvastatin 20mg  daily Lipid Panel     Component Value Date/Time   CHOL 149 12/01/2020 0904   TRIG 236 (H) 12/01/2020 0904   HDL 32 (L) 12/01/2020 0904   CHOLHDL 4.7 12/01/2020 0904   CHOLHDL 4.4 08/04/2020 0904   VLDL 38 08/04/2020 0904   LDLCALC 78 12/01/2020 0904   LABVLDL 39 12/01/2020 0904    Patient Goals/Self-Care Activities Over the next 90 days, patient will:  - take medications as prescribed collaborate with provider on medication access solutions  Follow Up Plan: Telephone follow up appointment with care management team member scheduled for: 04/11/21   Our next appointment is by telephone on 04/11/21  Please call the care guide team at [phone] if you need to cancel or reschedule your appointment.   The patient verbalized understanding of instructions, educational materials, and care plan provided today and declined offer to receive copy of patient instructions, educational materials, and care plan.   Regina Eck, PharmD, BCPS Clinical Pharmacist, [place]  II Phone [phone]

## 2021-04-02 DEATH — deceased

## 2021-04-11 ENCOUNTER — Telehealth: Payer: Medicare Other

## 2021-07-13 ENCOUNTER — Encounter: Payer: Medicare Other | Admitting: Family Medicine
